# Patient Record
Sex: Female | Born: 1960 | Race: White | Hispanic: No | Smoking: Current every day smoker
Health system: Southern US, Community
[De-identification: ages and names within clinical notes are randomized; demographics above are authoritative.]

## PROBLEM LIST (undated history)

## (undated) DIAGNOSIS — M1990 Unspecified osteoarthritis, unspecified site: Secondary | ICD-10-CM

## (undated) DIAGNOSIS — R0600 Dyspnea, unspecified: Secondary | ICD-10-CM

## (undated) DIAGNOSIS — M25569 Pain in unspecified knee: Secondary | ICD-10-CM

## (undated) DIAGNOSIS — M5416 Radiculopathy, lumbar region: Secondary | ICD-10-CM

## (undated) DIAGNOSIS — F319 Bipolar disorder, unspecified: Secondary | ICD-10-CM

## (undated) DIAGNOSIS — G8929 Other chronic pain: Secondary | ICD-10-CM

## (undated) DIAGNOSIS — F329 Major depressive disorder, single episode, unspecified: Secondary | ICD-10-CM

## (undated) DIAGNOSIS — I10 Essential (primary) hypertension: Secondary | ICD-10-CM

## (undated) DIAGNOSIS — Z862 Personal history of diseases of the blood and blood-forming organs and certain disorders involving the immune mechanism: Secondary | ICD-10-CM

## (undated) DIAGNOSIS — F32A Depression, unspecified: Secondary | ICD-10-CM

## (undated) DIAGNOSIS — M549 Dorsalgia, unspecified: Secondary | ICD-10-CM

## (undated) DIAGNOSIS — K219 Gastro-esophageal reflux disease without esophagitis: Secondary | ICD-10-CM

## (undated) DIAGNOSIS — G4730 Sleep apnea, unspecified: Secondary | ICD-10-CM

## (undated) DIAGNOSIS — F419 Anxiety disorder, unspecified: Secondary | ICD-10-CM

## (undated) DIAGNOSIS — J449 Chronic obstructive pulmonary disease, unspecified: Secondary | ICD-10-CM

## (undated) DIAGNOSIS — Z9981 Dependence on supplemental oxygen: Secondary | ICD-10-CM

## (undated) HISTORY — PX: TUBAL LIGATION: SHX77

## (undated) HISTORY — PX: DILATION AND CURETTAGE OF UTERUS: SHX78

---

## 1998-09-20 ENCOUNTER — Ambulatory Visit (HOSPITAL_COMMUNITY)
Admission: RE | Admit: 1998-09-20 | Discharge: 1998-09-20 | Payer: Self-pay | Admitting: Physical Medicine and Rehabilitation

## 1998-09-20 ENCOUNTER — Encounter: Payer: Self-pay | Admitting: Physical Medicine and Rehabilitation

## 1998-10-12 ENCOUNTER — Encounter
Admission: RE | Admit: 1998-10-12 | Discharge: 1998-12-01 | Payer: Self-pay | Admitting: Physical Medicine and Rehabilitation

## 2000-07-03 ENCOUNTER — Encounter: Admission: RE | Admit: 2000-07-03 | Discharge: 2000-07-03 | Payer: Self-pay | Admitting: Family Medicine

## 2000-07-03 ENCOUNTER — Encounter: Payer: Self-pay | Admitting: Family Medicine

## 2001-05-08 ENCOUNTER — Encounter: Payer: Self-pay | Admitting: Family Medicine

## 2001-05-08 ENCOUNTER — Ambulatory Visit (HOSPITAL_COMMUNITY): Admission: RE | Admit: 2001-05-08 | Discharge: 2001-05-08 | Payer: Self-pay | Admitting: Family Medicine

## 2008-02-15 ENCOUNTER — Emergency Department (HOSPITAL_COMMUNITY): Admission: EM | Admit: 2008-02-15 | Discharge: 2008-02-15 | Payer: Self-pay | Admitting: Emergency Medicine

## 2008-08-29 ENCOUNTER — Ambulatory Visit: Payer: Self-pay | Admitting: *Deleted

## 2008-08-29 ENCOUNTER — Encounter: Payer: Self-pay | Admitting: Emergency Medicine

## 2008-08-30 ENCOUNTER — Inpatient Hospital Stay (HOSPITAL_COMMUNITY): Admission: RE | Admit: 2008-08-30 | Discharge: 2008-08-31 | Payer: Self-pay | Admitting: *Deleted

## 2008-12-01 ENCOUNTER — Emergency Department (HOSPITAL_COMMUNITY): Admission: EM | Admit: 2008-12-01 | Discharge: 2008-12-01 | Payer: Self-pay | Admitting: Emergency Medicine

## 2009-11-14 ENCOUNTER — Other Ambulatory Visit: Admission: RE | Admit: 2009-11-14 | Discharge: 2009-11-14 | Payer: Self-pay | Admitting: Unknown Physician Specialty

## 2010-01-18 ENCOUNTER — Emergency Department (HOSPITAL_COMMUNITY): Admission: EM | Admit: 2010-01-18 | Discharge: 2010-01-19 | Payer: Self-pay | Admitting: Emergency Medicine

## 2010-01-19 ENCOUNTER — Ambulatory Visit: Payer: Self-pay | Admitting: Psychiatry

## 2010-01-19 ENCOUNTER — Inpatient Hospital Stay (HOSPITAL_COMMUNITY): Admission: AD | Admit: 2010-01-19 | Discharge: 2010-01-24 | Payer: Self-pay | Admitting: Psychiatry

## 2010-01-29 ENCOUNTER — Emergency Department (HOSPITAL_COMMUNITY): Admission: EM | Admit: 2010-01-29 | Discharge: 2010-01-29 | Payer: Self-pay | Admitting: Emergency Medicine

## 2010-01-30 ENCOUNTER — Emergency Department (HOSPITAL_COMMUNITY): Admission: EM | Admit: 2010-01-30 | Discharge: 2010-01-30 | Payer: Self-pay | Admitting: Emergency Medicine

## 2010-03-15 ENCOUNTER — Emergency Department (HOSPITAL_COMMUNITY): Admission: EM | Admit: 2010-03-15 | Discharge: 2010-03-15 | Payer: Self-pay | Admitting: Emergency Medicine

## 2010-06-28 ENCOUNTER — Emergency Department (HOSPITAL_COMMUNITY): Admission: EM | Admit: 2010-06-28 | Discharge: 2010-06-28 | Payer: Self-pay | Admitting: Emergency Medicine

## 2010-08-10 ENCOUNTER — Encounter
Admission: RE | Admit: 2010-08-10 | Discharge: 2010-08-10 | Payer: Self-pay | Source: Home / Self Care | Admitting: Physical Medicine and Rehabilitation

## 2010-09-05 ENCOUNTER — Emergency Department (HOSPITAL_COMMUNITY)
Admission: EM | Admit: 2010-09-05 | Discharge: 2010-09-06 | Payer: Self-pay | Source: Home / Self Care | Admitting: Emergency Medicine

## 2010-09-16 ENCOUNTER — Emergency Department (HOSPITAL_COMMUNITY)
Admission: EM | Admit: 2010-09-16 | Discharge: 2010-09-16 | Payer: Self-pay | Source: Home / Self Care | Admitting: Emergency Medicine

## 2010-10-30 ENCOUNTER — Emergency Department (HOSPITAL_COMMUNITY)
Admission: EM | Admit: 2010-10-30 | Discharge: 2010-10-30 | Disposition: A | Discharge status: Home / Self Care | Payer: Medicaid Other | Attending: Emergency Medicine | Admitting: Emergency Medicine

## 2010-10-30 DIAGNOSIS — M545 Low back pain, unspecified: Secondary | ICD-10-CM | POA: Insufficient documentation

## 2010-11-13 ENCOUNTER — Emergency Department (HOSPITAL_COMMUNITY)
Admission: EM | Admit: 2010-11-13 | Discharge: 2010-11-13 | Disposition: A | Discharge status: Home / Self Care | Payer: Medicaid Other | Attending: Emergency Medicine | Admitting: Emergency Medicine

## 2010-11-13 DIAGNOSIS — Z79899 Other long term (current) drug therapy: Secondary | ICD-10-CM | POA: Insufficient documentation

## 2010-11-13 DIAGNOSIS — M545 Low back pain, unspecified: Secondary | ICD-10-CM | POA: Insufficient documentation

## 2010-11-13 DIAGNOSIS — F341 Dysthymic disorder: Secondary | ICD-10-CM | POA: Insufficient documentation

## 2010-11-13 DIAGNOSIS — G8929 Other chronic pain: Secondary | ICD-10-CM | POA: Insufficient documentation

## 2010-11-13 DIAGNOSIS — M79609 Pain in unspecified limb: Secondary | ICD-10-CM | POA: Insufficient documentation

## 2010-11-25 LAB — COMPREHENSIVE METABOLIC PANEL
ALT: 21 U/L (ref 0–35)
AST: 18 U/L (ref 0–37)
Albumin: 4 g/dL (ref 3.5–5.2)
Alkaline Phosphatase: 72 U/L (ref 39–117)
BUN: 9 mg/dL (ref 6–23)
CO2: 29 mEq/L (ref 19–32)
Calcium: 9.4 mg/dL (ref 8.4–10.5)
Chloride: 102 mEq/L (ref 96–112)
Creatinine, Ser: 0.98 mg/dL (ref 0.4–1.2)
GFR calc Af Amer: >60 mL/min (ref >60)
GFR calc non Af Amer: >60 mL/min (ref >60)
Glucose, Bld: 90 mg/dL (ref 70–99)
Potassium: 4.1 mEq/L (ref 3.5–5.1)
Sodium: 137 mEq/L (ref 135–145)
Total Bilirubin: 0.6 mg/dL (ref 0.3–1.2)
Total Protein: 7 g/dL (ref 6.0–8.3)

## 2010-11-25 LAB — URINALYSIS, ROUTINE W REFLEX MICROSCOPIC
Bilirubin Urine: NEGATIVE
Glucose, UA: NEGATIVE mg/dL
Ketones, ur: NEGATIVE mg/dL
Leukocytes, UA: NEGATIVE
Nitrite: POSITIVE — AB
Protein, ur: 30 mg/dL — AB
Specific Gravity, Urine: 1.01 (ref 1.005–1.030)
Urobilinogen, UA: 0.2 mg/dL (ref 0.0–1.0)
pH: 6 (ref 5.0–8.0)

## 2010-11-25 LAB — URINE CULTURE: Colony Count: >=100000

## 2010-11-25 LAB — URINE MICROSCOPIC-ADD ON

## 2010-11-25 LAB — PREGNANCY, URINE: Preg Test, Ur: NEGATIVE

## 2010-11-27 LAB — URINALYSIS, ROUTINE W REFLEX MICROSCOPIC
Bilirubin Urine: NEGATIVE
Glucose, UA: NEGATIVE mg/dL
Hgb urine dipstick: NEGATIVE
Ketones, ur: NEGATIVE mg/dL
Nitrite: NEGATIVE
Protein, ur: NEGATIVE mg/dL
Specific Gravity, Urine: 1.025 (ref 1.005–1.030)
Urobilinogen, UA: 0.2 mg/dL (ref 0.0–1.0)
pH: 5 (ref 5.0–8.0)

## 2010-11-27 LAB — CBC
HCT: 41 % (ref 36.0–46.0)
Hemoglobin: 14.4 g/dL (ref 12.0–15.0)
MCHC: 35.2 g/dL (ref 30.0–36.0)
MCV: 89.8 fL (ref 78.0–100.0)
Platelets: 379 10*3/uL (ref 150–400)
RBC: 4.57 MIL/uL (ref 3.87–5.11)
RDW: 13.8 % (ref 11.5–15.5)
WBC: 11.2 10*3/uL — ABNORMAL HIGH (ref 4.0–10.5)

## 2010-11-27 LAB — DIFFERENTIAL
Basophils Absolute: 0.1 10*3/uL (ref 0.0–0.1)
Basophils Relative: 1 % (ref 0–1)
Eosinophils Absolute: 0.1 10*3/uL (ref 0.0–0.7)
Eosinophils Relative: 1 % (ref 0–5)
Lymphocytes Relative: 35 % (ref 12–46)
Lymphs Abs: 3.9 10*3/uL (ref 0.7–4.0)
Monocytes Absolute: 0.5 10*3/uL (ref 0.1–1.0)
Monocytes Relative: 4 % (ref 3–12)
Neutro Abs: 6.6 10*3/uL (ref 1.7–7.7)
Neutrophils Relative %: 59 % (ref 43–77)

## 2010-11-27 LAB — RAPID URINE DRUG SCREEN, HOSP PERFORMED
Amphetamines: NOT DETECTED
Barbiturates: NOT DETECTED
Benzodiazepines: POSITIVE — AB
Cocaine: POSITIVE — AB
Opiates: NOT DETECTED
Tetrahydrocannabinol: NOT DETECTED

## 2010-11-27 LAB — BASIC METABOLIC PANEL
BUN: 10 mg/dL (ref 6–23)
CO2: 23 mEq/L (ref 19–32)
Calcium: 9.6 mg/dL (ref 8.4–10.5)
Chloride: 108 mEq/L (ref 96–112)
Creatinine, Ser: 0.81 mg/dL (ref 0.4–1.2)
GFR calc Af Amer: >60 mL/min (ref >60)
GFR calc non Af Amer: >60 mL/min (ref >60)
Glucose, Bld: 119 mg/dL — ABNORMAL HIGH (ref 70–99)
Potassium: 3.5 mEq/L (ref 3.5–5.1)
Sodium: 139 mEq/L (ref 135–145)

## 2010-11-27 LAB — ETHANOL: Alcohol, Ethyl (B): 23 mg/dL — ABNORMAL HIGH (ref 0–10)

## 2010-11-27 LAB — POCT PREGNANCY, URINE: Preg Test, Ur: NEGATIVE
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
## 2011-01-29 ENCOUNTER — Emergency Department (HOSPITAL_COMMUNITY)
Admission: EM | Admit: 2011-01-29 | Discharge: 2011-01-29 | Disposition: A | Discharge status: Home / Self Care | Payer: Medicaid Other | Attending: Emergency Medicine | Admitting: Emergency Medicine

## 2011-01-29 DIAGNOSIS — R110 Nausea: Secondary | ICD-10-CM | POA: Insufficient documentation

## 2011-01-29 DIAGNOSIS — R109 Unspecified abdominal pain: Secondary | ICD-10-CM | POA: Insufficient documentation

## 2011-01-29 DIAGNOSIS — N390 Urinary tract infection, site not specified: Secondary | ICD-10-CM | POA: Insufficient documentation

## 2011-01-29 DIAGNOSIS — R51 Headache: Secondary | ICD-10-CM | POA: Insufficient documentation

## 2011-01-29 LAB — URINALYSIS, ROUTINE W REFLEX MICROSCOPIC
Bilirubin Urine: NEGATIVE
Glucose, UA: NEGATIVE mg/dL
Hgb urine dipstick: NEGATIVE
Leukocytes, UA: NEGATIVE
Nitrite: POSITIVE — AB
Protein, ur: NEGATIVE mg/dL
Specific Gravity, Urine: 1.02 (ref 1.005–1.030)
Urobilinogen, UA: 0.2 mg/dL (ref 0.0–1.0)
pH: 6 (ref 5.0–8.0)

## 2011-01-29 LAB — CBC
HCT: 39.8 % (ref 36.0–46.0)
Hemoglobin: 13.3 g/dL (ref 12.0–15.0)
MCH: 30 pg (ref 26.0–34.0)
MCHC: 33.4 g/dL (ref 30.0–36.0)
MCV: 89.8 fL (ref 78.0–100.0)
Platelets: 278 10*3/uL (ref 150–400)
RBC: 4.43 MIL/uL (ref 3.87–5.11)
RDW: 13.8 % (ref 11.5–15.5)
WBC: 9.6 10*3/uL (ref 4.0–10.5)

## 2011-01-29 LAB — COMPREHENSIVE METABOLIC PANEL
ALT: 23 U/L (ref 0–35)
AST: 17 U/L (ref 0–37)
Albumin: 3.8 g/dL (ref 3.5–5.2)
Alkaline Phosphatase: 79 U/L (ref 39–117)
BUN: 13 mg/dL (ref 6–23)
CO2: 29 mEq/L (ref 19–32)
Calcium: 10 mg/dL (ref 8.4–10.5)
Chloride: 100 mEq/L (ref 96–112)
Creatinine, Ser: 0.83 mg/dL (ref 0.4–1.2)
GFR calc Af Amer: >60 mL/min (ref >60)
GFR calc non Af Amer: >60 mL/min (ref >60)
Glucose, Bld: 86 mg/dL (ref 70–99)
Potassium: 3.6 mEq/L (ref 3.5–5.1)
Sodium: 137 mEq/L (ref 135–145)
Total Bilirubin: 0.2 mg/dL — ABNORMAL LOW (ref 0.3–1.2)
Total Protein: 6.9 g/dL (ref 6.0–8.3)

## 2011-01-29 LAB — DIFFERENTIAL
Basophils Absolute: 0 10*3/uL (ref 0.0–0.1)
Basophils Relative: 0 % (ref 0–1)
Eosinophils Absolute: 0.1 10*3/uL (ref 0.0–0.7)
Eosinophils Relative: 1 % (ref 0–5)
Lymphocytes Relative: 29 % (ref 12–46)
Lymphs Abs: 2.7 10*3/uL (ref 0.7–4.0)
Monocytes Absolute: 0.6 10*3/uL (ref 0.1–1.0)
Monocytes Relative: 7 % (ref 3–12)
Neutro Abs: 6.1 10*3/uL (ref 1.7–7.7)
Neutrophils Relative %: 64 % (ref 43–77)

## 2011-01-29 LAB — URINE MICROSCOPIC-ADD ON

## 2011-01-31 LAB — URINE CULTURE
Colony Count: 80000
Culture  Setup Time: 201205230623

## 2011-04-17 ENCOUNTER — Emergency Department (HOSPITAL_COMMUNITY)
Admission: EM | Admit: 2011-04-17 | Discharge: 2011-04-17 | Disposition: A | Discharge status: Home / Self Care | Payer: Medicaid Other | Attending: Emergency Medicine | Admitting: Emergency Medicine

## 2011-04-17 ENCOUNTER — Emergency Department (HOSPITAL_COMMUNITY): Payer: Medicaid Other

## 2011-04-17 ENCOUNTER — Encounter: Payer: Self-pay | Admitting: Emergency Medicine

## 2011-04-17 DIAGNOSIS — Z79899 Other long term (current) drug therapy: Secondary | ICD-10-CM | POA: Insufficient documentation

## 2011-04-17 DIAGNOSIS — F319 Bipolar disorder, unspecified: Secondary | ICD-10-CM | POA: Insufficient documentation

## 2011-04-17 DIAGNOSIS — I10 Essential (primary) hypertension: Secondary | ICD-10-CM | POA: Insufficient documentation

## 2011-04-17 DIAGNOSIS — M545 Low back pain, unspecified: Secondary | ICD-10-CM | POA: Insufficient documentation

## 2011-04-17 DIAGNOSIS — N3946 Mixed incontinence: Secondary | ICD-10-CM | POA: Insufficient documentation

## 2011-04-17 DIAGNOSIS — F341 Dysthymic disorder: Secondary | ICD-10-CM | POA: Insufficient documentation

## 2011-04-17 DIAGNOSIS — R209 Unspecified disturbances of skin sensation: Secondary | ICD-10-CM | POA: Insufficient documentation

## 2011-04-17 DIAGNOSIS — F17200 Nicotine dependence, unspecified, uncomplicated: Secondary | ICD-10-CM | POA: Insufficient documentation

## 2011-04-17 HISTORY — DX: Essential (primary) hypertension: I10

## 2011-04-17 HISTORY — DX: Anxiety disorder, unspecified: F41.9

## 2011-04-17 HISTORY — DX: Other chronic pain: G89.29

## 2011-04-17 HISTORY — DX: Dorsalgia, unspecified: M54.9

## 2011-04-17 HISTORY — DX: Bipolar disorder, unspecified: F31.9

## 2011-04-17 HISTORY — DX: Depression, unspecified: F32.A

## 2011-04-17 HISTORY — DX: Major depressive disorder, single episode, unspecified: F32.9

## 2011-04-17 LAB — URINALYSIS, ROUTINE W REFLEX MICROSCOPIC
Bilirubin Urine: NEGATIVE
Glucose, UA: NEGATIVE mg/dL
Hgb urine dipstick: NEGATIVE
Ketones, ur: NEGATIVE mg/dL
Leukocytes, UA: NEGATIVE
Nitrite: NEGATIVE
Protein, ur: NEGATIVE mg/dL
Specific Gravity, Urine: 1.01 (ref 1.005–1.030)
Urobilinogen, UA: 0.2 mg/dL (ref 0.0–1.0)
pH: 5.5 (ref 5.0–8.0)

## 2011-04-17 MED ORDER — METHOCARBAMOL 500 MG PO TABS: 500.0000 mg | ORAL_TABLET | Freq: Three times a day (TID) | ORAL | Status: AC

## 2011-04-17 MED ORDER — OXYCODONE-ACETAMINOPHEN 5-325 MG PO TABS
1.0000 | ORAL_TABLET | Freq: Once | ORAL | Status: AC
  Administered 2011-04-17: 1 via ORAL
  Filled 2011-04-17: qty 1

## 2011-04-17 MED ORDER — TRAMADOL HCL 50 MG PO TABS: ORAL_TABLET | ORAL | Status: DC

## 2011-04-17 NOTE — ED Notes (Signed)
Pt c/o lower back pain x 2 years. Pt here for pain control. Unable to see Dr. Gerilyn Pilgrim until Tuesday. nad noted.

## 2011-04-17 NOTE — ED Provider Notes (Signed)
History     CSN: [id number] Arrival date & time: 04/17/2011  3:56 PM  Chief Complaint  Patient presents with  . Back Pain   HPI Comments: Patient c/o chronic low back pain for several years.  States the pain has been increasing this week and she recently ran out of her pain medications.  States she has also been having episodes in bladder incontinence.  States she wakes in the early morning hrs with urge to void and sometimes is unable to get to the restroom in time.  She denies these sx's during the day.  Also denies recent injury, bowel incontinence or perineal numbness.    Patient is a 50 y.o. female presenting with back pain. The history is provided by the patient.  Back Pain  This is a chronic problem. The current episode started more than 1 week ago. The problem occurs constantly. The problem has been gradually worsening. The pain is associated with no known injury. The pain is present in the lumbar spine. The quality of the pain is described as aching. The pain does not radiate. The pain is moderate. The symptoms are aggravated by twisting and certain positions. The pain is the same all the time. Associated symptoms include numbness, bowel incontinence, bladder incontinence and tingling. Pertinent negatives include no chest pain, no fever, no abdominal pain, no abdominal swelling, no perianal numbness, no dysuria, no pelvic pain, no leg pain and no weakness. She has tried analgesics for the symptoms. The treatment provided mild relief.    Past Medical History  Diagnosis Date  . Chronic back pain   . Bipolar 1 disorder   . Anxiety   . Depression   . Hypertension     History reviewed. No pertinent past surgical history.  Family History  Problem Relation Age of Onset  . Hypertension Mother   . Hypertension Brother   . Anxiety disorder Brother   . Depression Brother     History  Substance Use Topics  . Smoking status: Current Everyday Smoker -- 0.5 packs/day  . Smokeless  tobacco: Not on file  . Alcohol Use: No    OB History    Grav Para Term Preterm Abortions TAB SAB Ect Mult Living   2 2 2       2       Review of Systems  Constitutional: Negative for fever.  HENT: Negative for neck pain and neck stiffness.   Cardiovascular: Negative for chest pain.  Gastrointestinal: Positive for bowel incontinence. Negative for abdominal pain.  Genitourinary: Positive for bladder incontinence. Negative for dysuria, decreased urine volume, vaginal pain and pelvic pain.  Musculoskeletal: Positive for back pain. Negative for myalgias and gait problem.  Skin: Negative for color change and wound.  Neurological: Positive for tingling and numbness. Negative for dizziness and weakness.  Hematological: Does not bruise/bleed easily.  All other systems reviewed and are negative.    Physical Exam  BP 157/91  Pulse 88  Temp 97.9 F (36.6 C)  Resp 18  Ht 5\' 3"  (1.6 m)  Wt 225 lb (102.059 kg)  BMI 39.86 kg/m2  SpO2 99%  LMP 04/10/2011  Physical Exam  Constitutional: She is oriented to person, place, and time. She appears well-developed and well-nourished. No distress.  HENT:  Head: Normocephalic and atraumatic.  Mouth/Throat: Oropharynx is clear and moist.  Eyes: Pupils are equal, round, and reactive to light.  Neck: Normal range of motion. Neck supple. No thyromegaly present.  Cardiovascular: Normal rate, regular rhythm and normal  heart sounds.   Pulmonary/Chest: Effort normal and breath sounds normal.  Abdominal: Soft. There is no tenderness. There is no rebound and no guarding.  Genitourinary: Rectal exam shows no mass, no tenderness and anal tone normal.  Musculoskeletal: She exhibits tenderness. She exhibits no edema.  Lymphadenopathy:    She has no cervical adenopathy.  Neurological: She is alert and oriented to person, place, and time. She has normal reflexes. She is not disoriented. She displays no atrophy and normal reflexes. No cranial nerve deficit or  sensory deficit. She exhibits normal muscle tone. Coordination and gait normal. GCS eye subscore is 4. GCS verbal subscore is 5. GCS motor subscore is 6.  Skin: Skin is warm and dry.    ED Course  Procedures  MDM   1840  Patient ambulated to the restroom w/o difficulty.  No focal neuro deficits on exam.  DTR's are nml. Rectal performed by me and perineal sensation intact, rectal tone is normal. ttp of the lumbar spine and  paraspinal muscles.  Has hx of chronic low back pain and has an appt with neurologist in 6 days.      Brook Geraci L. [place], [place] 04/22/11 2016

## 2011-05-01 NOTE — ED Provider Notes (Signed)
Medical screening examination/treatment/procedure(s) were performed by non-physician practitioner and as supervising physician I was immediately available for consultation/collaboration.  Jasmine Awe, MD 05/01/11 [phone]

## 2011-05-05 ENCOUNTER — Ambulatory Visit: Payer: Medicaid Other | Attending: Neurology

## 2011-05-05 DIAGNOSIS — Z6839 Body mass index (BMI) 39.0-39.9, adult: Secondary | ICD-10-CM | POA: Insufficient documentation

## 2011-05-05 DIAGNOSIS — G4733 Obstructive sleep apnea (adult) (pediatric): Secondary | ICD-10-CM | POA: Insufficient documentation

## 2011-05-05 NOTE — Progress Notes (Signed)
Subjective:     Patient ID: Tiffany Lynch, female   DOB: Jan 16, 1961, 50 y.o.   MRN: [id number]  HPI   Review of Systems     Objective:   Physical Exam     Assessment:     ***    Plan:     ***

## 2011-05-13 NOTE — Procedures (Signed)
Tiffany Lynch, Tiffany Lynch                ACCOUNT NO.:  [id number]  MEDICAL RECORD NO.:  [id number]          PATIENT TYPE:  OUT  LOCATION:  SLEEP LAB                     FACILITY:  [facility]  PHYSICIAN:  Javin Nong A. Gerilyn Pilgrim, M.D. DATE OF BIRTH:  [birth year]  DATE OF STUDY:  05/05/2011                           NOCTURNAL POLYSOMNOGRAM   REFERRING PHYSICIAN:  Beryle Beams, MD.  INDICATIONS:  50 year old presents with hypersomnia, fatigue and snoring.  This study has been done to evaluate for obstructive sleep apnea syndrome.  MEDICATIONS:  Invega, Wellbutrin, Cymbalta, Ultram, BuSpar, Xanax, ibuprofen, lisinopril, hydrochlorothiazide, Voltaren, Neurontin, trazodone.  EPWORTH SLEEPINESS SCORE:  16.  BMI:  39.  ARCHITECTURAL SUMMARY:  Total recording time is 497 minutes.  Sleep efficiency 80%.  Sleep latency 0.5 minutes.  REM latency zero.  Stage N1 10%, N2 90%, N3  0% and REM sleep 0%.  RESPIRATORY SUMMARY:  Baseline oxygen saturation 92, lowest saturation 71 during non REM sleep.  Diagnostic AH I 87, RDI 30.  The patient was started on oxygen because of none obstructing non-apneic desaturations. She was given 1.5 liters during the first hour of the recording.  LIMB MOVEMENT SUMMARY:  PLM index 5.6.  ELECTROCARDIOGRAM SUMMARY:  Average heart rate is 87 with no significant dysrhythmias observed.  IMPRESSION: 1. Mild obstructive sleep apnea syndrome not requiring positive     pressure. 2. Significant oxygen desaturations without obstructive events     suggestive underlying pulmonary process.  RECOMMENDATIONS:  Two liters nocturnal supplemental oxygen.     Yanisa Goodgame A. Gerilyn Pilgrim, M.D.    KAD/MEDQ  D:  05/13/2011 20:54:02  T:  05/13/2011 21:23:14  Job:  [id number]

## 2011-06-14 LAB — BASIC METABOLIC PANEL
BUN: 8 mg/dL (ref 6–23)
CO2: 25 mEq/L (ref 19–32)
Calcium: 9.1 mg/dL (ref 8.4–10.5)
Chloride: 107 mEq/L (ref 96–112)
Creatinine, Ser: 0.69 mg/dL (ref 0.4–1.2)
GFR calc Af Amer: >60 mL/min (ref >60)
GFR calc non Af Amer: >60 mL/min (ref >60)
Glucose, Bld: 98 mg/dL (ref 70–99)
Potassium: 3.8 mEq/L (ref 3.5–5.1)
Sodium: 140 mEq/L (ref 135–145)

## 2011-06-14 LAB — RAPID URINE DRUG SCREEN, HOSP PERFORMED
Amphetamines: NOT DETECTED
Barbiturates: NOT DETECTED
Benzodiazepines: POSITIVE — AB
Cocaine: NOT DETECTED
Opiates: NOT DETECTED
Tetrahydrocannabinol: POSITIVE — AB

## 2011-06-14 LAB — DIFFERENTIAL
Basophils Absolute: 0 10*3/uL (ref 0.0–0.1)
Basophils Relative: 0 % (ref 0–1)
Eosinophils Absolute: 0 10*3/uL (ref 0.0–0.7)
Eosinophils Relative: 1 % (ref 0–5)
Lymphocytes Relative: 23 % (ref 12–46)
Lymphs Abs: 2.2 10*3/uL (ref 0.7–4.0)
Monocytes Absolute: 0.5 10*3/uL (ref 0.1–1.0)
Monocytes Relative: 6 % (ref 3–12)
Neutro Abs: 7 10*3/uL (ref 1.7–7.7)
Neutrophils Relative %: 71 % (ref 43–77)

## 2011-06-14 LAB — CBC
HCT: 42.8 % (ref 36.0–46.0)
Hemoglobin: 14.2 g/dL (ref 12.0–15.0)
MCHC: 33.3 g/dL (ref 30.0–36.0)
MCV: 94.7 fL (ref 78.0–100.0)
Platelets: 359 10*3/uL (ref 150–400)
RBC: 4.52 MIL/uL (ref 3.87–5.11)
RDW: 14.3 % (ref 11.5–15.5)
WBC: 9.9 10*3/uL (ref 4.0–10.5)

## 2011-06-14 LAB — ETHANOL: Alcohol, Ethyl (B): <5 mg/dL (ref 0–10)

## 2011-07-24 ENCOUNTER — Other Ambulatory Visit: Payer: Self-pay | Admitting: Neurosurgery

## 2011-07-24 ENCOUNTER — Other Ambulatory Visit (HOSPITAL_COMMUNITY): Payer: Self-pay | Admitting: Family Medicine

## 2011-07-24 DIAGNOSIS — Z139 Encounter for screening, unspecified: Secondary | ICD-10-CM

## 2011-07-24 DIAGNOSIS — M549 Dorsalgia, unspecified: Secondary | ICD-10-CM

## 2011-07-29 ENCOUNTER — Ambulatory Visit (HOSPITAL_COMMUNITY): Payer: Medicaid Other

## 2011-07-30 ENCOUNTER — Ambulatory Visit (HOSPITAL_COMMUNITY)
Admission: RE | Admit: 2011-07-30 | Discharge: 2011-07-30 | Disposition: A | Discharge status: Home / Self Care | Payer: Medicaid Other | Source: Ambulatory Visit | Attending: Family Medicine | Admitting: Family Medicine

## 2011-07-30 DIAGNOSIS — Z1231 Encounter for screening mammogram for malignant neoplasm of breast: Secondary | ICD-10-CM | POA: Insufficient documentation

## 2011-07-30 DIAGNOSIS — Z139 Encounter for screening, unspecified: Secondary | ICD-10-CM

## 2011-08-07 ENCOUNTER — Ambulatory Visit
Admission: RE | Admit: 2011-08-07 | Discharge: 2011-08-07 | Disposition: A | Discharge status: Home / Self Care | Payer: Medicaid Other | Source: Ambulatory Visit | Attending: Neurosurgery | Admitting: Neurosurgery

## 2011-08-07 DIAGNOSIS — M549 Dorsalgia, unspecified: Secondary | ICD-10-CM

## 2011-08-12 ENCOUNTER — Other Ambulatory Visit: Payer: Self-pay | Admitting: Family Medicine

## 2011-08-12 DIAGNOSIS — R928 Other abnormal and inconclusive findings on diagnostic imaging of breast: Secondary | ICD-10-CM

## 2011-08-28 ENCOUNTER — Ambulatory Visit (HOSPITAL_COMMUNITY): Payer: Medicaid Other

## 2011-09-04 ENCOUNTER — Encounter (HOSPITAL_COMMUNITY): Payer: Medicaid Other

## 2011-09-18 ENCOUNTER — Other Ambulatory Visit: Payer: Self-pay | Admitting: Family Medicine

## 2011-09-18 ENCOUNTER — Ambulatory Visit (HOSPITAL_COMMUNITY)
Admission: RE | Admit: 2011-09-18 | Discharge: 2011-09-18 | Disposition: A | Discharge status: Home / Self Care | Payer: Medicaid Other | Source: Ambulatory Visit | Attending: Family Medicine | Admitting: Family Medicine

## 2011-09-18 DIAGNOSIS — R928 Other abnormal and inconclusive findings on diagnostic imaging of breast: Secondary | ICD-10-CM

## 2011-09-18 DIAGNOSIS — L723 Sebaceous cyst: Secondary | ICD-10-CM | POA: Insufficient documentation

## 2011-11-29 ENCOUNTER — Emergency Department (HOSPITAL_COMMUNITY)
Admission: EM | Admit: 2011-11-29 | Discharge: 2011-11-30 | Disposition: A | Discharge status: Home / Self Care | Payer: Medicaid Other | Attending: Emergency Medicine | Admitting: Emergency Medicine

## 2011-11-29 ENCOUNTER — Encounter (HOSPITAL_COMMUNITY): Payer: Self-pay | Admitting: *Deleted

## 2011-11-29 DIAGNOSIS — R296 Repeated falls: Secondary | ICD-10-CM | POA: Insufficient documentation

## 2011-11-29 DIAGNOSIS — M542 Cervicalgia: Secondary | ICD-10-CM | POA: Insufficient documentation

## 2011-11-29 DIAGNOSIS — I10 Essential (primary) hypertension: Secondary | ICD-10-CM | POA: Insufficient documentation

## 2011-11-29 DIAGNOSIS — S139XXA Sprain of joints and ligaments of unspecified parts of neck, initial encounter: Secondary | ICD-10-CM | POA: Insufficient documentation

## 2011-11-29 DIAGNOSIS — F17200 Nicotine dependence, unspecified, uncomplicated: Secondary | ICD-10-CM | POA: Insufficient documentation

## 2011-11-29 DIAGNOSIS — Z79899 Other long term (current) drug therapy: Secondary | ICD-10-CM | POA: Insufficient documentation

## 2011-11-29 DIAGNOSIS — S161XXA Strain of muscle, fascia and tendon at neck level, initial encounter: Secondary | ICD-10-CM

## 2011-11-29 DIAGNOSIS — S60219A Contusion of unspecified wrist, initial encounter: Secondary | ICD-10-CM | POA: Insufficient documentation

## 2011-11-29 DIAGNOSIS — F3130 Bipolar disorder, current episode depressed, mild or moderate severity, unspecified: Secondary | ICD-10-CM | POA: Insufficient documentation
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
## 2011-11-30 ENCOUNTER — Emergency Department (HOSPITAL_COMMUNITY): Payer: Medicaid Other

## 2011-11-30 MED ORDER — ACETAMINOPHEN 325 MG PO TABS
650.0000 mg | ORAL_TABLET | Freq: Once | ORAL | Status: AC
  Administered 2011-11-30: 650 mg via ORAL
  Filled 2011-11-30: qty 2
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
## 2011-11-30 NOTE — Discharge Instructions (Signed)
Cervical Strain Care After A cervical strain is when the muscles and ligaments in your neck have been stretched. The bones are not broken. If you had any problems moving your arms or legs immediately after the injury, even if the problem has gone away, make sure to tell this to your caregiver.  HOME CARE INSTRUCTIONS   While awake, apply ice packs to the neck or areas of pain about every 1 to 2 hours, for 15 to 20 minutes at a time. Do this for 2 days. If you were given a cervical collar for support, ask your caregiver if you may remove it for bathing or applying ice.   If given a cervical collar, wear as instructed. Do not remove any collar unless instructed by a caregiver.   Only take over-the-counter or prescription medicines for pain, discomfort, or fever as directed by your caregiver.  Recheck with the hospital or clinic after a radiologist has read your X-rays. Recheck with the hospital or clinic to make sure the initial readings are correct. Do this also to determine if you need further studies. It is your responsibility to find out your X-ray results. X-rays are sometimes repeated in one week to ten days. These are often repeated to make sure that a hairline fracture was not overlooked. Ask your caregiver how you are to find out about your radiology (X-ray) results. SEEK IMMEDIATE MEDICAL CARE IF:   You have increasing pain in your neck.   You develop difficulties swallowing or breathing.   You have numbness, weakness, or movement problems in the arms or legs.   You have difficulty walking.   You develop bowel or bladder retention or incontinence.   You have problems with walking.  MAKE SURE YOU:   Understand these instructions.   Will watch your condition.   Will get help right away if you are not doing well or get worse.  Document Released: 08/26/2005 Document Revised: 05/08/2011 Document Reviewed: 04/08/2008 [facility] Patient Information 2012 [place], [place].Contusion A  contusion is a deep bruise. Contusions are the result of an injury that caused bleeding under the skin. The contusion may turn blue, purple, or yellow. Minor injuries will give you a painless contusion, but more severe contusions may stay painful and swollen for a few weeks.  CAUSES  A contusion is usually caused by a blow, trauma, or direct force to an area of the body. SYMPTOMS   Swelling and redness of the injured area.   Bruising of the injured area.   Tenderness and soreness of the injured area.   Pain.  DIAGNOSIS  The diagnosis can be made by taking a history and physical exam. An X-ray, CT scan, or MRI may be needed to determine if there were any associated injuries, such as fractures. TREATMENT  Specific treatment will depend on what area of the body was injured. In general, the best treatment for a contusion is resting, icing, elevating, and applying cold compresses to the injured area. Over-the-counter medicines may also be recommended for pain control. Ask your caregiver what the best treatment is for your contusion. HOME CARE INSTRUCTIONS   Put ice on the injured area.   Put ice in a plastic bag.   Place a towel between your skin and the bag.   Leave the ice on for 15 to 20 minutes, 3 to 4 times a day.   Only take over-the-counter or prescription medicines for pain, discomfort, or fever as directed by your caregiver. Your caregiver may recommend avoiding  anti-inflammatory medicines (aspirin, ibuprofen, and naproxen) for 48 hours because these medicines may increase bruising.   Rest the injured area.   If possible, elevate the injured area to reduce swelling.  SEEK IMMEDIATE MEDICAL CARE IF:   You have increased bruising or swelling.   You have pain that is getting worse.   Your swelling or pain is not relieved with medicines.  MAKE SURE YOU:   Understand these instructions.   Will watch your condition.   Will get help right away if you are not doing well or get  worse.  Document Released: 06/05/2005 Document Revised: 08/15/2011 Document Reviewed: 07/01/2011 [facility] Patient Information 2012 [place], [place].    Your xrays are normal today.  Expect to be more sore tomorrow morning and again the next,  Then you should get gradual improvement in your symptoms over the next 7-10 days.  Get rechecked if not improved by then.  Use your home pain medications  if needed for achiness.  Continue using an ice pack to your wrists - 10 minutes every hour while awake for the next day.

## 2011-12-15 NOTE — ED Provider Notes (Signed)
Evaluation and management procedures were performed by the PA/NP/Resident Physician under my supervision/collaboration.   Felisa Bonier, MD 12/15/11 [phone]

## 2012-03-30 ENCOUNTER — Emergency Department (HOSPITAL_COMMUNITY): Payer: Medicaid Other

## 2012-03-30 ENCOUNTER — Inpatient Hospital Stay (HOSPITAL_COMMUNITY)
Admission: EM | Admit: 2012-03-30 | Discharge: 2012-04-01 | DRG: 690 | Disposition: A | Discharge status: Home / Self Care | Payer: Medicaid Other | Attending: Internal Medicine | Admitting: Internal Medicine

## 2012-03-30 ENCOUNTER — Encounter (HOSPITAL_COMMUNITY): Payer: Self-pay | Admitting: *Deleted

## 2012-03-30 DIAGNOSIS — R112 Nausea with vomiting, unspecified: Secondary | ICD-10-CM | POA: Diagnosis present

## 2012-03-30 DIAGNOSIS — F319 Bipolar disorder, unspecified: Secondary | ICD-10-CM

## 2012-03-30 DIAGNOSIS — F3130 Bipolar disorder, current episode depressed, mild or moderate severity, unspecified: Secondary | ICD-10-CM | POA: Diagnosis present

## 2012-03-30 DIAGNOSIS — F411 Generalized anxiety disorder: Secondary | ICD-10-CM

## 2012-03-30 DIAGNOSIS — R4182 Altered mental status, unspecified: Secondary | ICD-10-CM

## 2012-03-30 DIAGNOSIS — G9340 Encephalopathy, unspecified: Secondary | ICD-10-CM | POA: Diagnosis present

## 2012-03-30 DIAGNOSIS — F32A Depression, unspecified: Secondary | ICD-10-CM | POA: Diagnosis present

## 2012-03-30 DIAGNOSIS — F329 Major depressive disorder, single episode, unspecified: Secondary | ICD-10-CM | POA: Diagnosis present

## 2012-03-30 DIAGNOSIS — I10 Essential (primary) hypertension: Secondary | ICD-10-CM | POA: Diagnosis present

## 2012-03-30 DIAGNOSIS — E860 Dehydration: Secondary | ICD-10-CM | POA: Diagnosis present

## 2012-03-30 DIAGNOSIS — N390 Urinary tract infection, site not specified: Principal | ICD-10-CM

## 2012-03-30 DIAGNOSIS — G8929 Other chronic pain: Secondary | ICD-10-CM

## 2012-03-30 DIAGNOSIS — F17200 Nicotine dependence, unspecified, uncomplicated: Secondary | ICD-10-CM | POA: Diagnosis present

## 2012-03-30 DIAGNOSIS — F419 Anxiety disorder, unspecified: Secondary | ICD-10-CM | POA: Diagnosis present

## 2012-03-30 DIAGNOSIS — M549 Dorsalgia, unspecified: Secondary | ICD-10-CM

## 2012-03-30 DIAGNOSIS — N938 Other specified abnormal uterine and vaginal bleeding: Secondary | ICD-10-CM

## 2012-03-30 DIAGNOSIS — E871 Hypo-osmolality and hyponatremia: Secondary | ICD-10-CM

## 2012-03-30 DIAGNOSIS — I951 Orthostatic hypotension: Secondary | ICD-10-CM | POA: Diagnosis present

## 2012-03-30 LAB — RAPID URINE DRUG SCREEN, HOSP PERFORMED
Amphetamines: NOT DETECTED
Barbiturates: NOT DETECTED
Benzodiazepines: POSITIVE — AB
Cocaine: NOT DETECTED
Opiates: NOT DETECTED
Tetrahydrocannabinol: NOT DETECTED

## 2012-03-30 LAB — URINALYSIS, ROUTINE W REFLEX MICROSCOPIC
Bilirubin Urine: NEGATIVE
Glucose, UA: NEGATIVE mg/dL
Ketones, ur: NEGATIVE mg/dL
Nitrite: NEGATIVE
Protein, ur: NEGATIVE mg/dL
Specific Gravity, Urine: 1.015 (ref 1.005–1.030)
Urobilinogen, UA: 0.2 mg/dL (ref 0.0–1.0)
pH: 5.5 (ref 5.0–8.0)

## 2012-03-30 LAB — CBC WITH DIFFERENTIAL/PLATELET
Basophils Absolute: 0 10*3/uL (ref 0.0–0.1)
Basophils Relative: 0 % (ref 0–1)
Eosinophils Absolute: 0 10*3/uL (ref 0.0–0.7)
Eosinophils Relative: 0 % (ref 0–5)
HCT: 36.1 % (ref 36.0–46.0)
Hemoglobin: 12.1 g/dL (ref 12.0–15.0)
Lymphocytes Relative: 17 % (ref 12–46)
Lymphs Abs: 2.3 10*3/uL (ref 0.7–4.0)
MCH: 30 pg (ref 26.0–34.0)
MCHC: 33.5 g/dL (ref 30.0–36.0)
MCV: 89.4 fL (ref 78.0–100.0)
Monocytes Absolute: 1.6 10*3/uL — ABNORMAL HIGH (ref 0.1–1.0)
Monocytes Relative: 12 % (ref 3–12)
Neutro Abs: 9.2 10*3/uL — ABNORMAL HIGH (ref 1.7–7.7)
Neutrophils Relative %: 70 % (ref 43–77)
Platelets: 274 10*3/uL (ref 150–400)
RBC: 4.04 MIL/uL (ref 3.87–5.11)
RDW: 14.8 % (ref 11.5–15.5)
WBC: 13.1 10*3/uL — ABNORMAL HIGH (ref 4.0–10.5)

## 2012-03-30 LAB — URINE MICROSCOPIC-ADD ON

## 2012-03-30 LAB — COMPREHENSIVE METABOLIC PANEL
ALT: 32 U/L (ref 0–35)
AST: 35 U/L (ref 0–37)
Albumin: 3.2 g/dL — ABNORMAL LOW (ref 3.5–5.2)
Alkaline Phosphatase: 105 U/L (ref 39–117)
BUN: 12 mg/dL (ref 6–23)
CO2: 30 mEq/L (ref 19–32)
Calcium: 9.6 mg/dL (ref 8.4–10.5)
Chloride: 89 mEq/L — ABNORMAL LOW (ref 96–112)
Creatinine, Ser: 1.18 mg/dL — ABNORMAL HIGH (ref 0.50–1.10)
GFR calc Af Amer: 61 mL/min — ABNORMAL LOW (ref >90)
GFR calc non Af Amer: 52 mL/min — ABNORMAL LOW (ref >90)
Glucose, Bld: 108 mg/dL — ABNORMAL HIGH (ref 70–99)
Potassium: 3.3 mEq/L — ABNORMAL LOW (ref 3.5–5.1)
Sodium: 129 mEq/L — ABNORMAL LOW (ref 135–145)
Total Bilirubin: 0.7 mg/dL (ref 0.3–1.2)
Total Protein: 7.3 g/dL (ref 6.0–8.3)

## 2012-03-30 LAB — BLOOD GAS, ARTERIAL
Acid-Base Excess: 2.4 mmol/L — ABNORMAL HIGH (ref 0.0–2.0)
Bicarbonate: 27.1 mEq/L — ABNORMAL HIGH (ref 20.0–24.0)
Drawn by: 21310
O2 Content: 4 L/min
O2 Saturation: 93.7 %
TCO2: 23.6 mmol/L (ref 0–100)
pCO2 arterial: 47.3 mmHg — ABNORMAL HIGH (ref 35.0–45.0)
pH, Arterial: 7.377 (ref 7.350–7.450)
pO2, Arterial: 75.1 mmHg — ABNORMAL LOW (ref 80.0–100.0)

## 2012-03-30 LAB — ACETAMINOPHEN LEVEL: Acetaminophen (Tylenol), Serum: <15 ug/mL (ref 10–30)

## 2012-03-30 LAB — LIPASE, BLOOD: Lipase: 11 U/L (ref 11–59)

## 2012-03-30 LAB — AMMONIA: Ammonia: 24 umol/L (ref 11–60)

## 2012-03-30 LAB — POCT PREGNANCY, URINE: Preg Test, Ur: NEGATIVE

## 2012-03-30 LAB — LACTIC ACID, PLASMA: Lactic Acid, Venous: 0.7 mmol/L (ref 0.5–2.2)

## 2012-03-30 LAB — SALICYLATE LEVEL: Salicylate Lvl: <2 mg/dL — ABNORMAL LOW (ref 2.8–20.0)

## 2012-03-30 MED ORDER — ACETAMINOPHEN 500 MG PO TABS
ORAL_TABLET | ORAL | Status: AC
  Filled 2012-03-30: qty 1

## 2012-03-30 MED ORDER — DEXTROSE 5 % IV SOLN
1.0000 g | Freq: Once | INTRAVENOUS | Status: AC
  Administered 2012-03-30: 1 g via INTRAVENOUS
  Filled 2012-03-30: qty 10

## 2012-03-30 MED ORDER — SODIUM CHLORIDE 0.9 % IV BOLUS (SEPSIS): 1000.0000 mL | Freq: Once | INTRAVENOUS | Status: DC

## 2012-03-30 MED ORDER — SODIUM CHLORIDE 0.9 % IV BOLUS (SEPSIS)
1000.0000 mL | Freq: Once | INTRAVENOUS | Status: AC
  Administered 2012-03-30: 1000 mL via INTRAVENOUS

## 2012-03-30 MED ORDER — ACETAMINOPHEN 325 MG PO TABS
650.0000 mg | ORAL_TABLET | Freq: Once | ORAL | Status: DC
  Administered 2012-03-30: 1000 mg via ORAL
  Filled 2012-03-30: qty 2

## 2012-03-30 MED ORDER — NALOXONE HCL 0.4 MG/ML IJ SOLN
0.4000 mg | Freq: Once | INTRAMUSCULAR | Status: AC
  Administered 2012-03-30: 0.4 mg via INTRAVENOUS

## 2012-03-30 MED ORDER — NALOXONE HCL 0.4 MG/ML IJ SOLN
INTRAMUSCULAR | Status: AC
  Filled 2012-03-30: qty 1

## 2012-03-30 MED ORDER — ONDANSETRON HCL 4 MG/2ML IJ SOLN
4.0000 mg | Freq: Once | INTRAMUSCULAR | Status: AC
  Administered 2012-03-30: 4 mg via INTRAVENOUS
  Filled 2012-03-30: qty 2

## 2012-03-30 MED ORDER — ACETAMINOPHEN 500 MG PO TABS
ORAL_TABLET | ORAL | Status: AC
  Administered 2012-03-30: 1000 mg via ORAL
  Filled 2012-03-30: qty 2

## 2012-03-30 NOTE — ED Notes (Signed)
Walked into patient's room to check patient's vital signs. Patient sleeping and arousable to verbal. Oxygen saturation picking up at 67% with good pleth on finger and matching heart rate on monitor. Patient hands warm. Placed patient on 4 liters initially, saturation came up to 76%. Then placed on nonrebreather and saturation came up to 91%. Dr. Bebe Shaggy notified. Dr. Bebe Shaggy came into room and assessed patient. Patient repositioned and now placed on 4 liters of oxygen via nasal canula. Saturation 96% on 4 liters. Dr. Bebe Shaggy stated will place order for narcan and abg.

## 2012-03-30 NOTE — H&P (Signed)
Chief Complaint:  Fever, confusion  HPI: 51 yo female with several days of not feeling well, sleeping a lot, not eating well, fevers with some n/v no diarrhea.  deneis cp, sob, abd pain.  Denies dysuria.  Has been drowsy in the ED.  Does not remember when she has been taking her chronic meds (which includes many sedatives).  Denies taking too many however she has not been eating well either.  Vague symptoms.  No cough.  No rashes.  No neck pain.  Review of Systems:  O/w neg  Past Medical History: Past Medical History  Diagnosis Date  . Chronic back pain   . Bipolar 1 disorder   . Anxiety   . Depression   . Hypertension    History reviewed. No pertinent past surgical history.  Medications: Prior to Admission medications   Medication Sig Start Date End Date Taking? Authorizing Provider  ALPRAZolam Prudy Feeler) 1 MG tablet Take 1 mg by mouth 4 (four) times daily.     Yes Historical Provider, MD  amLODipine (NORVASC) 5 MG tablet Take 5 mg by mouth daily.    Historical Provider, MD  buPROPion (WELLBUTRIN XL) 300 MG 24 hr tablet Take 300 mg by mouth every morning.    Historical Provider, MD  busPIRone (BUSPAR) 15 MG tablet Take 15 mg by mouth 3 (three) times daily.      Historical Provider, MD  cyclobenzaprine (FLEXERIL) 10 MG tablet Take 10 mg by mouth 3 (three) times daily as needed.    Historical Provider, MD  diclofenac sodium (VOLTAREN) 1 % GEL Apply 1 application topically daily. As needed for pain     Historical Provider, MD  DULoxetine (CYMBALTA) 60 MG capsule Take 60 mg by mouth daily.      Historical Provider, MD  gabapentin (NEURONTIN) 300 MG capsule Take 300 mg by mouth 3 (three) times daily.      Historical Provider, MD  lisinopril-hydrochlorothiazide (PRINZIDE,ZESTORETIC) 20-25 MG per tablet Take 1 tablet by mouth daily.      Historical Provider, MD  meloxicam (MOBIC) 15 MG tablet Take 15 mg by mouth daily.    Historical Provider, MD  oxybutynin (DITROPAN) 5 MG tablet Take 5 mg  by mouth 2 (two) times daily.    Historical Provider, MD  risperiDONE (RISPERDAL) 2 MG tablet Take 2 mg by mouth at bedtime.    Historical Provider, MD  traMADol (ULTRAM) 50 MG tablet Take 1-2 tablets every 4-6 hrs prn pain.  Do not exceed 8 tablets per day 04/17/11   Tammy L. Triplett, PA  trazodone (DESYREL) 300 MG tablet Take 300 mg by mouth at bedtime.      Historical Provider, MD    Allergies:  No Known Allergies  Social History:  reports that she has been smoking.  She does not have any smokeless tobacco history on file. She reports that she does not drink alcohol or use illicit drugs.  Family History: Family History  Problem Relation Age of Onset  . Hypertension Mother   . Hypertension Brother   . Anxiety disorder Brother   . Depression Brother     Physical Exam: Filed Vitals:   03/30/12 1610 03/30/12 1611 03/30/12 1807 03/30/12 2040  BP:  116/60  104/76  Pulse:  115  93  Temp:  102.7 F (39.3 C) 99.3 F (37.4 C)   TempSrc:  Oral Oral   Resp:  20  10  Height: 5\' 3"  (1.6 m)     Weight: 92.987 kg (205 lb)  SpO2:  98%  96%   General appearance: cooperative and no distress drowsy Neck: no carotid bruit, no JVD and supple, symmetrical, trachea midline Lungs: clear to auscultation bilaterally Heart: regular rate and rhythm, S1, S2 normal, no murmur, click, rub or gallop Abdomen: soft, non-tender; bowel sounds normal; no masses,  no organomegaly Extremities: extremities normal, atraumatic, no cyanosis or edema Pulses: 2+ and symmetric Skin: Skin color, texture, turgor normal. No rashes or lesions Neurologic: Grossly normal no meningeal signs.      Labs on Admission:   [facility] 03/30/12 1830  NA 129*  K 3.3*  CL 89*  CO2 30  GLUCOSE 108*  BUN 12  CREATININE 1.18*  CALCIUM 9.6  MG --  PHOS --    Basename 03/30/12 1830  AST 35  ALT 32  ALKPHOS 105  BILITOT 0.7  PROT 7.3  ALBUMIN 3.2*    Basename 03/30/12 1830  LIPASE 11  AMYLASE --    Basename  03/30/12 1830  WBC 13.1*  NEUTROABS 9.2*  HGB 12.1  HCT 36.1  MCV 89.4  PLT 274   Radiological Exams on Admission: Ct Head Wo Contrast  03/30/2012  *RADIOLOGY REPORT*  Clinical Data: Confusion  CT HEAD WITHOUT CONTRAST  Technique:  Contiguous axial images were obtained from the base of the skull through the vertex without contrast.  Comparison: None.  Findings: Ventricle size is normal.  Negative for acute infarct. Negative for hemorrhage or mass.  Calvarium is intact.  IMPRESSION: Negative  Original Report Authenticated By: Camelia Phenes, M.D.   Dg Chest Port 1 View  03/30/2012  *RADIOLOGY REPORT*  Clinical Data: Cough, vomiting and diarrhea.  PORTABLE CHEST - 1 VIEW  Comparison: 12/01/2008  Findings: Portable semi upright view of the chest was obtained. Stable appearance of the heart and mediastinum.  Heart size is normal.  Lungs are clear without focal airspace disease or edema. Bony structures are intact.  Old fracture involving the right posterior 4th rib.  IMPRESSION: No acute chest findings.  Original Report Authenticated By: Richarda Overlie, M.D.    Assessment/Plan Present on Admission:  51 yo female with drowsiness and uti, fever .Altered mental status .UTI (lower urinary tract infection) .Nausea & vomiting .Dehydration .Chronic back pain .Bipolar 1 disorder .Hypertension .Anxiety .Depression  uti rocephin and send for culture.  i think her drowsiness is likely from some of her psych meds.  Will hold all sedatives.  Neuro cks freqently.  Ivf.  cth neg.  nonfocal neuro exam.   Shalie Schremp A [phone] 03/30/2012, 10:11 PM

## 2012-03-30 NOTE — ED Notes (Signed)
Dr. Bebe Shaggy performed pelvic exam to assess amount of blood. No specimens collected.

## 2012-03-30 NOTE — ED Notes (Signed)
Patient responded to verbal when speaking to patient before and during pelvic exam. Patient very lethargic and having difficulty moving in bed on her own. Assisted patient to move down in bed and get onto bedpan. Patient states that she did not take any pain medication pta. Mother states that patient took some psychiatric medication for depression pta.

## 2012-03-30 NOTE — ED Notes (Signed)
Attempted to call report on patient to 3rd floor, spoke with lyndsey who will be receiving patient. lyndsey stated she would call me back.

## 2012-03-30 NOTE — ED Provider Notes (Signed)
History     CSN: [id number]  Arrival date & time 03/30/12  1530   First MD Initiated Contact with Patient 03/30/12 1746      Chief Complaint  Patient presents with  . Emesis     Patient is a 51 y.o. female presenting with vomiting. The history is provided by the patient.  Emesis  This is a new problem. The current episode started more than 2 days ago. The problem occurs 2 to 4 times per day. The problem has been gradually worsening. The emesis has an appearance of stomach contents. The maximum temperature recorded prior to her arrival was 102 to 102.9 F. Associated symptoms include chills, cough and a fever. Pertinent negatives include no diarrhea.  Pt reports about 3 days she has had cough, vomiting, nausea and fatigue.  She also reports recent vaginal bleeding.  No dysuria.  No abdominal pain.  No new or worsened back pain.  No focal weakness, but feels fatigue and reports "blacking out" while lying in bed.  No HA.  No tick bites, no rash.    Past Medical History  Diagnosis Date  . Chronic back pain   . Bipolar 1 disorder   . Anxiety   . Depression   . Hypertension     History reviewed. No pertinent past surgical history.  Family History  Problem Relation Age of Onset  . Hypertension Mother   . Hypertension Brother   . Anxiety disorder Brother   . Depression Brother     History  Substance Use Topics  . Smoking status: Current Everyday Smoker -- 0.5 packs/day  . Smokeless tobacco: Not on file  . Alcohol Use: No    OB History    Grav Para Term Preterm Abortions TAB SAB Ect Mult Living   2 2 2       2       Review of Systems  Constitutional: Positive for fever and chills.  Respiratory: Positive for cough.   Gastrointestinal: Positive for vomiting. Negative for diarrhea.  All other systems reviewed and are negative.    Allergies  Review of patient's allergies indicates no known allergies.  Home Medications   Current Outpatient Rx  Name Route Sig Dispense  Refill  . ALPRAZOLAM 1 MG PO TABS Oral Take 1 mg by mouth 4 (four) times daily.      Marland Kitchen AMLODIPINE BESYLATE 5 MG PO TABS Oral Take 5 mg by mouth daily.    . BUPROPION HCL ER (XL) 300 MG PO TB24 Oral Take 300 mg by mouth every morning.    . BUSPIRONE HCL 15 MG PO TABS Oral Take 15 mg by mouth 3 (three) times daily.      . CYCLOBENZAPRINE HCL 10 MG PO TABS Oral Take 10 mg by mouth 3 (three) times daily as needed.    Marland Kitchen DICLOFENAC SODIUM 1 % TD GEL Topical Apply 1 application topically daily. As needed for pain     . DULOXETINE HCL 60 MG PO CPEP Oral Take 60 mg by mouth daily.      Marland Kitchen GABAPENTIN 300 MG PO CAPS Oral Take 300 mg by mouth 3 (three) times daily.      Marland Kitchen LISINOPRIL-HYDROCHLOROTHIAZIDE 20-25 MG PO TABS Oral Take 1 tablet by mouth daily.      . MELOXICAM 15 MG PO TABS Oral Take 15 mg by mouth daily.    . OXYBUTYNIN CHLORIDE 5 MG PO TABS Oral Take 5 mg by mouth 2 (two) times daily.    Marland Kitchen  RISPERIDONE 2 MG PO TABS Oral Take 2 mg by mouth at bedtime.    . TRAMADOL HCL 50 MG PO TABS  Take 1-2 tablets every 4-6 hrs prn pain.  Do not exceed 8 tablets per day 25 tablet 0  . TRAZODONE HCL 300 MG PO TABS Oral Take 300 mg by mouth at bedtime.        BP 116/60  Pulse 115  Temp 99.3 F (37.4 C) (Oral)  Resp 20  Ht 5\' 3"  (1.6 m)  Wt 205 lb (92.987 kg)  BMI 36.31 kg/m2  SpO2 98%  LMP 03/30/2012  Physical Exam CONSTITUTIONAL: Well developed/well nourished HEAD AND FACE: Normocephalic/atraumatic EYES: EOMI/PERRL, no icterus ENMT: Mucous membranes moist NECK: supple no meningeal signs SPINE:entire spine nontender CV: S1/S2 noted, no murmurs/rubs/gallops noted LUNGS: Lungs are clear to auscultation bilaterally, no apparent distress ABDOMEN: soft, nontender, no rebound or guarding GU:no cva tenderness Vaginal bleeding noted (moderate) but no cmt /adnexal tenderness.  Chaperone present NEURO: Pt is awake/alert, moves all extremitiesx4, no focal weakness noted EXTREMITIES: pulses normal, full  ROM SKIN: warm, color normal PSYCH: no abnormalities of mood noted  ED Course  Procedures    Labs Reviewed  CBC WITH DIFFERENTIAL  COMPREHENSIVE METABOLIC PANEL  LIPASE, BLOOD  URINALYSIS, ROUTINE W REFLEX MICROSCOPIC  AMMONIA  LACTIC ACID, PLASMA   6:51 PM Pt with vomiting, fever.  Will start IV fluids and reassess 8:51 PM Pt vitals improved though she became somnolent.  She reports that she took some of her meds as prescribed at home prior to arrival.  She is on psych meds.  Narcan ordered.  IV fluids given.   10:03 PM Pt more awake, denies HA, neck supple - I doubt meningitis at this time She is on several psychotropic which could contribute to her AMS Given dehydration, intermittent episodes of AMS and fever and potentially uti, will admit D/w dr Onalee Hua to admit to tele OBS  MDM  Nursing notes including past medical history and social history reviewed and considered in documentation All labs/vitals reviewed and considered xrays reviewed and considered        Date: 03/30/2012  Rate: 99  Rhythm: normal sinus rhythm  QRS Axis: normal  Intervals: normal  ST/T Wave abnormalities: nonspecific ST changes  Conduction Disutrbances:nonspecific intraventricular conduction delay     Joya Gaskins, MD 03/30/12 2205

## 2012-03-30 NOTE — ED Notes (Signed)
Vomiting, diarrhea,  Chills, vag bleeding for 15 days. Feels sleepy

## 2012-03-31 ENCOUNTER — Encounter (HOSPITAL_COMMUNITY): Payer: Self-pay | Admitting: General Practice

## 2012-03-31 DIAGNOSIS — I951 Orthostatic hypotension: Secondary | ICD-10-CM

## 2012-03-31 LAB — CBC
HCT: 30.7 % — ABNORMAL LOW (ref 36.0–46.0)
Hemoglobin: 10.2 g/dL — ABNORMAL LOW (ref 12.0–15.0)
MCH: 30 pg (ref 26.0–34.0)
MCHC: 33.2 g/dL (ref 30.0–36.0)
MCV: 90.3 fL (ref 78.0–100.0)
Platelets: 257 10*3/uL (ref 150–400)
RBC: 3.4 MIL/uL — ABNORMAL LOW (ref 3.87–5.11)
RDW: 15 % (ref 11.5–15.5)
WBC: 8.6 10*3/uL (ref 4.0–10.5)

## 2012-03-31 LAB — BASIC METABOLIC PANEL
BUN: 8 mg/dL (ref 6–23)
CO2: 31 mEq/L (ref 19–32)
Calcium: 8.2 mg/dL — ABNORMAL LOW (ref 8.4–10.5)
Chloride: 102 mEq/L (ref 96–112)
Creatinine, Ser: 0.9 mg/dL (ref 0.50–1.10)
GFR calc Af Amer: 84 mL/min — ABNORMAL LOW (ref >90)
GFR calc non Af Amer: 73 mL/min — ABNORMAL LOW (ref >90)
Glucose, Bld: 96 mg/dL (ref 70–99)
Potassium: 3.7 mEq/L (ref 3.5–5.1)
Sodium: 139 mEq/L (ref 135–145)

## 2012-03-31 MED ORDER — OXYBUTYNIN CHLORIDE 5 MG PO TABS
5.0000 mg | ORAL_TABLET | Freq: Two times a day (BID) | ORAL | Status: DC
  Administered 2012-03-31 – 2012-04-01 (×4): 5 mg via ORAL
  Filled 2012-03-31 (×4): qty 1

## 2012-03-31 MED ORDER — ALPRAZOLAM 0.5 MG PO TABS
0.5000 mg | ORAL_TABLET | Freq: Three times a day (TID) | ORAL | Status: DC | PRN
  Administered 2012-03-31 – 2012-04-01 (×3): 0.5 mg via ORAL
  Filled 2012-03-31 (×3): qty 1

## 2012-03-31 MED ORDER — POTASSIUM CHLORIDE IN NACL 20-0.9 MEQ/L-% IV SOLN
INTRAVENOUS | Status: AC
  Administered 2012-03-31: 01:00:00 via INTRAVENOUS

## 2012-03-31 MED ORDER — BUSPIRONE HCL 5 MG PO TABS
15.0000 mg | ORAL_TABLET | Freq: Two times a day (BID) | ORAL | Status: DC
  Administered 2012-03-31 – 2012-04-01 (×2): 15 mg via ORAL
  Filled 2012-03-31 (×2): qty 3

## 2012-03-31 MED ORDER — ONDANSETRON HCL 4 MG PO TABS: 4.0000 mg | ORAL_TABLET | Freq: Four times a day (QID) | ORAL | Status: DC | PRN

## 2012-03-31 MED ORDER — ONDANSETRON HCL 4 MG/2ML IJ SOLN
4.0000 mg | Freq: Four times a day (QID) | INTRAMUSCULAR | Status: DC | PRN
  Administered 2012-03-31 – 2012-04-01 (×3): 4 mg via INTRAVENOUS
  Filled 2012-03-31 (×3): qty 2

## 2012-03-31 MED ORDER — DULOXETINE HCL 60 MG PO CPEP
60.0000 mg | ORAL_CAPSULE | Freq: Every day | ORAL | Status: DC
  Administered 2012-03-31 – 2012-04-01 (×2): 60 mg via ORAL
  Filled 2012-03-31 (×2): qty 1

## 2012-03-31 MED ORDER — TRAMADOL HCL 50 MG PO TABS
50.0000 mg | ORAL_TABLET | Freq: Four times a day (QID) | ORAL | Status: DC | PRN
  Administered 2012-03-31 (×2): 50 mg via ORAL
  Filled 2012-03-31 (×2): qty 1

## 2012-03-31 MED ORDER — LISINOPRIL 10 MG PO TABS: 20.0000 mg | ORAL_TABLET | Freq: Every day | ORAL | Status: DC

## 2012-03-31 MED ORDER — HYDROCHLOROTHIAZIDE 25 MG PO TABS: 25.0000 mg | ORAL_TABLET | Freq: Every day | ORAL | Status: DC

## 2012-03-31 MED ORDER — SODIUM CHLORIDE 0.9 % IV SOLN
Freq: Once | INTRAVENOUS | Status: AC
  Administered 2012-03-31: 03:00:00 via INTRAVENOUS

## 2012-03-31 MED ORDER — ENOXAPARIN SODIUM 40 MG/0.4ML ~~LOC~~ SOLN
40.0000 mg | SUBCUTANEOUS | Status: DC
  Administered 2012-03-31: 40 mg via SUBCUTANEOUS
  Filled 2012-03-31: qty 0.4

## 2012-03-31 MED ORDER — PHENOL 1.4 % MT LIQD
1.0000 | OROMUCOSAL | Status: DC | PRN
  Administered 2012-03-31: 1 via OROMUCOSAL
  Filled 2012-03-31: qty 177

## 2012-03-31 MED ORDER — SODIUM CHLORIDE 0.9 % IJ SOLN
3.0000 mL | Freq: Two times a day (BID) | INTRAMUSCULAR | Status: DC
  Administered 2012-03-31: 3 mL via INTRAVENOUS
  Filled 2012-03-31: qty 3

## 2012-03-31 MED ORDER — SULFAMETHOXAZOLE-TMP DS 800-160 MG PO TABS
1.0000 | ORAL_TABLET | Freq: Two times a day (BID) | ORAL | Status: DC
  Administered 2012-03-31 – 2012-04-01 (×3): 1 via ORAL
  Filled 2012-03-31 (×3): qty 1

## 2012-03-31 MED ORDER — GABAPENTIN 300 MG PO CAPS
300.0000 mg | ORAL_CAPSULE | Freq: Two times a day (BID) | ORAL | Status: DC
  Administered 2012-04-01: 300 mg via ORAL
  Filled 2012-03-31: qty 1

## 2012-03-31 MED ORDER — BUPROPION HCL ER (XL) 300 MG PO TB24
300.0000 mg | ORAL_TABLET | Freq: Every morning | ORAL | Status: DC
  Administered 2012-03-31 – 2012-04-01 (×2): 300 mg via ORAL
  Filled 2012-03-31 (×4): qty 1

## 2012-03-31 MED ORDER — AMLODIPINE BESYLATE 5 MG PO TABS: 5.0000 mg | ORAL_TABLET | Freq: Every day | ORAL | Status: DC

## 2012-03-31 MED ORDER — MELOXICAM 7.5 MG PO TABS
15.0000 mg | ORAL_TABLET | Freq: Every day | ORAL | Status: DC
  Administered 2012-03-31 – 2012-04-01 (×2): 15 mg via ORAL
  Filled 2012-03-31: qty 2
  Filled 2012-03-31 (×2): qty 1
  Filled 2012-03-31 (×2): qty 2

## 2012-03-31 MED ORDER — DEXTROSE 5 % IV SOLN
1.0000 g | INTRAVENOUS | Status: DC
  Filled 2012-03-31: qty 10

## 2012-03-31 MED ORDER — BIOTENE DRY MOUTH MT LIQD
15.0000 mL | Freq: Two times a day (BID) | OROMUCOSAL | Status: DC
  Administered 2012-04-01: 15 mL via OROMUCOSAL

## 2012-03-31 MED ORDER — BUSPIRONE HCL 5 MG PO TABS
15.0000 mg | ORAL_TABLET | Freq: Three times a day (TID) | ORAL | Status: DC
  Administered 2012-03-31: 15 mg via ORAL
  Filled 2012-03-31: qty 3

## 2012-03-31 MED ORDER — ALPRAZOLAM 0.5 MG PO TABS: 0.5000 mg | ORAL_TABLET | Freq: Three times a day (TID) | ORAL | Status: DC

## 2012-03-31 MED ORDER — POTASSIUM CHLORIDE IN NACL 20-0.9 MEQ/L-% IV SOLN
INTRAVENOUS | Status: DC
  Administered 2012-03-31 – 2012-04-01 (×2): via INTRAVENOUS

## 2012-03-31 MED ORDER — LISINOPRIL-HYDROCHLOROTHIAZIDE 20-25 MG PO TABS
1.0000 | ORAL_TABLET | Freq: Every day | ORAL | Status: DC
Start: 2012-03-31 — End: 2012-03-31

## 2012-03-31 MED ORDER — ALPRAZOLAM 0.5 MG PO TABS
0.5000 mg | ORAL_TABLET | Freq: Three times a day (TID) | ORAL | Status: DC
  Administered 2012-03-31: 0.5 mg via ORAL
  Filled 2012-03-31: qty 1

## 2012-03-31 MED ORDER — GABAPENTIN 300 MG PO CAPS
300.0000 mg | ORAL_CAPSULE | Freq: Three times a day (TID) | ORAL | Status: DC
  Administered 2012-03-31: 300 mg via ORAL
  Filled 2012-03-31: qty 1

## 2012-03-31 MED ORDER — ACETAMINOPHEN 325 MG PO TABS
650.0000 mg | ORAL_TABLET | Freq: Four times a day (QID) | ORAL | Status: DC | PRN
  Administered 2012-03-31: 650 mg via ORAL
  Filled 2012-03-31: qty 2

## 2012-03-31 NOTE — Progress Notes (Signed)
At 0200, patient's orthostatics were done. Lying BP was 85/57, HR 87; Sitting BP was 89/59, HR 92; standing BP was 77/48, HR 96. Patient was unsteady standing up. Doctor was notified and new orders were given for a 1L bolus once. Will continue to monitor patient.

## 2012-03-31 NOTE — Progress Notes (Signed)
UR Chart Review Completed  

## 2012-03-31 NOTE — Progress Notes (Signed)
Patient was throwing up. Zofran was not due for another 2 hours. Doctor was notified and new orders were given to give Zofran early. Will continue to monitor patient.

## 2012-03-31 NOTE — Progress Notes (Signed)
Chart reviewed.  Subjective: Feels a little better. Had anxiety this morning, so xanax started at lower dose. Eating well. Stronger  Objective: Vital signs in last 24 hours: Filed Vitals:   03/31/12 0357 03/31/12 0620 03/31/12 0845 03/31/12 2003  BP: 112/67 88/55 101/69 94/63  Pulse: 89 97  95  Temp:  99.1 F (37.3 C)  98.5 F (36.9 C)  TempSrc:  Oral  Oral  Resp:  20  20  Height:      Weight:      SpO2:  92%  93%   Weight change:   Intake/Output Summary (Last 24 hours) at 03/31/12 2112 Last data filed at 03/31/12 1725  Gross per 24 hour  Intake    900 ml  Output      0 ml  Net    900 ml   Gen: alert, oriented and cooperative Lungs cta without wrr cv rrr without mgr abd s,nt, nd Ext no cce  Lab Results: Basic Metabolic Panel:  Lab 03/31/12 4782 03/30/12 1830  NA 139 129*  K 3.7 3.3*  CL 102 89*  CO2 31 30  GLUCOSE 96 108*  BUN 8 12  CREATININE 0.90 1.18*  CALCIUM 8.2* 9.6  MG -- --  PHOS -- --   Liver Function Tests:  Lab 03/30/12 1830  AST 35  ALT 32  ALKPHOS 105  BILITOT 0.7  PROT 7.3  ALBUMIN 3.2*    Lab 03/30/12 1830  LIPASE 11  AMYLASE --    Lab 03/30/12 1830  AMMONIA 24   CBC:  Lab 03/31/12 0517 03/30/12 1830  WBC 8.6 13.1*  NEUTROABS -- 9.2*  HGB 10.2* 12.1  HCT 30.7* 36.1  MCV 90.3 89.4  PLT 257 274   Cardiac Enzymes: No results found for this basename: CKTOTAL:3,CKMB:3,CKMBINDEX:3,TROPONINI:3 in the last 168 hours BNP: No results found for this basename: PROBNP:3 in the last 168 hours D-Dimer: No results found for this basename: DDIMER:2 in the last 168 hours CBG: No results found for this basename: GLUCAP:6 in the last 168 hours Hemoglobin A1C: No results found for this basename: HGBA1C in the last 168 hours Fasting Lipid Panel: No results found for this basename: CHOL,HDL,LDLCALC,TRIG,CHOLHDL,LDLDIRECT in the last 956 hours Thyroid Function Tests: No results found for this basename:  TSH,T4TOTAL,FREET4,T3FREE,THYROIDAB in the last 168 hours Coagulation: No results found for this basename: LABPROT:4,INR:4 in the last 168 hours Anemia Panel: No results found for this basename: VITAMINB12,FOLATE,FERRITIN,TIBC,IRON,RETICCTPCT in the last 168 hours Urine Drug Screen: Drugs of Abuse     Component Value Date/Time   LABOPIA NONE DETECTED 03/30/2012 2130   COCAINSCRNUR NONE DETECTED 03/30/2012 2130   LABBENZ POSITIVE* 03/30/2012 2130   AMPHETMU NONE DETECTED 03/30/2012 2130   THCU NONE DETECTED 03/30/2012 2130   LABBARB NONE DETECTED 03/30/2012 2130    Alcohol Level: No results found for this basename: ETH:2 in the last 168 hours Urinalysis:  Lab 03/30/12 2003  COLORURINE YELLOW  LABSPEC 1.015  PHURINE 5.5  GLUCOSEU NEGATIVE  HGBUR TRACE*  BILIRUBINUR NEGATIVE  KETONESUR NEGATIVE  PROTEINUR NEGATIVE  UROBILINOGEN 0.2  NITRITE NEGATIVE  LEUKOCYTESUR TRACE*    Micro Results: Recent Results (from the past 240 hour(s))  CULTURE, BLOOD (ROUTINE X 2)     Status: Normal (Preliminary result)   Collection Time   03/31/12 12:37 AM      Component Value Range Status Comment   Specimen Description BLOOD RIGHT ANTECUBITAL   Final    Special Requests     Final  Value: BOTTLES DRAWN AEROBIC AND ANAEROBIC AEB 6CC ANA 5CC   Culture NO GROWTH <24 HRS   Final    Report Status PENDING   Incomplete   CULTURE, BLOOD (ROUTINE X 2)     Status: Normal (Preliminary result)   Collection Time   03/31/12 12:38 AM      Component Value Range Status Comment   Specimen Description BLOOD LEFT HAND   Final    Special Requests BOTTLES DRAWN AEROBIC AND ANAEROBIC 6CC EACH   Final    Culture NO GROWTH <24 HRS   Final    Report Status PENDING   Incomplete    Studies/Results: Ct Head Wo Contrast  03/30/2012  *RADIOLOGY REPORT*  Clinical Data: Confusion  CT HEAD WITHOUT CONTRAST  Technique:  Contiguous axial images were obtained from the base of the skull through the vertex without contrast.   Comparison: None.  Findings: Ventricle size is normal.  Negative for acute infarct. Negative for hemorrhage or mass.  Calvarium is intact.  IMPRESSION: Negative  Original Report Authenticated By: Camelia Phenes, M.D.   Dg Chest Port 1 View  03/30/2012  *RADIOLOGY REPORT*  Clinical Data: Cough, vomiting and diarrhea.  PORTABLE CHEST - 1 VIEW  Comparison: 12/01/2008  Findings: Portable semi upright view of the chest was obtained. Stable appearance of the heart and mediastinum.  Heart size is normal.  Lungs are clear without focal airspace disease or edema. Bony structures are intact.  Old fracture involving the right posterior 4th rib.  IMPRESSION: No acute chest findings.  Original Report Authenticated By: Richarda Overlie, M.D.   Scheduled Meds:   . sodium chloride   Intravenous Once  . antiseptic oral rinse  15 mL Mouth Rinse BID  . buPROPion  300 mg Oral q morning - 10a  . busPIRone  15 mg Oral BID  . cefTRIAXone (ROCEPHIN)  IV  1 g Intravenous Once  . DULoxetine  60 mg Oral Daily  . enoxaparin  40 mg Subcutaneous Q24H  . gabapentin  300 mg Oral BID  . meloxicam  15 mg Oral Daily  . naloxone      . oxybutynin  5 mg Oral BID  . sodium chloride  1,000 mL Intravenous Once  . sodium chloride  3 mL Intravenous Q12H  . sulfamethoxazole-trimethoprim  1 tablet Oral Q12H  . DISCONTD: acetaminophen  650 mg Oral Once  . DISCONTD: ALPRAZolam  0.5 mg Oral TID  . DISCONTD: ALPRAZolam  0.5 mg Oral TID  . DISCONTD: amLODipine  5 mg Oral Daily  . DISCONTD: busPIRone  15 mg Oral TID  . DISCONTD: cefTRIAXone (ROCEPHIN)  IV  1 g Intravenous Q24H  . DISCONTD: gabapentin  300 mg Oral TID  . DISCONTD: hydrochlorothiazide  25 mg Oral Daily  . DISCONTD: lisinopril  20 mg Oral Daily  . DISCONTD: lisinopril-hydrochlorothiazide  1 tablet Oral Daily   Continuous Infusions:   . 0.9 % NaCl with KCl 20 mEq / L 75 mL/hr at 03/31/12 0101  . 0.9 % NaCl with KCl 20 mEq / L 75 mL/hr at 03/31/12 1555   PRN  Meds:.acetaminophen, ALPRAZolam, ondansetron (ZOFRAN) IV, ondansetron, phenol, traMADol Assessment/Plan: Principal Problem:  *Altered mental status Active Problems:  Dehydration  Orthostatic hypotension  Hypertension  UTI (lower urinary tract infection)  Chronic back pain  Bipolar 1 disorder  Anxiety  Nausea & vomiting  Hold antihypertensives. Continue ivf. Change abx to po. Increase activity.   LOS: 1 day   Tiffany Lynch L 03/31/2012, 9:12  PM

## 2012-04-01 DIAGNOSIS — N938 Other specified abnormal uterine and vaginal bleeding: Secondary | ICD-10-CM

## 2012-04-01 DIAGNOSIS — N925 Other specified irregular menstruation: Secondary | ICD-10-CM

## 2012-04-01 DIAGNOSIS — N949 Unspecified condition associated with female genital organs and menstrual cycle: Secondary | ICD-10-CM

## 2012-04-01 DIAGNOSIS — G9340 Encephalopathy, unspecified: Secondary | ICD-10-CM

## 2012-04-01 MED ORDER — CYCLOBENZAPRINE HCL 10 MG PO TABS: 5.0000 mg | ORAL_TABLET | Freq: Three times a day (TID) | ORAL | Status: DC | PRN

## 2012-04-01 MED ORDER — BUSPIRONE HCL 15 MG PO TABS: 15.0000 mg | ORAL_TABLET | Freq: Two times a day (BID) | ORAL | Status: DC

## 2012-04-01 MED ORDER — SULFAMETHOXAZOLE-TMP DS 800-160 MG PO TABS: 1.0000 | ORAL_TABLET | Freq: Two times a day (BID) | ORAL | Status: AC

## 2012-04-01 MED ORDER — ALPRAZOLAM 1 MG PO TABS: 0.5000 mg | ORAL_TABLET | Freq: Four times a day (QID) | ORAL | Status: DC | PRN

## 2012-04-01 MED ORDER — TRAZODONE HCL 100 MG PO TABS: 200.0000 mg | ORAL_TABLET | Freq: Every day | ORAL | Status: DC

## 2012-04-01 MED ORDER — SULFAMETHOXAZOLE-TMP DS 800-160 MG PO TABS: 1.0000 | ORAL_TABLET | Freq: Two times a day (BID) | ORAL | Status: DC

## 2012-04-01 MED ORDER — TRAMADOL HCL 50 MG PO TABS: 50.0000 mg | ORAL_TABLET | Freq: Four times a day (QID) | ORAL | Status: DC | PRN

## 2012-04-01 NOTE — Progress Notes (Signed)
Pt d/c to home. Pt received d/c instructions, prescriptions, and f/u appointment info. She verbalizes understanding of teaching, and has no questions. D/c ambulatory, escorted by C. Raul Del, Charity fundraiser.

## 2012-04-01 NOTE — Discharge Summary (Signed)
Physician Discharge Summary  Patient ID: Tiffany Lynch MRN: [id number] DOB/AGE: [birth year] 51 y.o.  Admit date: 03/30/2012 Discharge date: 04/01/2012  Discharge Diagnoses:  Principal Problem:  *Encephalopathy acute secondary to UTI, dehydration and medications Active Problems:  Dehydration  Orthostatic hypotension  Hypertension  UTI (lower urinary tract infection)  Chronic back pain  Bipolar 1 disorder  Anxiety  Nausea & vomiting  Dysfunctional uterine bleeding   Medication List  As of 04/01/2012 12:06 PM   STOP taking these medications         amLODipine 5 MG tablet      lisinopril-hydrochlorothiazide 20-25 MG per tablet      meloxicam 15 MG tablet         TAKE these medications         ALPRAZolam 1 MG tablet   Commonly known as: XANAX   Take 0.5-1 tablets (0.5-1 mg total) by mouth 4 (four) times daily as needed for anxiety.      buPROPion 300 MG 24 hr tablet   Commonly known as: WELLBUTRIN XL   Take 300 mg by mouth every morning.      busPIRone 15 MG tablet   Commonly known as: BUSPAR   Take 1 tablet (15 mg total) by mouth 2 (two) times daily.      cyclobenzaprine 10 MG tablet   Commonly known as: FLEXERIL   Take 0.5 tablets (5 mg total) by mouth 3 (three) times daily as needed. Muscle spasm      DULoxetine 60 MG capsule   Commonly known as: CYMBALTA   Take 60 mg by mouth daily.      gabapentin 300 MG capsule   Commonly known as: NEURONTIN   Take 300 mg by mouth 4 (four) times daily.      oxybutynin 5 MG tablet   Commonly known as: DITROPAN   Take 5 mg by mouth 2 (two) times daily.      risperiDONE 2 MG tablet   Commonly known as: RISPERDAL   Take 2 mg by mouth at bedtime.      sulfamethoxazole-trimethoprim 800-160 MG per tablet   Commonly known as: BACTRIM DS   Take 1 tablet by mouth every 12 (twelve) hours.      traMADol 50 MG tablet   Commonly known as: ULTRAM   Take 1 tablet (50 mg total) by mouth every 6 (six) hours as needed for pain.  pain      traZODone 100 MG tablet   Commonly known as: DESYREL   Take 2 tablets (200 mg total) by mouth at bedtime.      VOLTAREN 1 % Gel   Generic drug: diclofenac sodium   Apply 1 application topically daily. As needed for pain            Discharge Orders    Future Orders Please Complete By Expires   Diet - low sodium heart healthy      Activity as tolerated - No restrictions         Follow-up Information    Follow up with Tilda Burrow, MD.   Contact information:   [facility] [address] [place] [address] [phone]       Follow up with YOUR PRIMARY CARE PROVIDER in 1 week. (TO CHECK BLOOD PRESSURE)          Disposition: 01-Home or Self Care  Discharged Condition: stable  Consults:  none  Labs:   Results for orders placed during the hospital  encounter of 03/30/12 (from the past 48 hour(s))  CBC WITH DIFFERENTIAL     Status: Abnormal   Collection Time   03/30/12  6:30 PM      Component Value Range Comment   WBC 13.1 (*) 4.0 - 10.5 K/uL    RBC 4.04  3.87 - 5.11 MIL/uL    Hemoglobin 12.1  12.0 - 15.0 g/dL    HCT 78.2  95.6 - 21.3 %    MCV 89.4  78.0 - 100.0 fL    MCH 30.0  26.0 - 34.0 pg    MCHC 33.5  30.0 - 36.0 g/dL    RDW 08.6  57.8 - 46.9 %    Platelets 274  150 - 400 K/uL    Neutrophils Relative 70  43 - 77 %    Neutro Abs 9.2 (*) 1.7 - 7.7 K/uL    Lymphocytes Relative 17  12 - 46 %    Lymphs Abs 2.3  0.7 - 4.0 K/uL    Monocytes Relative 12  3 - 12 %    Monocytes Absolute 1.6 (*) 0.1 - 1.0 K/uL    Eosinophils Relative 0  0 - 5 %    Eosinophils Absolute 0.0  0.0 - 0.7 K/uL    Basophils Relative 0  0 - 1 %    Basophils Absolute 0.0  0.0 - 0.1 K/uL   COMPREHENSIVE METABOLIC PANEL     Status: Abnormal   Collection Time   03/30/12  6:30 PM      Component Value Range Comment   Sodium 129 (*) 135 - 145 mEq/L    Potassium 3.3 (*) 3.5 - 5.1 mEq/L    Chloride 89 (*) 96 - 112 mEq/L    CO2 30  19 - 32 mEq/L     Glucose, Bld 108 (*) 70 - 99 mg/dL    BUN 12  6 - 23 mg/dL    Creatinine, Ser 6.29 (*) 0.50 - 1.10 mg/dL    Calcium 9.6  8.4 - 52.8 mg/dL    Total Protein 7.3  6.0 - 8.3 g/dL    Albumin 3.2 (*) 3.5 - 5.2 g/dL    AST 35  0 - 37 U/L    ALT 32  0 - 35 U/L    Alkaline Phosphatase 105  39 - 117 U/L    Total Bilirubin 0.7  0.3 - 1.2 mg/dL    GFR calc non Af Amer 52 (*) >90 mL/min    GFR calc Af Amer 61 (*) >90 mL/min   LIPASE, BLOOD     Status: Normal   Collection Time   03/30/12  6:30 PM      Component Value Range Comment   Lipase 11  11 - 59 U/L   AMMONIA     Status: Normal   Collection Time   03/30/12  6:30 PM      Component Value Range Comment   Ammonia 24  11 - 60 umol/L   LACTIC ACID, PLASMA     Status: Normal   Collection Time   03/30/12  6:30 PM      Component Value Range Comment   Lactic Acid, Venous 0.7  0.5 - 2.2 mmol/L   URINALYSIS, ROUTINE W REFLEX MICROSCOPIC     Status: Abnormal   Collection Time   03/30/12  8:03 PM      Component Value Range Comment   Color, Urine YELLOW  YELLOW    APPearance CLEAR  CLEAR    Specific  Gravity, Urine 1.015  1.005 - 1.030    pH 5.5  5.0 - 8.0    Glucose, UA NEGATIVE  NEGATIVE mg/dL    Hgb urine dipstick TRACE (*) NEGATIVE    Bilirubin Urine NEGATIVE  NEGATIVE    Ketones, ur NEGATIVE  NEGATIVE mg/dL    Protein, ur NEGATIVE  NEGATIVE mg/dL    Urobilinogen, UA 0.2  0.0 - 1.0 mg/dL    Nitrite NEGATIVE  NEGATIVE    Leukocytes, UA TRACE (*) NEGATIVE   URINE MICROSCOPIC-ADD ON     Status: Abnormal   Collection Time   03/30/12  8:03 PM      Component Value Range Comment   Squamous Epithelial / LPF FEW (*) RARE    WBC, UA 7-10  <3 WBC/hpf    RBC / HPF 0-2  <3 RBC/hpf    Bacteria, UA MANY (*) RARE   URINE CULTURE     Status: Normal (Preliminary result)   Collection Time   03/30/12  8:03 PM      Component Value Range Comment   Specimen Description URINE, CATHETERIZED      Special Requests NONE      Culture  Setup Time 03/30/2012 22:06       Colony Count PENDING      Culture Culture reincubated for better growth      Report Status PENDING     POCT PREGNANCY, URINE     Status: Normal   Collection Time   03/30/12  8:18 PM      Component Value Range Comment   Preg Test, Ur NEGATIVE  NEGATIVE   BLOOD GAS, ARTERIAL     Status: Abnormal   Collection Time   03/30/12  9:15 PM      Component Value Range Comment   O2 Content 4.0      Delivery systems NASAL CANNULA      pH, Arterial 7.377  7.350 - 7.450    pCO2 arterial 47.3 (*) 35.0 - 45.0 mmHg    pO2, Arterial 75.1 (*) 80.0 - 100.0 mmHg    Bicarbonate 27.1 (*) 20.0 - 24.0 mEq/L    TCO2 23.6  0 - 100 mmol/L    Acid-Base Excess 2.4 (*) 0.0 - 2.0 mmol/L    O2 Saturation 93.7      Collection site RIGHT RADIAL      Drawn by [id number]      Sample type ARTERIAL     URINE RAPID DRUG SCREEN (HOSP PERFORMED)     Status: Abnormal   Collection Time   03/30/12  9:30 PM      Component Value Range Comment   Opiates NONE DETECTED  NONE DETECTED    Cocaine NONE DETECTED  NONE DETECTED    Benzodiazepines POSITIVE (*) NONE DETECTED    Amphetamines NONE DETECTED  NONE DETECTED    Tetrahydrocannabinol NONE DETECTED  NONE DETECTED    Barbiturates NONE DETECTED  NONE DETECTED   ACETAMINOPHEN LEVEL     Status: Normal   Collection Time   03/30/12  9:31 PM      Component Value Range Comment   Acetaminophen (Tylenol), Serum <15.0  10 - 30 ug/mL   SALICYLATE LEVEL     Status: Abnormal   Collection Time   03/30/12  9:31 PM      Component Value Range Comment   Salicylate Lvl <2.0 (*) 2.8 - 20.0 mg/dL   CULTURE, BLOOD (ROUTINE X 2)     Status: Normal (Preliminary result)   Collection  Time   03/31/12 12:37 AM      Component Value Range Comment   Specimen Description BLOOD RIGHT ANTECUBITAL      Special Requests        Value: BOTTLES DRAWN AEROBIC AND ANAEROBIC AEB=6CC ANA=5CC   Culture NO GROWTH 1 DAY      Report Status PENDING     CULTURE, BLOOD (ROUTINE X 2)     Status: Normal (Preliminary  result)   Collection Time   03/31/12 12:38 AM      Component Value Range Comment   Specimen Description BLOOD LEFT HAND      Special Requests BOTTLES DRAWN AEROBIC AND ANAEROBIC 6CC EACH      Culture NO GROWTH 1 DAY      Report Status PENDING     BASIC METABOLIC PANEL     Status: Abnormal   Collection Time   03/31/12  5:17 AM      Component Value Range Comment   Sodium 139  135 - 145 mEq/L DELTA CHECK NOTED   Potassium 3.7  3.5 - 5.1 mEq/L    Chloride 102  96 - 112 mEq/L    CO2 31  19 - 32 mEq/L    Glucose, Bld 96  70 - 99 mg/dL    BUN 8  6 - 23 mg/dL    Creatinine, Ser 5.62  0.50 - 1.10 mg/dL    Calcium 8.2 (*) 8.4 - 10.5 mg/dL    GFR calc non Af Amer 73 (*) >90 mL/min    GFR calc Af Amer 84 (*) >90 mL/min   CBC     Status: Abnormal   Collection Time   03/31/12  5:17 AM      Component Value Range Comment   WBC 8.6  4.0 - 10.5 K/uL    RBC 3.40 (*) 3.87 - 5.11 MIL/uL    Hemoglobin 10.2 (*) 12.0 - 15.0 g/dL    HCT 13.0 (*) 86.5 - 46.0 %    MCV 90.3  78.0 - 100.0 fL    MCH 30.0  26.0 - 34.0 pg    MCHC 33.2  30.0 - 36.0 g/dL    RDW 78.4  69.6 - 29.5 %    Platelets 257  150 - 400 K/uL     Diagnostics:  Ct Head Wo Contrast  03/30/2012  *RADIOLOGY REPORT*  Clinical Data: Confusion  CT HEAD WITHOUT CONTRAST  Technique:  Contiguous axial images were obtained from the base of the skull through the vertex without contrast.  Comparison: None.  Findings: Ventricle size is normal.  Negative for acute infarct. Negative for hemorrhage or mass.  Calvarium is intact.  IMPRESSION: Negative  Original Report Authenticated By: Camelia Phenes, M.D.   Dg Chest Port 1 View  03/30/2012  *RADIOLOGY REPORT*  Clinical Data: Cough, vomiting and diarrhea.  PORTABLE CHEST - 1 VIEW  Comparison: 12/01/2008  Findings: Portable semi upright view of the chest was obtained. Stable appearance of the heart and mediastinum.  Heart size is normal.  Lungs are clear without focal airspace disease or edema. Bony structures  are intact.  Old fracture involving the right posterior 4th rib.  IMPRESSION: No acute chest findings.  Original Report Authenticated By: Richarda Overlie, M.D.   Hospital Course: See H&P for complete admission details. The patient is a 51 year old white female on multiple sedating medications who was brought to the emergency room with somnolence, weakness, nausea, poor appetite. She also had several weeks of vaginal bleeding. In the  emergency room, she was febrile. She is very somnolent. Workup significant for urinary tract infection. Her sedating medications were decreased. Her blood pressure dropped transiently and she was taken off her antihypertensives. She received IV fluids and antibiotics. By the time of discharge, she was ambulating, normotensive, eating and felt much better. I encouraged her to consider decreasing her sedating medications but she is resistant to this idea. She wishes to followup with gynecology as an outpatient for her vaginal bleeding. Her hemoglobin has been stable. She will need to followup with her primary care physician in a week to check blood pressure and resume antihypertensives if elevated. Total time on the day of discharge greater than 30 minutes.  Discharge Exam:  Blood pressure 110/73, pulse 91, temperature 97.8 F (36.6 C), temperature source Oral, resp. rate 20, height 5\' 3"  (1.6 m), weight 92.987 kg (205 lb), last menstrual period 03/30/2012, SpO2 92.00%.  General: Alert and oriented without wheezes rhonchi or rales Cardiovascular regular rate rhythm without murmurs gallops rubs Abdomen soft nontender nondistended Extremities no clubbing cyanosis or edema  Signed: Rei Medlen L 04/01/2012, 12:06 PM

## 2012-04-03 LAB — URINE CULTURE: Colony Count: >=100000

## 2012-04-05 LAB — CULTURE, BLOOD (ROUTINE X 2)
Culture: NO GROWTH
Culture: NO GROWTH

## 2012-04-21 ENCOUNTER — Emergency Department (HOSPITAL_COMMUNITY)
Admission: EM | Admit: 2012-04-21 | Discharge: 2012-04-21 | Disposition: A | Discharge status: Home / Self Care | Payer: Medicaid Other | Attending: Emergency Medicine | Admitting: Emergency Medicine

## 2012-04-21 ENCOUNTER — Encounter (HOSPITAL_COMMUNITY): Payer: Self-pay

## 2012-04-21 ENCOUNTER — Emergency Department (HOSPITAL_COMMUNITY): Payer: Medicaid Other

## 2012-04-21 DIAGNOSIS — N898 Other specified noninflammatory disorders of vagina: Secondary | ICD-10-CM | POA: Insufficient documentation

## 2012-04-21 DIAGNOSIS — I10 Essential (primary) hypertension: Secondary | ICD-10-CM | POA: Insufficient documentation

## 2012-04-21 DIAGNOSIS — N939 Abnormal uterine and vaginal bleeding, unspecified: Secondary | ICD-10-CM

## 2012-04-21 DIAGNOSIS — F17200 Nicotine dependence, unspecified, uncomplicated: Secondary | ICD-10-CM | POA: Insufficient documentation

## 2012-04-21 DIAGNOSIS — F319 Bipolar disorder, unspecified: Secondary | ICD-10-CM | POA: Insufficient documentation

## 2012-04-21 DIAGNOSIS — F411 Generalized anxiety disorder: Secondary | ICD-10-CM | POA: Insufficient documentation

## 2012-04-21 DIAGNOSIS — Z79899 Other long term (current) drug therapy: Secondary | ICD-10-CM | POA: Insufficient documentation

## 2012-04-21 LAB — URINALYSIS, ROUTINE W REFLEX MICROSCOPIC
Bilirubin Urine: NEGATIVE
Glucose, UA: NEGATIVE mg/dL
Hgb urine dipstick: NEGATIVE
Ketones, ur: NEGATIVE mg/dL
Leukocytes, UA: NEGATIVE
Nitrite: NEGATIVE
Protein, ur: NEGATIVE mg/dL
Specific Gravity, Urine: 1.02 (ref 1.005–1.030)
Urobilinogen, UA: 0.2 mg/dL (ref 0.0–1.0)
pH: 5.5 (ref 5.0–8.0)

## 2012-04-21 LAB — CBC
HCT: 32.2 % — ABNORMAL LOW (ref 36.0–46.0)
Hemoglobin: 10.9 g/dL — ABNORMAL LOW (ref 12.0–15.0)
MCH: 30.2 pg (ref 26.0–34.0)
MCHC: 33.9 g/dL (ref 30.0–36.0)
MCV: 89.2 fL (ref 78.0–100.0)
Platelets: 310 10*3/uL (ref 150–400)
RBC: 3.61 MIL/uL — ABNORMAL LOW (ref 3.87–5.11)
RDW: 15 % (ref 11.5–15.5)
WBC: 9.2 10*3/uL (ref 4.0–10.5)

## 2012-04-21 LAB — WET PREP, GENITAL
Clue Cells Wet Prep HPF POC: NONE SEEN
Trich, Wet Prep: NONE SEEN
Yeast Wet Prep HPF POC: NONE SEEN

## 2012-04-21 NOTE — ED Notes (Signed)
MD at bedside. Dr Alwyn Pea at pt bedside, discussing plan of care with said pt.

## 2012-04-21 NOTE — ED Notes (Addendum)
Pt reports vaginal bleeding since yesterday morning.  Reports has been bleeding heavily since yesterday evening.  Pt says has been changing her pad every 15 min.  Pt says was admitted approx a week ago for UTI and deyhdration.  Pt says prior to that admission, she had been bleeding for 20 days.    Pt had ultrasound scheduled yesterday but started bleeding again and pt did not go.  Pt denies abd pain but c/o lower back pain.  Reports dizziness and fatigue.

## 2012-04-21 NOTE — ED Notes (Signed)
Called to pt room secondary to bleeding. Moderate size blood clot noted on floor along with "drops of blood on floor". Blood also noted on hospital gown. Assisted pt with cleaning self up. Pt states started bleeding yesterday, has had to "change pad every 15 minutes". Pt states has had multiple clots  X 24 hrs.

## 2012-04-21 NOTE — ED Provider Notes (Signed)
History     CSN: [id number]  Arrival date & time 04/21/12  1506   First MD Initiated Contact with Patient 04/21/12 1600      Chief Complaint  Patient presents with  . Vaginal Bleeding    HPI Pt was seen at 1625.  Per pt, c/o gradual onset and persistence of intermittent "vaginal bleeding" for the past 3-4 weeks.  Pt states she was due to be eval by OB/GYN Dr. Emelda Fear yesterday but she "started to bleed again with clots" and did not go to the appointment.  Endorses she was admitted to the hospital approx 1 week ago for dx UTI and was "bleeding for 20 days" at that time.  Denies abd pain, no N/V/D, no back pain, no fevers, no CP/SOB.       Past Medical History  Diagnosis Date  . Chronic back pain   . Bipolar 1 disorder   . Anxiety   . Depression   . Hypertension     History reviewed. No pertinent past surgical history.  Family History  Problem Relation Age of Onset  . Hypertension Mother   . Hypertension Brother   . Anxiety disorder Brother   . Depression Brother     History  Substance Use Topics  . Smoking status: Current Everyday Smoker -- 0.5 packs/day    Types: Cigarettes  . Smokeless tobacco: Not on file  . Alcohol Use: No    OB History    Grav Para Term Preterm Abortions TAB SAB Ect Mult Living   2 2 2       2       Review of Systems ROS: Statement: All systems negative except as marked or noted in the HPI; Constitutional: Negative for fever and chills. ; ; Eyes: Negative for eye pain, redness and discharge. ; ; ENMT: Negative for ear pain, hoarseness, nasal congestion, sinus pressure and sore throat. ; ; Cardiovascular: Negative for chest pain, palpitations, diaphoresis, dyspnea and peripheral edema. ; ; Respiratory: Negative for cough, wheezing and stridor. ; ; Gastrointestinal: Negative for nausea, vomiting, diarrhea, abdominal pain, blood in stool, hematemesis, jaundice and rectal bleeding. . ; ; Genitourinary: Negative for dysuria, flank pain and  hematuria. ; ; GYN:  +vaginal bleeding, no vaginal discharge, no vulvar pain.;; Musculoskeletal: Negative for back pain and neck pain. Negative for swelling and trauma.; ; Skin: Negative for pruritus, rash, abrasions, blisters, bruising and skin lesion.; ; Neuro: Negative for headache, lightheadedness and neck stiffness. Negative for weakness, altered level of consciousness , altered mental status, extremity weakness, paresthesias, involuntary movement, seizure and syncope.      Allergies  Review of patient's allergies indicates no known allergies.  Home Medications   Current Outpatient Rx  Name Route Sig Dispense Refill  . ALPRAZOLAM 1 MG PO TABS Oral Take 0.5-1 tablets (0.5-1 mg total) by mouth 4 (four) times daily as needed for anxiety. 30 tablet   . BUPROPION HCL ER (XL) 300 MG PO TB24 Oral Take 300 mg by mouth every morning.    . BUSPIRONE HCL 15 MG PO TABS Oral Take 15 mg by mouth 3 (three) times daily.    . CYCLOBENZAPRINE HCL 10 MG PO TABS Oral Take 10 mg by mouth 3 (three) times daily as needed. Muscle spasm    . DULOXETINE HCL 60 MG PO CPEP Oral Take 60 mg by mouth every morning.     . OXYBUTYNIN CHLORIDE 5 MG PO TABS Oral Take 5 mg by mouth 2 (two) times daily.    Marland Kitchen  OXYGEN-HELIUM IN Inhalation Inhale 3 L into the lungs at bedtime.    Marland Kitchen RISPERIDONE 2 MG PO TABS Oral Take 2 mg by mouth at bedtime.    . TRAMADOL HCL 50 MG PO TABS Oral Take 1 tablet (50 mg total) by mouth every 6 (six) hours as needed for pain. pain 30 tablet   . TRAZODONE HCL 100 MG PO TABS Oral Take 2 tablets (200 mg total) by mouth at bedtime.      BP 110/70  Pulse 98  Temp 99 F (37.2 C) (Oral)  Resp 20  Ht 5\' 3"  (1.6 m)  Wt 225 lb (102.059 kg)  BMI 39.86 kg/m2  SpO2 96%  LMP 04/20/2012  Physical Exam 1630: Physical examination:  Nursing notes reviewed; Vital signs and O2 SAT reviewed;  Constitutional: Well developed, Well nourished, Well hydrated, In no acute distress; Head:  Normocephalic, atraumatic;  Eyes: EOMI, PERRL, No scleral icterus; ENMT: Mouth and pharynx normal, Mucous membranes moist; Neck: Supple, Full range of motion, No lymphadenopathy; Cardiovascular: Regular rate and rhythm, No murmur, rub, or gallop; Respiratory: Breath sounds clear & equal bilaterally, No rales, rhonchi, wheezes.  Speaking full sentences with ease, Normal respiratory effort/excursion; Chest: Nontender, Movement normal; Abdomen: Soft, Nontender, Nondistended, Normal bowel sounds;; Pelvic exam performed with permission of pt and female ED RN assist during exam.  External genitalia w/o lesions. Vaginal vault without discharge, several dark clots removed with cotton swab.  Cervix w/o lesions, not friable, GC/chlam and wet prep obtained and sent to lab.  Cervical os with small protruding polyp, not actively bleeding.  Bimanual exam w/o CMT, uterine or adnexal tenderness.;; Extremities: Pulses normal, No tenderness, No edema, No calf edema or asymmetry.; Neuro: AA&Ox3, Major CN grossly intact.  Speech clear. Gait upright and steady.  Climbs on and off stretcher by herself without distress. No gross focal motor or sensory deficits in extremities.; Skin: Color normal, Warm, Dry.   ED Course  Procedures   MDM  MDM Reviewed: nursing note, vitals and previous chart Reviewed previous: labs Interpretation: labs and ultrasound   Results for orders placed during the hospital encounter of 04/21/12  CBC      Component Value Range   WBC 9.2  4.0 - 10.5 K/uL   RBC 3.61 (*) 3.87 - 5.11 MIL/uL   Hemoglobin 10.9 (*) 12.0 - 15.0 g/dL   HCT 16.1 (*) 09.6 - 04.5 %   MCV 89.2  78.0 - 100.0 fL   MCH 30.2  26.0 - 34.0 pg   MCHC 33.9  30.0 - 36.0 g/dL   RDW 40.9  81.1 - 91.4 %   Platelets 310  150 - 400 K/uL  URINALYSIS, ROUTINE W REFLEX MICROSCOPIC      Component Value Range   Color, Urine YELLOW  YELLOW   APPearance CLEAR  CLEAR   Specific Gravity, Urine 1.020  1.005 - 1.030   pH 5.5  5.0 - 8.0   Glucose, UA NEGATIVE   NEGATIVE mg/dL   Hgb urine dipstick NEGATIVE  NEGATIVE   Bilirubin Urine NEGATIVE  NEGATIVE   Ketones, ur NEGATIVE  NEGATIVE mg/dL   Protein, ur NEGATIVE  NEGATIVE mg/dL   Urobilinogen, UA 0.2  0.0 - 1.0 mg/dL   Nitrite NEGATIVE  NEGATIVE   Leukocytes, UA NEGATIVE  NEGATIVE  WET PREP, GENITAL      Component Value Range   Yeast Wet Prep HPF POC NONE SEEN  NONE SEEN   Trich, Wet Prep NONE SEEN  NONE SEEN  Clue Cells Wet Prep HPF POC NONE SEEN  NONE SEEN   WBC, Wet Prep HPF POC FEW (*) NONE SEEN    US Pelvis Complete 04/21/2012  *RADIOLOGY REPORT*  Clinical Data: Vaginal bleeding  TRANSABDOMINAL AND TRANSVAGINAL ULTRASOUND OF PELVIS Technique:  Both transabdominal and transvaginal ultrasound examinations of the pelvis were performed. Transabdominal technique was performed for global imaging of the pelvis including uterus, ovaries, adnexal regions, and pelvic cul-de-sac.  It was necessary to proceed with endovaginal exam following the transabdominal exam to visualize the endometrium.  Comparison:  None  Findings:  Uterus: 4.3 x 3.5 x 8.3 cm, unremarkable.  Endometrium: 4.9 mm in thickness without focal lesion.  Right ovary:  28 x 34 x 41 mm, containing a septated 23 x 29 x 36 mm cyst.  Left ovary: Not visualized on transabdominal or transvaginal scanning.  Other findings: No free fluid  IMPRESSION:  1.  Unremarkable uterus and endometrium. 2.  3.6 cm septated right ovarian cyst. 3.  Left ovary not visualized.  Original Report Authenticated By: Osa Craver, M.D.     Results for Tiffany Lynch, Tiffany Lynch (MRN [id number]) as of 04/21/2012 17:46  Ref. Range 03/30/2012 18:30 03/31/2012 05:17 04/21/2012 16:07  Hemoglobin Latest Range: 12.0-15.0 g/dL 47.8 29.5 (L) 62.1 (L)  HCT Latest Range: 36.0-46.0 % 36.1 30.7 (L) 32.2 (L)      1900:  Pt's VSS.  H/H is stable after reported 3 to 4 weeks of vaginal bleeding.  Pt strongly encouraged by myself and ED RN to keep her scheduled appointments with her OB/GYN  MD for good continuity of care and control of her symptoms.  Dx testing d/w pt and family.  Questions answered.  Verb understanding, agreeable to d/c home with outpt f/u.       Laray Anger, DO 04/22/12 1905

## 2012-04-23 LAB — URINE CULTURE
Colony Count: NO GROWTH
Culture: NO GROWTH

## 2012-04-23 LAB — GC/CHLAMYDIA PROBE AMP, GENITAL
Chlamydia, DNA Probe: NEGATIVE
GC Probe Amp, Genital: NEGATIVE

## 2012-04-27 ENCOUNTER — Other Ambulatory Visit: Payer: Self-pay | Admitting: Obstetrics and Gynecology

## 2012-05-12 ENCOUNTER — Encounter (HOSPITAL_COMMUNITY): Payer: Self-pay | Admitting: Pharmacy Technician

## 2012-05-15 ENCOUNTER — Encounter (HOSPITAL_COMMUNITY): Payer: Self-pay

## 2012-05-15 ENCOUNTER — Encounter (HOSPITAL_COMMUNITY)
Admission: RE | Admit: 2012-05-15 | Discharge: 2012-05-15 | Disposition: A | Discharge status: Home / Self Care | Payer: Medicaid Other | Source: Ambulatory Visit | Attending: Obstetrics and Gynecology | Admitting: Obstetrics and Gynecology

## 2012-05-15 HISTORY — DX: Gastro-esophageal reflux disease without esophagitis: K21.9

## 2012-05-15 HISTORY — DX: Personal history of diseases of the blood and blood-forming organs and certain disorders involving the immune mechanism: Z86.2

## 2012-05-15 HISTORY — DX: Dependence on supplemental oxygen: Z99.81

## 2012-05-15 HISTORY — DX: Sleep apnea, unspecified: G47.30

## 2012-05-15 LAB — CBC WITH DIFFERENTIAL/PLATELET
Basophils Absolute: 0 10*3/uL (ref 0.0–0.1)
Basophils Relative: 0 % (ref 0–1)
Eosinophils Absolute: 0.3 10*3/uL (ref 0.0–0.7)
Eosinophils Relative: 3 % (ref 0–5)
HCT: 34.2 % — ABNORMAL LOW (ref 36.0–46.0)
Hemoglobin: 11.1 g/dL — ABNORMAL LOW (ref 12.0–15.0)
Lymphocytes Relative: 31 % (ref 12–46)
Lymphs Abs: 3.1 10*3/uL (ref 0.7–4.0)
MCH: 29.1 pg (ref 26.0–34.0)
MCHC: 32.5 g/dL (ref 30.0–36.0)
MCV: 89.5 fL (ref 78.0–100.0)
Monocytes Absolute: 0.7 10*3/uL (ref 0.1–1.0)
Monocytes Relative: 7 % (ref 3–12)
Neutro Abs: 5.9 10*3/uL (ref 1.7–7.7)
Neutrophils Relative %: 59 % (ref 43–77)
Platelets: 425 10*3/uL — ABNORMAL HIGH (ref 150–400)
RBC: 3.82 MIL/uL — ABNORMAL LOW (ref 3.87–5.11)
RDW: 14.8 % (ref 11.5–15.5)
WBC: 9.9 10*3/uL (ref 4.0–10.5)

## 2012-05-15 LAB — BASIC METABOLIC PANEL
BUN: 8 mg/dL (ref 6–23)
CO2: 29 mEq/L (ref 19–32)
Calcium: 9.9 mg/dL (ref 8.4–10.5)
Chloride: 96 mEq/L (ref 96–112)
Creatinine, Ser: 0.89 mg/dL (ref 0.50–1.10)
GFR calc Af Amer: 86 mL/min — ABNORMAL LOW (ref >90)
GFR calc non Af Amer: 74 mL/min — ABNORMAL LOW (ref >90)
Glucose, Bld: 98 mg/dL (ref 70–99)
Potassium: 4.3 mEq/L (ref 3.5–5.1)
Sodium: 135 mEq/L (ref 135–145)

## 2012-05-15 LAB — HCG, SERUM, QUALITATIVE: Preg, Serum: NEGATIVE

## 2012-05-15 LAB — RAPID URINE DRUG SCREEN, HOSP PERFORMED
Amphetamines: NOT DETECTED
Barbiturates: NOT DETECTED
Benzodiazepines: NOT DETECTED
Cocaine: NOT DETECTED
Opiates: POSITIVE — AB
Tetrahydrocannabinol: NOT DETECTED

## 2012-05-15 LAB — URINALYSIS, ROUTINE W REFLEX MICROSCOPIC
Bilirubin Urine: NEGATIVE
Glucose, UA: NEGATIVE mg/dL
Ketones, ur: NEGATIVE mg/dL
Leukocytes, UA: NEGATIVE
Nitrite: NEGATIVE
Protein, ur: NEGATIVE mg/dL
Specific Gravity, Urine: <1.005 — ABNORMAL LOW (ref 1.005–1.030)
Urobilinogen, UA: 0.2 mg/dL (ref 0.0–1.0)
pH: 6 (ref 5.0–8.0)

## 2012-05-15 LAB — SURGICAL PCR SCREEN
MRSA, PCR: NEGATIVE
Staphylococcus aureus: NEGATIVE

## 2012-05-15 LAB — URINE MICROSCOPIC-ADD ON

## 2012-05-15 NOTE — Patient Instructions (Signed)
20 Tiffany Lynch  05/15/2012   Your procedure is scheduled on:  Friday, 05/22/12  Report to [facility] [place] [facility].  Call this number if you have problems the morning of surgery: [phone]   Remember:   Do not eat food:After Midnight.  May have clear liquids:until Midnight .  Clear liquids include soda, tea, black coffee, apple or grape juice, broth.  Take these medicines the morning of surgery with A SIP OF WATER: buspar, wellbutrin   Do not wear jewelry, make-up or nail polish.  Do not wear lotions, powders, or perfumes. You may wear deodorant.  Do not shave 48 hours prior to surgery. Men may shave face and neck.  Do not bring valuables to the hospital.  Contacts, dentures or bridgework may not be worn into surgery.  Leave suitcase in the car. After surgery it may be brought to your room.  For patients admitted to the hospital, checkout time is 11:00 AM the day of discharge.   Patients discharged the day of surgery will not be allowed to drive home.  Name and phone number of your driver: family  Special Instructions: CHG Shower Use Special Wash: 1/2 bottle night before surgery and 1/2 bottle morning of surgery.   Please read over the following fact sheets that you were given: Pain Booklet, MRSA Information, Surgical Site Infection Prevention, Anesthesia Post-op Instructions and Care and Recovery After Surgery  Endometrial Ablation Endometrial ablation removes the lining of the uterus (endometrium). It is usually a same day, outpatient treatment. Ablation helps avoid major surgery (such as a hysterectomy). A hysterectomy is removal of the cervix and uterus. Endometrial ablation has less risk and complications, has a shorter recovery period and is less expensive. After endometrial ablation, most women will have little or no menstrual bleeding. You may not keep your fertility. Pregnancy is no longer likely after this procedure but if you are pre-menopausal, you still need to use a reliable  method of birth control following the procedure because pregnancy can occur. REASONS TO HAVE THE PROCEDURE MAY INCLUDE:  Heavy periods.   Bleeding that is causing anemia.   Anovulatory bleeding, very irregular, bleeding.   Bleeding submucous fibroids (on the lining inside the uterus) if they are smaller than 3 centimeters.  REASONS NOT TO HAVE THE PROCEDURE MAY INCLUDE:  You wish to have more children.   You have a pre-cancerous or cancerous problem. The cause of any abnormal bleeding must be diagnosed before having the procedure.   You have pain coming from the uterus.   You have a submucus fibroid larger than 3 centimeters.   You recently had a baby.   You recently had an infection in the uterus.   You have a severe retro-flexed, tipped uterus and cannot insert the instrument to do the ablation.   You had a Cesarean section or deep major surgery on the uterus.   The inner cavity of the uterus is too large for the endometrial ablation instrument.  RISKS AND COMPLICATIONS   Perforation of the uterus.   Bleeding.   Infection of the uterus, bladder or vagina.   Injury to surrounding organs.   Cutting the cervix.   An air bubble to the lung (air embolus).   Pregnancy following the procedure.   Failure of the procedure to help the problem requiring hysterectomy.   Decreased ability to diagnose cancer in the lining of the uterus.  BEFORE THE PROCEDURE  The lining of the uterus must be tested to make  sure there is no pre-cancerous or cancer cells present.   Medications may be given to make the lining of the uterus thinner.   Ultrasound may be used to evaluate the size and look for abnormalities of the uterus.   Future pregnancy is not desired.  PROCEDURE  There are different ways to destroy the lining of the uterus.   Resectoscope - radio frequency-alternating electric current is the most common one used.   Cryotherapy - freezing the lining of the uterus.    Heated Free Liquid - heated salt (saline) solution inserted into the uterus.   Microwave - uses high energy microwaves in the uterus.   Thermal Balloon - a catheter with a balloon tip is inserted into the uterus and filled with heated fluid.  Your caregiver will talk with you about the method used in this clinic. They will also instruct you on the pros and cons of the procedure. Endometrial ablation is performed along with a procedure called operative hysteroscopy. A narrow viewing tube is inserted through the birth canal (vagina) and through the cervix into the uterus. A tiny camera attached to the viewing tube (hysteroscope) allows the uterine cavity to be shown on a TV monitor during surgery. Your uterus is filled with a harmless liquid to make the procedure easier. The lining of the uterus is then removed. The lining can also be removed with a resectoscope which allows your surgeon to cut away the lining of the uterus under direct vision. Usually, you will be able to go home within an hour after the procedure. HOME CARE INSTRUCTIONS   Do not drive for 24 hours.   No tampons, douching or intercourse for 2 weeks or until your caregiver approves.   Rest at home for 24 to 48 hours. You may then resume normal activities unless told differently by your caregiver.   Take your temperature two times a day for 4 days, and record it.   Take any medications your caregiver has ordered, as directed.   Use some form of contraception if you are pre-menopausal and do not want to get pregnant.  Bleeding after the procedure is normal. It varies from light spotting and mildly watery to bloody discharge for 4 to 6 weeks. You may also have mild cramping. Only take over-the-counter or prescription medicines for pain, discomfort, or fever as directed by your caregiver. Do not use aspirin, as this may aggravate bleeding. Frequent urination during the first 24 hours is normal. You will not know how effective your  surgery is until at least 3 months after the surgery. SEEK IMMEDIATE MEDICAL CARE IF:   Bleeding is heavier than a normal menstrual cycle.   An oral temperature above 102 F (38.9 C) develops.   You have increasing cramps or pains not relieved with medication or develop belly (abdominal) pain which does not seem to be related to the same area of earlier cramping and pain.   You are light headed, weak or have fainting episodes.   You develop pain in the shoulder strap areas.   You have chest or leg pain.   You have abnormal vaginal discharge.   You have painful urination.  Document Released: 07/05/2004 Document Revised: 08/15/2011 Document Reviewed: 10/03/2007 [facility] Patient Information 2012 [place], [place].Dilation and Curettage or Vacuum Curettage Dilation and curettage (D&C) and vacuum curettage are minor procedures. A D&C involves stretching (dilation) the cervix and scraping (curettage) the inside lining of the womb (uterus). During a D&C, tissue is gently scraped from the  inside lining of the uterus. During a vacuum curettage, the lining and tissue in the uterus are removed with the use of gentle suction. Curettage may be performed for diagnostic or therapeutic purposes. As a diagnostic procedure, curettage is performed for the purpose of examining tissues from the uterus. Tissue examination may help determine causes or treatment options for symptoms. A diagnostic curettage may be performed for the following symptoms:  Irregular bleeding in the uterus.   Bleeding with the development of clots.   Spotting between menstrual periods.   Prolonged menstrual periods.   Bleeding after menopause.   No menstrual period (amenorrhea).   A change in size and shape of the uterus.  A therapeutic curettage is performed to remove tissue, blood, or a contraceptive device. Therapeutic curettage may be performed for the following conditions:   Removal of an IUD (intrauterine device).    Removal of retained placenta after giving birth. Retained placenta can cause bleeding severe enough to require transfusions or an infection.   Abortion.   Miscarriage.   Removal of polyps inside the uterus.   Removal of uncommon types of fibroids (noncancerous lumps).  LET YOUR CAREGIVER KNOW ABOUT:   Allergies to food or medicine.   Medicines taken, including vitamins, herbs, eyedrops, over-the-counter medicines, and creams.   Use of steroids (by mouth or creams).   Previous problems with anesthetics or numbing medicines.   History of bleeding problems or blood clots.   Previous surgery.   Other health problems, including diabetes and kidney problems.   Possibility of pregnancy, if this applies.  RISKS AND COMPLICATIONS   Excessive bleeding.   Infection of the uterus.   Damage to the cervix.   Development of scar tissue (adhesions) inside the uterus, later causing abnormal amounts of menstrual bleeding.   Complications from the general anesthetic, if a general anesthetic is used.   Putting a hole (perforation) in the uterus. This is rare.  BEFORE THE PROCEDURE   Eat and drink before the procedure only as directed by your caregiver.   Arrange for someone to take you home.  PROCEDURE   This procedure may be done in a hospital, outpatient clinic, or caregiver's office.   You may be given a general anesthetic or a local anesthetic in and around the cervix.   You will lie on your back with your legs in stirrups.   There are two ways in which your cervix can be softened and dilated. These include:   Taking a medicine.   Having thin rods (laminaria) inserted into your cervix.   A curved tool (curette) will scrape cells from the inside lining of the uterus and will then be removed.  This procedure usually takes about 15 to 30 minutes. AFTER THE PROCEDURE   You will rest in the recovery area until you are stable and are ready to go home.   You will need to  have someone take you home.   You may feel sick to your stomach (nauseous) or throw up (vomit) if you had general anesthesia.   You may have a sore throat if a tube was placed in your throat during general anesthesia.   You may have light cramping and bleeding for 2 days to 2 weeks after the procedure.   Your uterus needs to make a new lining after the procedure. This may make your next period late.  Document Released: 08/26/2005 Document Revised: 08/15/2011 Document Reviewed: 03/24/2009 [facility] Patient Information 2012 [place], [place].

## 2012-05-21 NOTE — OR Nursing (Signed)
Office called and message gave to remind Dr. Emelda Fear that we need his H&P for this case in morning

## 2012-05-22 ENCOUNTER — Ambulatory Visit (HOSPITAL_COMMUNITY)
Admission: RE | Admit: 2012-05-22 | Discharge: 2012-05-22 | Disposition: A | Discharge status: Home / Self Care | Payer: Medicaid Other | Source: Ambulatory Visit | Attending: Obstetrics and Gynecology | Admitting: Obstetrics and Gynecology

## 2012-05-22 ENCOUNTER — Encounter (HOSPITAL_COMMUNITY): Payer: Self-pay | Admitting: *Deleted

## 2012-05-22 ENCOUNTER — Ambulatory Visit (HOSPITAL_COMMUNITY): Payer: Medicaid Other | Admitting: Anesthesiology

## 2012-05-22 ENCOUNTER — Encounter (HOSPITAL_COMMUNITY): Payer: Self-pay | Admitting: Anesthesiology

## 2012-05-22 ENCOUNTER — Encounter (HOSPITAL_COMMUNITY)
Admission: RE | Disposition: A | Discharge status: Home / Self Care | Payer: Self-pay | Source: Ambulatory Visit | Attending: Obstetrics and Gynecology

## 2012-05-22 DIAGNOSIS — N949 Unspecified condition associated with female genital organs and menstrual cycle: Secondary | ICD-10-CM | POA: Insufficient documentation

## 2012-05-22 DIAGNOSIS — N840 Polyp of corpus uteri: Secondary | ICD-10-CM | POA: Insufficient documentation

## 2012-05-22 DIAGNOSIS — J449 Chronic obstructive pulmonary disease, unspecified: Secondary | ICD-10-CM | POA: Diagnosis present

## 2012-05-22 DIAGNOSIS — N938 Other specified abnormal uterine and vaginal bleeding: Secondary | ICD-10-CM | POA: Insufficient documentation

## 2012-05-22 DIAGNOSIS — I10 Essential (primary) hypertension: Secondary | ICD-10-CM | POA: Insufficient documentation

## 2012-05-22 DIAGNOSIS — N925 Other specified irregular menstruation: Secondary | ICD-10-CM | POA: Insufficient documentation

## 2012-05-22 DIAGNOSIS — Z01812 Encounter for preprocedural laboratory examination: Secondary | ICD-10-CM | POA: Insufficient documentation

## 2012-05-22 DIAGNOSIS — Z5309 Procedure and treatment not carried out because of other contraindication: Secondary | ICD-10-CM | POA: Insufficient documentation

## 2012-05-22 DIAGNOSIS — J4489 Other specified chronic obstructive pulmonary disease: Secondary | ICD-10-CM | POA: Insufficient documentation

## 2012-05-22 HISTORY — PX: POLYPECTOMY: SHX5525

## 2012-05-22 SURGERY — DILATATION & CURETTAGE/HYSTEROSCOPY WITH THERMACHOICE ABLATION
Anesthesia: Spinal | Wound class: Clean Contaminated

## 2012-05-22 MED ORDER — FENTANYL CITRATE 0.05 MG/ML IJ SOLN
25.0000 ug | INTRAMUSCULAR | Status: DC | PRN
  Administered 2012-05-22 (×2): 50 ug via INTRAVENOUS

## 2012-05-22 MED ORDER — KETOROLAC TROMETHAMINE 30 MG/ML IJ SOLN
30.0000 mg | Freq: Once | INTRAMUSCULAR | Status: AC
  Administered 2012-05-22: 30 mg via INTRAVENOUS

## 2012-05-22 MED ORDER — MIDAZOLAM HCL 2 MG/2ML IJ SOLN
INTRAMUSCULAR | Status: AC
  Filled 2012-05-22: qty 2

## 2012-05-22 MED ORDER — PROPOFOL 10 MG/ML IV EMUL
INTRAVENOUS | Status: AC
  Filled 2012-05-22: qty 20

## 2012-05-22 MED ORDER — PROPOFOL INFUSION 10 MG/ML OPTIME
INTRAVENOUS | Status: DC | PRN
  Administered 2012-05-22: 75 ug/kg/min via INTRAVENOUS

## 2012-05-22 MED ORDER — CEFAZOLIN SODIUM-DEXTROSE 2-3 GM-% IV SOLR
INTRAVENOUS | Status: AC
  Filled 2012-05-22: qty 50

## 2012-05-22 MED ORDER — FENTANYL CITRATE 0.05 MG/ML IJ SOLN
INTRAMUSCULAR | Status: AC
  Filled 2012-05-22: qty 2

## 2012-05-22 MED ORDER — SODIUM CHLORIDE 0.9 % IR SOLN
Status: DC | PRN
  Administered 2012-05-22: 3000 mL

## 2012-05-22 MED ORDER — CEFAZOLIN SODIUM-DEXTROSE 2-3 GM-% IV SOLR: 2.0000 g | INTRAVENOUS | Status: DC

## 2012-05-22 MED ORDER — BUPIVACAINE-EPINEPHRINE 0.5% -1:200000 IJ SOLN
INTRAMUSCULAR | Status: DC | PRN
  Administered 2012-05-22: 10 mL

## 2012-05-22 MED ORDER — GLYCOPYRROLATE 0.2 MG/ML IJ SOLN
0.2000 mg | Freq: Once | INTRAMUSCULAR | Status: AC
  Administered 2012-05-22: 0.2 mg via INTRAVENOUS

## 2012-05-22 MED ORDER — FENTANYL CITRATE 0.05 MG/ML IJ SOLN
INTRAMUSCULAR | Status: DC | PRN
  Administered 2012-05-22 (×2): 25 ug via INTRAVENOUS
  Administered 2012-05-22: 25 ug via INTRATHECAL
  Administered 2012-05-22: 25 ug via INTRAVENOUS

## 2012-05-22 MED ORDER — LACTATED RINGERS IV SOLN
INTRAVENOUS | Status: DC
  Administered 2012-05-22: 1000 mL via INTRAVENOUS

## 2012-05-22 MED ORDER — LIDOCAINE IN DEXTROSE 5-7.5 % IV SOLN
INTRAVENOUS | Status: DC | PRN
  Administered 2012-05-22: 1.5 mL via INTRATHECAL

## 2012-05-22 MED ORDER — CEFAZOLIN SODIUM 1-5 GM-% IV SOLN
INTRAVENOUS | Status: DC | PRN
  Administered 2012-05-22: 2 g via INTRAVENOUS

## 2012-05-22 MED ORDER — GLYCOPYRROLATE 0.2 MG/ML IJ SOLN
INTRAMUSCULAR | Status: AC
  Filled 2012-05-22: qty 1

## 2012-05-22 MED ORDER — KETOROLAC TROMETHAMINE 10 MG PO TABS: 10.0000 mg | ORAL_TABLET | Freq: Four times a day (QID) | ORAL | Status: AC | PRN

## 2012-05-22 MED ORDER — LIDOCAINE HCL (PF) 1 % IJ SOLN
INTRAMUSCULAR | Status: AC
  Filled 2012-05-22: qty 5

## 2012-05-22 MED ORDER — ONDANSETRON HCL 4 MG/2ML IJ SOLN: 4.0000 mg | Freq: Once | INTRAMUSCULAR | Status: DC | PRN

## 2012-05-22 MED ORDER — KETOROLAC TROMETHAMINE 30 MG/ML IJ SOLN
INTRAMUSCULAR | Status: AC
  Filled 2012-05-22: qty 1

## 2012-05-22 MED ORDER — MIDAZOLAM HCL 2 MG/2ML IJ SOLN
1.0000 mg | INTRAMUSCULAR | Status: DC | PRN
  Administered 2012-05-22: 2 mg via INTRAVENOUS

## 2012-05-22 MED ORDER — BUPIVACAINE-EPINEPHRINE PF 0.5-1:200000 % IJ SOLN
INTRAMUSCULAR | Status: AC
  Filled 2012-05-22: qty 10

## 2012-05-22 MED ORDER — LIDOCAINE HCL (CARDIAC) 10 MG/ML IV SOLN
INTRAVENOUS | Status: DC | PRN
  Administered 2012-05-22: 50 mg via INTRAVENOUS

## 2012-05-22 MED ORDER — KETOROLAC TROMETHAMINE 10 MG PO TABS: 10.0000 mg | ORAL_TABLET | Freq: Four times a day (QID) | ORAL | Status: DC | PRN

## 2012-05-22 MED ORDER — LACTATED RINGERS IV SOLN
INTRAVENOUS | Status: DC | PRN
  Administered 2012-05-22: 07:00:00 via INTRAVENOUS

## 2012-05-22 MED ORDER — ARTIFICIAL TEARS OP OINT
TOPICAL_OINTMENT | OPHTHALMIC | Status: AC
  Filled 2012-05-22: qty 3.5

## 2012-05-22 MED ORDER — DEXTROSE 5 % IV SOLN
INTRAVENOUS | Status: DC | PRN
  Administered 2012-05-22: 500 mL via INTRAVENOUS

## 2012-05-22 SURGICAL SUPPLY — 27 items
BAG DECANTER FOR FLEXI CONT (MISCELLANEOUS) ×2 IMPLANT
BAG HAMPER (MISCELLANEOUS) ×2 IMPLANT
CATH ROBINSON RED A/P 16FR (CATHETERS) ×2 IMPLANT
CATH THERMACHOICE III (CATHETERS) ×2 IMPLANT
CLOTH BEACON ORANGE TIMEOUT ST (SAFETY) ×2 IMPLANT
COVER LIGHT HANDLE STERIS (MISCELLANEOUS) ×4 IMPLANT
FORMALIN 10 PREFIL 480ML (MISCELLANEOUS) ×2 IMPLANT
GLOVE BIOGEL PI IND STRL 7.0 (GLOVE) IMPLANT
GLOVE BIOGEL PI INDICATOR 7.0 (GLOVE) ×1
GLOVE ECLIPSE 6.5 STRL STRAW (GLOVE) ×1 IMPLANT
GLOVE ECLIPSE 9.0 STRL (GLOVE) ×2 IMPLANT
GLOVE EXAM NITRILE MD LF STRL (GLOVE) ×1 IMPLANT
GLOVE INDICATOR STER SZ 9 (GLOVE) ×2 IMPLANT
GOWN STRL REIN 3XL LVL4 (GOWN DISPOSABLE) ×2 IMPLANT
GOWN STRL REIN XL XLG (GOWN DISPOSABLE) ×3 IMPLANT
INST SET HYSTEROSCOPY (KITS) ×2 IMPLANT
IV D5W 500ML (IV SOLUTION) ×2 IMPLANT
IV NS IRRIG 3000ML ARTHROMATIC (IV SOLUTION) ×2 IMPLANT
KIT ROOM TURNOVER AP CYSTO (KITS) ×2 IMPLANT
MANIFOLD NEPTUNE II (INSTRUMENTS) ×2 IMPLANT
MANIFOLD NEPTUNE WASTE (CANNULA) ×2 IMPLANT
NS IRRIG 1000ML POUR BTL (IV SOLUTION) ×2 IMPLANT
PACK PERI GYN (CUSTOM PROCEDURE TRAY) ×2 IMPLANT
PAD ARMBOARD 7.5X6 YLW CONV (MISCELLANEOUS) ×2 IMPLANT
PAD TELFA 3X4 1S STER (GAUZE/BANDAGES/DRESSINGS) ×2 IMPLANT
SET BASIN LINEN APH (SET/KITS/TRAYS/PACK) ×2 IMPLANT
SET IRRIG Y TYPE TUR BLADDER L (SET/KITS/TRAYS/PACK) ×2 IMPLANT

## 2012-05-22 NOTE — H&P (Signed)
Tiffany Lynch is an 51 y.o. female. She is admitted through day surgery for removal of an endometrial polyp, and probable endometrial ablation. The endometrial polyp is known has been biopsied in our office. And is known to be benign. The ultrasound measured the uterus at 6.5 x 4.5 a 4.3 cm with no myometrial masses noted the polyp measures 14 mm asymmetric, and appears to be on a somewhat thickened  stalk, , and wants polyp was removed endometrial ablation may or may not be required depending on clinical findings. Procedures been reviewed including potential risks including uterine perforation or other reasons requiring discontinuation of procedure  Pertinent Gynecological History: Menses: flow is light Bleeding: Still having periods that are regular Contraception: tubal ligation DES exposure: unknown Blood transfusions: none Sexually transmitted diseases: no past history Previous GYN Procedures: Removal of portion of endometrial polyp once was found to be prolapsed in our office in August  Last mammogram: Not applicable Date:  Last pap: normal Date: 2013 OB History: G2, P2   Menstrual History: Menarche age: 89 Patient's last menstrual period was 05/06/2012.    Past Medical History  Diagnosis Date  . Chronic back pain   . Bipolar 1 disorder   . Anxiety   . Depression   . Hypertension   . Sleep apnea   . On home oxygen therapy     3LPM PRN  . History of anemia   . GERD (gastroesophageal reflux disease)     Past Surgical History  Procedure Date  . Dilation and curettage of uterus age 58    Family History  Problem Relation Age of Onset  . Hypertension Mother   . Hypertension Brother   . Anxiety disorder Brother   . Depression Brother     Social History:  reports that she has been smoking Cigarettes.  She has a 17.5 pack-year smoking history. She does not have any smokeless tobacco history on file. She reports that she drinks alcohol. She reports that she uses illicit  drugs (Marijuana, Hydrocodone, and Benzodiazepines).  Allergies: No Known Allergies  Prescriptions prior to admission  Medication Sig Dispense Refill  . ALPRAZolam (XANAX) 1 MG tablet Take 0.5-1 tablets (0.5-1 mg total) by mouth 4 (four) times daily as needed for anxiety.  30 tablet    . buPROPion (WELLBUTRIN XL) 300 MG 24 hr tablet Take 300 mg by mouth every morning.      . busPIRone (BUSPAR) 15 MG tablet Take 15 mg by mouth 3 (three) times daily.      . cyclobenzaprine (FLEXERIL) 10 MG tablet Take 10 mg by mouth 3 (three) times daily as needed. Muscle spasm      . DULoxetine (CYMBALTA) 60 MG capsule Take 60 mg by mouth every morning.       Marland Kitchen oxybutynin (DITROPAN) 5 MG tablet Take 5 mg by mouth 2 (two) times daily.      . OXYGEN-HELIUM IN Inhale 3 L into the lungs at bedtime.      . risperiDONE (RISPERDAL) 2 MG tablet Take 2 mg by mouth at bedtime.      . traMADol (ULTRAM) 50 MG tablet Take 1 tablet (50 mg total) by mouth every 6 (six) hours as needed for pain. pain  30 tablet    . traZODone (DESYREL) 100 MG tablet Take 2 tablets (200 mg total) by mouth at bedtime.        ROS recent admission August for dehydration and urinary tract infection  Blood pressure 150/99, pulse 91, temperature  98.3 F (36.8 C), temperature source Oral, resp. rate 20, last menstrual period 05/06/2012, SpO2 94.00%. Physical Exam Physical Examination: General appearance - alert, well appearing, and in no distress, oriented to person, place, and time and overweight Mental status - alert, oriented to person, place, and time, normal mood, behavior, speech, dress, motor activity, and thought processes Chest - clear to auscultation, no wheezes, rales or rhonchi, symmetric air entry Heart - normal rate and regular rhythm Abdomen - soft, nontender, nondistended, no masses or organomegaly Pelvic - VULVA: normal appearing vulva with no masses, tenderness or lesions, VAGINA: normal appearing vagina with normal color and  discharge, no lesions, CERVIX: normal appearing cervix without discharge or lesions, no discharge noted, UTERUS: uterus is normal size, shape, consistency and nontender, ultrasound measurements are 6.5 cm x 4.5 cm x 4.3 cm with a is endometrial polyp measuring 14.3 mm. Right adnexa shows small ovary 2.3 cm maximum diameter and similarly small left ovary Skin - normal coloration and turgor, no rashes, no suspicious skin lesions noted No results found for this or any previous visit (from the past 24 hour(s)).  No results found.  Assessment/Plan: Residual endometrial polyp for excision for hysteroscopy D&C with removal of polyp: May require endometrial ablation to address any suspected residual polyp stump   Ingvald Theisen V 05/22/2012, 7:17 AM

## 2012-05-22 NOTE — Anesthesia Procedure Notes (Addendum)
Date/Time: 05/22/2012 7:37 AM Performed by: Carolyne Littles, Henryk Ursin L Pre-anesthesia Checklist: Patient identified, Patient being monitored, Emergency Drugs available, Timeout performed and Suction available Oxygen Delivery Method: Non-rebreather mask   Spinal  Patient location during procedure: OR Start time: 05/22/2012 7:53 AM Staffing CRNA/Resident: ANDRAZA, Deontrae Drinkard L Preanesthetic Checklist Completed: patient identified, site marked, surgical consent, pre-op evaluation, timeout performed, IV checked, risks and benefits discussed and monitors and equipment checked Spinal Block Patient position: sitting Prep: Betadine Patient monitoring: heart rate, cardiac monitor, continuous pulse ox and blood pressure Approach: midline Location: L3-4 Injection technique: single-shot Needle Needle type: Spinocan  Needle gauge: 22 G Needle length: 9 cm Assessment Sensory level: T8 Additional Notes  ATTEMPTS:1 TRAY JY:[id number] TRAY EXPIRATION DATE:03/2013 Lidocaine.5% fentanyl 25 mcg injected intrathecally @0753 ; Patient tolerated well.

## 2012-05-22 NOTE — Transfer of Care (Signed)
Immediate Anesthesia Transfer of Care Note  Patient: Tiffany Lynch  Procedure(s) Performed: Procedure(s) (LRB) with comments: DILATATION & CURETTAGE/HYSTEROSCOPY WITH THERMACHOICE ABLATION (N/A) - Hysteroscopy D&C with endometrial ablation and endometrial polyp removal  Patient Location: PACU  Anesthesia Type: Spinal  Level of Consciousness: awake, alert , oriented and patient cooperative  Airway & Oxygen Therapy: Patient Spontanous Breathing and Patient connected to face mask oxygen  Post-op Assessment: Report given to PACU RN and Post -op Vital signs reviewed and stable  Post vital signs: Reviewed and stable  Complications: No apparent anesthesia complications

## 2012-05-22 NOTE — Op Note (Signed)
Procedure hysteroscopy dilation and curettage removal of endometrial polyp, Endometrial ablation discontinued Details of procedure: Patient was taken operating room spinal anesthesia introduced, and speculum inserted after timeout confirmed by surgical team and Ancef 2 g administered. Cervix was grasped with single-tooth tenaculum and was sounded in the anteflexed position to 7.5 cm. Dilation to 39 [place] follow the patient had several episodes of marked coughing during this portion of the procedure number related to her COPD chronic smoking. Exploratory laparoscopy and rigid 30 hysteroscope was then introduced identifying endometrial polyp originating from the fundal portion of the uterine cavity. The hysteroscopic biopsy scissors were then used to partially transect the pedicle was  then Randall stone forceps used to grasp and a pulse the polyp in fragments. Hysteroscopic reinspection was performed until the pedicle of the polyp was completely removed. Final photograph reveals a clearly defined endometrial cavity with no evidence of perforation and 2 tubal ostia visualized. At this time it was decided to proceed with endometrial ablation to address her menses. The Gynecare ThermaChoice 3 endometrial ablation device was repaired inserted and insufflated to 8 cc D5W and thermal ablation sequence initiated.Marland Kitchen Unfortunately she developed another paroxysm of coughing and the Valsalva affect expelled the ablation device from the cervix, and causing discontinuation of the procedure approximately 20 seconds into the 8 minute process. All 8 cc of fluid was recovered. The cervix and vagina were inspected there was no evidence of thermal injury to either organ. The patient was not having any significant bleeding. The paracervical block with Marcaine 10 cc was then injected and patient allowed to go to recovery room in stable condition sponge and needle counts correct

## 2012-05-22 NOTE — Brief Op Note (Signed)
05/22/2012  9:05 AM  PATIENT:  Tiffany Lynch  51 y.o. female  PRE-OPERATIVE DIAGNOSIS:  dysfunctional uterine bleeding endometrial polyp  POST-OPERATIVE DIAGNOSIS:  dysfunctional uterine bleeding endometrial polyp  PROCEDURE:  Procedure(s) (LRB) with comments: DILATATION & CURETTAGE/HYSTEROSCOPY WITH THERMACHOICE ABLATION (N/A) - Hysteroscopy D&C with endometrial ablation and endometrial polyp removal discontinued endometrial ablation.  SURGEON:  Surgeon(s) and Role:    * Tilda Burrow, MD - Primary  PHYSICIAN ASSISTANT:   ASSISTANTS: none    ANESTHESIA:   spinal  EBL:  Total I/O In: 400 [I.V.:400] Out: 50 [Urine:50]  BLOOD ADMINISTERED:none  DRAINS: none   LOCAL MEDICATIONS USED:  MARCAINE    and Amount: 10 ml  SPECIMEN:  Source of Specimen:  endometrial polyp  DISPOSITION OF SPECIMEN:  PATHOLOGY  COUNTS:  YES  TOURNIQUET:  * No tourniquets in log *  DICTATION: .Dragon Dictation  PLAN OF CARE: Admit to inpatient   PATIENT DISPOSITION:  PACU - hemodynamically stable.   Delay start of Pharmacological VTE agent (>24hrs) due to surgical blood loss or risk of bleeding: not applicable

## 2012-05-22 NOTE — Anesthesia Preprocedure Evaluation (Signed)
Anesthesia Evaluation  Patient identified by MRN, date of birth, ID band Patient awake    Reviewed: Allergy & Precautions, H&P , NPO status , Patient's Chart, lab work & pertinent test results  Airway  TM Distance: >3 FB   Mouth opening: Limited Mouth Opening  Dental  (+) Teeth Intact and Partial Lower   Pulmonary sleep apnea , COPD (recent seasonal allergies, hoarsness.)   + decreased breath sounds      Cardiovascular hypertension, Pt. on medications Rhythm:Regular Rate:Normal     Neuro/Psych PSYCHIATRIC DISORDERS Anxiety Depression    GI/Hepatic GERD-  Medicated and Controlled,  Endo/Other    Renal/GU      Musculoskeletal   Abdominal   Peds  Hematology   Anesthesia Other Findings   Reproductive/Obstetrics                           Anesthesia Physical Anesthesia Plan  ASA: III  Anesthesia Plan: Spinal   Post-op Pain Management:    Induction:   Airway Management Planned: Nasal Cannula  Additional Equipment:   Intra-op Plan:   Post-operative Plan:   Informed Consent: I have reviewed the patients History and Physical, chart, labs and discussed the procedure including the risks, benefits and alternatives for the proposed anesthesia with the patient or authorized representative who has indicated his/her understanding and acceptance.     Plan Discussed with:   Anesthesia Plan Comments:         Anesthesia Quick Evaluation

## 2012-05-22 NOTE — Progress Notes (Signed)
Oxygen off at 1045 and O2 sat is 95 %

## 2012-05-22 NOTE — Preoperative (Signed)
Beta Blockers   Reason not to administer Beta Blockers:Not Applicable 

## 2012-05-22 NOTE — Anesthesia Postprocedure Evaluation (Signed)
  Anesthesia Post-op Note  Patient: ELLERY BRIXEY  Procedure(s) Performed: Procedure(s) (LRB) with comments: DILATATION & CURETTAGE/HYSTEROSCOPY WITH THERMACHOICE ABLATION (N/A) - Hysteroscopy D&C with failed endometrial ablation due to valsalva; total ablation time 30 seconds; 7 ml of D5W in balloon; 7 ml returned POLYPECTOMY (N/A) - Endometrial Polypectomy  Patient Location: PACU  Anesthesia Type: Spinal  Level of Consciousness: awake, alert , oriented and patient cooperative  Airway and Oxygen Therapy: Patient Spontanous Breathing and Patient connected to face mask oxygen  Post-op Pain: mild  Post-op Assessment: Post-op Vital signs reviewed, Patient's Cardiovascular Status Stable, Respiratory Function Stable, Patent Airway and No signs of Nausea or vomiting  Post-op Vital Signs: Reviewed and stable  Complications: No apparent anesthesia complications

## 2012-05-25 ENCOUNTER — Encounter (HOSPITAL_COMMUNITY): Payer: Self-pay | Admitting: Obstetrics and Gynecology

## 2012-09-22 ENCOUNTER — Other Ambulatory Visit (HOSPITAL_COMMUNITY): Payer: Self-pay | Admitting: *Deleted

## 2012-09-22 DIAGNOSIS — Z139 Encounter for screening, unspecified: Secondary | ICD-10-CM

## 2012-09-28 ENCOUNTER — Ambulatory Visit (HOSPITAL_COMMUNITY): Payer: Medicaid Other

## 2012-10-05 ENCOUNTER — Ambulatory Visit (HOSPITAL_COMMUNITY): Payer: Medicaid Other

## 2012-10-06 ENCOUNTER — Ambulatory Visit (HOSPITAL_COMMUNITY): Payer: Medicaid Other

## 2012-12-01 ENCOUNTER — Ambulatory Visit (HOSPITAL_COMMUNITY): Payer: Medicaid Other

## 2012-12-10 ENCOUNTER — Encounter (HOSPITAL_COMMUNITY): Payer: Self-pay | Admitting: *Deleted

## 2012-12-10 ENCOUNTER — Emergency Department (HOSPITAL_COMMUNITY): Payer: Medicaid Other

## 2012-12-10 ENCOUNTER — Observation Stay (HOSPITAL_COMMUNITY)
Admission: EM | Admit: 2012-12-10 | Discharge: 2012-12-11 | Disposition: A | Discharge status: Home / Self Care | Payer: Medicaid Other | Attending: Family Medicine | Admitting: Family Medicine

## 2012-12-10 DIAGNOSIS — R0602 Shortness of breath: Secondary | ICD-10-CM | POA: Insufficient documentation

## 2012-12-10 DIAGNOSIS — R5381 Other malaise: Secondary | ICD-10-CM | POA: Insufficient documentation

## 2012-12-10 DIAGNOSIS — R42 Dizziness and giddiness: Secondary | ICD-10-CM | POA: Insufficient documentation

## 2012-12-10 DIAGNOSIS — F3130 Bipolar disorder, current episode depressed, mild or moderate severity, unspecified: Secondary | ICD-10-CM | POA: Insufficient documentation

## 2012-12-10 DIAGNOSIS — I951 Orthostatic hypotension: Principal | ICD-10-CM | POA: Insufficient documentation

## 2012-12-10 DIAGNOSIS — F411 Generalized anxiety disorder: Secondary | ICD-10-CM | POA: Insufficient documentation

## 2012-12-10 DIAGNOSIS — F419 Anxiety disorder, unspecified: Secondary | ICD-10-CM

## 2012-12-10 DIAGNOSIS — I959 Hypotension, unspecified: Secondary | ICD-10-CM

## 2012-12-10 DIAGNOSIS — F319 Bipolar disorder, unspecified: Secondary | ICD-10-CM

## 2012-12-10 DIAGNOSIS — R5383 Other fatigue: Secondary | ICD-10-CM | POA: Insufficient documentation

## 2012-12-10 DIAGNOSIS — E860 Dehydration: Secondary | ICD-10-CM

## 2012-12-10 LAB — URINALYSIS, ROUTINE W REFLEX MICROSCOPIC
Bilirubin Urine: NEGATIVE
Glucose, UA: NEGATIVE mg/dL
Hgb urine dipstick: NEGATIVE
Ketones, ur: NEGATIVE mg/dL
Nitrite: NEGATIVE
Protein, ur: NEGATIVE mg/dL
Specific Gravity, Urine: <1.005 — ABNORMAL LOW (ref 1.005–1.030)
Urobilinogen, UA: 0.2 mg/dL (ref 0.0–1.0)
pH: 6 (ref 5.0–8.0)

## 2012-12-10 LAB — CBC WITH DIFFERENTIAL/PLATELET
Basophils Absolute: 0 10*3/uL (ref 0.0–0.1)
Basophils Relative: 0 % (ref 0–1)
Eosinophils Absolute: 0.3 10*3/uL (ref 0.0–0.7)
Eosinophils Relative: 2 % (ref 0–5)
HCT: 37 % (ref 36.0–46.0)
Hemoglobin: 12.6 g/dL (ref 12.0–15.0)
Lymphocytes Relative: 24 % (ref 12–46)
Lymphs Abs: 3.4 10*3/uL (ref 0.7–4.0)
MCH: 28.9 pg (ref 26.0–34.0)
MCHC: 34.1 g/dL (ref 30.0–36.0)
MCV: 84.9 fL (ref 78.0–100.0)
Monocytes Absolute: 0.8 10*3/uL (ref 0.1–1.0)
Monocytes Relative: 6 % (ref 3–12)
Neutro Abs: 9.4 10*3/uL — ABNORMAL HIGH (ref 1.7–7.7)
Neutrophils Relative %: 68 % (ref 43–77)
Platelets: 352 10*3/uL (ref 150–400)
RBC: 4.36 MIL/uL (ref 3.87–5.11)
RDW: 15.4 % (ref 11.5–15.5)
WBC: 14 10*3/uL — ABNORMAL HIGH (ref 4.0–10.5)

## 2012-12-10 LAB — BASIC METABOLIC PANEL
BUN: 13 mg/dL (ref 6–23)
CO2: 30 mEq/L (ref 19–32)
Calcium: 9 mg/dL (ref 8.4–10.5)
Chloride: 94 mEq/L — ABNORMAL LOW (ref 96–112)
Creatinine, Ser: 1.07 mg/dL (ref 0.50–1.10)
GFR calc Af Amer: 68 mL/min — ABNORMAL LOW (ref >90)
GFR calc non Af Amer: 59 mL/min — ABNORMAL LOW (ref >90)
Glucose, Bld: 124 mg/dL — ABNORMAL HIGH (ref 70–99)
Potassium: 3 mEq/L — ABNORMAL LOW (ref 3.5–5.1)
Sodium: 133 mEq/L — ABNORMAL LOW (ref 135–145)

## 2012-12-10 LAB — D-DIMER, QUANTITATIVE: D-Dimer, Quant: 0.31 ug/mL-FEU (ref 0.00–0.48)

## 2012-12-10 LAB — TROPONIN I: Troponin I: <0.3 ng/mL (ref <0.30)

## 2012-12-10 LAB — URINE MICROSCOPIC-ADD ON

## 2012-12-10 LAB — LACTIC ACID, PLASMA: Lactic Acid, Venous: 0.8 mmol/L (ref 0.5–2.2)

## 2012-12-10 LAB — MAGNESIUM: Magnesium: 2.1 mg/dL (ref 1.5–2.5)

## 2012-12-10 MED ORDER — SODIUM CHLORIDE 0.9 % IJ SOLN
3.0000 mL | Freq: Two times a day (BID) | INTRAMUSCULAR | Status: DC
  Administered 2012-12-10: 3 mL via INTRAVENOUS

## 2012-12-10 MED ORDER — ACETAMINOPHEN 650 MG RE SUPP: 650.0000 mg | Freq: Four times a day (QID) | RECTAL | Status: DC | PRN

## 2012-12-10 MED ORDER — DULOXETINE HCL 60 MG PO CPEP
60.0000 mg | ORAL_CAPSULE | Freq: Every day | ORAL | Status: DC
  Administered 2012-12-11: 60 mg via ORAL
  Filled 2012-12-10: qty 1

## 2012-12-10 MED ORDER — ADULT MULTIVITAMIN W/MINERALS CH
1.0000 | ORAL_TABLET | Freq: Every day | ORAL | Status: DC
  Administered 2012-12-10 – 2012-12-11 (×2): 1 via ORAL
  Filled 2012-12-10 (×2): qty 1

## 2012-12-10 MED ORDER — SODIUM CHLORIDE 0.9 % IV SOLN
INTRAVENOUS | Status: DC
  Administered 2012-12-10 – 2012-12-11 (×2): via INTRAVENOUS

## 2012-12-10 MED ORDER — HYDROCODONE-ACETAMINOPHEN 5-325 MG PO TABS: 1.0000 | ORAL_TABLET | ORAL | Status: DC | PRN

## 2012-12-10 MED ORDER — POTASSIUM CHLORIDE 10 MEQ/100ML IV SOLN
10.0000 meq | Freq: Once | INTRAVENOUS | Status: AC
  Administered 2012-12-10: 10 meq via INTRAVENOUS
  Filled 2012-12-10: qty 100

## 2012-12-10 MED ORDER — SODIUM CHLORIDE 0.9 % IV BOLUS (SEPSIS): 1000.0000 mL | Freq: Once | INTRAVENOUS | Status: DC

## 2012-12-10 MED ORDER — ALBUTEROL SULFATE (5 MG/ML) 0.5% IN NEBU: 2.5000 mg | INHALATION_SOLUTION | RESPIRATORY_TRACT | Status: DC | PRN

## 2012-12-10 MED ORDER — TRAZODONE HCL 50 MG PO TABS
300.0000 mg | ORAL_TABLET | Freq: Every day | ORAL | Status: DC
  Administered 2012-12-10: 300 mg via ORAL
  Filled 2012-12-10: qty 6

## 2012-12-10 MED ORDER — ALBUTEROL SULFATE (5 MG/ML) 0.5% IN NEBU
2.5000 mg | INHALATION_SOLUTION | Freq: Four times a day (QID) | RESPIRATORY_TRACT | Status: DC
  Administered 2012-12-10: 2.5 mg via RESPIRATORY_TRACT
  Filled 2012-12-10: qty 0.5

## 2012-12-10 MED ORDER — FENTANYL CITRATE 0.05 MG/ML IJ SOLN
50.0000 ug | Freq: Once | INTRAMUSCULAR | Status: AC
  Administered 2012-12-10: 50 ug via INTRAVENOUS
  Filled 2012-12-10: qty 2

## 2012-12-10 MED ORDER — ONDANSETRON HCL 4 MG PO TABS: 4.0000 mg | ORAL_TABLET | Freq: Four times a day (QID) | ORAL | Status: DC | PRN

## 2012-12-10 MED ORDER — ALPRAZOLAM 1 MG PO TABS
1.0000 mg | ORAL_TABLET | Freq: Four times a day (QID) | ORAL | Status: DC
  Administered 2012-12-10: 1 mg via ORAL
  Filled 2012-12-10: qty 1

## 2012-12-10 MED ORDER — IPRATROPIUM BROMIDE 0.02 % IN SOLN
0.5000 mg | Freq: Four times a day (QID) | RESPIRATORY_TRACT | Status: DC
  Administered 2012-12-10: 0.5 mg via RESPIRATORY_TRACT
  Filled 2012-12-10: qty 2.5

## 2012-12-10 MED ORDER — SODIUM CHLORIDE 0.9 % IV BOLUS (SEPSIS)
1000.0000 mL | Freq: Once | INTRAVENOUS | Status: AC
  Administered 2012-12-10: 1000 mL via INTRAVENOUS

## 2012-12-10 MED ORDER — BUSPIRONE HCL 5 MG PO TABS
15.0000 mg | ORAL_TABLET | Freq: Two times a day (BID) | ORAL | Status: DC
  Administered 2012-12-11: 15 mg via ORAL
  Filled 2012-12-10: qty 3

## 2012-12-10 MED ORDER — DOCUSATE SODIUM 100 MG PO CAPS
100.0000 mg | ORAL_CAPSULE | Freq: Two times a day (BID) | ORAL | Status: DC
  Administered 2012-12-10 – 2012-12-11 (×2): 100 mg via ORAL
  Filled 2012-12-10 (×2): qty 1

## 2012-12-10 MED ORDER — BUSPIRONE HCL 5 MG PO TABS
15.0000 mg | ORAL_TABLET | Freq: Three times a day (TID) | ORAL | Status: DC
  Administered 2012-12-10: 15 mg via ORAL
  Filled 2012-12-10 (×2): qty 3

## 2012-12-10 MED ORDER — BISACODYL 5 MG PO TBEC: 5.0000 mg | DELAYED_RELEASE_TABLET | Freq: Every day | ORAL | Status: DC | PRN

## 2012-12-10 MED ORDER — ONDANSETRON HCL 4 MG/2ML IJ SOLN: 4.0000 mg | Freq: Four times a day (QID) | INTRAMUSCULAR | Status: DC | PRN

## 2012-12-10 MED ORDER — ONDANSETRON HCL 4 MG/2ML IJ SOLN: 4.0000 mg | Freq: Three times a day (TID) | INTRAMUSCULAR | Status: DC | PRN

## 2012-12-10 MED ORDER — ALPRAZOLAM 1 MG PO TABS
1.0000 mg | ORAL_TABLET | Freq: Two times a day (BID) | ORAL | Status: DC
  Administered 2012-12-11 (×2): 1 mg via ORAL
  Filled 2012-12-10 (×2): qty 1

## 2012-12-10 MED ORDER — POTASSIUM CHLORIDE CRYS ER 20 MEQ PO TBCR
40.0000 meq | EXTENDED_RELEASE_TABLET | Freq: Two times a day (BID) | ORAL | Status: DC
  Administered 2012-12-10 – 2012-12-11 (×2): 40 meq via ORAL
  Filled 2012-12-10 (×2): qty 2

## 2012-12-10 MED ORDER — GABAPENTIN 300 MG PO CAPS
600.0000 mg | ORAL_CAPSULE | Freq: Three times a day (TID) | ORAL | Status: DC
  Administered 2012-12-10 – 2012-12-11 (×3): 600 mg via ORAL
  Filled 2012-12-10 (×3): qty 2

## 2012-12-10 MED ORDER — TRAZODONE HCL 50 MG PO TABS
150.0000 mg | ORAL_TABLET | Freq: Every day | ORAL | Status: DC
  Filled 2012-12-10: qty 3

## 2012-12-10 MED ORDER — ACETAMINOPHEN 325 MG PO TABS: 650.0000 mg | ORAL_TABLET | Freq: Four times a day (QID) | ORAL | Status: DC | PRN

## 2012-12-10 MED ORDER — ENOXAPARIN SODIUM 60 MG/0.6ML ~~LOC~~ SOLN
50.0000 mg | SUBCUTANEOUS | Status: DC
  Administered 2012-12-10: 50 mg via SUBCUTANEOUS
  Filled 2012-12-10: qty 0.6

## 2012-12-10 MED ORDER — OXYBUTYNIN CHLORIDE 5 MG PO TABS
5.0000 mg | ORAL_TABLET | Freq: Three times a day (TID) | ORAL | Status: DC
  Administered 2012-12-10: 5 mg via ORAL
  Filled 2012-12-10: qty 1

## 2012-12-10 MED ORDER — GUAIFENESIN-DM 100-10 MG/5ML PO SYRP: 5.0000 mL | ORAL_SOLUTION | ORAL | Status: DC | PRN

## 2012-12-10 MED ORDER — MAGNESIUM SULFATE 40 MG/ML IJ SOLN
2.0000 g | INTRAMUSCULAR | Status: AC
  Administered 2012-12-10: 2 g via INTRAVENOUS
  Filled 2012-12-10: qty 50

## 2012-12-10 MED ORDER — TRAMADOL HCL 50 MG PO TABS
100.0000 mg | ORAL_TABLET | Freq: Two times a day (BID) | ORAL | Status: DC
  Administered 2012-12-11 (×2): 100 mg via ORAL
  Filled 2012-12-10 (×2): qty 2

## 2012-12-10 MED ORDER — BUPROPION HCL ER (XL) 300 MG PO TB24
300.0000 mg | ORAL_TABLET | Freq: Every morning | ORAL | Status: DC
  Administered 2012-12-11: 300 mg via ORAL
  Filled 2012-12-10 (×4): qty 1

## 2012-12-10 MED ORDER — SODIUM CHLORIDE 0.9 % IV SOLN
INTRAVENOUS | Status: DC
  Administered 2012-12-10: 21:00:00 via INTRAVENOUS

## 2012-12-10 MED ORDER — RISPERIDONE 1 MG PO TABS: 2.0000 mg | ORAL_TABLET | Freq: Every day | ORAL | Status: DC

## 2012-12-10 MED ORDER — HYDROCODONE-ACETAMINOPHEN 5-325 MG PO TABS
1.0000 | ORAL_TABLET | ORAL | Status: DC | PRN
  Administered 2012-12-11 (×2): 1 via ORAL
  Filled 2012-12-10 (×2): qty 1

## 2012-12-10 MED ORDER — ALUM & MAG HYDROXIDE-SIMETH 200-200-20 MG/5ML PO SUSP: 30.0000 mL | Freq: Four times a day (QID) | ORAL | Status: DC | PRN

## 2012-12-10 MED ORDER — RISPERIDONE 1 MG PO TABS
3.0000 mg | ORAL_TABLET | Freq: Every day | ORAL | Status: DC
  Administered 2012-12-10: 3 mg via ORAL
  Filled 2012-12-10: qty 3

## 2012-12-10 NOTE — ED Notes (Signed)
Patient ambulatory to bathroom.  Denies any dizziness upon standing or sitting.

## 2012-12-10 NOTE — ED Notes (Addendum)
bp has been low for app 4-5 days, [facility] here [facility]  No nvd,

## 2012-12-10 NOTE — ED Notes (Signed)
Pt with hypotension x 3-4 days

## 2012-12-10 NOTE — ED Provider Notes (Signed)
History     CSN: [id number]  Arrival date & time 12/10/12  1507   First MD Initiated Contact with Patient 12/10/12 1551      Chief Complaint  Patient presents with  . Hypotension    (Consider location/radiation/quality/duration/timing/severity/associated sxs/prior treatment) HPI Comments: 52 year old female with a history of hypertension, chronic back pain, bipolar disorder, anemia and acid reflux disease. She presents with a complaint of hypotension. She states that over the last 5 days she is felt persistently lightheaded and dizzy when she stands, generally fatigued but also states that she gets short of breath and dyspneic when she tries to walk. She denies swelling in her legs, denies fevers, chills, chest pain, dysuria or diarrhea. She does admit to having a chronic cough and feels like it is gotten slightly worse over the last several days. She denies fevers or chills. She was seen at the health department prior to arrival and was found to be hypotensive, transported by private vehicle to the hospital for further evaluation.  The history is provided by the patient, a friend and medical records.    Past Medical History  Diagnosis Date  . Chronic back pain   . Bipolar 1 disorder   . Anxiety   . Depression   . Hypertension   . Sleep apnea   . On home oxygen therapy     3LPM PRN  . History of anemia   . GERD (gastroesophageal reflux disease)     Past Surgical History  Procedure Laterality Date  . Dilation and curettage of uterus  age 72  . Polypectomy  05/22/2012    Procedure: POLYPECTOMY;  Surgeon: Tilda Burrow, MD;  Location: [facility];  Service: Gynecology;  Laterality: N/A;  Endometrial Polypectomy  . Tubal ligation      Family History  Problem Relation Age of Onset  . Hypertension Mother   . Hypertension Brother   . Anxiety disorder Brother   . Depression Brother     History  Substance Use Topics  . Smoking status: Current Every Day Smoker -- 0.50 packs/day  for 35 years    Types: Cigarettes  . Smokeless tobacco: Not on file  . Alcohol Use: Yes     Comment: occasionally    OB History   Grav Para Term Preterm Abortions TAB SAB Ect Mult Living   2 2 2       2       Review of Systems  All other systems reviewed and are negative.    Allergies  Review of patient's allergies indicates no known allergies.  Home Medications   Current Outpatient Rx  Name  Route  Sig  Dispense  Refill  . ALPRAZolam (XANAX) 1 MG tablet   Oral   Take 1 mg by mouth 4 (four) times daily.         Marland Kitchen buPROPion (WELLBUTRIN XL) 300 MG 24 hr tablet   Oral   Take 300 mg by mouth every morning.         . busPIRone (BUSPAR) 15 MG tablet   Oral   Take 15 mg by mouth 3 (three) times daily.         . cyclobenzaprine (FLEXERIL) 10 MG tablet   Oral   Take 10 mg by mouth 3 (three) times daily as needed. Muscle spasm         . DULoxetine (CYMBALTA) 60 MG capsule   Oral   Take 60 mg by mouth every morning.          Marland Kitchen  gabapentin (NEURONTIN) 600 MG tablet   Oral   Take 600 mg by mouth 3 (three) times daily.         Marland Kitchen oxybutynin (DITROPAN) 5 MG tablet   Oral   Take 5 mg by mouth 3 (three) times daily.          . OXYGEN-HELIUM IN   Inhalation   Inhale 3 L into the lungs at bedtime.         . risperiDONE (RISPERDAL) 3 MG tablet   Oral   Take 3 mg by mouth at bedtime.         . traMADol (ULTRAM) 50 MG tablet   Oral   Take 100 mg by mouth every 6 (six) hours as needed for pain. pain         . traZODone (DESYREL) 100 MG tablet   Oral   Take 300 mg by mouth at bedtime.           BP 103/65  Pulse 80  Temp(Src) 98.2 F (36.8 C) (Oral)  Resp 20  Ht 5\' 3"  (1.6 m)  Wt 228 lb (103.42 kg)  BMI 40.4 kg/m2  SpO2 98%  LMP 05/06/2012  Physical Exam  Nursing note and vitals reviewed. Constitutional: She appears well-developed and well-nourished. No distress.  HENT:  Head: Normocephalic and atraumatic.  Mouth/Throat: No  oropharyngeal exudate.  Mucous membranes appear dehydrated  Eyes: Conjunctivae and EOM are normal. Pupils are equal, round, and reactive to light. Right eye exhibits no discharge. Left eye exhibits no discharge. No scleral icterus.  Neck: Normal range of motion. Neck supple. No JVD present. No thyromegaly present.  Cardiovascular: Normal rate, regular rhythm, normal heart sounds and intact distal pulses.  Exam reveals no gallop and no friction rub.   No murmur heard. Pulmonary/Chest: Effort normal. No respiratory distress. She has no wheezes. She has rales ( Rales at the bases bilaterally).  No increased work of breathing  Abdominal: Soft. Bowel sounds are normal. She exhibits no distension and no mass. There is no tenderness.  Musculoskeletal: Normal range of motion. She exhibits no edema and no tenderness.  Lymphadenopathy:    She has no cervical adenopathy.  Neurological: She is alert. Coordination normal.  Skin: Skin is warm and dry. No rash noted. No erythema.  Psychiatric: She has a normal mood and affect. Her behavior is normal.    ED Course  Procedures (including critical care time)  Labs Reviewed  CBC WITH DIFFERENTIAL - Abnormal; Notable for the following:    WBC 14.0 (*)    Neutro Abs 9.4 (*)    All other components within normal limits  BASIC METABOLIC PANEL - Abnormal; Notable for the following:    Sodium 133 (*)    Potassium 3.0 (*)    Chloride 94 (*)    Glucose, Bld 124 (*)    GFR calc non Af Amer 59 (*)    GFR calc Af Amer 68 (*)    All other components within normal limits  URINALYSIS, ROUTINE W REFLEX MICROSCOPIC - Abnormal; Notable for the following:    Specific Gravity, Urine <1.005 (*)    Leukocytes, UA TRACE (*)    All other components within normal limits  URINE MICROSCOPIC-ADD ON - Abnormal; Notable for the following:    Squamous Epithelial / LPF FEW (*)    Bacteria, UA FEW (*)    All other components within normal limits  URINE CULTURE  LACTIC ACID,  PLASMA  TROPONIN I   Dg  Chest 2 View  12/10/2012  *RADIOLOGY REPORT*  Clinical Data: Shortness of breath, hypertension, past history of hypertension, GERD  CHEST - 2 VIEW  Comparison: Portable exam 1657 hours compared to 03/30/2012  Findings: Upper normal heart size. Normal mediastinal contours and pulmonary vascularity. Minimal chronic peribronchial thickening. No pulmonary infiltrate, pleural effusion or pneumothorax. Bones unremarkable.  IMPRESSION: No acute abnormalities.   Original Report Authenticated By: Ulyses Southward, M.D.      1. Hypotension       MDM  Overall the patient is hypotensive but not tachycardic, not febrile but does have some hypoxia and a cough with rales. Will evaluate for pneumonia, urinary tract infection but would also consider dehydration and medication as sources of her hypotension. She is on multiple different medications including 3 different blood pressure medications.   ED ECG REPORT  I personally interpreted this EKG   Date: 12/10/2012   Rate: 86  Rhythm: normal sinus rhythm  QRS Axis: normal  Intervals: normal  ST/T Wave abnormalities: nonspecific T wave changes  Conduction Disutrbances:none  Narrative Interpretation:   Old EKG Reviewed: Compared with 03/30/2012, no significant changes, low-voltage still present   Discussed with hospitalist, despite no source for the patient's hypotension, she is persistently hypotensive.  There is a leukocytosis but no definite findings to correlate with why the patient should have this.  Mild hypokalemia which has been treated, the patient is not appear dehydrated based on urinalysis.  PA and lateral views of the chest were obtained by digital radiography. I have personally interpreted these x-rays and find her to be no signs of pulmonary infiltrate, cardiomegaly, subdiaphragmatic free air, soft tissue abnormality, no obvious bony abnormalities or fractures.   Vida Roller, MD 12/10/12 [phone]

## 2012-12-11 DIAGNOSIS — E860 Dehydration: Secondary | ICD-10-CM

## 2012-12-11 DIAGNOSIS — F411 Generalized anxiety disorder: Secondary | ICD-10-CM

## 2012-12-11 DIAGNOSIS — I959 Hypotension, unspecified: Secondary | ICD-10-CM

## 2012-12-11 DIAGNOSIS — I951 Orthostatic hypotension: Secondary | ICD-10-CM

## 2012-12-11 DIAGNOSIS — F319 Bipolar disorder, unspecified: Secondary | ICD-10-CM

## 2012-12-11 LAB — BASIC METABOLIC PANEL
BUN: 11 mg/dL (ref 6–23)
CO2: 28 mEq/L (ref 19–32)
Calcium: 8.3 mg/dL — ABNORMAL LOW (ref 8.4–10.5)
Chloride: 105 mEq/L (ref 96–112)
Creatinine, Ser: 0.8 mg/dL (ref 0.50–1.10)
GFR calc Af Amer: >90 mL/min (ref >90)
GFR calc non Af Amer: 83 mL/min — ABNORMAL LOW (ref >90)
Glucose, Bld: 131 mg/dL — ABNORMAL HIGH (ref 70–99)
Potassium: 4 mEq/L (ref 3.5–5.1)
Sodium: 139 mEq/L (ref 135–145)

## 2012-12-11 LAB — CBC
HCT: 36.2 % (ref 36.0–46.0)
Hemoglobin: 11.8 g/dL — ABNORMAL LOW (ref 12.0–15.0)
MCH: 28.1 pg (ref 26.0–34.0)
MCHC: 32.6 g/dL (ref 30.0–36.0)
MCV: 86.2 fL (ref 78.0–100.0)
Platelets: 329 10*3/uL (ref 150–400)
RBC: 4.2 MIL/uL (ref 3.87–5.11)
RDW: 15.8 % — ABNORMAL HIGH (ref 11.5–15.5)
WBC: 9.8 10*3/uL (ref 4.0–10.5)

## 2012-12-11 LAB — CORTISOL: Cortisol, Plasma: 9.9 ug/dL

## 2012-12-11 LAB — TSH: TSH: 1.015 u[IU]/mL (ref 0.350–4.500)

## 2012-12-11 MED ORDER — IPRATROPIUM BROMIDE 0.02 % IN SOLN
0.5000 mg | Freq: Three times a day (TID) | RESPIRATORY_TRACT | Status: DC
  Administered 2012-12-11: 0.5 mg via RESPIRATORY_TRACT
  Filled 2012-12-11: qty 2.5

## 2012-12-11 MED ORDER — RISPERIDONE 2 MG PO TABS: 2.0000 mg | ORAL_TABLET | Freq: Every day | ORAL | Status: DC

## 2012-12-11 MED ORDER — TRAZODONE HCL 100 MG PO TABS: 200.0000 mg | ORAL_TABLET | Freq: Every day | ORAL | Status: DC

## 2012-12-11 MED ORDER — ALPRAZOLAM 1 MG PO TABS: 1.0000 mg | ORAL_TABLET | Freq: Three times a day (TID) | ORAL | Status: DC

## 2012-12-11 MED ORDER — PREDNISONE 20 MG PO TABS
60.0000 mg | ORAL_TABLET | Freq: Every day | ORAL | Status: DC
  Administered 2012-12-11: 60 mg via ORAL
  Filled 2012-12-11: qty 3

## 2012-12-11 MED ORDER — ALBUTEROL SULFATE (5 MG/ML) 0.5% IN NEBU
2.5000 mg | INHALATION_SOLUTION | Freq: Four times a day (QID) | RESPIRATORY_TRACT | Status: DC
  Administered 2012-12-11: 2.5 mg via RESPIRATORY_TRACT
  Filled 2012-12-11: qty 0.5

## 2012-12-11 MED ORDER — IPRATROPIUM BROMIDE 0.02 % IN SOLN
0.5000 mg | Freq: Four times a day (QID) | RESPIRATORY_TRACT | Status: DC
  Administered 2012-12-11: 0.5 mg via RESPIRATORY_TRACT
  Filled 2012-12-11: qty 2.5

## 2012-12-11 MED ORDER — ALBUTEROL SULFATE (5 MG/ML) 0.5% IN NEBU
2.5000 mg | INHALATION_SOLUTION | Freq: Three times a day (TID) | RESPIRATORY_TRACT | Status: DC
  Administered 2012-12-11: 2.5 mg via RESPIRATORY_TRACT
  Filled 2012-12-11: qty 0.5

## 2012-12-11 MED ORDER — TRAMADOL HCL 50 MG PO TABS: 50.0000 mg | ORAL_TABLET | Freq: Four times a day (QID) | ORAL | Status: DC | PRN

## 2012-12-11 NOTE — Progress Notes (Signed)
Patient ambulated in hallway without difficulty,no c/o pain or discomfort noted.See Doc flowsheets for vitals signs after ambulating in hallway.

## 2012-12-11 NOTE — Care Management Note (Signed)
    Page 1 of 1   12/11/2012     12:14:32 PM   CARE MANAGEMENT NOTE 12/11/2012  Patient:  Tiffany Lynch, FURGERSON   Account Number:  [id number]  Date Initiated:  12/11/2012  Documentation initiated by:  Sharrie Rothman  Subjective/Objective Assessment:   Pt admitted from home with hypotension. Pt lives with a significant other and will return home at discharge. Pt is independent with ADL's. Pt is followed by [facility] and they have provided pt with BP cuff and BS machine.     Action/Plan:   No CM needs noted.   Anticipated DC Date:  12/12/2012   Anticipated DC Plan:  HOME/SELF CARE      DC Planning Services  CM consult      Choice offered to / List presented to:             Status of service:  Completed, signed off Medicare Important Message given?   (If response is "NO", the following Medicare IM given date fields will be blank) Date Medicare IM given:   Date Additional Medicare IM given:    Discharge Disposition:  HOME/SELF CARE  Per UR Regulation:    If discussed at Long Length of Stay Meetings, dates discussed:    Comments:  12/11/12 1215 Arlyss Queen, RN BSN CM Pt follows up with [facility] Dept for primary care and pt wants to continue this.

## 2012-12-11 NOTE — Progress Notes (Signed)
[facility] PROGRESS NOTE  BUFFI EWTON OZH:[id number] DOB: 02/03/1961 DOA: 12/10/2012 PCP: Selinda Flavin, MD  Assessment/Plan: 1. Orthostatic hypotension: Resolved with IV fluids. Suspect multifactorial in nature including volume depletion but primarily psychiatric medications, especially risperidal which was recently increased coinciding with onset of symptoms. Of note this medication can cause severe orthostasis. TSH normal. Cortisol normal. I counseled her to decrease the dose back to her previous dose as well as decrease her Xanax and trazodone which can cause orthostasis as well. 2. Fatigue: Improved. Likely secondary to multiple medications. 3. Chronic respiratory failure: This appears be at baseline. No hypoxia on room air. She uses oxygen at night. No shortness of breath no more than usual. 4. Hypokalemia: Repleted. 5. Leukocytosis: Resolved. 6. Bipolar 1 disorder, anxiety, depression: Appears stable. Decrease tramadol, Xanax and risperidal. 7. Chronic back pain: On large doses of tramadol   Code Status: Full code Family Communication: None present Disposition Plan: Home  Brendia Sacks, MD  [facility]  Pager [phone] If 7PM-7AM, please contact night-coverage at [email or web address], password [facility] 12/11/2012, 2:52 PM  LOS: 1 day   Consultants:  None  Procedures:  None  HPI/Subjective: Afebrile, blood pressure improved. Ambulating without difficulty. Feels well. Reports that her risperidal was recently increased from 2-3 mg 4 days ago and that she developed symptoms of dizziness, fatigue and lightheadedness afterwards. She has been on other medications without change.  Objective: Filed Vitals:   12/11/12 0652 12/11/12 0748 12/11/12 1019 12/11/12 1358  BP: 91/64   100/62  Pulse: 89   95  Temp: 98.2 F (36.8 C)   98.4 F (36.9 C)  TempSrc: Oral   Oral  Resp: 20   20  Height:      Weight:      SpO2: 92% 92% 92% 92%    Intake/Output Summary (Last 24 hours)  at 12/11/12 1452 Last data filed at 12/11/12 1100  Gross per 24 hour  Intake    666 ml  Output   1600 ml  Net   -934 ml   Filed Weights   12/10/12 1541 12/10/12 2010  Weight: 103.42 kg (228 lb) 104.6 kg (230 lb 9.6 oz)    Exam:  General:  Appears calm and comfortable Eyes: Grossly normal. Cardiovascular: RRR, no m/r/g. No LE edema. Telemetry: SR, no arrhythmias  Respiratory: CTA bilaterally, no w/r/r. Normal respiratory effort. Musculoskeletal: grossly normal tone BUE/BLE Psychiatric: grossly normal mood and affect, speech fluent and appropriate Neurologic: grossly non-focal.  Data Reviewed: Basic Metabolic Panel:  Recent Labs Lab 12/10/12 1614 12/10/12 2033 12/11/12 0610  NA 133*  --  139  K 3.0*  --  4.0  CL 94*  --  105  CO2 30  --  28  GLUCOSE 124*  --  131*  BUN 13  --  11  CREATININE 1.07  --  0.80  CALCIUM 9.0  --  8.3*  MG  --  2.1  --    CBC:  Recent Labs Lab 12/10/12 1614 12/11/12 0610  WBC 14.0* 9.8  NEUTROABS 9.4*  --   HGB 12.6 11.8*  HCT 37.0 36.2  MCV 84.9 86.2  PLT 352 329   Cardiac Enzymes:  Recent Labs Lab 12/10/12 1614  TROPONINI <0.30     Studies: Dg Chest 2 View  12/10/2012  *RADIOLOGY REPORT*  Clinical Data: Shortness of breath, hypertension, past history of hypertension, GERD  CHEST - 2 VIEW  Comparison: Portable exam 1657 hours compared to 03/30/2012  Findings: Upper normal heart  size. Normal mediastinal contours and pulmonary vascularity. Minimal chronic peribronchial thickening. No pulmonary infiltrate, pleural effusion or pneumothorax. Bones unremarkable.  IMPRESSION: No acute abnormalities.   Original Report Authenticated By: Ulyses Southward, M.D.     Scheduled Meds: . albuterol  2.5 mg Nebulization TID  . ALPRAZolam  1 mg Oral BID  . buPROPion  300 mg Oral q morning - 10a  . busPIRone  15 mg Oral BID  . docusate sodium  100 mg Oral BID  . DULoxetine  60 mg Oral Daily  . enoxaparin (LOVENOX) injection  50 mg Subcutaneous  Q24H  . gabapentin  600 mg Oral TID  . ipratropium  0.5 mg Nebulization TID  . multivitamin with minerals  1 tablet Oral Daily  . potassium chloride  40 mEq Oral BID  . predniSONE  60 mg Oral Daily  . risperiDONE  2 mg Oral QHS  . sodium chloride  1,000 mL Intravenous Once  . sodium chloride  3 mL Intravenous Q12H  . traMADol  100 mg Oral Q12H  . traZODone  150 mg Oral QHS   Continuous Infusions: . sodium chloride 125 mL/hr at 12/10/12 2141    Active Problems:   * No active hospital problems. Brendia Sacks, MD  [facility] Pager [phone] If 7PM-7AM, please contact night-coverage at [email or web address], password [facility] 12/11/2012, 2:52 PM  LOS: 1 day

## 2012-12-11 NOTE — Progress Notes (Signed)
Patient discharged with instructions given on medications,and follow up visits,verbalized understanding.No c/o pain or discomfort noted.Accompanied by staff to an awaiting vehicle. 

## 2012-12-11 NOTE — Discharge Summary (Signed)
Physician Discharge Summary  Tiffany Lynch ZOX:[id number] DOB: 1961-07-14 DOA: 12/10/2012  PCP: Selinda Flavin, MD  Admit date: 12/10/2012 Discharge date: 12/11/2012  Recommendations for Outpatient Follow-up:  1. Followup changes to medications as outlined below. 2. Continue to follow blood pressure. See below.  Follow-up Information   Follow up with REDDY,KESHAVPAL Gerda Diss, MD In 1 week.   Contact information:   [address] [place] [address]      Discharge Diagnoses:  1. Orthostatic hypotension, likely medication induced 2. Fatigue 3. Bipolar disorder, anxiety, depression  Discharge Condition: Improved Disposition: Home  Diet recommendation: Regular  Filed Weights   12/10/12 1541 12/10/12 2010  Weight: 103.42 kg (228 lb) 104.6 kg (230 lb 9.6 oz)    History of present illness:  52 year old woman presented with lightheadedness and dyspnea on exertion. She is found to have hypotension and referred for admission.  Hospital Course:  Ms. Berti was admitted to the hospital for orthostatic hypotension. This resolved rapidly with IV fluids, the patient was able to ambulate in the hall without difficulty with normal blood pressure. She she relates her risperdal was increased 4 days ago from 2mg  to 3 mg and thereafter she developed symptoms of lightheadedness and dizziness. She is also on multiple psychiatric medications as well as tramadol and Flexeril. Laboratory studies were unremarkable and there is no evidence to suggest infection or cardiac etiology. She reports chronic shortness of breath which is unchanged. She related without difficulty. Reported shortness of breath on admission likely related to chronic COPD.  1. Orthostatic hypotension: Resolved with IV fluids. Suspect multifactorial in nature including volume depletion but primarily psychiatric medications, especially risperidal which was recently increased coinciding with onset of symptoms. Of note this  medication can cause severe orthostasis. TSH normal. Cortisol normal. I counseled her to decrease the dose back to her previous dose as well as decrease her Xanax and trazodone which can cause orthostasis as well. 2. Fatigue: Improved. Likely secondary to multiple medications. 3. Chronic respiratory failure: This appears be at baseline. No hypoxia on room air. She uses oxygen at night. No shortness of breath no more than usual. 4. Hypokalemia: Repleted. 5. Leukocytosis: Resolved. 6. Bipolar 1 disorder, anxiety, depression: Appears stable. Decrease tramadol, Xanax and risperidal. 7. Chronic back pain: On large doses of tramadol Consultants:  None  Procedures:  None   Discharge Instructions  Discharge Orders   Future Appointments Provider Department Dept Phone   12/14/2012 8:50 AM [facility] [phone]   Patient should wear two piece clothing and wear no powder or deodorant. Patient should arrive 15 minutes early.   Future Orders Complete By Expires     Diet general  As directed     Discharge instructions  As directed     Comments:      Be sure to followup with your primary care provider and psychiatrist in one week. The most likely cause of your low blood pressure was multiple medications you are on for bipolar disorder, anxiety and depression. It is recommended you decrease your dose of tramadol, Xanax and Risperdal. Call your physician or seek immediate medical attention for lightheadedness, dizziness or worsening of condition.    Increase activity slowly  As directed         Medication List    STOP taking these medications       cyclobenzaprine 10 MG tablet  Commonly known as:  FLEXERIL      TAKE these medications  ALPRAZolam 1 MG tablet  Commonly known as:  XANAX  Take 1 tablet (1 mg total) by mouth 3 (three) times daily.     buPROPion 300 MG 24 hr tablet  Commonly known as:  WELLBUTRIN XL  Take 300 mg by mouth every morning.     busPIRone  15 MG tablet  Commonly known as:  BUSPAR  Take 15 mg by mouth 3 (three) times daily.     DULoxetine 60 MG capsule  Commonly known as:  CYMBALTA  Take 60 mg by mouth every morning.     gabapentin 600 MG tablet  Commonly known as:  NEURONTIN  Take 600 mg by mouth 3 (three) times daily.     oxybutynin 5 MG tablet  Commonly known as:  DITROPAN  Take 5 mg by mouth 3 (three) times daily.     OXYGEN-HELIUM IN  Inhale 3 L into the lungs at bedtime.     risperiDONE 2 MG tablet  Commonly known as:  RISPERDAL  Take 1 tablet (2 mg total) by mouth at bedtime.     traMADol 50 MG tablet  Commonly known as:  ULTRAM  Take 1 tablet (50 mg total) by mouth every 6 (six) hours as needed for pain. pain     traZODone 100 MG tablet  Commonly known as:  DESYREL  Take 2 tablets (200 mg total) by mouth at bedtime.        The results of significant diagnostics from this hospitalization (including imaging, microbiology, ancillary and laboratory) are listed below for reference.    Significant Diagnostic Studies: Dg Chest 2 View  12/10/2012  *RADIOLOGY REPORT*  Clinical Data: Shortness of breath, hypertension, past history of hypertension, GERD  CHEST - 2 VIEW  Comparison: Portable exam 1657 hours compared to 03/30/2012  Findings: Upper normal heart size. Normal mediastinal contours and pulmonary vascularity. Minimal chronic peribronchial thickening. No pulmonary infiltrate, pleural effusion or pneumothorax. Bones unremarkable.  IMPRESSION: No acute abnormalities.   Original Report Authenticated By: Ulyses Southward, M.D.    Labs: Basic Metabolic Panel:  Recent Labs Lab 12/10/12 1614 12/10/12 2033 12/11/12 0610  NA 133*  --  139  K 3.0*  --  4.0  CL 94*  --  105  CO2 30  --  28  GLUCOSE 124*  --  131*  BUN 13  --  11  CREATININE 1.07  --  0.80  CALCIUM 9.0  --  8.3*  MG  --  2.1  --    CBC:  Recent Labs Lab 12/10/12 1614 12/11/12 0610  WBC 14.0* 9.8  NEUTROABS 9.4*  --   HGB 12.6 11.8*   HCT 37.0 36.2  MCV 84.9 86.2  PLT 352 329   Cardiac Enzymes:  Recent Labs Lab 12/10/12 1614  TROPONINI <0.30    Active Problems:   * No active hospital problems. *   Time coordinating discharge: 35 minutes  Signed:  Brendia Sacks, MD [facility] 12/11/2012, 5:02 PM

## 2012-12-11 NOTE — Progress Notes (Signed)
UR Chart Review Completed  

## 2012-12-11 NOTE — H&P (Signed)
[facility] History and Physical  Tiffany Lynch WUJ:[id number] DOB: 1961/05/22 DOA: 12/10/2012  Referring physician: EDP Bednar PCP: Selinda Flavin, MD  Specialists: none  Chief Complaint:   HPI: Tiffany Lynch is a 52 y.o. female bipolar depression, chronic low back pain,HTN, obesity, anxiety, COPD with ongoing tobacco abuse who [facility] and management of hypotension-not on any BP meds at home -she reports a 5 day history of progressively worsening fatigue, dizziness and dyspnea with minimal activity. At baseline she has COPD but it is unclear how advanced, she is not on traditional COPD therapies, uses helium/oxy neb, and also on home O2.  In ED she was orthostatic and her SBP was in the 60's. She responded well to IV fluids, admitted for observation and management. She is on relative high doses of antipsychotics and antidepressants. She also reports that she takes 8 Tramadol a day which is well above daily recommended doses. Admission requested for further evaluation of hypotension.   Review of Systems:   Past Medical History  Diagnosis Date  . Chronic back pain   . Bipolar 1 disorder   . Anxiety   . Depression   . Hypertension   . Sleep apnea   . On home oxygen therapy     3LPM PRN  . History of anemia   . GERD (gastroesophageal reflux disease)    Past Surgical History  Procedure Laterality Date  . Dilation and curettage of uterus  age 82  . Polypectomy  05/22/2012    Procedure: POLYPECTOMY;  Surgeon: Tilda Burrow, MD;  Location: [facility];  Service: Gynecology;  Laterality: N/A;  Endometrial Polypectomy  . Tubal ligation     Social History:  reports that she has been smoking Cigarettes.  She has a 17.5 pack-year smoking history. She does not have any smokeless tobacco history on file. She reports that  drinks alcohol. She reports that she uses illicit drugs (Marijuana, Hydrocodone, and Benzodiazepines).   No Known  Allergies  Family History  Problem Relation Age of Onset  . Hypertension Mother   . Hypertension Brother   . Anxiety disorder Brother   . Depression Brother      Prior to Admission medications   Medication Sig Start Date End Date Taking? Authorizing Provider  ALPRAZolam Prudy Feeler) 1 MG tablet Take 1 mg by mouth 4 (four) times daily.   Yes Historical Provider, MD  buPROPion (WELLBUTRIN XL) 300 MG 24 hr tablet Take 300 mg by mouth every morning.   Yes Historical Provider, MD  busPIRone (BUSPAR) 15 MG tablet Take 15 mg by mouth 3 (three) times daily. 04/01/12  Yes Christiane Ha, MD  cyclobenzaprine (FLEXERIL) 10 MG tablet Take 10 mg by mouth 3 (three) times daily as needed. Muscle spasm 04/01/12  Yes Christiane Ha, MD  DULoxetine (CYMBALTA) 60 MG capsule Take 60 mg by mouth every morning.    Yes Historical Provider, MD  gabapentin (NEURONTIN) 600 MG tablet Take 600 mg by mouth 3 (three) times daily.   Yes Historical Provider, MD  oxybutynin (DITROPAN) 5 MG tablet Take 5 mg by mouth 3 (three) times daily.    Yes Historical Provider, MD  OXYGEN-HELIUM IN Inhale 3 L into the lungs at bedtime.   Yes Historical Provider, MD  risperiDONE (RISPERDAL) 3 MG tablet Take 3 mg by mouth at bedtime.   Yes Historical Provider, MD  traMADol (ULTRAM) 50 MG tablet Take 100 mg by mouth every 6 (  six) hours as needed for pain. pain 04/01/12  Yes Christiane Ha, MD  traZODone (DESYREL) 100 MG tablet Take 300 mg by mouth at bedtime. 04/01/12  Yes Christiane Ha, MD   Physical Exam: Filed Vitals:   12/10/12 2010 12/10/12 2123 12/11/12 0652 12/11/12 0748  BP: 101/55  91/64   Pulse: 86  89   Temp: 97.8 F (36.6 C)  98.2 F (36.8 C)   TempSrc: Oral  Oral   Resp: 20  20   Height: 5\' 3"  (1.6 m)     Weight: 104.6 kg (230 lb 9.6 oz)     SpO2: 93% 96% 92% 92%     General:  Groggy, lethargic, difficulty answering my questions, lights were off in room, patient was rustling through her purse looking for  something and stopped when I entered the room. Complained of back pain.  Eyes: very injected sclera  ENT: dry, poor dentition  Neck: supple  Cardiovascular: RRR  Respiratory: Crackles in bilateral bases, scattered wheezing  Abdomen: soft non-tender  Skin: no rashes  Musculoskeletal: normal, no tenderness on palpation on back   Psychiatric: intoxicated appearing, difficulty concentration and generalzed slowing.  Neurologic: non focal other that difficulty with focusing  Labs on Admission:  Basic Metabolic Panel:  Recent Labs Lab 12/10/12 1614 12/10/12 2033 12/11/12 0610  NA 133*  --  139  K 3.0*  --  4.0  CL 94*  --  105  CO2 30  --  28  GLUCOSE 124*  --  131*  BUN 13  --  11  CREATININE 1.07  --  0.80  CALCIUM 9.0  --  8.3*  MG  --  2.1  --    CBC:  Recent Labs Lab 12/10/12 1614 12/11/12 0610  WBC 14.0* 9.8  NEUTROABS 9.4*  --   HGB 12.6 11.8*  HCT 37.0 36.2  MCV 84.9 86.2  PLT 352 329   Cardiac Enzymes:  Recent Labs Lab 12/10/12 1614  TROPONINI <0.30     Radiological Exams on Admission: Dg Chest 2 View  12/10/2012  *RADIOLOGY REPORT*  Clinical Data: Shortness of breath, hypertension, past history of hypertension, GERD  CHEST - 2 VIEW  Comparison: Portable exam 1657 hours compared to 03/30/2012  Findings: Upper normal heart size. Normal mediastinal contours and pulmonary vascularity. Minimal chronic peribronchial thickening. No pulmonary infiltrate, pleural effusion or pneumothorax. Bones unremarkable.  IMPRESSION: No acute abnormalities.   Original Report Authenticated By: Ulyses Southward, M.D.     EKG: Independently reviewed. Prolonged QT new. Mild.  Assessment/Plan   1. Hypotension, orthostatic, moderate to severe, responded to fluids-suspect multifactorial and related to volume depletion, psychiatric medication, possible adrenal insufficiency since she has COPD and past use of steroids, and potential cor pulmonale with volume depletion.  IV  fluids at 125  Repeat orthostatics in AM  Reduced medication doses  Management of COPD, O2, nebs, inhaled steroids  2. Chronic Back Pain: On large doses on Tramadol, dosage reduced, should not take more than 300mg /day. She sees pain specialist as outpatient.  3. B[facility] Depression[facility], I have reduced dosages of medication to reduce potential side effects.  4. COPD: Supplemental O2, Nebs, will start oral steroids  Code Status: Full Code Family Communication: Discussed plans with patient Disposition Plan: home in AM if medically stable, admitted as observation.   Time spent: 70 minutes  [facility] Pager [phone]  If 7PM-7AM, please contact night-coverage [email or web address] Password [email or web address] 12/11/2012, 10:03 AM

## 2012-12-12 LAB — DRUGS OF ABUSE SCREEN W/O ALC, ROUTINE URINE
Amphetamine Screen, Ur: NEGATIVE
Barbiturate Quant, Ur: NEGATIVE
Benzodiazepines.: POSITIVE — AB
Cocaine Metabolites: NEGATIVE
Creatinine,U: 44.1 mg/dL
Marijuana Metabolite: POSITIVE — AB
Methadone: NEGATIVE
Opiate Screen, Urine: NEGATIVE
Phencyclidine (PCP): NEGATIVE
Propoxyphene: NEGATIVE

## 2012-12-13 LAB — URINE CULTURE: Colony Count: >=100000

## 2012-12-14 ENCOUNTER — Ambulatory Visit (HOSPITAL_COMMUNITY)
Admission: RE | Admit: 2012-12-14 | Discharge: 2012-12-14 | Disposition: A | Discharge status: Home / Self Care | Payer: Medicaid Other | Source: Ambulatory Visit | Attending: *Deleted | Admitting: *Deleted

## 2012-12-14 DIAGNOSIS — Z1231 Encounter for screening mammogram for malignant neoplasm of breast: Secondary | ICD-10-CM | POA: Insufficient documentation

## 2012-12-14 DIAGNOSIS — Z139 Encounter for screening, unspecified: Secondary | ICD-10-CM

## 2012-12-15 LAB — BENZODIAZEPINE, QUANTITATIVE, URINE
Alprazolam (GC/LC/MS), ur confirm: 94 ng/mL — ABNORMAL HIGH
Alprazolam metabolite (GC/LC/MS), ur confirm: 193 ng/mL — ABNORMAL HIGH
Clonazepam metabolite (GC/LC/MS), ur confirm: NEGATIVE ng/mL
Diazepam (GC/LC/MS), ur confirm: NEGATIVE ng/mL
Estazolam (GC/LC/MS), ur confirm: NEGATIVE ng/mL
Flunitrazepam metabolite (GC/LC/MS), ur confirm: NEGATIVE ng/mL
Flurazepam GC/MS Conf: NEGATIVE ng/mL
Lorazepam (GC/LC/MS), ur confirm: NEGATIVE ng/mL
Midazolam (GC/LC/MS), ur confirm: NEGATIVE ng/mL
Nordiazepam GC/MS Conf: NEGATIVE ng/mL
Oxazepam GC/MS Conf: 105 ng/mL — ABNORMAL HIGH
Temazepam GC/MS Conf: NEGATIVE ng/mL
Triazolam metabolite (GC/LC/MS), ur confirm: NEGATIVE ng/mL

## 2012-12-15 LAB — THC (MARIJUANA), URINE, CONFIRMATION: Marijuana, Ur-Confirmation: 136 ng/mL

## 2013-05-23 ENCOUNTER — Emergency Department (HOSPITAL_COMMUNITY)
Admission: EM | Admit: 2013-05-23 | Discharge: 2013-05-23 | Disposition: A | Discharge status: Home / Self Care | Payer: Medicaid Other | Attending: Emergency Medicine | Admitting: Emergency Medicine

## 2013-05-23 ENCOUNTER — Encounter (HOSPITAL_COMMUNITY): Payer: Self-pay | Admitting: *Deleted

## 2013-05-23 DIAGNOSIS — N390 Urinary tract infection, site not specified: Secondary | ICD-10-CM

## 2013-05-23 DIAGNOSIS — G8929 Other chronic pain: Secondary | ICD-10-CM | POA: Insufficient documentation

## 2013-05-23 DIAGNOSIS — F411 Generalized anxiety disorder: Secondary | ICD-10-CM | POA: Insufficient documentation

## 2013-05-23 DIAGNOSIS — Z8719 Personal history of other diseases of the digestive system: Secondary | ICD-10-CM | POA: Insufficient documentation

## 2013-05-23 DIAGNOSIS — Z862 Personal history of diseases of the blood and blood-forming organs and certain disorders involving the immune mechanism: Secondary | ICD-10-CM | POA: Insufficient documentation

## 2013-05-23 DIAGNOSIS — I10 Essential (primary) hypertension: Secondary | ICD-10-CM | POA: Insufficient documentation

## 2013-05-23 DIAGNOSIS — F319 Bipolar disorder, unspecified: Secondary | ICD-10-CM | POA: Insufficient documentation

## 2013-05-23 DIAGNOSIS — F17200 Nicotine dependence, unspecified, uncomplicated: Secondary | ICD-10-CM | POA: Insufficient documentation

## 2013-05-23 DIAGNOSIS — Z79899 Other long term (current) drug therapy: Secondary | ICD-10-CM | POA: Insufficient documentation

## 2013-05-23 LAB — BASIC METABOLIC PANEL
BUN: 11 mg/dL (ref 6–23)
CO2: 32 mEq/L (ref 19–32)
Calcium: 9.4 mg/dL (ref 8.4–10.5)
Chloride: 102 mEq/L (ref 96–112)
Creatinine, Ser: 0.9 mg/dL (ref 0.50–1.10)
GFR calc Af Amer: 84 mL/min — ABNORMAL LOW (ref >90)
GFR calc non Af Amer: 72 mL/min — ABNORMAL LOW (ref >90)
Glucose, Bld: 102 mg/dL — ABNORMAL HIGH (ref 70–99)
Potassium: 4 mEq/L (ref 3.5–5.1)
Sodium: 139 mEq/L (ref 135–145)

## 2013-05-23 LAB — URINE MICROSCOPIC-ADD ON

## 2013-05-23 LAB — URINALYSIS, ROUTINE W REFLEX MICROSCOPIC
Bilirubin Urine: NEGATIVE
Glucose, UA: NEGATIVE mg/dL
Hgb urine dipstick: NEGATIVE
Ketones, ur: NEGATIVE mg/dL
Nitrite: POSITIVE — AB
Protein, ur: NEGATIVE mg/dL
Specific Gravity, Urine: >1.03 — ABNORMAL HIGH (ref 1.005–1.030)
Urobilinogen, UA: 0.2 mg/dL (ref 0.0–1.0)
pH: 5.5 (ref 5.0–8.0)

## 2013-05-23 LAB — CBC WITH DIFFERENTIAL/PLATELET
Basophils Absolute: 0 10*3/uL (ref 0.0–0.1)
Basophils Relative: 0 % (ref 0–1)
Eosinophils Absolute: 0.2 10*3/uL (ref 0.0–0.7)
Eosinophils Relative: 2 % (ref 0–5)
HCT: 42.5 % (ref 36.0–46.0)
Hemoglobin: 14.1 g/dL (ref 12.0–15.0)
Lymphocytes Relative: 29 % (ref 12–46)
Lymphs Abs: 3 10*3/uL (ref 0.7–4.0)
MCH: 29.4 pg (ref 26.0–34.0)
MCHC: 33.2 g/dL (ref 30.0–36.0)
MCV: 88.5 fL (ref 78.0–100.0)
Monocytes Absolute: 0.5 10*3/uL (ref 0.1–1.0)
Monocytes Relative: 5 % (ref 3–12)
Neutro Abs: 6.5 10*3/uL (ref 1.7–7.7)
Neutrophils Relative %: 64 % (ref 43–77)
Platelets: 275 10*3/uL (ref 150–400)
RBC: 4.8 MIL/uL (ref 3.87–5.11)
RDW: 15.5 % (ref 11.5–15.5)
WBC: 10.1 10*3/uL (ref 4.0–10.5)

## 2013-05-23 MED ORDER — SULFAMETHOXAZOLE-TRIMETHOPRIM 800-160 MG PO TABS: 1.0000 | ORAL_TABLET | Freq: Two times a day (BID) | ORAL | Status: DC

## 2013-05-23 MED ORDER — SULFAMETHOXAZOLE-TMP DS 800-160 MG PO TABS
1.0000 | ORAL_TABLET | Freq: Once | ORAL | Status: AC
  Administered 2013-05-23: 1 via ORAL
  Filled 2013-05-23: qty 1

## 2013-05-23 MED ORDER — PHENAZOPYRIDINE HCL 100 MG PO TABS
200.0000 mg | ORAL_TABLET | Freq: Once | ORAL | Status: AC
  Administered 2013-05-23: 200 mg via ORAL
  Filled 2013-05-23: qty 2

## 2013-05-23 MED ORDER — KETOROLAC TROMETHAMINE 60 MG/2ML IM SOLN
60.0000 mg | Freq: Once | INTRAMUSCULAR | Status: AC
  Administered 2013-05-23: 60 mg via INTRAMUSCULAR
  Filled 2013-05-23: qty 2

## 2013-05-23 MED ORDER — PHENAZOPYRIDINE HCL 200 MG PO TABS: 200.0000 mg | ORAL_TABLET | Freq: Three times a day (TID) | ORAL | Status: DC

## 2013-05-23 NOTE — ED Provider Notes (Signed)
CSN: [id number]     Arrival date & time 05/23/13  1100 History   First MD Initiated Contact with Patient 05/23/13 1115     Chief Complaint  Patient presents with  . Back Pain   (Consider location/radiation/quality/duration/timing/severity/associated sxs/prior Treatment) Patient is a 51 y.o. female presenting with back pain. The history is provided by the patient.  Back Pain Location:  Thoracic spine and lumbar spine Quality:  Aching Pain severity:  Moderate Onset quality:  Gradual Duration:  4 days Timing:  Constant Progression:  Worsening Chronicity:  New Relieved by:  Nothing Worsened by:  Nothing tried Ineffective treatments:  Narcotics Associated symptoms: dysuria   Associated symptoms: no abdominal pain, no bladder incontinence, no bowel incontinence, no chest pain, no fever, no headaches and no leg pain    Tiffany Lynch is a 52 y.o. female who presents to the ED with back pain. She states that she has chronic back pain but this is different. She feel like this pain is kidney pain. Patient has taken Vicodin and Tramadol without relief.   Past Medical History  Diagnosis Date  . Chronic back pain   . Bipolar 1 disorder   . Anxiety   . Depression   . Hypertension   . Sleep apnea   . On home oxygen therapy     3LPM PRN  . History of anemia   . GERD (gastroesophageal reflux disease)    Past Surgical History  Procedure Laterality Date  . Dilation and curettage of uterus  age 68  . Polypectomy  05/22/2012    Procedure: POLYPECTOMY;  Surgeon: Tilda Burrow, MD;  Location: [facility];  Service: Gynecology;  Laterality: N/A;  Endometrial Polypectomy  . Tubal ligation     Family History  Problem Relation Age of Onset  . Hypertension Mother   . Hypertension Brother   . Anxiety disorder Brother   . Depression Brother    History  Substance Use Topics  . Smoking status: Current Every Day Smoker -- 0.50 packs/day for 35 years    Types: Cigarettes  . Smokeless tobacco: Not  on file  . Alcohol Use: Yes     Comment: occasionally   OB History   Grav Para Term Preterm Abortions TAB SAB Ect Mult Living   2 2 2       2      Review of Systems  Constitutional: Negative for fever and chills.  HENT: Negative for ear pain and neck pain.   Eyes: Negative for visual disturbance.  Respiratory: Negative for wheezing.   Cardiovascular: Negative for chest pain.  Gastrointestinal: Negative for nausea, vomiting, abdominal pain and bowel incontinence.  Genitourinary: Positive for dysuria, urgency and frequency. Negative for bladder incontinence, vaginal bleeding and vaginal discharge.  Musculoskeletal: Positive for back pain.  Skin: Negative for rash.  Neurological: Negative for light-headedness and headaches.  Psychiatric/Behavioral: The patient is not nervous/anxious.     Allergies  Review of patient's allergies indicates no known allergies.  Home Medications   Current Outpatient Rx  Name  Route  Sig  Dispense  Refill  . ALPRAZolam (XANAX) 1 MG tablet   Oral   Take 1 tablet (1 mg total) by mouth 3 (three) times daily.         Marland Kitchen buPROPion (WELLBUTRIN XL) 300 MG 24 hr tablet   Oral   Take 300 mg by mouth every morning.         . busPIRone (BUSPAR) 15 MG tablet   Oral  Take 15 mg by mouth 3 (three) times daily.         . DULoxetine (CYMBALTA) 60 MG capsule   Oral   Take 60 mg by mouth every morning.          . gabapentin (NEURONTIN) 600 MG tablet   Oral   Take 600 mg by mouth 3 (three) times daily.         Marland Kitchen oxybutynin (DITROPAN) 5 MG tablet   Oral   Take 5 mg by mouth 3 (three) times daily.          . OXYGEN-HELIUM IN   Inhalation   Inhale 3 L into the lungs at bedtime.         . phenazopyridine (PYRIDIUM) 200 MG tablet   Oral   Take 1 tablet (200 mg total) by mouth 3 (three) times daily.   6 tablet   0   . risperiDONE (RISPERDAL) 2 MG tablet   Oral   Take 1 tablet (2 mg total) by mouth at bedtime.         .  sulfamethoxazole-trimethoprim (SEPTRA DS) 800-160 MG per tablet   Oral   Take 1 tablet by mouth every 12 (twelve) hours.   14 tablet   0   . traMADol (ULTRAM) 50 MG tablet   Oral   Take 1 tablet (50 mg total) by mouth every 6 (six) hours as needed for pain. pain         . traZODone (DESYREL) 100 MG tablet   Oral   Take 2 tablets (200 mg total) by mouth at bedtime.          BP 121/75  Pulse 75  Temp(Src) 98.4 F (36.9 C) (Oral)  Resp 16  Ht 5\' 3"  (1.6 m)  Wt 214 lb (97.07 kg)  BMI 37.92 kg/m2  SpO2 94%  LMP 05/06/2012 Physical Exam  Nursing note and vitals reviewed. Constitutional: She is oriented to person, place, and time. She appears well-developed and well-nourished. No distress.  HENT:  Head: Normocephalic and atraumatic.  Eyes: EOM are normal.  Neck: Normal range of motion. Neck supple.  Cardiovascular: Normal rate, regular rhythm and normal heart sounds.   Pulmonary/Chest: Effort normal. She has wheezes.  Abdominal: Soft. Bowel sounds are normal. There is no tenderness. There is CVA tenderness (right).  Musculoskeletal:       Back:  Right flank pain  Neurological: She is alert and oriented to person, place, and time. No cranial nerve deficit.  Skin: Skin is warm and dry.  Psychiatric: She has a normal mood and affect. Her behavior is normal.   Results for orders placed during the hospital encounter of 05/23/13 (from the past 24 hour(s))  URINALYSIS, ROUTINE W REFLEX MICROSCOPIC     Status: Abnormal   Collection Time    05/23/13 11:21 AM      Result Value Range   Color, Urine YELLOW  YELLOW   APPearance CLOUDY (*) CLEAR   Specific Gravity, Urine >1.030 (*) 1.005 - 1.030   pH 5.5  5.0 - 8.0   Glucose, UA NEGATIVE  NEGATIVE mg/dL   Hgb urine dipstick NEGATIVE  NEGATIVE   Bilirubin Urine NEGATIVE  NEGATIVE   Ketones, ur NEGATIVE  NEGATIVE mg/dL   Protein, ur NEGATIVE  NEGATIVE mg/dL   Urobilinogen, UA 0.2  0.0 - 1.0 mg/dL   Nitrite POSITIVE (*) NEGATIVE    Leukocytes, UA TRACE (*) NEGATIVE  URINE MICROSCOPIC-ADD ON     Status: Abnormal  Collection Time    05/23/13 11:21 AM      Result Value Range   Squamous Epithelial / LPF MANY (*) RARE   WBC, UA 11-20  <3 WBC/hpf   RBC / HPF 3-6  <3 RBC/hpf   Bacteria, UA MANY (*) RARE  CBC WITH DIFFERENTIAL     Status: None   Collection Time    05/23/13 11:51 AM      Result Value Range   WBC 10.1  4.0 - 10.5 K/uL   RBC 4.80  3.87 - 5.11 MIL/uL   Hemoglobin 14.1  12.0 - 15.0 g/dL   HCT 21.3  08.6 - 57.8 %   MCV 88.5  78.0 - 100.0 fL   MCH 29.4  26.0 - 34.0 pg   MCHC 33.2  30.0 - 36.0 g/dL   RDW 46.9  62.9 - 52.8 %   Platelets 275  150 - 400 K/uL   Neutrophils Relative % 64  43 - 77 %   Neutro Abs 6.5  1.7 - 7.7 K/uL   Lymphocytes Relative 29  12 - 46 %   Lymphs Abs 3.0  0.7 - 4.0 K/uL   Monocytes Relative 5  3 - 12 %   Monocytes Absolute 0.5  0.1 - 1.0 K/uL   Eosinophils Relative 2  0 - 5 %   Eosinophils Absolute 0.2  0.0 - 0.7 K/uL   Basophils Relative 0  0 - 1 %   Basophils Absolute 0.0  0.0 - 0.1 K/uL  BASIC METABOLIC PANEL     Status: Abnormal   Collection Time    05/23/13 11:51 AM      Result Value Range   Sodium 139  135 - 145 mEq/L   Potassium 4.0  3.5 - 5.1 mEq/L   Chloride 102  96 - 112 mEq/L   CO2 32  19 - 32 mEq/L   Glucose, Bld 102 (*) 70 - 99 mg/dL   BUN 11  6 - 23 mg/dL   Creatinine, Ser 4.13  0.50 - 1.10 mg/dL   Calcium 9.4  8.4 - 24.4 mg/dL   GFR calc non Af Amer 72 (*) >90 mL/min   GFR calc Af Amer 84 (*) >90 mL/min    ED Course  Procedures  MDM  52 y.o. female with UTI. Will treat with antibiotics and urinary analgesic. She is to follow up with her PCP. She will return here for worsening symptoms. Patient stable for discharge without any immediate complications.  I have reviewed this patient's vital signs, nurses notes, appropriate labs and discussed findings with the patient and plan of care.     Medication List    TAKE these medications        phenazopyridine 200 MG tablet  Commonly known as:  PYRIDIUM  Take 1 tablet (200 mg total) by mouth 3 (three) times daily.     sulfamethoxazole-trimethoprim 800-160 MG per tablet  Commonly known as:  SEPTRA DS  Take 1 tablet by mouth every 12 (twelve) hours.      ASK your doctor about these medications       ALPRAZolam 1 MG tablet  Commonly known as:  XANAX  Take 1 tablet (1 mg total) by mouth 3 (three) times daily.     buPROPion 300 MG 24 hr tablet  Commonly known as:  WELLBUTRIN XL  Take 300 mg by mouth every morning.     busPIRone 15 MG tablet  Commonly known as:  BUSPAR  Take 15 mg  by mouth 3 (three) times daily.     DULoxetine 60 MG capsule  Commonly known as:  CYMBALTA  Take 60 mg by mouth every morning.     gabapentin 600 MG tablet  Commonly known as:  NEURONTIN  Take 600 mg by mouth 3 (three) times daily.     oxybutynin 5 MG tablet  Commonly known as:  DITROPAN  Take 5 mg by mouth 3 (three) times daily.     OXYGEN-HELIUM IN  Inhale 3 L into the lungs at bedtime.     risperiDONE 2 MG tablet  Commonly known as:  RISPERDAL  Take 1 tablet (2 mg total) by mouth at bedtime.     traMADol 50 MG tablet  Commonly known as:  ULTRAM  Take 1 tablet (50 mg total) by mouth every 6 (six) hours as needed for pain. pain     traZODone 100 MG tablet  Commonly known as:  DESYREL  Take 2 tablets (200 mg total) by mouth at bedtime.           [facility] Orlene Och, [place] 05/23/13 [phone]

## 2013-05-23 NOTE — ED Notes (Signed)
Pt states right mid-lower back pain since Wednesday with urinary frequency. Hx of chronic back pain but states this is different stating, "It feels more like my kidney"

## 2013-05-23 NOTE — ED Notes (Signed)
Pt with right flank pain for few days per pt, denies N/v, denies hx of kidney stones

## 2013-05-25 LAB — URINE CULTURE: Colony Count: >=100000

## 2013-05-27 NOTE — ED Provider Notes (Signed)
Medical screening examination/treatment/procedure(s) were performed by non-physician practitioner and as supervising physician I was immediately available for consultation/collaboration.  Sharna Gabrys, MD 05/27/13 1023 

## 2013-07-01 ENCOUNTER — Other Ambulatory Visit (HOSPITAL_COMMUNITY): Payer: Self-pay | Admitting: Urology

## 2013-07-01 DIAGNOSIS — R32 Unspecified urinary incontinence: Secondary | ICD-10-CM

## 2013-07-01 DIAGNOSIS — N390 Urinary tract infection, site not specified: Secondary | ICD-10-CM

## 2013-07-07 ENCOUNTER — Ambulatory Visit (HOSPITAL_COMMUNITY)
Admission: RE | Admit: 2013-07-07 | Discharge: 2013-07-07 | Disposition: A | Discharge status: Home / Self Care | Payer: Medicaid Other | Source: Ambulatory Visit | Attending: Urology | Admitting: Urology

## 2013-07-07 DIAGNOSIS — R32 Unspecified urinary incontinence: Secondary | ICD-10-CM

## 2013-07-07 DIAGNOSIS — N390 Urinary tract infection, site not specified: Secondary | ICD-10-CM | POA: Insufficient documentation

## 2013-12-21 ENCOUNTER — Encounter (HOSPITAL_COMMUNITY): Payer: Self-pay | Admitting: Emergency Medicine

## 2013-12-21 ENCOUNTER — Emergency Department (HOSPITAL_COMMUNITY): Payer: Medicaid Other

## 2013-12-21 ENCOUNTER — Inpatient Hospital Stay (HOSPITAL_COMMUNITY)
Admission: EM | Admit: 2013-12-21 | Discharge: 2013-12-23 | DRG: 684 | Disposition: A | Discharge status: Home / Self Care | Payer: Medicaid Other | Attending: Internal Medicine | Admitting: Internal Medicine

## 2013-12-21 DIAGNOSIS — Z9981 Dependence on supplemental oxygen: Secondary | ICD-10-CM

## 2013-12-21 DIAGNOSIS — F17200 Nicotine dependence, unspecified, uncomplicated: Secondary | ICD-10-CM | POA: Diagnosis present

## 2013-12-21 DIAGNOSIS — R0789 Other chest pain: Secondary | ICD-10-CM

## 2013-12-21 DIAGNOSIS — I10 Essential (primary) hypertension: Secondary | ICD-10-CM

## 2013-12-21 DIAGNOSIS — K219 Gastro-esophageal reflux disease without esophagitis: Secondary | ICD-10-CM | POA: Diagnosis present

## 2013-12-21 DIAGNOSIS — E860 Dehydration: Secondary | ICD-10-CM

## 2013-12-21 DIAGNOSIS — N179 Acute kidney failure, unspecified: Principal | ICD-10-CM

## 2013-12-21 DIAGNOSIS — T502X5A Adverse effect of carbonic-anhydrase inhibitors, benzothiadiazides and other diuretics, initial encounter: Secondary | ICD-10-CM | POA: Diagnosis present

## 2013-12-21 DIAGNOSIS — Z79899 Other long term (current) drug therapy: Secondary | ICD-10-CM

## 2013-12-21 DIAGNOSIS — E861 Hypovolemia: Secondary | ICD-10-CM | POA: Diagnosis present

## 2013-12-21 DIAGNOSIS — F411 Generalized anxiety disorder: Secondary | ICD-10-CM | POA: Diagnosis present

## 2013-12-21 DIAGNOSIS — F419 Anxiety disorder, unspecified: Secondary | ICD-10-CM

## 2013-12-21 DIAGNOSIS — I959 Hypotension, unspecified: Secondary | ICD-10-CM

## 2013-12-21 DIAGNOSIS — G4730 Sleep apnea, unspecified: Secondary | ICD-10-CM | POA: Diagnosis present

## 2013-12-21 DIAGNOSIS — Z8249 Family history of ischemic heart disease and other diseases of the circulatory system: Secondary | ICD-10-CM

## 2013-12-21 DIAGNOSIS — F319 Bipolar disorder, unspecified: Secondary | ICD-10-CM

## 2013-12-21 DIAGNOSIS — Z6839 Body mass index (BMI) 39.0-39.9, adult: Secondary | ICD-10-CM

## 2013-12-21 DIAGNOSIS — E669 Obesity, unspecified: Secondary | ICD-10-CM | POA: Diagnosis present

## 2013-12-21 DIAGNOSIS — G8929 Other chronic pain: Secondary | ICD-10-CM | POA: Diagnosis present

## 2013-12-21 LAB — COMPREHENSIVE METABOLIC PANEL
ALT: 23 U/L (ref 0–35)
AST: 14 U/L (ref 0–37)
Albumin: 3.8 g/dL (ref 3.5–5.2)
Alkaline Phosphatase: 108 U/L (ref 39–117)
BUN: 30 mg/dL — ABNORMAL HIGH (ref 6–23)
CO2: 28 mEq/L (ref 19–32)
Calcium: 10 mg/dL (ref 8.4–10.5)
Chloride: 94 mEq/L — ABNORMAL LOW (ref 96–112)
Creatinine, Ser: 2.83 mg/dL — ABNORMAL HIGH (ref 0.50–1.10)
GFR calc Af Amer: 21 mL/min — ABNORMAL LOW (ref >90)
GFR calc non Af Amer: 18 mL/min — ABNORMAL LOW (ref >90)
Glucose, Bld: 87 mg/dL (ref 70–99)
Potassium: 4.1 mEq/L (ref 3.7–5.3)
Sodium: 138 mEq/L (ref 137–147)
Total Bilirubin: <0.2 mg/dL — ABNORMAL LOW (ref 0.3–1.2)
Total Protein: 7.6 g/dL (ref 6.0–8.3)

## 2013-12-21 LAB — URINALYSIS, ROUTINE W REFLEX MICROSCOPIC
Bilirubin Urine: NEGATIVE
Glucose, UA: NEGATIVE mg/dL
Hgb urine dipstick: NEGATIVE
Ketones, ur: NEGATIVE mg/dL
Leukocytes, UA: NEGATIVE
Nitrite: NEGATIVE
Protein, ur: NEGATIVE mg/dL
Specific Gravity, Urine: 1.015 (ref 1.005–1.030)
Urobilinogen, UA: 0.2 mg/dL (ref 0.0–1.0)
pH: 6 (ref 5.0–8.0)

## 2013-12-21 LAB — CBC WITH DIFFERENTIAL/PLATELET
Basophils Absolute: 0 10*3/uL (ref 0.0–0.1)
Basophils Relative: 0 % (ref 0–1)
Eosinophils Absolute: 0 10*3/uL (ref 0.0–0.7)
Eosinophils Relative: 0 % (ref 0–5)
HCT: 44 % (ref 36.0–46.0)
Hemoglobin: 15 g/dL (ref 12.0–15.0)
Lymphocytes Relative: 26 % (ref 12–46)
Lymphs Abs: 3.5 10*3/uL (ref 0.7–4.0)
MCH: 29.9 pg (ref 26.0–34.0)
MCHC: 34.1 g/dL (ref 30.0–36.0)
MCV: 87.8 fL (ref 78.0–100.0)
Monocytes Absolute: 0.8 10*3/uL (ref 0.1–1.0)
Monocytes Relative: 6 % (ref 3–12)
Neutro Abs: 9.1 10*3/uL — ABNORMAL HIGH (ref 1.7–7.7)
Neutrophils Relative %: 68 % (ref 43–77)
Platelets: 369 10*3/uL (ref 150–400)
RBC: 5.01 MIL/uL (ref 3.87–5.11)
RDW: 14.2 % (ref 11.5–15.5)
WBC: 13.4 10*3/uL — ABNORMAL HIGH (ref 4.0–10.5)

## 2013-12-21 LAB — CK: Total CK: 49 U/L (ref 7–177)

## 2013-12-21 LAB — TROPONIN I: Troponin I: <0.3 ng/mL (ref <0.30)

## 2013-12-21 MED ORDER — ONDANSETRON HCL 4 MG PO TABS
4.0000 mg | ORAL_TABLET | Freq: Four times a day (QID) | ORAL | Status: DC | PRN
  Administered 2013-12-22 – 2013-12-23 (×4): 4 mg via ORAL
  Filled 2013-12-21 (×4): qty 1

## 2013-12-21 MED ORDER — NICOTINE 21 MG/24HR TD PT24
21.0000 mg | MEDICATED_PATCH | Freq: Once | TRANSDERMAL | Status: AC
  Administered 2013-12-21 – 2013-12-22 (×2): 21 mg via TRANSDERMAL
  Filled 2013-12-21 (×2): qty 1

## 2013-12-21 MED ORDER — HYDROCODONE-ACETAMINOPHEN 5-325 MG PO TABS
1.0000 | ORAL_TABLET | ORAL | Status: DC | PRN
  Administered 2013-12-22 – 2013-12-23 (×7): 1 via ORAL
  Filled 2013-12-21 (×7): qty 1

## 2013-12-21 MED ORDER — BUPROPION HCL ER (XL) 150 MG PO TB24
ORAL_TABLET | ORAL | Status: AC
  Filled 2013-12-21: qty 2

## 2013-12-21 MED ORDER — ONDANSETRON HCL 4 MG/2ML IJ SOLN
4.0000 mg | Freq: Four times a day (QID) | INTRAMUSCULAR | Status: DC | PRN
  Filled 2013-12-21: qty 2

## 2013-12-21 MED ORDER — HEPARIN SODIUM (PORCINE) 5000 UNIT/ML IJ SOLN
5000.0000 [IU] | Freq: Three times a day (TID) | INTRAMUSCULAR | Status: DC
  Administered 2013-12-21 – 2013-12-23 (×5): 5000 [IU] via SUBCUTANEOUS
  Filled 2013-12-21 (×6): qty 1

## 2013-12-21 MED ORDER — BUSPIRONE HCL 5 MG PO TABS
15.0000 mg | ORAL_TABLET | Freq: Three times a day (TID) | ORAL | Status: DC
  Administered 2013-12-21 – 2013-12-23 (×6): 15 mg via ORAL
  Filled 2013-12-21 (×6): qty 3

## 2013-12-21 MED ORDER — SODIUM CHLORIDE 0.9 % IV SOLN: INTRAVENOUS | Status: DC

## 2013-12-21 MED ORDER — ALPRAZOLAM 1 MG PO TABS
1.0000 mg | ORAL_TABLET | Freq: Four times a day (QID) | ORAL | Status: DC
  Administered 2013-12-21 – 2013-12-23 (×7): 1 mg via ORAL
  Filled 2013-12-21 (×7): qty 1

## 2013-12-21 MED ORDER — BUPROPION HCL ER (XL) 300 MG PO TB24
300.0000 mg | ORAL_TABLET | Freq: Every morning | ORAL | Status: DC
  Administered 2013-12-22 – 2013-12-23 (×2): 300 mg via ORAL
  Filled 2013-12-21 (×2): qty 1
  Filled 2013-12-21: qty 2

## 2013-12-21 MED ORDER — RISPERIDONE 1 MG PO TABS
2.0000 mg | ORAL_TABLET | Freq: Every day | ORAL | Status: DC
  Administered 2013-12-21 – 2013-12-22 (×2): 2 mg via ORAL
  Filled 2013-12-21 (×2): qty 2

## 2013-12-21 MED ORDER — GABAPENTIN 300 MG PO CAPS
600.0000 mg | ORAL_CAPSULE | Freq: Three times a day (TID) | ORAL | Status: DC
  Administered 2013-12-21 – 2013-12-23 (×6): 600 mg via ORAL
  Filled 2013-12-21 (×6): qty 2

## 2013-12-21 MED ORDER — TRAZODONE HCL 50 MG PO TABS
300.0000 mg | ORAL_TABLET | Freq: Every day | ORAL | Status: DC
  Administered 2013-12-21 – 2013-12-22 (×2): 300 mg via ORAL
  Filled 2013-12-21 (×2): qty 6

## 2013-12-21 MED ORDER — SODIUM CHLORIDE 0.9 % IV SOLN
INTRAVENOUS | Status: DC
  Administered 2013-12-21 – 2013-12-23 (×3): via INTRAVENOUS

## 2013-12-21 MED ORDER — CHOLECALCIFEROL 10 MCG (400 UNIT) PO TABS
400.0000 [IU] | ORAL_TABLET | Freq: Every morning | ORAL | Status: DC
  Administered 2013-12-22 – 2013-12-23 (×2): 400 [IU] via ORAL
  Filled 2013-12-21 (×2): qty 1

## 2013-12-21 MED ORDER — SODIUM CHLORIDE 0.9 % IV BOLUS (SEPSIS)
2000.0000 mL | Freq: Once | INTRAVENOUS | Status: AC
  Administered 2013-12-21: 1000 mL via INTRAVENOUS

## 2013-12-21 MED ORDER — AMLODIPINE BESYLATE 5 MG PO TABS: 2.5000 mg | ORAL_TABLET | Freq: Every day | ORAL | Status: DC

## 2013-12-21 MED ORDER — BACITRACIN-NEOMYCIN-POLYMYXIN 400-5-5000 EX OINT
TOPICAL_OINTMENT | Freq: Once | CUTANEOUS | Status: AC
  Administered 2013-12-21: 2 via TOPICAL
  Filled 2013-12-21: qty 2

## 2013-12-21 MED ORDER — DULOXETINE HCL 60 MG PO CPEP
60.0000 mg | ORAL_CAPSULE | Freq: Every day | ORAL | Status: DC
  Administered 2013-12-22 – 2013-12-23 (×2): 60 mg via ORAL
  Filled 2013-12-21: qty 1

## 2013-12-21 MED ORDER — FENTANYL CITRATE 0.05 MG/ML IJ SOLN
100.0000 ug | Freq: Once | INTRAMUSCULAR | Status: AC
  Administered 2013-12-21: 100 ug via INTRAVENOUS
  Filled 2013-12-21: qty 2

## 2013-12-21 NOTE — H&P (Signed)
[facility] History and Physical  Tiffany Lynch:[id number] DOB: 1960-11-24 DOA: 12/21/2013  Referring physician: ER PCP: No PCP Per Patient   Chief Complaint: Dizziness  HPI: Tiffany Lynch is a 53 y.o. female  This 53 year old lady presents to the emergency room with complaints of dizziness. She had a fall yesterday because of dizziness and hurt her chest which resulted in musculoskeletal chest pain. She also describes neck pain from the fall. There is no dyspnea. When she was evaluated in the emergency room, she was found to be hypotensive and in acute renal failure. She is now being admitted for further management.   Review of Systems:  Constitutional:  No weight loss, night sweats, Fevers, chills, fatigue.  HEENT:  No headaches, Difficulty swallowing,Tooth/dental problems,Sore throat,  No sneezing, itching, ear ache, nasal congestion, post nasal drip,  Cardio-vascular:  No  Orthopnea, PND, swelling in lower extremities, anasarca, , palpitations  GI:  No heartburn, indigestion, abdominal pain, nausea, vomiting, diarrhea, change in bowel habits, loss of appetite  Resp:  No shortness of breath with exertion or at rest. No excess mucus, no productive cough, No non-productive cough, No coughing up of blood.No change in color of mucus.No wheezing.No chest wall deformity  Skin:  no rash or lesions.  GU:  no dysuria, change in color of urine, no urgency or frequency. No flank pain.  Musculoskeletal:  No joint pain or swelling. No decreased range of motion. No back pain.  Psych:  No change in mood or affect. No depression or anxiety. No memory loss.   Past Medical History  Diagnosis Date  . Chronic back pain   . Bipolar 1 disorder   . Anxiety   . Depression   . Hypertension   . Sleep apnea   . On home oxygen therapy     3LPM PRN  . History of anemia   . GERD (gastroesophageal reflux disease)    Past Surgical History  Procedure Laterality Date  . Dilation and  curettage of uterus  age 78  . Polypectomy  05/22/2012    Procedure: POLYPECTOMY;  Surgeon: Jonnie Kind, MD;  Location: [facility];  Service: Gynecology;  Laterality: N/A;  Endometrial Polypectomy  . Tubal ligation     Social History:  reports that she has been smoking Cigarettes.  She has a 17.5 pack-year smoking history. She has never used smokeless tobacco. She reports that she drinks alcohol. She reports that she uses illicit drugs (Marijuana, Hydrocodone, and Benzodiazepines).  No Known Allergies  Family History  Problem Relation Age of Onset  . Hypertension Mother   . Hypertension Brother   . Anxiety disorder Brother   . Depression Brother      Prior to Admission medications   Medication Sig Start Date End Date Taking? Authorizing Provider  ALPRAZolam Duanne Moron) 1 MG tablet Take 1 mg by mouth 4 (four) times daily.   Yes Historical Provider, MD  amLODipine (NORVASC) 2.5 MG tablet Take 2.5 mg by mouth daily.   Yes Historical Provider, MD  buPROPion (WELLBUTRIN XL) 300 MG 24 hr tablet Take 300 mg by mouth every morning.   Yes Historical Provider, MD  busPIRone (BUSPAR) 15 MG tablet Take 15 mg by mouth 3 (three) times daily. 04/01/12  Yes Delfina Redwood, MD  cephALEXin (KEFLEX) 500 MG capsule Take 500 mg by mouth 3 (three) times daily. 7 day course starting on 12/17/2013   Yes Historical Provider, MD  cholecalciferol (VITAMIN D) 400 UNITS TABS tablet Take 400  Units by mouth every morning.   Yes Historical Provider, MD  DULoxetine (CYMBALTA) 60 MG capsule Take 60 mg by mouth every morning.    Yes Historical Provider, MD  gabapentin (NEURONTIN) 600 MG tablet Take 600 mg by mouth 3 (three) times daily.   Yes Historical Provider, MD  HYDROcodone-acetaminophen (NORCO/VICODIN) 5-325 MG per tablet Take 1 tablet by mouth every 4 (four) hours as needed for moderate pain (started on 12/17/2013 #15).   Yes Historical Provider, MD  lisinopril-hydrochlorothiazide (PRINZIDE,ZESTORETIC) 10-12.5 MG per  tablet Take 1 tablet by mouth daily.   Yes Historical Provider, MD  Multiple Vitamin (MULTIVITAMIN WITH MINERALS) TABS tablet Take 1 tablet by mouth every morning.   Yes Historical Provider, MD  risperiDONE (RISPERDAL) 2 MG tablet Take 1 tablet (2 mg total) by mouth at bedtime. 12/11/12  Yes Samuella Cota, MD  traMADol (ULTRAM) 50 MG tablet Take 50-100 mg by mouth every 6 (six) hours as needed for moderate pain.   Yes Historical Provider, MD  traZODone (DESYREL) 100 MG tablet Take 300 mg by mouth at bedtime.   Yes Historical Provider, MD  oxyCODONE-acetaminophen (PERCOCET/ROXICET) 5-325 MG per tablet Take 1 tablet by mouth every 4 (four) hours as needed for severe pain (12/10/2013 #30).    Historical Provider, MD   Physical Exam: Filed Vitals:   12/21/13 2110  BP: 95/67  Pulse: 76  Temp: 97.9 F (36.6 C)  Resp: 18    BP 95/67  Pulse 76  Temp(Src) 97.9 F (36.6 C) (Oral)  Resp 18  Ht 5\' 3"  (1.6 m)  Wt 100.517 kg (221 lb 9.6 oz)  BMI 39.26 kg/m2  SpO2 94%  LMP 05/06/2012  General:  Appears calm and comfortable. She is hemodynamically stable despite soft blood pressure. She is somewhat dehydrated. She is obese. Eyes: PERRL, normal lids, irises & conjunctiva ENT: grossly normal hearing, lips & tongue Neck: no LAD, masses or thyromegaly Cardiovascular: RRR, no m/r/g. No LE edema. Telemetry: SR, no arrhythmias  Respiratory: CTA bilaterally, no w/r/r. Normal respiratory effort. Abdomen: soft, ntnd Skin: no rash or induration seen on limited exam Musculoskeletal: grossly normal tone BUE/BLE. Anterior chest wall tenderness reproducing her chest pain. Psychiatric: grossly normal mood and affect, speech fluent and appropriate Neurologic: grossly non-focal.          Labs on Admission:  Basic Metabolic Panel:  Recent Labs Lab 12/21/13 1646  NA 138  K 4.1  CL 94*  CO2 28  GLUCOSE 87  BUN 30*  CREATININE 2.83*  CALCIUM 10.0   Liver Function Tests:  Recent Labs Lab  12/21/13 1646  AST 14  ALT 23  ALKPHOS 108  BILITOT <0.2*  PROT 7.6  ALBUMIN 3.8     CBC:  Recent Labs Lab 12/21/13 1646  WBC 13.4*  NEUTROABS 9.1*  HGB 15.0  HCT 44.0  MCV 87.8  PLT 369   Cardiac Enzymes:  Recent Labs Lab 12/21/13 1641 12/21/13 1646  CKTOTAL 49  --   TROPONINI  --  <0.30      Radiological Exams on Admission: Dg Chest 2 View  12/21/2013   CLINICAL DATA:  DIZZINESS FALL  EXAM: CHEST  2 VIEW  COMPARISON:  DG CHEST 2 VIEW dated 12/10/2012  FINDINGS: The heart size and mediastinal contours are within normal limits. Both lungs are clear. The visualized skeletal structures are unremarkable.  IMPRESSION: No active cardiopulmonary disease.   Electronically Signed   By: Margaree Mackintosh M.D.   On: 12/21/2013 16:50  Ct Cervical Spine Wo Contrast  12/21/2013   CLINICAL DATA:  Fall yesterday.  Neck pain and tenderness.  EXAM: CT CERVICAL SPINE WITHOUT CONTRAST  TECHNIQUE: Multidetector CT imaging of the cervical spine was performed without intravenous contrast. Multiplanar CT image reconstructions were also generated.  COMPARISON:  DG CERVICAL SPINE COMPLETE dated 11/30/2011  FINDINGS: There is loss of the normal cervical lordosis with 1.5 mm of degenerative anterolisthesis at C4-5.  No prevertebral soft tissue swelling or cervical spine fracture is observed. Posterior osseous ridging and uncinate spurring cause considerable left foraminal stenosis at the C6-7 level, and facet spurring causes mild osseous foraminal stenosis on the left at C4-5. This spurring appears chronic.  Chronic left maxillary sinusitis is observed.  IMPRESSION: 1. No cervical spine fracture or acute subluxation is identified. 2. Spurring contributes to prominent left foraminal stenosis at C6-7 and mild bony left foraminal stenosis at C4-5. 3. Chronic left maxillary sinusitis.   Electronically Signed   By: Sherryl Barters M.D.   On: 12/21/2013 19:27      Assessment/Plan   1. Acute renal  failure/dehydration. 2. Hypotension causing #1, compounded by antihypertensive medications(ACE inhibitor and diuretic). 3. Bipolar disorder on multiple psychiatric medications, also causing hypotension. 4. Obesity. 5. History of sleep apnea, stable.  Plan: 1. Admit to medical floor. 2. Intravenous fluids. Follow renal function closely. 3. Discontinue antihypertensive medications for the time being. 4. She will need reevaluation of all her psychoactive medications by her psychiatrist as an outpatient.  Other recommendations will depend on patient's hospital progress.  Code Status: Full code.   Family Communication: I discussed the plan with the patient at the bedside.   Disposition Plan: Home when medically stable.   Time spent: 45 minutes.  [facility] Pager [phone].

## 2013-12-21 NOTE — ED Provider Notes (Signed)
CSN: [id number]     Arrival date & time 12/21/13  1559 History   First MD Initiated Contact with Patient 12/21/13 1802     Chief Complaint  Patient presents with  . Dizziness  . Fall     (Consider location/radiation/quality/duration/timing/severity/associated sxs/prior Treatment) HPI She complains of nausea and generalized weakness and lightheadedness onset approximately 8 PM last night. She fell last night when she lost her footing, causing her to strike her chest. She also struck her head also complains of neck pain since the event. She denies shortness of breath pain is pleuritic in nature moderate to severe, anterior nonradiating worse with deep inspirations not improved by anything. No treatment prior to coming here. He does admit to diminished appetite and nausea for the past few days. No other associated symptoms no treatment prior to coming here Past Medical History  Diagnosis Date  . Chronic back pain   . Bipolar 1 disorder   . Anxiety   . Depression   . Hypertension   . Sleep apnea   . On home oxygen therapy     3LPM PRN  . History of anemia   . GERD (gastroesophageal reflux disease)    Past Surgical History  Procedure Laterality Date  . Dilation and curettage of uterus  age 78  . Polypectomy  05/22/2012    Procedure: POLYPECTOMY;  Surgeon: Jonnie Kind, MD;  Location: [facility];  Service: Gynecology;  Laterality: N/A;  Endometrial Polypectomy  . Tubal ligation     Family History  Problem Relation Age of Onset  . Hypertension Mother   . Hypertension Brother   . Anxiety disorder Brother   . Depression Brother    History  Substance Use Topics  . Smoking status: Current Every Day Smoker -- 0.50 packs/day for 35 years    Types: Cigarettes  . Smokeless tobacco: Never Used  . Alcohol Use: Yes     Comment: occasionally   OB History   Grav Para Term Preterm Abortions TAB SAB Ect Mult Living   2 2 2       2      Review of Systems  Cardiovascular: Positive for  chest pain.  Musculoskeletal: Positive for neck pain.  Skin: Positive for wound.       Healing burn, less than 1% body surface area right volar wrist  Neurological: Positive for headaches.  All other systems reviewed and are negative.     Allergies  Review of patient's allergies indicates no known allergies.  Home Medications   Prior to Admission medications   Medication Sig Start Date End Date Taking? Authorizing Provider  ALPRAZolam Duanne Moron) 1 MG tablet Take 1 tablet (1 mg total) by mouth 3 (three) times daily. 12/11/12   Samuella Cota, MD  buPROPion (WELLBUTRIN XL) 300 MG 24 hr tablet Take 300 mg by mouth every morning.    Historical Provider, MD  busPIRone (BUSPAR) 15 MG tablet Take 15 mg by mouth 3 (three) times daily. 04/01/12   Delfina Redwood, MD  DULoxetine (CYMBALTA) 60 MG capsule Take 60 mg by mouth every morning.     Historical Provider, MD  gabapentin (NEURONTIN) 600 MG tablet Take 600 mg by mouth 3 (three) times daily.    Historical Provider, MD  oxybutynin (DITROPAN) 5 MG tablet Take 5 mg by mouth 3 (three) times daily.     Historical Provider, MD  OXYGEN-HELIUM IN Inhale 3 L into the lungs at bedtime.    Historical Provider, MD  phenazopyridine (  PYRIDIUM) 200 MG tablet Take 1 tablet (200 mg total) by mouth 3 (three) times daily. 05/23/13   [place], NP  risperiDONE (RISPERDAL) 2 MG tablet Take 1 tablet (2 mg total) by mouth at bedtime. 12/11/12   Samuella Cota, MD  sulfamethoxazole-trimethoprim (SEPTRA DS) 800-160 MG per tablet Take 1 tablet by mouth every 12 (twelve) hours. 05/23/13   [place], NP  traMADol (ULTRAM) 50 MG tablet Take 1 tablet (50 mg total) by mouth every 6 (six) hours as needed for pain. pain 12/11/12   Samuella Cota, MD  traZODone (DESYREL) 100 MG tablet Take 2 tablets (200 mg total) by mouth at bedtime. 12/11/12   Samuella Cota, MD   BP 91/63  Pulse 78  Temp(Src) 98 F (36.7 C) (Oral)  Resp 18  Ht 5\' 3"  (1.6 m)  Wt 220 lb (99.791  kg)  BMI 38.98 kg/m2  SpO2 95%  LMP 05/06/2012 Physical Exam  Nursing note and vitals reviewed. Constitutional: She is oriented to person, place, and time. She appears well-developed and well-nourished.  HENT:  Head: Normocephalic and atraumatic.  Eyes: Conjunctivae are normal. Pupils are equal, round, and reactive to light.  Neck: Neck supple. No tracheal deviation present. No thyromegaly present.  Cardiovascular: Normal rate and regular rhythm.   No murmur heard. Pulmonary/Chest: Effort normal and breath sounds normal.  Abdominal: Soft. Bowel sounds are normal. She exhibits no distension. There is no tenderness.  Musculoskeletal: Normal range of motion. She exhibits no edema and no tenderness.  4 extremities without bony tenderness, deformity or swelling neurovascular intact. Cervical spine is tender. Thoracic and lumbar spines nontender. Pelvis stable nontender.   Neurological: She is alert and oriented to person, place, and time. No cranial nerve deficit. Coordination normal.  Skin: Skin is warm and dry. No rash noted.  Left upper extremity with 3 cm purplish ecchymosis at medial aspect of upper arm. Right upper extremity with 3 cm area, pinkish and peeling in a volar wrist.  Psychiatric: She has a normal mood and affect.    ED Course  Procedures (including critical care time) Labs Review Labs Reviewed  CBC WITH DIFFERENTIAL - Abnormal; Notable for the following:    WBC 13.4 (*)    Neutro Abs 9.1 (*)    All other components within normal limits  COMPREHENSIVE METABOLIC PANEL - Abnormal; Notable for the following:    Chloride 94 (*)    BUN 30 (*)    Creatinine, Ser 2.83 (*)    Total Bilirubin <0.2 (*)    GFR calc non Af Amer 18 (*)    GFR calc Af Amer 21 (*)    All other components within normal limits  TROPONIN I    Imaging Review Dg Chest 2 View  12/21/2013   CLINICAL DATA:  DIZZINESS FALL  EXAM: CHEST  2 VIEW  COMPARISON:  DG CHEST 2 VIEW dated 12/10/2012  FINDINGS:  The heart size and mediastinal contours are within normal limits. Both lungs are clear. The visualized skeletal structures are unremarkable.  IMPRESSION: No active cardiopulmonary disease.   Electronically Signed   By: Margaree Mackintosh M.D.   On: 12/21/2013 16:50     EKG Interpretation   Date/Time:  Tuesday December 21 2013 16:19:09 EDT Ventricular Rate:  82 PR Interval:  180 QRS Duration: 96 QT Interval:  396 QTC Calculation: 462 R Axis:   62 Text Interpretation:  Normal sinus rhythm Low voltage QRS RSR' or QR  pattern in V1  suggests right ventricular conduction delay Nonspecific T  wave abnormality Prolonged QT When compared with ECG of 10-Dec-2012 16:34,  No significant change was found Confirmed by [facility]  MD, Jenny Reichmann ([id number]) on  12/21/2013 5:50:42 PM     Chest x-ray viewed by me Results for orders placed during the hospital encounter of 12/21/13  CBC WITH DIFFERENTIAL      Result Value Ref Range   WBC 13.4 (*) 4.0 - 10.5 K/uL   RBC 5.01  3.87 - 5.11 MIL/uL   Hemoglobin 15.0  12.0 - 15.0 g/dL   HCT 44.0  36.0 - 46.0 %   MCV 87.8  78.0 - 100.0 fL   MCH 29.9  26.0 - 34.0 pg   MCHC 34.1  30.0 - 36.0 g/dL   RDW 14.2  11.5 - 15.5 %   Platelets 369  150 - 400 K/uL   Neutrophils Relative % 68%  43 - 77 %   Neutro Abs 9.1 (*) 1.7 - 7.7 K/uL   Lymphocytes Relative 26%  12 - 46 %   Lymphs Abs 3.5  0.7 - 4.0 K/uL   Monocytes Relative 6%  3 - 12 %   Monocytes Absolute 0.8  0.1 - 1.0 K/uL   Eosinophils Relative 0%  0 - 5 %   Eosinophils Absolute 0.0  0.0 - 0.7 K/uL   Basophils Relative 0%  0 - 1 %   Basophils Absolute 0.0  0.0 - 0.1 K/uL  TROPONIN I      Result Value Ref Range   Troponin I <0.30  <0.30 ng/mL  COMPREHENSIVE METABOLIC PANEL      Result Value Ref Range   Sodium 138  137 - 147 mEq/L   Potassium 4.1  3.7 - 5.3 mEq/L   Chloride 94 (*) 96 - 112 mEq/L   CO2 28  19 - 32 mEq/L   Glucose, Bld 87  70 - 99 mg/dL   BUN 30 (*) 6 - 23 mg/dL   Creatinine, Ser 2.83 (*) 0.50 - 1.10  mg/dL   Calcium 10.0  8.4 - 10.5 mg/dL   Total Protein 7.6  6.0 - 8.3 g/dL   Albumin 3.8  3.5 - 5.2 g/dL   AST 14  0 - 37 U/L   ALT 23  0 - 35 U/L   Alkaline Phosphatase 108  39 - 117 U/L   Total Bilirubin <0.2 (*) 0.3 - 1.2 mg/dL   GFR calc non Af Amer 18 (*) >90 mL/min   GFR calc Af Amer 21 (*) >90 mL/min  CK      Result Value Ref Range   Total CK 49  7 - 177 U/L  URINALYSIS, ROUTINE W REFLEX MICROSCOPIC      Result Value Ref Range   Color, Urine YELLOW  YELLOW   APPearance CLEAR  CLEAR   Specific Gravity, Urine 1.015  1.005 - 1.030   pH 6.0  5.0 - 8.0   Glucose, UA NEGATIVE  NEGATIVE mg/dL   Hgb urine dipstick NEGATIVE  NEGATIVE   Bilirubin Urine NEGATIVE  NEGATIVE   Ketones, ur NEGATIVE  NEGATIVE mg/dL   Protein, ur NEGATIVE  NEGATIVE mg/dL   Urobilinogen, UA 0.2  0.0 - 1.0 mg/dL   Nitrite NEGATIVE  NEGATIVE   Leukocytes, UA NEGATIVE  NEGATIVE   Dg Chest 2 View  12/21/2013   CLINICAL DATA:  DIZZINESS FALL  EXAM: CHEST  2 VIEW  COMPARISON:  DG CHEST 2 VIEW dated 12/10/2012  FINDINGS: The heart  size and mediastinal contours are within normal limits. Both lungs are clear. The visualized skeletal structures are unremarkable.  IMPRESSION: No active cardiopulmonary disease.   Electronically Signed   By: Margaree Mackintosh M.D.   On: 12/21/2013 16:50   Ct Cervical Spine Wo Contrast  12/21/2013   CLINICAL DATA:  Fall yesterday.  Neck pain and tenderness.  EXAM: CT CERVICAL SPINE WITHOUT CONTRAST  TECHNIQUE: Multidetector CT imaging of the cervical spine was performed without intravenous contrast. Multiplanar CT image reconstructions were also generated.  COMPARISON:  DG CERVICAL SPINE COMPLETE dated 11/30/2011  FINDINGS: There is loss of the normal cervical lordosis with 1.5 mm of degenerative anterolisthesis at C4-5.  No prevertebral soft tissue swelling or cervical spine fracture is observed. Posterior osseous ridging and uncinate spurring cause considerable left foraminal stenosis at the C6-7  level, and facet spurring causes mild osseous foraminal stenosis on the left at C4-5. This spurring appears chronic.  Chronic left maxillary sinusitis is observed.  IMPRESSION: 1. No cervical spine fracture or acute subluxation is identified. 2. Spurring contributes to prominent left foraminal stenosis at C6-7 and mild bony left foraminal stenosis at C4-5. 3. Chronic left maxillary sinusitis.   Electronically Signed   By: Sherryl Barters M.D.   On: 12/21/2013 19:27    Bacitracin ointment ordered for healing burn of right wrist. 8 PM patient alert Glasgow Coma Score 15 appears comfortable MDM   Final diagnoses:  None   patient exhibiting new onset renal insufficiency and hypotension. No evidence of trauma other than bruised her arm and minor chest wall trauma. Abdominal exam is benign. Suspect she is overmedicated on blood pressure medicine. Patient also has polypharmacy. Spoke with Dr.Gosrani admit medical surgical floor Diagnosis #1 hypotension #2 renal insufficiency #3 contusion to chest wall      Orlie Dakin, MD 12/21/13 2153

## 2013-12-21 NOTE — ED Notes (Signed)
Patient c/o nausea and loss of appetite x3 days and dizziness x2 days. Patient reports falling yesterday and hitting chest and head. Denies any LOC. Per patient sharp chest pain with deep breaths and shortness of breath that started this morning.

## 2013-12-22 DIAGNOSIS — R0789 Other chest pain: Secondary | ICD-10-CM

## 2013-12-22 DIAGNOSIS — F411 Generalized anxiety disorder: Secondary | ICD-10-CM

## 2013-12-22 LAB — COMPREHENSIVE METABOLIC PANEL
ALT: 20 U/L (ref 0–35)
AST: 13 U/L (ref 0–37)
Albumin: 3.2 g/dL — ABNORMAL LOW (ref 3.5–5.2)
Alkaline Phosphatase: 91 U/L (ref 39–117)
BUN: 18 mg/dL (ref 6–23)
CO2: 27 mEq/L (ref 19–32)
Calcium: 9 mg/dL (ref 8.4–10.5)
Chloride: 103 mEq/L (ref 96–112)
Creatinine, Ser: 1.36 mg/dL — ABNORMAL HIGH (ref 0.50–1.10)
GFR calc Af Amer: 50 mL/min — ABNORMAL LOW (ref >90)
GFR calc non Af Amer: 44 mL/min — ABNORMAL LOW (ref >90)
Glucose, Bld: 127 mg/dL — ABNORMAL HIGH (ref 70–99)
Potassium: 4.1 mEq/L (ref 3.7–5.3)
Sodium: 141 mEq/L (ref 137–147)
Total Bilirubin: <0.2 mg/dL — ABNORMAL LOW (ref 0.3–1.2)
Total Protein: 6.6 g/dL (ref 6.0–8.3)

## 2013-12-22 LAB — CBC
HCT: 41.1 % (ref 36.0–46.0)
Hemoglobin: 13.8 g/dL (ref 12.0–15.0)
MCH: 29.8 pg (ref 26.0–34.0)
MCHC: 33.6 g/dL (ref 30.0–36.0)
MCV: 88.8 fL (ref 78.0–100.0)
Platelets: 343 10*3/uL (ref 150–400)
RBC: 4.63 MIL/uL (ref 3.87–5.11)
RDW: 14.4 % (ref 11.5–15.5)
WBC: 9.5 10*3/uL (ref 4.0–10.5)

## 2013-12-22 LAB — RAPID URINE DRUG SCREEN, HOSP PERFORMED
Amphetamines: NOT DETECTED
Barbiturates: NOT DETECTED
Benzodiazepines: POSITIVE — AB
Cocaine: NOT DETECTED
Opiates: NOT DETECTED
Tetrahydrocannabinol: POSITIVE — AB

## 2013-12-22 LAB — TROPONIN I
Troponin I: <0.3 ng/mL (ref <0.30)
Troponin I: <0.3 ng/mL (ref <0.30)

## 2013-12-22 MED ORDER — BACITRACIN-NEOMYCIN-POLYMYXIN 400-5-5000 EX OINT
TOPICAL_OINTMENT | Freq: Two times a day (BID) | CUTANEOUS | Status: DC
  Administered 2013-12-22 – 2013-12-23 (×3): 1 via TOPICAL
  Filled 2013-12-22 (×3): qty 1

## 2013-12-22 NOTE — Progress Notes (Signed)
PROGRESS NOTE  Tiffany Lynch OZD:[id number] DOB: 1961-05-06 DOA: 12/21/2013 PCP: No PCP Per Patient  Assessment/Plan: Acute kidney injury -Hypovolemia in the setting of diuretic and ACE inhibitor use -Continue IV fluids--improving -Baseline creatinine 0.8-1.0 Hypotension -Likely due to hypovolemia -A.m. Cortisol -Echocardiogram -Check orthostatics -TSH -Cycle troponins -Discontinue amlodipine, lisinopril, HCTZ -Echocardiogram Atypical chest discomfort -Cycle troponins -EKG reassuring Bipolar disorder -Continue home medications   Family Communication:   familyat beside Disposition Plan:   Home when medically stable       Procedures/Studies: Dg Chest 2 View  12/21/2013   CLINICAL DATA:  DIZZINESS FALL  EXAM: CHEST  2 VIEW  COMPARISON:  DG CHEST 2 VIEW dated 12/10/2012  FINDINGS: The heart size and mediastinal contours are within normal limits. Both lungs are clear. The visualized skeletal structures are unremarkable.  IMPRESSION: No active cardiopulmonary disease.   Electronically Signed   By: Margaree Mackintosh M.D.   On: 12/21/2013 16:50   Ct Cervical Spine Wo Contrast  12/21/2013   CLINICAL DATA:  Fall yesterday.  Neck pain and tenderness.  EXAM: CT CERVICAL SPINE WITHOUT CONTRAST  TECHNIQUE: Multidetector CT imaging of the cervical spine was performed without intravenous contrast. Multiplanar CT image reconstructions were also generated.  COMPARISON:  DG CERVICAL SPINE COMPLETE dated 11/30/2011  FINDINGS: There is loss of the normal cervical lordosis with 1.5 mm of degenerative anterolisthesis at C4-5.  No prevertebral soft tissue swelling or cervical spine fracture is observed. Posterior osseous ridging and uncinate spurring cause considerable left foraminal stenosis at the C6-7 level, and facet spurring causes mild osseous foraminal stenosis on the left at C4-5. This spurring appears chronic.  Chronic left maxillary sinusitis is observed.  IMPRESSION: 1. No cervical  spine fracture or acute subluxation is identified. 2. Spurring contributes to prominent left foraminal stenosis at C6-7 and mild bony left foraminal stenosis at C4-5. 3. Chronic left maxillary sinusitis.   Electronically Signed   By: Sherryl Barters M.D.   On: 12/21/2013 19:27         Subjective: Patient is feeling better. Less dizziness today. Denies any headache, visual disturbance, tremors breath, nausea, vomiting, diarrhea, vomiting, dysuria. She has chest discomfort.  Objective: Filed Vitals:   12/21/13 1920 12/21/13 2110 12/22/13 0422 12/22/13 1522  BP:  95/67 105/70 90/47  Pulse: 77 76 97 89  Temp:  97.9 F (36.6 C) 97.3 F (36.3 C) 98 F (36.7 C)  TempSrc:  Oral Oral   Resp:  18 18 18   Height:  5\' 3"  (1.6 m)    Weight:  100.517 kg (221 lb 9.6 oz)    SpO2: 90% 94% 91% 93%    Intake/Output Summary (Last 24 hours) at 12/22/13 1641 Last data filed at 12/22/13 1459  Gross per 24 hour  Intake    840 ml  Output   2100 ml  Net  -1260 ml   Weight change:  Exam:   General:  Pt is alert, follows commands appropriately, not in acute distress  HEENT: No icterus, No thrush,  [place]/AT  Cardiovascular: RRR, S1/S2, no rubs, no gallops  Respiratory: Bibasilar crackles. No wheezing. Good air movement.  Abdomen: Soft/+BS, non tender, non distended, no guarding  Extremities: trace LE edema, No lymphangitis, No petechiae, No rashes, no synovitis  Data Reviewed: Basic Metabolic Panel:  Recent Labs Lab 12/21/13 1646 12/22/13 0536  NA 138 141  K 4.1 4.1  CL 94* 103  CO2 28 27  GLUCOSE  87 127*  BUN 30* 18  CREATININE 2.83* 1.36*  CALCIUM 10.0 9.0   Liver Function Tests:  Recent Labs Lab 12/21/13 1646 12/22/13 0536  AST 14 13  ALT 23 20  ALKPHOS 108 91  BILITOT <0.2* <0.2*  PROT 7.6 6.6  ALBUMIN 3.8 3.2*   No results found for this basename: LIPASE, AMYLASE,  in the last 168 hours No results found for this basename: AMMONIA,  in the last 168  hours CBC:  Recent Labs Lab 12/21/13 1646 12/22/13 0536  WBC 13.4* 9.5  NEUTROABS 9.1*  --   HGB 15.0 13.8  HCT 44.0 41.1  MCV 87.8 88.8  PLT 369 343   Cardiac Enzymes:  Recent Labs Lab 12/21/13 1641 12/21/13 1646  CKTOTAL 49  --   TROPONINI  --  <0.30   BNP: No components found with this basename: POCBNP,  CBG: No results found for this basename: GLUCAP,  in the last 168 hours  No results found for this or any previous visit (from the past 240 hour(s)).   Scheduled Meds: . ALPRAZolam  1 mg Oral QID  . buPROPion  300 mg Oral q morning - 10a  . busPIRone  15 mg Oral TID  . cholecalciferol  400 Units Oral q morning - 10a  . DULoxetine  60 mg Oral Daily  . gabapentin  600 mg Oral TID  . heparin  5,000 Units Subcutaneous 3 times per day  . neomycin-bacitracin-polymyxin   Topical BID  . nicotine  21 mg Transdermal Once  . risperiDONE  2 mg Oral QHS  . traZODone  300 mg Oral QHS   Continuous Infusions: . sodium chloride 125 mL/hr at 12/22/13 1552     Orson Eva, DO  [facility] Pager [phone]  If 7PM-7AM, please contact night-coverage [email or web address] Password [email or web address] 12/22/2013, 4:41 PM   LOS: 1 day

## 2013-12-22 NOTE — Care Management Note (Signed)
    Page 1 of 1   12/22/2013     1:45:35 PM   CARE MANAGEMENT NOTE 12/22/2013  Patient:  Tiffany Lynch, Tiffany Lynch   Account Number:  [id number]  Date Initiated:  12/22/2013  Documentation initiated by:  Theophilus Kinds  Subjective/Objective Assessment:   Pt admitted from home with ARF. Pt lives with a friend and will return home at discharge. Pt is independent with ADL's. Pt receives PCP at the [facility].     Action/Plan:   No CM needs noted.   Anticipated DC Date:  12/24/2013   Anticipated DC Plan:  [place]  CM consult      Choice offered to / List presented to:             Status of service:  Completed, signed off Medicare Important Message given?   (If response is "NO", the following Medicare IM given date fields will be blank) Date Medicare IM given:   Date Additional Medicare IM given:    Discharge Disposition:  HOME/SELF CARE  Per UR Regulation:    If discussed at Long Length of Stay Meetings, dates discussed:    Comments:  12/22/13 [place], RN BSN CM

## 2013-12-22 NOTE — Care Management Utilization Note (Signed)
UR completed 

## 2013-12-23 DIAGNOSIS — I519 Heart disease, unspecified: Secondary | ICD-10-CM

## 2013-12-23 LAB — CBC
HCT: 45.4 % (ref 36.0–46.0)
Hemoglobin: 14.4 g/dL (ref 12.0–15.0)
MCH: 29.2 pg (ref 26.0–34.0)
MCHC: 31.7 g/dL (ref 30.0–36.0)
MCV: 92.1 fL (ref 78.0–100.0)
Platelets: 395 10*3/uL (ref 150–400)
RBC: 4.93 MIL/uL (ref 3.87–5.11)
RDW: 14.5 % (ref 11.5–15.5)
WBC: 8.6 10*3/uL (ref 4.0–10.5)

## 2013-12-23 LAB — BASIC METABOLIC PANEL
BUN: 13 mg/dL (ref 6–23)
CO2: 28 mEq/L (ref 19–32)
Calcium: 9.2 mg/dL (ref 8.4–10.5)
Chloride: 103 mEq/L (ref 96–112)
Creatinine, Ser: 1.04 mg/dL (ref 0.50–1.10)
GFR calc Af Amer: 70 mL/min — ABNORMAL LOW (ref >90)
GFR calc non Af Amer: 60 mL/min — ABNORMAL LOW (ref >90)
Glucose, Bld: 142 mg/dL — ABNORMAL HIGH (ref 70–99)
Potassium: 4.9 mEq/L (ref 3.7–5.3)
Sodium: 143 mEq/L (ref 137–147)

## 2013-12-23 LAB — TSH: TSH: 0.987 u[IU]/mL (ref 0.350–4.500)

## 2013-12-23 LAB — TROPONIN I: Troponin I: <0.3 ng/mL (ref <0.30)

## 2013-12-23 NOTE — Progress Notes (Signed)
Echocardiogram 2D Echocardiogram has been performed.  Tiffany Lynch Tiffany Lynch 12/23/2013, 12:55 PM

## 2013-12-23 NOTE — Progress Notes (Signed)
Patient discharged home.  IV removed - WNL.  Instructed on changes to medications.  Verbalizes understanding.  Advised to stop smoking.  No questions at this time.  Stable to DC home, ambulated off floor with assist of RN.

## 2013-12-23 NOTE — Discharge Summary (Signed)
Physician Discharge Summary  [name] Lynch:[id number] DOB: Nov 22, 1960 DOA: 12/21/2013  PCP: No PCP Per Patient  Admit date: 12/21/2013 Discharge date: 12/23/2013  Recommendations for Outpatient Follow-up:  1. Pt will need to follow up with PCP in 2 weeks post discharge 2. Please obtain BMP to evaluate electrolytes and kidney function 3. Please also check CBC to evaluate Hg and Hct levels  Discharge Diagnoses:  Active Problems:   Bipolar 1 disorder   Dehydration   Hypotension   ARF (acute renal failure)   Atypical chest pain Acute kidney injury  -Hypovolemia in the setting of diuretic and ACE inhibitor use  -Continue IV fluids--improving  -Baseline creatinine 0.8-1.0  -Serum creatinine 1.04 on the day of discharge Hypotension  -Likely due to hypovolemia--> improved with IV fluid hydration -A.m. Cortisol--pending at the time of discharge, please follow up on results  -Echocardiogram--EF 31-49%, grade 1 diastolic function, no wall motion abnormality  -Check orthostatics--negative -TSH--0.987  -Cycle troponins--negative x3  -Discontinue amlodipine, lisinopril, HCTZ  -The patient will remain off of amlodipine, HCTZ, lisinopril. The patient was instructed to follow up with her primary care provider for blood pressure check to determine if she needs to be restarted on her antihypertensive medications Atypical chest discomfort  -Cycle troponins--negative x 3  -EKG reassuring  Bipolar disorder  -Continue home medications   Discharge Condition:  Stable Disposition:  home   Diet:cardiac Wt Readings from Last 3 Encounters:  12/21/13 100.517 kg (221 lb 9.6 oz)  05/23/13 97.07 kg (214 lb)  12/10/12 104.6 kg (230 lb 9.6 oz)    History of present illness:  53 year old female with a history of hypertension, bipolar disorder, chronic back pain, and sleep apnea presented with dizziness resulting in a mechanical fall hurting her chest and resulting in musculoskeletal pain. The  patient was noted to be relatively hypotensive at the time of admission with systolic blood pressure in the low 90s. The patient was started on aggressive fluid resuscitation. She was noted to be in acute renal failure with a serum creatinine of 2.83. Her serum creatinine improved to 1.04 on the day of discharge. The patient's dizziness clinically improved. The patient's blood pressure remained stable off of antihypertensive medications. She was instructed to stay off of her antihypertensive medications until she follows up with her primary care provider who will determine further need for her medications. The patient had a urine drug screen positive for benzodiazepines and THC.      Discharge Exam: Filed Vitals:   12/23/13 0611  BP: 126/84  Pulse: 103  Temp: 98 F (36.7 C)  Resp: 18   Filed Vitals:   12/22/13 1656 12/22/13 1657 12/22/13 2313 12/23/13 0611  BP: 113/71 108/68 113/72 126/84  Pulse: 90 97 106 103  Temp:   98.2 F (36.8 C) 98 F (36.7 C)  TempSrc:   Oral Oral  Resp:   18 18  Height:      Weight:      SpO2: 95% 100% 100% 92%   General: A&O x 3, NAD, pleasant, cooperative Cardiovascular: RRR, no rub, no gallop, no S3 Respiratory: Diminished breath sounds at the bases but clear to auscultation. No wheezing.  Abdomen:soft, nontender, nondistended, positive bowel sounds Extremities: No edema, No lymphangitis, no petechiae  Discharge Instructions     Medication List    ASK your doctor about these medications       ALPRAZolam 1 MG tablet  Commonly known as:  XANAX  Take 1 mg by mouth 4 (four) times  daily.     amLODipine 2.5 MG tablet  Commonly known as:  NORVASC  Take 2.5 mg by mouth daily.     buPROPion 300 MG 24 hr tablet  Commonly known as:  WELLBUTRIN XL  Take 300 mg by mouth every morning.     busPIRone 15 MG tablet  Commonly known as:  BUSPAR  Take 15 mg by mouth 3 (three) times daily.     cephALEXin 500 MG capsule  Commonly known as:  KEFLEX    Take 500 mg by mouth 3 (three) times daily. 7 day course starting on 12/17/2013     cholecalciferol 400 UNITS Tabs tablet  Commonly known as:  VITAMIN D  Take 400 Units by mouth every morning.     DULoxetine 60 MG capsule  Commonly known as:  CYMBALTA  Take 60 mg by mouth every morning.     gabapentin 600 MG tablet  Commonly known as:  NEURONTIN  Take 600 mg by mouth 3 (three) times daily.     HYDROcodone-acetaminophen 5-325 MG per tablet  Commonly known as:  NORCO/VICODIN  Take 1 tablet by mouth every 4 (four) hours as needed for moderate pain (started on 12/17/2013 #15).     lisinopril-hydrochlorothiazide 10-12.5 MG per tablet  Commonly known as:  PRINZIDE,ZESTORETIC  Take 1 tablet by mouth daily.     multivitamin with minerals Tabs tablet  Take 1 tablet by mouth every morning.     oxyCODONE-acetaminophen 5-325 MG per tablet  Commonly known as:  PERCOCET/ROXICET  Take 1 tablet by mouth every 4 (four) hours as needed for severe pain (12/10/2013 #30).     risperiDONE 2 MG tablet  Commonly known as:  RISPERDAL  Take 1 tablet (2 mg total) by mouth at bedtime.     traMADol 50 MG tablet  Commonly known as:  ULTRAM  Take 50-100 mg by mouth every 6 (six) hours as needed for moderate pain.     traZODone 100 MG tablet  Commonly known as:  DESYREL  Take 300 mg by mouth at bedtime.         The results of significant diagnostics from this hospitalization (including imaging, microbiology, ancillary and laboratory) are listed below for reference.    Significant Diagnostic Studies: Dg Chest 2 View  12/21/2013   CLINICAL DATA:  DIZZINESS FALL  EXAM: CHEST  2 VIEW  COMPARISON:  DG CHEST 2 VIEW dated 12/10/2012  FINDINGS: The heart size and mediastinal contours are within normal limits. Both lungs are clear. The visualized skeletal structures are unremarkable.  IMPRESSION: No active cardiopulmonary disease.   Electronically Signed   By: Margaree Mackintosh M.D.   On: 12/21/2013 16:50   Ct  Cervical Spine Wo Contrast  12/21/2013   CLINICAL DATA:  Fall yesterday.  Neck pain and tenderness.  EXAM: CT CERVICAL SPINE WITHOUT CONTRAST  TECHNIQUE: Multidetector CT imaging of the cervical spine was performed without intravenous contrast. Multiplanar CT image reconstructions were also generated.  COMPARISON:  DG CERVICAL SPINE COMPLETE dated 11/30/2011  FINDINGS: There is loss of the normal cervical lordosis with 1.5 mm of degenerative anterolisthesis at C4-5.  No prevertebral soft tissue swelling or cervical spine fracture is observed. Posterior osseous ridging and uncinate spurring cause considerable left foraminal stenosis at the C6-7 level, and facet spurring causes mild osseous foraminal stenosis on the left at C4-5. This spurring appears chronic.  Chronic left maxillary sinusitis is observed.  IMPRESSION: 1. No cervical spine fracture or acute subluxation is identified. 2.  Spurring contributes to prominent left foraminal stenosis at C6-7 and mild bony left foraminal stenosis at C4-5. 3. Chronic left maxillary sinusitis.   Electronically Signed   By: Sherryl Barters M.D.   On: 12/21/2013 19:27     Microbiology: No results found for this or any previous visit (from the past 240 hour(s)).   Labs: Basic Metabolic Panel:  Recent Labs Lab 12/21/13 1646 12/22/13 0536 12/23/13 0532  NA 138 141 143  K 4.1 4.1 4.9  CL 94* 103 103  CO2 28 27 28   GLUCOSE 87 127* 142*  BUN 30* 18 13  CREATININE 2.83* 1.36* 1.04  CALCIUM 10.0 9.0 9.2   Liver Function Tests:  Recent Labs Lab 12/21/13 1646 12/22/13 0536  AST 14 13  ALT 23 20  ALKPHOS 108 91  BILITOT <0.2* <0.2*  PROT 7.6 6.6  ALBUMIN 3.8 3.2*   No results found for this basename: LIPASE, AMYLASE,  in the last 168 hours No results found for this basename: AMMONIA,  in the last 168 hours CBC:  Recent Labs Lab 12/21/13 1646 12/22/13 0536 12/23/13 0532  WBC 13.4* 9.5 8.6  NEUTROABS 9.1*  --   --   HGB 15.0 13.8 14.4  HCT  44.0 41.1 45.4  MCV 87.8 88.8 92.1  PLT 369 343 395   Cardiac Enzymes:  Recent Labs Lab 12/21/13 1641 12/21/13 1646 12/22/13 1705 12/22/13 2251 12/23/13 0532  CKTOTAL 49  --   --   --   --   TROPONINI  --  <0.30 <0.30 <0.30 <0.30   BNP: No components found with this basename: POCBNP,  CBG: No results found for this basename: GLUCAP,  in the last 168 hours  Time coordinating discharge:  Greater than 30 minutes  Signed:  Orson Eva, DO [facility] Pager: [phone] 12/23/2013, 12:32 PM

## 2013-12-24 LAB — CORTISOL-AM, BLOOD: Cortisol - AM: 18.3 ug/dL (ref 4.3–22.4)

## 2013-12-27 ENCOUNTER — Encounter (HOSPITAL_COMMUNITY): Payer: Self-pay | Admitting: Emergency Medicine

## 2013-12-27 DIAGNOSIS — F319 Bipolar disorder, unspecified: Secondary | ICD-10-CM | POA: Insufficient documentation

## 2013-12-27 DIAGNOSIS — R197 Diarrhea, unspecified: Secondary | ICD-10-CM | POA: Insufficient documentation

## 2013-12-27 DIAGNOSIS — F17200 Nicotine dependence, unspecified, uncomplicated: Secondary | ICD-10-CM | POA: Insufficient documentation

## 2013-12-27 DIAGNOSIS — F411 Generalized anxiety disorder: Secondary | ICD-10-CM | POA: Insufficient documentation

## 2013-12-27 DIAGNOSIS — R6883 Chills (without fever): Secondary | ICD-10-CM | POA: Insufficient documentation

## 2013-12-27 DIAGNOSIS — Z79899 Other long term (current) drug therapy: Secondary | ICD-10-CM | POA: Insufficient documentation

## 2013-12-27 DIAGNOSIS — Z862 Personal history of diseases of the blood and blood-forming organs and certain disorders involving the immune mechanism: Secondary | ICD-10-CM | POA: Insufficient documentation

## 2013-12-27 DIAGNOSIS — I10 Essential (primary) hypertension: Secondary | ICD-10-CM | POA: Insufficient documentation

## 2013-12-27 DIAGNOSIS — R109 Unspecified abdominal pain: Secondary | ICD-10-CM | POA: Insufficient documentation

## 2013-12-27 DIAGNOSIS — IMO0001 Reserved for inherently not codable concepts without codable children: Secondary | ICD-10-CM | POA: Insufficient documentation

## 2013-12-27 DIAGNOSIS — G8929 Other chronic pain: Secondary | ICD-10-CM | POA: Insufficient documentation

## 2013-12-27 DIAGNOSIS — Z8719 Personal history of other diseases of the digestive system: Secondary | ICD-10-CM | POA: Insufficient documentation

## 2013-12-27 DIAGNOSIS — R51 Headache: Secondary | ICD-10-CM | POA: Insufficient documentation

## 2013-12-27 DIAGNOSIS — M549 Dorsalgia, unspecified: Secondary | ICD-10-CM | POA: Insufficient documentation

## 2013-12-27 DIAGNOSIS — R112 Nausea with vomiting, unspecified: Secondary | ICD-10-CM | POA: Insufficient documentation

## 2013-12-27 NOTE — ED Notes (Signed)
Vomiting, diarrhea, ,feels "light headed".  Recent adm with "kidney problem"

## 2013-12-28 ENCOUNTER — Emergency Department (HOSPITAL_COMMUNITY)
Admission: EM | Admit: 2013-12-28 | Discharge: 2013-12-28 | Disposition: A | Discharge status: Home / Self Care | Payer: Medicaid Other | Attending: Emergency Medicine | Admitting: Emergency Medicine

## 2013-12-28 DIAGNOSIS — R112 Nausea with vomiting, unspecified: Secondary | ICD-10-CM

## 2013-12-28 LAB — COMPREHENSIVE METABOLIC PANEL
ALT: 38 U/L — ABNORMAL HIGH (ref 0–35)
AST: 22 U/L (ref 0–37)
Albumin: 3.6 g/dL (ref 3.5–5.2)
Alkaline Phosphatase: 88 U/L (ref 39–117)
BUN: 15 mg/dL (ref 6–23)
CO2: 28 mEq/L (ref 19–32)
Calcium: 10 mg/dL (ref 8.4–10.5)
Chloride: 98 mEq/L (ref 96–112)
Creatinine, Ser: 0.93 mg/dL (ref 0.50–1.10)
GFR calc Af Amer: 80 mL/min — ABNORMAL LOW (ref >90)
GFR calc non Af Amer: 69 mL/min — ABNORMAL LOW (ref >90)
Glucose, Bld: 109 mg/dL — ABNORMAL HIGH (ref 70–99)
Potassium: 3.9 mEq/L (ref 3.7–5.3)
Sodium: 139 mEq/L (ref 137–147)
Total Bilirubin: 0.3 mg/dL (ref 0.3–1.2)
Total Protein: 6.6 g/dL (ref 6.0–8.3)

## 2013-12-28 LAB — CBC WITH DIFFERENTIAL/PLATELET
Basophils Absolute: 0 10*3/uL (ref 0.0–0.1)
Basophils Relative: 0 % (ref 0–1)
Eosinophils Absolute: 0.1 10*3/uL (ref 0.0–0.7)
Eosinophils Relative: 1 % (ref 0–5)
HCT: 38.4 % (ref 36.0–46.0)
Hemoglobin: 12.9 g/dL (ref 12.0–15.0)
Lymphocytes Relative: 26 % (ref 12–46)
Lymphs Abs: 3 10*3/uL (ref 0.7–4.0)
MCH: 29.7 pg (ref 26.0–34.0)
MCHC: 33.6 g/dL (ref 30.0–36.0)
MCV: 88.5 fL (ref 78.0–100.0)
Monocytes Absolute: 0.6 10*3/uL (ref 0.1–1.0)
Monocytes Relative: 5 % (ref 3–12)
Neutro Abs: 7.6 10*3/uL (ref 1.7–7.7)
Neutrophils Relative %: 68 % (ref 43–77)
Platelets: 333 10*3/uL (ref 150–400)
RBC: 4.34 MIL/uL (ref 3.87–5.11)
RDW: 14.2 % (ref 11.5–15.5)
WBC: 11.3 10*3/uL — ABNORMAL HIGH (ref 4.0–10.5)

## 2013-12-28 LAB — URINALYSIS, ROUTINE W REFLEX MICROSCOPIC
Bilirubin Urine: NEGATIVE
Glucose, UA: NEGATIVE mg/dL
Hgb urine dipstick: NEGATIVE
Ketones, ur: NEGATIVE mg/dL
Leukocytes, UA: NEGATIVE
Nitrite: NEGATIVE
Protein, ur: NEGATIVE mg/dL
Specific Gravity, Urine: 1.015 (ref 1.005–1.030)
Urobilinogen, UA: 0.2 mg/dL (ref 0.0–1.0)
pH: 7.5 (ref 5.0–8.0)

## 2013-12-28 MED ORDER — SODIUM CHLORIDE 0.9 % IV SOLN
1000.0000 mL | Freq: Once | INTRAVENOUS | Status: AC
  Administered 2013-12-28: 1000 mL via INTRAVENOUS

## 2013-12-28 MED ORDER — ONDANSETRON HCL 4 MG/2ML IJ SOLN
4.0000 mg | Freq: Once | INTRAMUSCULAR | Status: AC
  Administered 2013-12-28: 4 mg via INTRAVENOUS
  Filled 2013-12-28: qty 2

## 2013-12-28 MED ORDER — ONDANSETRON HCL 4 MG PO TABS: 4.0000 mg | ORAL_TABLET | Freq: Four times a day (QID) | ORAL | Status: DC | PRN

## 2013-12-28 MED ORDER — SODIUM CHLORIDE 0.9 % IV SOLN: 1000.0000 mL | INTRAVENOUS | Status: DC

## 2013-12-28 NOTE — ED Provider Notes (Signed)
CSN: [id number]     Arrival date & time 12/27/13  2112 History  This chart was scribed for Delora Fuel, MD by Elby Beck, ED Scribe. This patient was seen in room APA08/APA08 and the patient's care was started at 12:29 AM.   Chief Complaint  Patient presents with  . Emesis    The history is provided by the patient. No language interpreter was used.    HPI Comments: Tiffany Lynch is a 53 y.o. female who presents to the Emergency Department complaining of multiple episodes of emesis over the past 2 days. She reports associated lightheadedness, chills, generalized myalgias and 3 episodes of diarrhea onset today. She reports that she has been feeling nauseated over the past 1-2 weeks. She states that she has been taking Tums an Pepto bismol with mild relief of her symptoms. She states that she was recently admitted to the hospital and told she had a "kidney problem". She states that she has back pain at baseline with no recent changes. She denies abdominal pain, fever or any other symptoms. She states that she smoker about 0.5 pack/day.  PCP- [facility] Department    Past Medical History  Diagnosis Date  . Chronic back pain   . Bipolar 1 disorder   . Anxiety   . Depression   . Hypertension   . Sleep apnea   . On home oxygen therapy     3LPM PRN  . History of anemia   . GERD (gastroesophageal reflux disease)    Past Surgical History  Procedure Laterality Date  . Dilation and curettage of uterus  age 65  . Polypectomy  05/22/2012    Procedure: POLYPECTOMY;  Surgeon: Jonnie Kind, MD;  Location: [facility];  Service: Gynecology;  Laterality: N/A;  Endometrial Polypectomy  . Tubal ligation     Family History  Problem Relation Age of Onset  . Hypertension Mother   . Hypertension Brother   . Anxiety disorder Brother   . Depression Brother    History  Substance Use Topics  . Smoking status: Current Every Day Smoker -- 0.50 packs/day for 35 years    Types: Cigarettes  .  Smokeless tobacco: Never Used  . Alcohol Use: Yes     Comment: occasionally   OB History   Grav Para Term Preterm Abortions TAB SAB Ect Mult Living   2 2 2       2      Review of Systems  Constitutional: Positive for chills. Negative for fever.  Gastrointestinal: Positive for nausea, vomiting and diarrhea. Negative for abdominal pain.  Musculoskeletal: Positive for back pain (baseline, no recent changes) and myalgias (generalized).  Neurological: Positive for light-headedness.  All other systems reviewed and are negative.   Allergies  Review of patient's allergies indicates no known allergies.  Home Medications   Prior to Admission medications   Medication Sig Start Date End Date Taking? Authorizing Provider  ALPRAZolam Duanne Moron) 1 MG tablet Take 1 mg by mouth 4 (four) times daily.    Historical Provider, MD  buPROPion (WELLBUTRIN XL) 300 MG 24 hr tablet Take 300 mg by mouth every morning.    Historical Provider, MD  busPIRone (BUSPAR) 15 MG tablet Take 15 mg by mouth 3 (three) times daily. 04/01/12   Delfina Redwood, MD  cholecalciferol (VITAMIN D) 400 UNITS TABS tablet Take 400 Units by mouth every morning.    Historical Provider, MD  DULoxetine (CYMBALTA) 60 MG capsule Take 60 mg by mouth every morning.  Historical Provider, MD  gabapentin (NEURONTIN) 600 MG tablet Take 600 mg by mouth 3 (three) times daily.    Historical Provider, MD  HYDROcodone-acetaminophen (NORCO/VICODIN) 5-325 MG per tablet Take 1 tablet by mouth every 4 (four) hours as needed for moderate pain (started on 12/17/2013 #15).    Historical Provider, MD  Multiple Vitamin (MULTIVITAMIN WITH MINERALS) TABS tablet Take 1 tablet by mouth every morning.    Historical Provider, MD  oxyCODONE-acetaminophen (PERCOCET/ROXICET) 5-325 MG per tablet Take 1 tablet by mouth every 4 (four) hours as needed for severe pain (12/10/2013 #30).    Historical Provider, MD  risperiDONE (RISPERDAL) 2 MG tablet Take 1 tablet (2 mg total)  by mouth at bedtime. 12/11/12   Samuella Cota, MD  traMADol (ULTRAM) 50 MG tablet Take 50-100 mg by mouth every 6 (six) hours as needed for moderate pain.    Historical Provider, MD  traZODone (DESYREL) 100 MG tablet Take 300 mg by mouth at bedtime.    Historical Provider, MD   Triage Vitals: BP 136/85  Pulse 78  Temp(Src) 97.9 F (36.6 C) (Oral)  Resp 20  Ht 5\' 3"  (1.6 m)  Wt 210 lb (95.255 kg)  BMI 37.21 kg/m2  SpO2 96%  LMP 05/06/2012  Physical Exam  Nursing note and vitals reviewed. Constitutional: She is oriented to person, place, and time. She appears well-developed and well-nourished. No distress.  HENT:  Head: Normocephalic and atraumatic.  Mouth/Throat: Oropharynx is clear and moist.  Eyes: EOM are normal. Pupils are equal, round, and reactive to light.  Neck: Normal range of motion. Neck supple. No JVD present. No tracheal deviation present.  Cardiovascular: Normal rate, regular rhythm and normal heart sounds.   No murmur heard. Pulmonary/Chest: Effort normal and breath sounds normal. No respiratory distress. She has no wheezes. She has no rales.  Abdominal: Soft. She exhibits no mass. There is tenderness. There is no rebound and no guarding.  Mild tenderness diffusely. Bowel sounds decreased.  Musculoskeletal: Normal range of motion. She exhibits no edema.  Lymphadenopathy:    She has no cervical adenopathy.  Neurological: She is alert and oriented to person, place, and time. No cranial nerve deficit. Coordination normal.  Skin: Skin is warm and dry. No rash noted.  Psychiatric: She has a normal mood and affect. Her behavior is normal. Thought content normal.    ED Course  Procedures (including critical care time)  DIAGNOSTIC STUDIES: Oxygen Saturation is 96on RA, adequate by my interpretation.    COORDINATION OF CARE: 12:36 AM- Discussed plan to obtain diagnostic lab work. Will also order medications.Pt advised of plan for treatment and pt  agrees.  Medications  ondansetron (ZOFRAN) injection 4 mg (not administered)  0.9 %  sodium chloride infusion (not administered)    Followed by  0.9 %  sodium chloride infusion (not administered)   Labs Review Results for orders placed during the hospital encounter of 12/28/13  CBC WITH DIFFERENTIAL      Result Value Ref Range   WBC 11.3 (*) 4.0 - 10.5 K/uL   RBC 4.34  3.87 - 5.11 MIL/uL   Hemoglobin 12.9  12.0 - 15.0 g/dL   HCT 38.4  36.0 - 46.0 %   MCV 88.5  78.0 - 100.0 fL   MCH 29.7  26.0 - 34.0 pg   MCHC 33.6  30.0 - 36.0 g/dL   RDW 14.2  11.5 - 15.5 %   Platelets 333  150 - 400 K/uL   Neutrophils Relative %  68  43 - 77 %   Neutro Abs 7.6  1.7 - 7.7 K/uL   Lymphocytes Relative 26  12 - 46 %   Lymphs Abs 3.0  0.7 - 4.0 K/uL   Monocytes Relative 5  3 - 12 %   Monocytes Absolute 0.6  0.1 - 1.0 K/uL   Eosinophils Relative 1  0 - 5 %   Eosinophils Absolute 0.1  0.0 - 0.7 K/uL   Basophils Relative 0  0 - 1 %   Basophils Absolute 0.0  0.0 - 0.1 K/uL  COMPREHENSIVE METABOLIC PANEL      Result Value Ref Range   Sodium 139  137 - 147 mEq/L   Potassium 3.9  3.7 - 5.3 mEq/L   Chloride 98  96 - 112 mEq/L   CO2 28  19 - 32 mEq/L   Glucose, Bld 109 (*) 70 - 99 mg/dL   BUN 15  6 - 23 mg/dL   Creatinine, Ser 0.93  0.50 - 1.10 mg/dL   Calcium 10.0  8.4 - 10.5 mg/dL   Total Protein 6.6  6.0 - 8.3 g/dL   Albumin 3.6  3.5 - 5.2 g/dL   AST 22  0 - 37 U/L   ALT 38 (*) 0 - 35 U/L   Alkaline Phosphatase 88  39 - 117 U/L   Total Bilirubin 0.3  0.3 - 1.2 mg/dL   GFR calc non Af Amer 69 (*) >90 mL/min   GFR calc Af Amer 80 (*) >90 mL/min  URINALYSIS, ROUTINE W REFLEX MICROSCOPIC      Result Value Ref Range   Color, Urine YELLOW  YELLOW   APPearance CLEAR  CLEAR   Specific Gravity, Urine 1.015  1.005 - 1.030   pH 7.5  5.0 - 8.0   Glucose, UA NEGATIVE  NEGATIVE mg/dL   Hgb urine dipstick NEGATIVE  NEGATIVE   Bilirubin Urine NEGATIVE  NEGATIVE   Ketones, ur NEGATIVE  NEGATIVE mg/dL    Protein, ur NEGATIVE  NEGATIVE mg/dL   Urobilinogen, UA 0.2  0.0 - 1.0 mg/dL   Nitrite NEGATIVE  NEGATIVE   Leukocytes, UA NEGATIVE  NEGATIVE   MDM   Final diagnoses:  Nausea & vomiting    Nausea and vomiting with chills suggestive of viral illness. No findings on exam to suggest more serious pathology. She is given IV hydration and IV ondansetron with good symptomatic relief. Because of complaints of dizziness, orthostatic vital signs were obtained and showed no abnormal troponin blood pressure or rise in pulse. She is discharged with prescription for ondansetron.   I personally performed the services described in this documentation, which was scribed in my presence. The recorded information has been reviewed and is accurate.    Delora Fuel, MD 09/47/09 [id number]

## 2013-12-28 NOTE — Discharge Instructions (Signed)
Nausea and Vomiting Nausea is a sick feeling that often comes before throwing up (vomiting). Vomiting is a reflex where stomach contents come out of your mouth. Vomiting can cause severe loss of body fluids (dehydration). Children and elderly adults can become dehydrated quickly, especially if they also have diarrhea. Nausea and vomiting are symptoms of a condition or disease. It is important to find the cause of your symptoms. CAUSES   Direct irritation of the stomach lining. This irritation can result from increased acid production (gastroesophageal reflux disease), infection, food poisoning, taking certain medicines (such as nonsteroidal anti-inflammatory drugs), alcohol use, or tobacco use.  Signals from the brain.These signals could be caused by a headache, heat exposure, an inner ear disturbance, increased pressure in the brain from injury, infection, a tumor, or a concussion, pain, emotional stimulus, or metabolic problems.  An obstruction in the gastrointestinal tract (bowel obstruction).  Illnesses such as diabetes, hepatitis, gallbladder problems, appendicitis, kidney problems, cancer, sepsis, atypical symptoms of a heart attack, or eating disorders.  Medical treatments such as chemotherapy and radiation.  Receiving medicine that makes you sleep (general anesthetic) during surgery. DIAGNOSIS Your caregiver may ask for tests to be done if the problems do not improve after a few days. Tests may also be done if symptoms are severe or if the reason for the nausea and vomiting is not clear. Tests may include:  Urine tests.  Blood tests.  Stool tests.  Cultures (to look for evidence of infection).  X-rays or other imaging studies. Test results can help your caregiver make decisions about treatment or the need for additional tests. TREATMENT You need to stay well hydrated. Drink frequently but in small amounts.You may wish to drink water, sports drinks, clear broth, or eat frozen  ice pops or gelatin dessert to help stay hydrated.When you eat, eating slowly may help prevent nausea.There are also some antinausea medicines that may help prevent nausea. HOME CARE INSTRUCTIONS   Take all medicine as directed by your caregiver.  If you do not have an appetite, do not force yourself to eat. However, you must continue to drink fluids.  If you have an appetite, eat a normal diet unless your caregiver tells you differently.  Eat a variety of complex carbohydrates (rice, wheat, potatoes, bread), lean meats, yogurt, fruits, and vegetables.  Avoid high-fat foods because they are more difficult to digest.  Drink enough water and fluids to keep your urine clear or pale yellow.  If you are dehydrated, ask your caregiver for specific rehydration instructions. Signs of dehydration may include:  Severe thirst.  Dry lips and mouth.  Dizziness.  Dark urine.  Decreasing urine frequency and amount.  Confusion.  Rapid breathing or pulse. SEEK IMMEDIATE MEDICAL CARE IF:   You have blood or brown flecks (like coffee grounds) in your vomit.  You have black or bloody stools.  You have a severe headache or stiff neck.  You are confused.  You have severe abdominal pain.  You have chest pain or trouble breathing.  You do not urinate at least once every 8 hours.  You develop cold or clammy skin.  You continue to vomit for longer than 24 to 48 hours.  You have a fever. MAKE SURE YOU:   Understand these instructions.  Will watch your condition.  Will get help right away if you are not doing well or get worse. Document Released: 08/26/2005 Document Revised: 11/18/2011 Document Reviewed: 01/23/2011 [facility] Patient Information 2014 [place], [place].  Ondansetron tablets What is  this medicine? °ONDANSETRON (on DAN se tron) is used to treat nausea and vomiting caused by chemotherapy. It is also used to prevent or treat nausea and vomiting after surgery. °This  medicine may be used for other purposes; ask your health care provider or pharmacist if you have questions. °COMMON BRAND NAME(S): Zofran °What should I tell my health care provider before I take this medicine? °They need to know if you have any of these conditions: °-heart disease °-history of irregular heartbeat °-liver disease °-low levels of magnesium or potassium in the blood °-an unusual or allergic reaction to ondansetron, granisetron, other medicines, foods, dyes, or preservatives °-pregnant or trying to get pregnant °-breast-feeding °How should I use this medicine? °Take this medicine by mouth with a glass of water. Follow the directions on your prescription label. Take your doses at regular intervals. Do not take your medicine more often than directed. °Talk to your pediatrician regarding the use of this medicine in children. Special care may be needed. °Overdosage: If you think you have taken too much of this medicine contact a poison control center or emergency room at once. °NOTE: This medicine is only for you. Do not share this medicine with others. °What if I miss a dose? °If you miss a dose, take it as soon as you can. If it is almost time for your next dose, take only that dose. Do not take double or extra doses. °What may interact with this medicine? °Do not take this medicine with any of the following medications: °-apomorphine °-certain medicines for fungal infections like fluconazole, itraconazole, ketoconazole, posaconazole, voriconazole °-cisapride °-dofetilide °-dronedarone °-pimozide °-thioridazine °-ziprasidone  °This medicine may also interact with the following medications: °-carbamazepine °-certain medicines for depression, anxiety, or psychotic disturbances °-fentanyl °-linezolid °-MAOIs like Carbex, Eldepryl, Marplan, Nardil, and Parnate °-methylene blue (injected into a vein) °-other medicines that prolong the QT interval (cause an abnormal heart  rhythm) °-phenytoin °-rifampicin °-tramadol °This list may not describe all possible interactions. Give your health care provider a list of all the medicines, herbs, non-prescription drugs, or dietary supplements you use. Also tell them if you smoke, drink alcohol, or use illegal drugs. Some items may interact with your medicine. °What should I watch for while using this medicine? °Check with your doctor or health care professional right away if you have any sign of an allergic reaction. °What side effects may I notice from receiving this medicine? °Side effects that you should report to your doctor or health care professional as soon as possible: °-allergic reactions like skin rash, itching or hives, swelling of the face, lips or tongue °-breathing problems °-confusion °-dizziness °-fast or irregular heartbeat °-feeling faint or lightheaded, falls °-fever and chills °-loss of balance or coordination °-seizures °-sweating °-swelling of the hands or feet °-tightness in the chest °-tremors °-unusually weak or tired °Side effects that usually do not require medical attention (report to your doctor or health care professional if they continue or are bothersome): °-constipation or diarrhea °-headache °This list may not describe all possible side effects. Call your doctor for medical advice about side effects. You may report side effects to FDA at 1-800-FDA-1088. °Where should I keep my medicine? °Keep out of the reach of children. °Store between 2 and 30 degrees C (36 and 86 degrees F). Throw away any unused medicine after the expiration date. °NOTE: This sheet is a summary. It may not cover all possible information. If you have questions about this medicine, talk to your doctor, pharmacist, or health   care provider.  2014, [employer]. (2013-06-02 16:27:45)

## 2014-01-21 ENCOUNTER — Other Ambulatory Visit (HOSPITAL_COMMUNITY): Payer: Self-pay | Admitting: Nurse Practitioner

## 2014-01-21 DIAGNOSIS — N649 Disorder of breast, unspecified: Secondary | ICD-10-CM

## 2014-02-02 ENCOUNTER — Ambulatory Visit (HOSPITAL_COMMUNITY)
Admission: RE | Admit: 2014-02-02 | Discharge: 2014-02-02 | Disposition: A | Discharge status: Home / Self Care | Payer: Medicaid Other | Source: Ambulatory Visit | Attending: Nurse Practitioner | Admitting: Nurse Practitioner

## 2014-02-02 ENCOUNTER — Other Ambulatory Visit (HOSPITAL_COMMUNITY): Payer: Self-pay | Admitting: Nurse Practitioner

## 2014-02-02 DIAGNOSIS — Z1231 Encounter for screening mammogram for malignant neoplasm of breast: Secondary | ICD-10-CM | POA: Diagnosis present

## 2014-02-02 DIAGNOSIS — N649 Disorder of breast, unspecified: Secondary | ICD-10-CM

## 2014-02-02 DIAGNOSIS — L723 Sebaceous cyst: Secondary | ICD-10-CM | POA: Insufficient documentation

## 2014-02-10 ENCOUNTER — Ambulatory Visit: Payer: Self-pay | Admitting: Family

## 2014-02-14 ENCOUNTER — Ambulatory Visit: Payer: Self-pay | Admitting: Family

## 2014-03-28 ENCOUNTER — Telehealth: Payer: Self-pay | Admitting: Family

## 2014-03-28 NOTE — Telephone Encounter (Signed)
Appt rescheduled

## 2014-03-30 ENCOUNTER — Ambulatory Visit: Payer: Self-pay | Admitting: Family

## 2014-04-05 ENCOUNTER — Ambulatory Visit (INDEPENDENT_AMBULATORY_CARE_PROVIDER_SITE_OTHER): Payer: Medicaid Other | Admitting: Family

## 2014-04-05 ENCOUNTER — Encounter: Payer: Self-pay | Admitting: Family

## 2014-04-05 VITALS — BP 118/79 | HR 95 | Temp 98.2°F | Ht 63.0 in | Wt 222.0 lb

## 2014-04-05 DIAGNOSIS — Z1211 Encounter for screening for malignant neoplasm of colon: Secondary | ICD-10-CM

## 2014-04-05 DIAGNOSIS — Z0000 Encounter for general adult medical examination without abnormal findings: Secondary | ICD-10-CM

## 2014-04-05 DIAGNOSIS — G8929 Other chronic pain: Secondary | ICD-10-CM

## 2014-04-05 DIAGNOSIS — J42 Unspecified chronic bronchitis: Secondary | ICD-10-CM

## 2014-04-05 DIAGNOSIS — J410 Simple chronic bronchitis: Secondary | ICD-10-CM

## 2014-04-05 DIAGNOSIS — L988 Other specified disorders of the skin and subcutaneous tissue: Secondary | ICD-10-CM

## 2014-04-05 DIAGNOSIS — N649 Disorder of breast, unspecified: Secondary | ICD-10-CM

## 2014-04-05 DIAGNOSIS — M549 Dorsalgia, unspecified: Secondary | ICD-10-CM

## 2014-04-05 NOTE — Progress Notes (Signed)
   Subjective:    Patient ID: Tiffany Lynch, female    DOB: [birth year], 53 y.o.   MRN: [id number]  HPI Pt here to establish care. Pt see's Dr. Doralee Albino for pain management and sees [facility] to manage her anxiety. Pt states she has been seeing her psychiatrics for the last 12 years.  Pt denies any SOB or palpations.     Review of Systems  Constitutional: Negative.   HENT: Negative.   Eyes: Negative.   Respiratory: Negative.  Negative for shortness of breath.   Cardiovascular: Negative.  Negative for palpitations.  Gastrointestinal: Negative.   Endocrine: Negative.   Genitourinary: Negative.   Musculoskeletal: Negative.   Neurological: Negative.  Negative for headaches.  Hematological: Negative.   Psychiatric/Behavioral: Negative.   All other systems reviewed and are negative.      Objective:   Physical Exam  Vitals reviewed. Constitutional: She is oriented to person, place, and time. She appears well-developed and well-nourished. No distress.  HENT:  Head: Normocephalic and atraumatic.  Right Ear: External ear normal.  Left Ear: External ear normal.  Nose: Nose normal.  Mouth/Throat: Oropharynx is clear and moist.  Eyes: Pupils are equal, round, and reactive to light.  Neck: Normal range of motion. Neck supple. No thyromegaly present.  Cardiovascular: Normal rate, regular rhythm, normal heart sounds and intact distal pulses.   No murmur heard. Pulmonary/Chest: Effort normal and breath sounds normal. No respiratory distress. She has no wheezes.  Abdominal: Soft. Bowel sounds are normal. She exhibits no distension. There is no tenderness.  Musculoskeletal: Normal range of motion. She exhibits edema (trace amt in ankles). She exhibits no tenderness.  Neurological: She is alert and oriented to person, place, and time. She has normal reflexes. No cranial nerve deficit.  Skin: Skin is warm and dry.  Dark colored lesion under left nipple  Psychiatric:  She has a normal mood and affect. Her behavior is normal. Judgment and thought content normal.    Blood pressure 118/79, pulse 95, temperature 98.2 F (36.8 C), temperature source Oral, height $RemoveBefo'5\' 3"'[id number]$  (1.6 m), weight 222 lb (100.699 kg), last menstrual period 05/06/2012.       Assessment & Plan:  1. Chronic bronchitis, unspecified chronic bronchitis type  2. Chronic back pain  3. Colon cancer screening - Ambulatory referral to Gastroenterology  4. Annual physical exam - CMP14+EGFR - Lipid panel - Vit D  25 hydroxy (rtn osteoporosis monitoring)  5. Lesion of skin of breast - Ambulatory referral to Dermatology   Continue all meds Labs pending Health Maintenance reviewed Smoking cessation discussed Diet and exercise encouraged RTO 1 year  Evelina Dun, FNP

## 2014-04-05 NOTE — Patient Instructions (Addendum)
Health Maintenance Adopting a healthy lifestyle and getting preventive care can go a long way to promote health and wellness. Talk with your health care provider about what schedule of regular examinations is right for you. This is a good chance for you to check in with your provider about disease prevention and staying healthy. In between checkups, there are plenty of things you can do on your own. Experts have done a lot of research about which lifestyle changes and preventive measures are most likely to keep you healthy. Ask your health care provider for more information. WEIGHT AND DIET  Eat a healthy diet  Be sure to include plenty of vegetables, fruits, low-fat dairy products, and lean protein.  Do not eat a lot of foods high in solid fats, added sugars, or salt.  Get regular exercise. This is one of the most important things you can do for your health.  Most adults should exercise for at least 150 minutes each week. The exercise should increase your heart rate and make you sweat (moderate-intensity exercise).  Most adults should also do strengthening exercises at least twice a week. This is in addition to the moderate-intensity exercise.  Maintain a healthy weight  Body mass index (BMI) is a measurement that can be used to identify possible weight problems. It estimates body fat based on height and weight. Your health care provider can help determine your BMI and help you achieve or maintain a healthy weight.  For females 25 years of age and older:   A BMI below 18.5 is considered underweight.  A BMI of 18.5 to 24.9 is normal.  A BMI of 25 to 29.9 is considered overweight.  A BMI of 30 and above is considered obese.  Watch levels of cholesterol and blood lipids  You should start having your blood tested for lipids and cholesterol at 53 years of age, then have this test every 5 years.  You may need to have your cholesterol levels checked more often if:  Your lipid or  cholesterol levels are high.  You are older than 53 years of age.  You are at high risk for heart disease.  CANCER SCREENING   Lung Cancer  Lung cancer screening is recommended for adults 53-92 years old who are at high risk for lung cancer because of a history of smoking.  A yearly low-dose CT scan of the lungs is recommended for people who:  Currently smoke.  Have quit within the past 15 years.  Have at least a 30-pack-year history of smoking. A pack year is smoking an average of one pack of cigarettes a day for 1 year.  Yearly screening should continue until it has been 15 years since you quit.  Yearly screening should stop if you develop a health problem that would prevent you from having lung cancer treatment.  Breast Cancer  Practice breast self-awareness. This means understanding how your breasts normally appear and feel.  It also means doing regular breast self-exams. Let your health care provider know about any changes, no matter how small.  If you are in your 50s or 30s, you should have a clinical breast exam (CBE) by a health care provider every 1-3 years as part of a regular health exam.  If you are 53 or older, have a CBE every year. Also consider having a breast X-ray (mammogram) every year.  If you have a family history of breast cancer, talk to your health care provider about genetic screening.  If you are  at high risk for breast cancer, talk to your health care provider about having an MRI and a mammogram every year.  Breast cancer gene (BRCA) assessment is recommended for women who have family members with BRCA-related cancers. BRCA-related cancers include:  Breast.  Ovarian.  Tubal.  Peritoneal cancers.  Results of the assessment will determine the need for genetic counseling and BRCA1 and BRCA2 testing. Cervical Cancer Routine pelvic examinations to screen for cervical cancer are no longer recommended for nonpregnant women who are considered low  risk for cancer of the pelvic organs (ovaries, uterus, and vagina) and who do not have symptoms. A pelvic examination may be necessary if you have symptoms including those associated with pelvic infections. Ask your health care provider if a screening pelvic exam is right for you.   The Pap test is the screening test for cervical cancer for women who are considered at risk.  If you had a hysterectomy for a problem that was not cancer or a condition that could lead to cancer, then you no longer need Pap tests.  If you are older than 65 years, and you have had normal Pap tests for the past 10 years, you no longer need to have Pap tests.  If you have had past treatment for cervical cancer or a condition that could lead to cancer, you need Pap tests and screening for cancer for at least 20 years after your treatment.  If you no longer get a Pap test, assess your risk factors if they change (such as having a new sexual partner). This can affect whether you should start being screened again.  Some women have medical problems that increase their chance of getting cervical cancer. If this is the case for you, your health care provider may recommend more frequent screening and Pap tests.  The human papillomavirus (HPV) test is another test that may be used for cervical cancer screening. The HPV test looks for the virus that can cause cell changes in the cervix. The cells collected during the Pap test can be tested for HPV.  The HPV test can be used to screen women 53 years of age and older. Getting tested for HPV can extend the interval between normal Pap tests from three to five years.  An HPV test also should be used to screen women of any age who have unclear Pap test results.  After 53 years of age, women should have HPV testing as often as Pap tests.  Colorectal Cancer  This type of cancer can be detected and often prevented.  Routine colorectal cancer screening usually begins at 53 years of  age and continues through 53 years of age.  Your health care provider may recommend screening at an earlier age if you have risk factors for colon cancer.  Your health care provider may also recommend using home test kits to check for hidden blood in the stool.  A small camera at the end of a tube can be used to examine your colon directly (sigmoidoscopy or colonoscopy). This is done to check for the earliest forms of colorectal cancer.  Routine screening usually begins at age 50.  Direct examination of the colon should be repeated every 5-10 years through 53 years of age. However, you may need to be screened more often if early forms of precancerous polyps or small growths are found. Skin Cancer  Check your skin from head to toe regularly.  Tell your health care provider about any new moles or changes in   moles, especially if there is a change in a mole's shape or color.  Also tell your health care provider if you have a mole that is larger than the size of a pencil eraser.  Always use sunscreen. Apply sunscreen liberally and repeatedly throughout the day.  Protect yourself by wearing long sleeves, pants, a wide-brimmed hat, and sunglasses whenever you are outside. HEART DISEASE, DIABETES, AND HIGH BLOOD PRESSURE   Have your blood pressure checked at least every 1-2 years. High blood pressure causes heart disease and increases the risk of stroke.  If you are between 75 years and 42 years old, ask your health care provider if you should take aspirin to prevent strokes.  Have regular diabetes screenings. This involves taking a blood sample to check your fasting blood sugar level.  If you are at a normal weight and have a low risk for diabetes, have this test once every three years after 53 years of age.  If you are overweight and have a high risk for diabetes, consider being tested at a younger age or more often. PREVENTING INFECTION  Hepatitis B  If you have a higher risk for  hepatitis B, you should be screened for this virus. You are considered at high risk for hepatitis B if:  You were born in a country where hepatitis B is common. Ask your health care provider which countries are considered high risk.  Your parents were born in a high-risk country, and you have not been immunized against hepatitis B (hepatitis B vaccine).  You have HIV or AIDS.  You use needles to inject street drugs.  You live with someone who has hepatitis B.  You have had sex with someone who has hepatitis B.  You get hemodialysis treatment.  You take certain medicines for conditions, including cancer, organ transplantation, and autoimmune conditions. Hepatitis C  Blood testing is recommended for:  Everyone born from [birth year] through 1965.  Anyone with known risk factors for hepatitis C. Sexually transmitted infections (STIs)  You should be screened for sexually transmitted infections (STIs) including gonorrhea and chlamydia if:  You are sexually active and are younger than 53 years of age.  You are older than 53 years of age and your health care provider tells you that you are at risk for this type of infection.  Your sexual activity has changed since you were last screened and you are at an increased risk for chlamydia or gonorrhea. Ask your health care provider if you are at risk.  If you do not have HIV, but are at risk, it may be recommended that you take a prescription medicine daily to prevent HIV infection. This is called pre-exposure prophylaxis (PrEP). You are considered at risk if:  You are sexually active and do not regularly use condoms or know the HIV status of your partner(s).  You take drugs by injection.  You are sexually active with a partner who has HIV. Talk with your health care provider about whether you are at high risk of being infected with HIV. If you choose to begin PrEP, you should first be tested for HIV. You should then be tested every 3 months for  as long as you are taking PrEP.  PREGNANCY   If you are premenopausal and you may become pregnant, ask your health care provider about preconception counseling.  If you may become pregnant, take 400 to 800 micrograms (mcg) of folic acid every day.  If you want to prevent pregnancy, talk to your  health care provider about birth control (contraception). OSTEOPOROSIS AND MENOPAUSE   Osteoporosis is a disease in which the bones lose minerals and strength with aging. This can result in serious bone fractures. Your risk for osteoporosis can be identified using a bone density scan.  If you are 65 years of age or older, or if you are at risk for osteoporosis and fractures, ask your health care provider if you should be screened.  Ask your health care provider whether you should take a calcium or vitamin D supplement to lower your risk for osteoporosis.  Menopause may have certain physical symptoms and risks.  Hormone replacement therapy may reduce some of these symptoms and risks. Talk to your health care provider about whether hormone replacement therapy is right for you.  HOME CARE INSTRUCTIONS   Schedule regular health, dental, and eye exams.  Stay current with your immunizations.   Do not use any tobacco products including cigarettes, chewing tobacco, or electronic cigarettes.  If you are pregnant, do not drink alcohol.  If you are breastfeeding, limit how much and how often you drink alcohol.  Limit alcohol intake to no more than 1 drink per day for nonpregnant women. One drink equals 12 ounces of beer, 5 ounces of wine, or 1 ounces of hard liquor.  Do not use street drugs.  Do not share needles.  Ask your health care provider for help if you need support or information about quitting drugs.  Tell your health care provider if you often feel depressed.  Tell your health care provider if you have ever been abused or do not feel safe at home. Document Released: 03/11/2011  Document Revised: 01/10/2014 Document Reviewed: 07/28/2013 [employer] Patient Information 2015 [employer]. This information is not intended to replace advice given to you by your health care provider. Make sure you discuss any questions you have with your health care provider. Smoking Cessation Quitting smoking is important to your health and has many advantages. However, it is not always easy to quit since nicotine is a very addictive drug. Oftentimes, people try 3 times or more before being able to quit. This document explains the best ways for you to prepare to quit smoking. Quitting takes hard work and a lot of effort, but you can do it. ADVANTAGES OF QUITTING SMOKING  You will live longer, feel better, and live better.  Your body will feel the impact of quitting smoking almost immediately.  Within 20 minutes, blood pressure decreases. Your pulse returns to its normal level.  After 8 hours, carbon monoxide levels in the blood return to normal. Your oxygen level increases.  After 24 hours, the chance of having a heart attack starts to decrease. Your breath, hair, and body stop smelling like smoke.  After 48 hours, damaged nerve endings begin to recover. Your sense of taste and smell improve.  After 72 hours, the body is virtually free of nicotine. Your bronchial tubes relax and breathing becomes easier.  After 2 to 12 weeks, lungs can hold more air. Exercise becomes easier and circulation improves.  The risk of having a heart attack, stroke, cancer, or lung disease is greatly reduced.  After 1 year, the risk of coronary heart disease is cut in half.  After 5 years, the risk of stroke falls to the same as a nonsmoker.  After 10 years, the risk of lung cancer is cut in half and the risk of other cancers decreases significantly.  After 15 years, the risk of coronary heart   disease drops, usually to the level of a nonsmoker.  If you are pregnant, quitting smoking will improve your  chances of having a healthy baby.  The people you live with, especially any children, will be healthier.  You will have extra money to spend on things other than cigarettes. QUESTIONS TO THINK ABOUT BEFORE ATTEMPTING TO QUIT You may want to talk about your answers with your health care provider.  Why do you want to quit?  If you tried to quit in the past, what helped and what did not?  What will be the most difficult situations for you after you quit? How will you plan to handle them?  Who can help you through the tough times? Your family? Friends? A health care provider?  What pleasures do you get from smoking? What ways can you still get pleasure if you quit? Here are some questions to ask your health care provider:  How can you help me to be successful at quitting?  What medicine do you think would be best for me and how should I take it?  What should I do if I need more help?  What is smoking withdrawal like? How can I get information on withdrawal? GET READY  Set a quit date.  Change your environment by getting rid of all cigarettes, ashtrays, matches, and lighters in your home, car, or work. Do not let people smoke in your home.  Review your past attempts to quit. Think about what worked and what did not. GET SUPPORT AND ENCOURAGEMENT You have a better chance of being successful if you have help. You can get support in many ways.  Tell your family, friends, and coworkers that you are going to quit and need their support. Ask them not to smoke around you.  Get individual, group, or telephone counseling and support. Programs are available at local hospitals and health centers. Call your local health department for information about programs in your area.  Spiritual beliefs and practices may help some smokers quit.  Download a "quit meter" on your computer to keep track of quit statistics, such as how long you have gone without smoking, cigarettes not smoked, and money  saved.  Get a self-help book about quitting smoking and staying off tobacco. LEARN NEW SKILLS AND BEHAVIORS  Distract yourself from urges to smoke. Talk to someone, go for a walk, or occupy your time with a task.  Change your normal routine. Take a different route to work. Drink tea instead of coffee. Eat breakfast in a different place.  Reduce your stress. Take a hot bath, exercise, or read a book.  Plan something enjoyable to do every day. Reward yourself for not smoking.  Explore interactive web-based programs that specialize in helping you quit. GET MEDICINE AND USE IT CORRECTLY Medicines can help you stop smoking and decrease the urge to smoke. Combining medicine with the above behavioral methods and support can greatly increase your chances of successfully quitting smoking.  Nicotine replacement therapy helps deliver nicotine to your body without the negative effects and risks of smoking. Nicotine replacement therapy includes nicotine gum, lozenges, inhalers, nasal sprays, and skin patches. Some may be available over-the-counter and others require a prescription.  Antidepressant medicine helps people abstain from smoking, but how this works is unknown. This medicine is available by prescription.  Nicotinic receptor partial agonist medicine simulates the effect of nicotine in your brain. This medicine is available by prescription. Ask your health care provider for advice about which medicines   to use and how to use them based on your health history. Your health care provider will tell you what side effects to look out for if you choose to be on a medicine or therapy. Carefully read the information on the package. Do not use any other product containing nicotine while using a nicotine replacement product.  RELAPSE OR DIFFICULT SITUATIONS Most relapses occur within the first 3 months after quitting. Do not be discouraged if you start smoking again. Remember, most people try several times  before finally quitting. You may have symptoms of withdrawal because your body is used to nicotine. You may crave cigarettes, be irritable, feel very hungry, cough often, get headaches, or have difficulty concentrating. The withdrawal symptoms are only temporary. They are strongest when you first quit, but they will go away within 10-14 days. To reduce the chances of relapse, try to:  Avoid drinking alcohol. Drinking lowers your chances of successfully quitting.  Reduce the amount of caffeine you consume. Once you quit smoking, the amount of caffeine in your body increases and can give you symptoms, such as a rapid heartbeat, sweating, and anxiety.  Avoid smokers because they can make you want to smoke.  Do not let weight gain distract you. Many smokers will gain weight when they quit, usually less than 10 pounds. Eat a healthy diet and stay active. You can always lose the weight gained after you quit.  Find ways to improve your mood other than smoking. FOR MORE INFORMATION  www.smokefree.gov  Document Released: 08/20/2001 Document Revised: 01/10/2014 Document Reviewed: 12/05/2011 [facility] Patient Information 2015 [place], [place]. This information is not intended to replace advice given to you by your health care provider. Make sure you discuss any questions you have with your health care provider. Edema Edema is an abnormal buildup of fluids in your bodytissues. Edema is somewhatdependent on gravity to pull the fluid to the lowest place in your body. That makes the condition more common in the legs and thighs (lower extremities). Painless swelling of the feet and ankles is common and becomes more likely as you get older. It is also common in looser tissues, like around your eyes.  When the affected area is squeezed, the fluid may move out of that spot and leave a dent for a few moments. This dent is called pitting.  CAUSES  There are many possible causes of edema. Eating too much salt and  being on your feet or sitting for a long time can cause edema in your legs and ankles. Hot weather may make edema worse. Common medical causes of edema include:  Heart failure.  Liver disease.  Kidney disease.  Weak blood vessels in your legs.  Cancer.  An injury.  Pregnancy.  Some medications.  Obesity. SYMPTOMS  Edema is usually painless.Your skin may look swollen or shiny.  DIAGNOSIS  Your health care provider may be able to diagnose edema by asking about your medical history and doing a physical exam. You may need to have tests such as X-rays, an electrocardiogram, or blood tests to check for medical conditions that may cause edema.  TREATMENT  Edema treatment depends on the cause. If you have heart, liver, or kidney disease, you need the treatment appropriate for these conditions. General treatment may include:  Elevation of the affected body part above the level of your heart.  Compression of the affected body part. Pressure from elastic bandages or support stockings squeezes the tissues and forces fluid back into the blood vessels. This  keeps fluid from entering the tissues.  Restriction of fluid and salt intake.  Use of a water pill (diuretic). These medications are appropriate only for some types of edema. They pull fluid out of your body and make you urinate more often. This gets rid of fluid and reduces swelling, but diuretics can have side effects. Only use diuretics as directed by your health care provider. HOME CARE INSTRUCTIONS   Keep the affected body part above the level of your heart when you are lying down.   Do not sit still or stand for prolonged periods.   Do not put anything directly under your knees when lying down.  Do not wear constricting clothing or garters on your upper legs.   Exercise your legs to work the fluid back into your blood vessels. This may help the swelling go down.   Wear elastic bandages or support stockings to reduce  ankle swelling as directed by your health care provider.   Eat a low-salt diet to reduce fluid if your health care provider recommends it.   Only take medicines as directed by your health care provider. SEEK MEDICAL CARE IF:   Your edema is not responding to treatment.  You have heart, liver, or kidney disease and notice symptoms of edema.  You have edema in your legs that does not improve after elevating them.   You have sudden and unexplained weight gain. SEEK IMMEDIATE MEDICAL CARE IF:   You develop shortness of breath or chest pain.   You cannot breathe when you lie down.  You develop pain, redness, or warmth in the swollen areas.   You have heart, liver, or kidney disease and suddenly get edema.  You have a fever and your symptoms suddenly get worse. MAKE SURE YOU:   Understand these instructions.  Will watch your condition.  Will get help right away if you are not doing well or get worse. Document Released: 08/26/2005 Document Revised: 01/10/2014 Document Reviewed: 06/18/2013 [facility] Patient Information 2015 [place], [place]. This information is not intended to replace advice given to you by your health care provider. Make sure you discuss any questions you have with your health care provider.

## 2014-04-06 ENCOUNTER — Other Ambulatory Visit: Payer: Self-pay | Admitting: Family

## 2014-04-06 LAB — CMP14+EGFR
ALT: 21 IU/L (ref 0–32)
AST: 14 IU/L (ref 0–40)
Albumin/Globulin Ratio: 2 (ref 1.1–2.5)
Albumin: 4.2 g/dL (ref 3.5–5.5)
Alkaline Phosphatase: 107 IU/L (ref 39–117)
BUN/Creatinine Ratio: 10 (ref 9–23)
BUN: 9 mg/dL (ref 6–24)
CO2: 27 mmol/L (ref 18–29)
Calcium: 9.4 mg/dL (ref 8.7–10.2)
Chloride: 97 mmol/L (ref 97–108)
Creatinine, Ser: 0.93 mg/dL (ref 0.57–1.00)
GFR calc Af Amer: 81 mL/min/{1.73_m2} (ref >59)
GFR calc non Af Amer: 70 mL/min/{1.73_m2} (ref >59)
Globulin, Total: 2.1 g/dL (ref 1.5–4.5)
Glucose: 106 mg/dL — ABNORMAL HIGH (ref 65–99)
Potassium: 4.1 mmol/L (ref 3.5–5.2)
Sodium: 140 mmol/L (ref 134–144)
Total Bilirubin: 0.2 mg/dL (ref 0.0–1.2)
Total Protein: 6.3 g/dL (ref 6.0–8.5)

## 2014-04-06 LAB — LIPID PANEL
Chol/HDL Ratio: 4.9 ratio units — ABNORMAL HIGH (ref 0.0–4.4)
Cholesterol, Total: 180 mg/dL (ref 100–199)
HDL: 37 mg/dL — ABNORMAL LOW (ref >39)
LDL Calculated: 111 mg/dL — ABNORMAL HIGH (ref 0–99)
Triglycerides: 158 mg/dL — ABNORMAL HIGH (ref 0–149)
VLDL Cholesterol Cal: 32 mg/dL (ref 5–40)

## 2014-04-06 LAB — VITAMIN D 25 HYDROXY (VIT D DEFICIENCY, FRACTURES): Vit D, 25-Hydroxy: 39 ng/mL (ref 30.0–100.0)

## 2014-04-06 MED ORDER — SIMVASTATIN 20 MG PO TABS: 20.0000 mg | ORAL_TABLET | Freq: Every day | ORAL | Status: DC

## 2014-04-07 ENCOUNTER — Encounter (INDEPENDENT_AMBULATORY_CARE_PROVIDER_SITE_OTHER): Payer: Self-pay | Admitting: *Deleted

## 2014-05-06 ENCOUNTER — Ambulatory Visit (INDEPENDENT_AMBULATORY_CARE_PROVIDER_SITE_OTHER): Payer: Medicaid Other | Admitting: Family Medicine

## 2014-05-06 VITALS — BP 138/88 | HR 87 | Temp 99.0°F | Ht 63.0 in | Wt 221.4 lb

## 2014-05-06 DIAGNOSIS — L259 Unspecified contact dermatitis, unspecified cause: Secondary | ICD-10-CM

## 2014-05-06 MED ORDER — HYDROXYZINE HCL 25 MG PO TABS: 25.0000 mg | ORAL_TABLET | Freq: Three times a day (TID) | ORAL | Status: DC | PRN

## 2014-05-06 MED ORDER — METHYLPREDNISOLONE ACETATE 80 MG/ML IJ SUSP
80.0000 mg | Freq: Once | INTRAMUSCULAR | Status: AC
Start: 2014-05-06 — End: 2014-05-06
  Administered 2014-05-06: 80 mg via INTRAMUSCULAR

## 2014-05-06 MED ORDER — METHYLPREDNISOLONE (PAK) 4 MG PO TABS: ORAL_TABLET | ORAL | Status: DC

## 2014-05-06 NOTE — Progress Notes (Signed)
   Subjective:    Patient ID: Tiffany Lynch, female    DOB: April 07, 1961, 53 y.o.   MRN: [id number]  HPI  This 53 y.o. female presents for evaluation of rash and pruritis.  Review of Systems C/o rash   No chest pain, SOB, HA, dizziness, vision change, N/V, diarrhea, constipation, dysuria, urinary urgency or frequency, myalgias, arthralgias.  Objective:   Physical Exam Vital signs noted  Well developed well nourished female.  HEENT - Head atraumatic Normocephalic                Eyes - PERRLA, Conjuctiva - clear Sclera- Clear EOMI                Ears - EAC's Wnl TM's Wnl Gross Hearing WNL                Throat - oropharanx wnl Respiratory - Lungs CTA bilateral Cardiac - RRR S1 and S2 without murmur Skin - raised erythematous rash on thighs      Assessment & Plan:  Contact dermatitis - Plan: methylPREDNISolone acetate (DEPO-MEDROL) injection 80 mg, methylPREDNIsolone (MEDROL DOSPACK) 4 MG tablet, hydrOXYzine (ATARAX/VISTARIL) 25 MG tablet  Lysbeth Penner FNP

## 2014-05-18 ENCOUNTER — Other Ambulatory Visit: Payer: Self-pay | Admitting: Neurology

## 2014-05-18 ENCOUNTER — Ambulatory Visit (HOSPITAL_COMMUNITY)
Admission: RE | Admit: 2014-05-18 | Discharge: 2014-05-18 | Disposition: A | Discharge status: Home / Self Care | Payer: Medicaid Other | Source: Ambulatory Visit | Attending: Neurology | Admitting: Neurology

## 2014-05-18 DIAGNOSIS — M546 Pain in thoracic spine: Secondary | ICD-10-CM

## 2014-05-18 DIAGNOSIS — M47814 Spondylosis without myelopathy or radiculopathy, thoracic region: Secondary | ICD-10-CM | POA: Insufficient documentation

## 2014-05-20 ENCOUNTER — Encounter: Payer: Self-pay | Admitting: *Deleted

## 2014-05-30 ENCOUNTER — Ambulatory Visit (INDEPENDENT_AMBULATORY_CARE_PROVIDER_SITE_OTHER): Payer: Medicaid Other | Admitting: Family Medicine

## 2014-05-30 ENCOUNTER — Encounter: Payer: Self-pay | Admitting: Family Medicine

## 2014-05-30 VITALS — BP 118/86 | HR 94 | Temp 97.6°F | Ht 63.0 in | Wt 217.0 lb

## 2014-05-30 DIAGNOSIS — K5289 Other specified noninfective gastroenteritis and colitis: Secondary | ICD-10-CM

## 2014-05-30 MED ORDER — ONDANSETRON 8 MG PO TBDP: 8.0000 mg | ORAL_TABLET | Freq: Three times a day (TID) | ORAL | Status: DC | PRN

## 2014-05-30 NOTE — Progress Notes (Signed)
   Subjective:    Patient ID: Tiffany Lynch, female    DOB: 25-Nov-1960, 53 y.o.   MRN: [id number]  HPI This 53 y.o. female presents for evaluation of c/o nausea, vomiting, diarrhea, and fatigue.   Review of Systems No chest pain, SOB, HA, dizziness, vision change, N/V, diarrhea, constipation, dysuria, urinary urgency or frequency, myalgias, arthralgias or rash.     Objective:   Physical Exam  Vital signs noted  Well developed well nourished female.  HEENT - Head atraumatic Normocephalic                Eyes - PERRLA, Conjuctiva - clear Sclera- Clear EOMI                Ears - EAC's Wnl TM's Wnl Gross Hearing                Throat - oropharanx wnl Respiratory - Lungs CTA bilateral Cardiac - RRR S1 and S2 without murmur GI - Abdomen soft Nontender and bowel sounds active x 4 Extremities - No edema. Neuro - Grossly intact.      Assessment & Plan:  Other and unspecified noninfectious gastroenteritis and colitis(558.9) - Plan: ondansetron (ZOFRAN ODT) 8 MG disintegrating tablet  Lysbeth Penner FNP

## 2014-06-07 ENCOUNTER — Other Ambulatory Visit (INDEPENDENT_AMBULATORY_CARE_PROVIDER_SITE_OTHER): Payer: Self-pay | Admitting: *Deleted

## 2014-06-07 ENCOUNTER — Ambulatory Visit: Payer: Medicaid Other

## 2014-06-07 ENCOUNTER — Telehealth (INDEPENDENT_AMBULATORY_CARE_PROVIDER_SITE_OTHER): Payer: Self-pay | Admitting: *Deleted

## 2014-06-07 ENCOUNTER — Encounter (INDEPENDENT_AMBULATORY_CARE_PROVIDER_SITE_OTHER): Payer: Self-pay | Admitting: *Deleted

## 2014-06-07 DIAGNOSIS — Z1211 Encounter for screening for malignant neoplasm of colon: Secondary | ICD-10-CM

## 2014-06-07 MED ORDER — PEG 3350-KCL-NA BICARB-NACL 420 G PO SOLR: 4000.0000 mL | Freq: Once | ORAL | Status: DC

## 2014-06-07 NOTE — Telephone Encounter (Signed)
Patient needs trilyte 

## 2014-06-29 ENCOUNTER — Telehealth (INDEPENDENT_AMBULATORY_CARE_PROVIDER_SITE_OTHER): Payer: Self-pay | Admitting: *Deleted

## 2014-06-29 NOTE — Telephone Encounter (Signed)
Referring MD/PCP: moore   Procedure: tcs  Reason/Indication:  screening  Has patient had this procedure before?    If so, when, by whom and where?    Is there a family history of colon cancer?  no  Who?  What age when diagnosed?    Is patient diabetic?   no      Does patient have prosthetic heart valve?  no  Do you have a pacemaker?  no  Has patient ever had endocarditis? no  Has patient had joint replacement within last 12 months?  no  Does patient tend to be constipated or take laxatives? no  Is patient on Coumadin, Plavix and/or Aspirin? no  Medications: see epic  Allergies: nkda  Medication Adjustment:   Procedure date & time: 07/18/14 at 730

## 2014-07-01 NOTE — Telephone Encounter (Signed)
agree

## 2014-07-04 ENCOUNTER — Encounter (HOSPITAL_COMMUNITY): Payer: Self-pay | Admitting: Pharmacy Technician

## 2014-07-06 ENCOUNTER — Ambulatory Visit: Payer: Medicaid Other | Admitting: Family Medicine

## 2014-07-11 ENCOUNTER — Encounter: Payer: Self-pay | Admitting: Family Medicine

## 2014-07-12 NOTE — Patient Instructions (Signed)
STASIA SOMERO  07/12/2014   Your procedure is scheduled on:   07/18/2014  Report to [facility] at  39  AM.  Call this number if you have problems the morning of surgery: [phone]   Remember:   Do not eat food or drink liquids after midnight.   Take these medicines the morning of surgery with A SIP OF WATER:  Xanax, cumbalta, hydrocodone, wellbutrin, buspar, neurontin, zofran, ultram   Do not wear jewelry, make-up or nail polish.  Do not wear lotions, powders, or perfumes.   Do not shave 48 hours prior to surgery. Men may shave face and neck.  Do not bring valuables to the hospital.  [facility] is not responsible for any belongings or valuables.               Contacts, dentures or bridgework may not be worn into surgery.  Leave suitcase in the car. After surgery it may be brought to your room.  For patients admitted to the hospital, discharge time is determined by your treatment team.               Patients discharged the day of surgery will not be allowed to drive home.  Name and phone number of your driver: family  Special Instructions: N/A   Please read over the following fact sheets that you were given: Pain Booklet, Coughing and Deep Breathing, Surgical Site Infection Prevention, Anesthesia Post-op Instructions and Care and Recovery After Surgery Colonoscopy A colonoscopy is an exam to look at the entire large intestine (colon). This exam can help find problems such as tumors, polyps, inflammation, and areas of bleeding. The exam takes about 1 hour.  LET [facility] CARE PROVIDER KNOW ABOUT:   Any allergies you have.  All medicines you are taking, including vitamins, herbs, eye drops, creams, and over-the-counter medicines.  Previous problems you or members of your family have had with the use of anesthetics.  Any blood disorders you have.  Previous surgeries you have had.  Medical conditions you have. RISKS AND COMPLICATIONS  Generally, this is a safe  procedure. However, as with any procedure, complications can occur. Possible complications include:  Bleeding.  Tearing or rupture of the colon wall.  Reaction to medicines given during the exam.  Infection (rare). BEFORE THE PROCEDURE   Ask your health care provider about changing or stopping your regular medicines.  You may be prescribed an oral bowel prep. This involves drinking a large amount of medicated liquid, starting the day before your procedure. The liquid will cause you to have multiple loose stools until your stool is almost clear or light green. This cleans out your colon in preparation for the procedure.  Do not eat or drink anything else once you have started the bowel prep, unless your health care provider tells you it is safe to do so.  Arrange for someone to drive you home after the procedure. PROCEDURE   You will be given medicine to help you relax (sedative).  You will lie on your side with your knees bent.  A long, flexible tube with a light and camera on the end (colonoscope) will be inserted through the rectum and into the colon. The camera sends video back to a computer screen as it moves through the colon. The colonoscope also releases carbon dioxide gas to inflate the colon. This helps your health care provider see the area better.  During the exam, your health care provider may  take a small tissue sample (biopsy) to be examined under a microscope if any abnormalities are found.  The exam is finished when the entire colon has been viewed. AFTER THE PROCEDURE   Do not drive for 24 hours after the exam.  You may have a small amount of blood in your stool.  You may pass moderate amounts of gas and have mild abdominal cramping or bloating. This is caused by the gas used to inflate your colon during the exam.  Ask when your test results will be ready and how you will get your results. Make sure you get your test results. Document Released: 08/23/2000  Document Revised: 06/16/2013 Document Reviewed: 05/03/2013 [facility] Patient Information 2015 [place], [place]. This information is not intended to replace advice given to you by your health care provider. Make sure you discuss any questions you have with your health care provider. PATIENT INSTRUCTIONS POST-ANESTHESIA  IMMEDIATELY FOLLOWING SURGERY:  Do not drive or operate machinery for the first twenty four hours after surgery.  Do not make any important decisions for twenty four hours after surgery or while taking narcotic pain medications or sedatives.  If you develop intractable nausea and vomiting or a severe headache please notify your doctor immediately.  FOLLOW-UP:  Please make an appointment with your surgeon as instructed. You do not need to follow up with anesthesia unless specifically instructed to do so.  WOUND CARE INSTRUCTIONS (if applicable):  Keep a dry clean dressing on the anesthesia/puncture wound site if there is drainage.  Once the wound has quit draining you may leave it open to air.  Generally you should leave the bandage intact for twenty four hours unless there is drainage.  If the epidural site drains for more than 36-48 hours please call the anesthesia department.  QUESTIONS?:  Please feel free to call your physician or the hospital operator if you have any questions, and they will be happy to assist you.

## 2014-07-14 ENCOUNTER — Encounter (HOSPITAL_COMMUNITY)
Admission: RE | Admit: 2014-07-14 | Discharge: 2014-07-14 | Disposition: A | Discharge status: Home / Self Care | Payer: Medicaid Other | Source: Ambulatory Visit | Attending: Internal Medicine | Admitting: Internal Medicine

## 2014-07-14 ENCOUNTER — Encounter (HOSPITAL_COMMUNITY): Payer: Self-pay

## 2014-07-14 VITALS — BP 130/86 | HR 86 | Temp 97.9°F | Resp 18 | Ht 63.0 in | Wt 219.8 lb

## 2014-07-14 DIAGNOSIS — Z01812 Encounter for preprocedural laboratory examination: Secondary | ICD-10-CM | POA: Insufficient documentation

## 2014-07-14 DIAGNOSIS — Z1211 Encounter for screening for malignant neoplasm of colon: Secondary | ICD-10-CM

## 2014-07-14 LAB — BASIC METABOLIC PANEL
Anion gap: 11 (ref 5–15)
BUN: 11 mg/dL (ref 6–23)
CO2: 30 mEq/L (ref 19–32)
Calcium: 9.7 mg/dL (ref 8.4–10.5)
Chloride: 101 mEq/L (ref 96–112)
Creatinine, Ser: 1.01 mg/dL (ref 0.50–1.10)
GFR calc Af Amer: 72 mL/min — ABNORMAL LOW (ref >90)
GFR calc non Af Amer: 62 mL/min — ABNORMAL LOW (ref >90)
Glucose, Bld: 98 mg/dL (ref 70–99)
Potassium: 4.3 mEq/L (ref 3.7–5.3)
Sodium: 142 mEq/L (ref 137–147)

## 2014-07-14 LAB — HEMOGLOBIN AND HEMATOCRIT, BLOOD
HCT: 44 % (ref 36.0–46.0)
Hemoglobin: 14.6 g/dL (ref 12.0–15.0)

## 2014-07-14 NOTE — Pre-Procedure Instructions (Signed)
Pt. Given info for MyChart. To be set up at home. 

## 2014-07-18 ENCOUNTER — Encounter (HOSPITAL_COMMUNITY): Payer: Self-pay

## 2014-07-18 ENCOUNTER — Ambulatory Visit (HOSPITAL_COMMUNITY): Payer: Medicaid Other | Admitting: Anesthesiology

## 2014-07-18 ENCOUNTER — Ambulatory Visit (HOSPITAL_COMMUNITY)
Admission: RE | Admit: 2014-07-18 | Discharge: 2014-07-18 | Disposition: A | Discharge status: Home / Self Care | Payer: Medicaid Other | Source: Ambulatory Visit | Attending: Internal Medicine | Admitting: Internal Medicine

## 2014-07-18 ENCOUNTER — Encounter (HOSPITAL_COMMUNITY)
Admission: RE | Disposition: A | Discharge status: Home / Self Care | Payer: Self-pay | Source: Ambulatory Visit | Attending: Internal Medicine

## 2014-07-18 DIAGNOSIS — D123 Benign neoplasm of transverse colon: Secondary | ICD-10-CM | POA: Diagnosis not present

## 2014-07-18 DIAGNOSIS — D120 Benign neoplasm of cecum: Secondary | ICD-10-CM | POA: Insufficient documentation

## 2014-07-18 DIAGNOSIS — Z1211 Encounter for screening for malignant neoplasm of colon: Secondary | ICD-10-CM

## 2014-07-18 DIAGNOSIS — F319 Bipolar disorder, unspecified: Secondary | ICD-10-CM | POA: Insufficient documentation

## 2014-07-18 DIAGNOSIS — Z79899 Other long term (current) drug therapy: Secondary | ICD-10-CM | POA: Insufficient documentation

## 2014-07-18 DIAGNOSIS — D649 Anemia, unspecified: Secondary | ICD-10-CM | POA: Insufficient documentation

## 2014-07-18 DIAGNOSIS — F418 Other specified anxiety disorders: Secondary | ICD-10-CM | POA: Diagnosis not present

## 2014-07-18 DIAGNOSIS — G4730 Sleep apnea, unspecified: Secondary | ICD-10-CM | POA: Insufficient documentation

## 2014-07-18 DIAGNOSIS — D125 Benign neoplasm of sigmoid colon: Secondary | ICD-10-CM | POA: Insufficient documentation

## 2014-07-18 DIAGNOSIS — I10 Essential (primary) hypertension: Secondary | ICD-10-CM | POA: Insufficient documentation

## 2014-07-18 DIAGNOSIS — F17210 Nicotine dependence, cigarettes, uncomplicated: Secondary | ICD-10-CM | POA: Diagnosis not present

## 2014-07-18 DIAGNOSIS — K219 Gastro-esophageal reflux disease without esophagitis: Secondary | ICD-10-CM | POA: Insufficient documentation

## 2014-07-18 HISTORY — PX: POLYPECTOMY: SHX5525

## 2014-07-18 HISTORY — PX: COLONOSCOPY WITH PROPOFOL: SHX5780

## 2014-07-18 SURGERY — COLONOSCOPY WITH PROPOFOL
Anesthesia: Monitor Anesthesia Care

## 2014-07-18 MED ORDER — PROPOFOL 10 MG/ML IV EMUL
INTRAVENOUS | Status: AC
  Filled 2014-07-18: qty 20

## 2014-07-18 MED ORDER — ONDANSETRON HCL 4 MG/2ML IJ SOLN: 4.0000 mg | Freq: Once | INTRAMUSCULAR | Status: DC | PRN

## 2014-07-18 MED ORDER — ONDANSETRON HCL 4 MG/2ML IJ SOLN
INTRAMUSCULAR | Status: AC
  Filled 2014-07-18: qty 2

## 2014-07-18 MED ORDER — STERILE WATER FOR IRRIGATION IR SOLN
Status: DC | PRN
  Administered 2014-07-18: 08:00:00

## 2014-07-18 MED ORDER — LIDOCAINE HCL (PF) 1 % IJ SOLN
INTRAMUSCULAR | Status: AC
  Filled 2014-07-18: qty 5

## 2014-07-18 MED ORDER — FENTANYL CITRATE 0.05 MG/ML IJ SOLN
25.0000 ug | INTRAMUSCULAR | Status: AC
  Administered 2014-07-18 (×2): 25 ug via INTRAVENOUS

## 2014-07-18 MED ORDER — FENTANYL CITRATE 0.05 MG/ML IJ SOLN: 25.0000 ug | INTRAMUSCULAR | Status: DC | PRN

## 2014-07-18 MED ORDER — PROPOFOL INFUSION 10 MG/ML OPTIME
INTRAVENOUS | Status: DC | PRN
  Administered 2014-07-18: 85 ug/kg/min via INTRAVENOUS
  Administered 2014-07-18: 150 ug/kg/min via INTRAVENOUS

## 2014-07-18 MED ORDER — GLYCOPYRROLATE 0.2 MG/ML IJ SOLN
INTRAMUSCULAR | Status: AC
  Filled 2014-07-18: qty 1

## 2014-07-18 MED ORDER — MIDAZOLAM HCL 2 MG/2ML IJ SOLN
1.0000 mg | INTRAMUSCULAR | Status: DC | PRN
  Administered 2014-07-18: 2 mg via INTRAVENOUS

## 2014-07-18 MED ORDER — FENTANYL CITRATE 0.05 MG/ML IJ SOLN
INTRAMUSCULAR | Status: AC
  Filled 2014-07-18: qty 2

## 2014-07-18 MED ORDER — LIDOCAINE HCL (CARDIAC) 10 MG/ML IV SOLN
INTRAVENOUS | Status: DC | PRN
  Administered 2014-07-18: 20 mg via INTRAVENOUS
  Administered 2014-07-18: 10 mg via INTRAVENOUS

## 2014-07-18 MED ORDER — GLYCOPYRROLATE 0.2 MG/ML IJ SOLN
0.2000 mg | Freq: Once | INTRAMUSCULAR | Status: AC
  Administered 2014-07-18: 0.2 mg via INTRAVENOUS

## 2014-07-18 MED ORDER — MIDAZOLAM HCL 2 MG/2ML IJ SOLN
INTRAMUSCULAR | Status: AC
  Filled 2014-07-18: qty 2

## 2014-07-18 MED ORDER — LACTATED RINGERS IV SOLN
INTRAVENOUS | Status: DC
  Administered 2014-07-18: 07:00:00 via INTRAVENOUS

## 2014-07-18 SURGICAL SUPPLY — 24 items
ELECT REM PT RETURN 9FT ADLT (ELECTROSURGICAL)
ELECTRODE REM PT RTRN 9FT ADLT (ELECTROSURGICAL) IMPLANT
FCP BXJMBJMB 240X2.8X (CUTTING FORCEPS)
FLOOR PAD 36X40 (MISCELLANEOUS) ×2
FORCEPS BIOP RAD 4 LRG CAP 4 (CUTTING FORCEPS) ×1 IMPLANT
FORCEPS BIOP RJ4 240 W/NDL (CUTTING FORCEPS)
FORCEPS BXJMBJMB 240X2.8X (CUTTING FORCEPS) IMPLANT
FORMALIN 10 PREFIL 20ML (MISCELLANEOUS) ×1 IMPLANT
INJECTOR/SNARE I SNARE (MISCELLANEOUS) IMPLANT
KIT CLEAN ENDO COMPLIANCE (KITS) ×2 IMPLANT
LUBRICANT JELLY 4.5OZ STERILE (MISCELLANEOUS) ×1 IMPLANT
MANIFOLD NEPTUNE II (INSTRUMENTS) ×1 IMPLANT
NDL SCLEROTHERAPY 25GX240 (NEEDLE) IMPLANT
NEEDLE SCLEROTHERAPY 25GX240 (NEEDLE) IMPLANT
PAD FLOOR 36X40 (MISCELLANEOUS) IMPLANT
PROBE APC STR FIRE (PROBE) IMPLANT
PROBE INJECTION GOLD (MISCELLANEOUS)
PROBE INJECTION GOLD 7FR (MISCELLANEOUS) IMPLANT
SNARE ROTATE MED OVAL 20MM (MISCELLANEOUS) ×1 IMPLANT
SNARE SHORT THROW 13M SML OVAL (MISCELLANEOUS) ×2 IMPLANT
SYR 50ML LL SCALE MARK (SYRINGE) ×1 IMPLANT
TRAP SPECIMEN MUCOUS 40CC (MISCELLANEOUS) IMPLANT
TUBING IRRIGATION ENDOGATOR (MISCELLANEOUS) ×1 IMPLANT
WATER STERILE IRR 1000ML POUR (IV SOLUTION) ×1 IMPLANT

## 2014-07-18 NOTE — Progress Notes (Signed)
Patient drank all of movey prep and took 2 laxatives patient states her bowels are running clear

## 2014-07-18 NOTE — Anesthesia Procedure Notes (Signed)
Procedure Name: MAC Date/Time: 07/18/2014 7:30 AM Performed by: Vista Deck Pre-anesthesia Checklist: Patient identified, Emergency Drugs available, Suction available, Timeout performed and Patient being monitored Patient Re-evaluated:Patient Re-evaluated prior to inductionOxygen Delivery Method: Non-rebreather mask

## 2014-07-18 NOTE — Op Note (Signed)
COLONOSCOPY PROCEDURE REPORT  PATIENT:  Tiffany Lynch  MR#:  [id number] Birthdate:  October 30, 1960, 53 y.o., female Endoscopist:  Dr. Rogene Houston, MD Referred By:  Dr. Redge Gainer, MD Procedure Date: 07/18/2014  Procedure:   Colonoscopy  Indications:  Patient is 53 year old Caucasian female who is undergoing average risk screening colonoscopy.  Informed Consent:  The procedure and risks were reviewed with the patient and informed consent was obtained.  Medications:  Monitored anesthesia care. Please see anesthesia records for details.  Description of procedure:  After a digital rectal exam was performed, that colonoscope was advanced from the anus through the rectum and colon to the area of the cecum, ileocecal valve and appendiceal orifice. The cecum was deeply intubated. These structures were well-seen and photographed for the record. From the level of the cecum and ileocecal valve, the scope was slowly and cautiously withdrawn. The mucosal surfaces were carefully surveyed utilizing scope tip to flexion to facilitate fold flattening as needed. The scope was pulled down into the rectum where a thorough exam including retroflexion was performed.  Findings:   Prep fair with pools of thick liquid and solid stool which could not be suctioned through the scope. Small cecal polyp ablated via cold biopsy. Was located across and ileocecal valve. 2 small polyps were cold snared and submitted together. These were located at transverse and sigmoid colon. 10 mm sessile polyp noted at proximal transverse colon. It was partly hot snared and partly coagulated. Polypectomy was complete. There was another polyp in descending colon which could not be found on the way out. Normal rectal mucosa. Small hemorrhoids below the dentate line.  Therapeutic/Diagnostic Maneuvers Performed:  See above  Complications:  none  Cecal Withdrawal Time:  20 minutes  Impression:  Examination performed to cecum. Fair  prep limiting quality of exam. Small cecal polyp ablated by cold biopsy. Two small polyps are cold snared and submitted together( transverse and distal sigmoid colon). 10 mm sessile polyp at proximal sigmoid colon. It was partly hot snared and partly coagulated. Polypectomy was complete. Another small polyp in descending colon could not be found on the way out because of prep.  Recommendations:  Standard instructions given. I will contact patient with biopsy results and further recommendations.  Alis Sawchuk U  07/18/2014 8:23 AM  CC: Dr. Redge Gainer, MD & Dr. Rayne Du ref. provider found

## 2014-07-18 NOTE — Anesthesia Postprocedure Evaluation (Signed)
  Anesthesia Post-op Note  Patient: Tiffany Lynch  Procedure(s) Performed: Procedure(s) with comments: COLONOSCOPY WITH PROPOFOL (N/A) - in cecum at 0753, withdrawal time 20 min POLYPECTOMY (N/A)  Patient Location: PACU  Anesthesia Type:MAC  Level of Consciousness: awake and patient cooperative  Airway and Oxygen Therapy: Patient Spontanous Breathing  Post-op Pain: mild  Post-op Assessment: Post-op Vital signs reviewed, Patient's Cardiovascular Status Stable, Respiratory Function Stable, Patent Airway and Pain level controlled  Post-op Vital Signs: Reviewed and stable  Last Vitals:  Filed Vitals:   07/18/14 0841  BP: 143/94  Pulse: 87  Temp: 36.8 C  Resp:     Complications: No apparent anesthesia complications

## 2014-07-18 NOTE — H&P (Signed)
Tiffany Lynch is an 53 y.o. female.   Chief Complaint:  Patient is here for colonoscopy. HPI: Patient is 53 year old Caucasian female with multiple medical problems was in for screening exam. She denies abdominal pain or rectal bleeding. She has had intermittent constipation. She has good appetite and her weight has been stable. Family history is negative for CRC.  Past Medical History  Diagnosis Date  . Chronic back pain   . Bipolar 1 disorder   . Anxiety   . Depression   . Hypertension   . Sleep apnea   . On home oxygen therapy     3LPM PRN  . History of anemia   . GERD (gastroesophageal reflux disease)     Past Surgical History  Procedure Laterality Date  . Dilation and curettage of uterus  age 89  . Polypectomy  05/22/2012    Procedure: POLYPECTOMY;  Surgeon: Jonnie Kind, MD;  Location: [facility];  Service: Gynecology;  Laterality: N/A;  Endometrial Polypectomy  . Tubal ligation      Family History  Problem Relation Age of Onset  . Hypertension Mother   . Hypertension Brother   . Anxiety disorder Brother   . Depression Brother    Social History:  reports that she has been smoking Cigarettes.  She has a 35 pack-year smoking history. She has never used smokeless tobacco. She reports that she drinks alcohol. She reports that she uses illicit drugs (Marijuana).  Allergies: No Known Allergies  Medications Prior to Admission  Medication Sig Dispense Refill  . ALPRAZolam (XANAX) 1 MG tablet Take 1 mg by mouth 4 (four) times daily.    Marland Kitchen buPROPion (WELLBUTRIN XL) 300 MG 24 hr tablet Take 300 mg by mouth every morning.    . busPIRone (BUSPAR) 15 MG tablet Take 15 mg by mouth 3 (three) times daily.    . cholecalciferol (VITAMIN D) 400 UNITS TABS tablet Take 400 Units by mouth every morning.    . DULoxetine (CYMBALTA) 60 MG capsule Take 60 mg by mouth every morning.     . gabapentin (NEURONTIN) 600 MG tablet Take 600 mg by mouth 3 (three) times daily.    Marland Kitchen  HYDROcodone-acetaminophen (NORCO/VICODIN) 5-325 MG per tablet Take 1 tablet by mouth every 4 (four) hours as needed for moderate pain (started on 12/17/2013 #15).    . Multiple Vitamin (MULTIVITAMIN WITH MINERALS) TABS tablet Take 1 tablet by mouth every morning.    . ondansetron (ZOFRAN ODT) 8 MG disintegrating tablet Take 1 tablet (8 mg total) by mouth every 8 (eight) hours as needed for nausea or vomiting. 20 tablet 1  . polyethylene glycol-electrolytes (TRILYTE) 420 G solution Take 4,000 mLs by mouth once. 4000 mL 0  . risperiDONE (RISPERDAL) 2 MG tablet Take 1 tablet (2 mg total) by mouth at bedtime.    . simvastatin (ZOCOR) 20 MG tablet Take 1 tablet (20 mg total) by mouth at bedtime. 90 tablet 3  . solifenacin (VESICARE) 10 MG tablet Take 10 mg by mouth daily.    . traMADol (ULTRAM) 50 MG tablet Take 50-100 mg by mouth every 6 (six) hours as needed for moderate pain.    . traZODone (DESYREL) 100 MG tablet Take 300 mg by mouth at bedtime.      No results found for this or any previous visit (from the past 48 hour(s)). No results found.  ROS  Pulse 85, temperature 98.1 F (36.7 C), temperature source Oral, resp. rate 16, last menstrual period 05/06/2012, SpO2  83 %. Physical Exam  Constitutional: She appears well-developed and well-nourished.  HENT:  Mouth/Throat: Oropharynx is clear and moist.  Eyes: Conjunctivae are normal. No scleral icterus.  Neck: No thyromegaly present.  Cardiovascular: Normal rate, regular rhythm and normal heart sounds.   No murmur heard. Respiratory: Effort normal and breath sounds normal.  GI:  Abdomen is obese but soft and nontender without organomegaly or masses.  Musculoskeletal: She exhibits no edema.  Lymphadenopathy:    She has no cervical adenopathy.  Neurological: She is alert.  Skin: Skin is warm and dry.     Assessment/Plan Average risk screening colonoscopy. Patient is on multiple medications and will undergo examination with  propofol.  REHMAN,NAJEEB U 07/18/2014, 7:23 AM

## 2014-07-18 NOTE — Discharge Instructions (Signed)
No aspirin or NSAIDs for 1 week. °Resume usual medications and diet. °No driving for 24 hours. °Physician will call with biopsy results. ° °Colonoscopy, Care After °Refer to this sheet in the next few weeks. These instructions provide you with information on caring for yourself after your procedure. Your health care provider may also give you more specific instructions. Your treatment has been planned according to current medical practices, but problems sometimes occur. Call your health care provider if you have any problems or questions after your procedure. °WHAT TO EXPECT AFTER THE PROCEDURE  °After your procedure, it is typical to have the following: °· A small amount of blood in your stool. °· Moderate amounts of gas and mild abdominal cramping or bloating. °HOME CARE INSTRUCTIONS °· Do not drive, operate machinery, or sign important documents for 24 hours. °· You may shower and resume your regular physical activities, but move at a slower pace for the first 24 hours. °· Take frequent rest periods for the first 24 hours. °· Walk around or put a warm pack on your abdomen to help reduce abdominal cramping and bloating. °· Drink enough fluids to keep your urine clear or pale yellow. °· You may resume your normal diet as instructed by your health care provider. Avoid heavy or fried foods that are hard to digest. °· Avoid drinking alcohol for 24 hours or as instructed by your health care provider. °· Only take over-the-counter or prescription medicines as directed by your health care provider. °· If a tissue sample (biopsy) was taken during your procedure: °¨ Do not take aspirin or blood thinners for 7 days, or as instructed by your health care provider. °¨ Do not drink alcohol for 7 days, or as instructed by your health care provider. °¨ Eat soft foods for the first 24 hours. °SEEK MEDICAL CARE IF: °You have persistent spotting of blood in your stool 2-3 days after the procedure. °SEEK IMMEDIATE MEDICAL CARE  IF: °· You have more than a small spotting of blood in your stool. °· You pass large blood clots in your stool. °· Your abdomen is swollen (distended). °· You have nausea or vomiting. °· You have a fever. °· You have increasing abdominal pain that is not relieved with medicine. °Document Released: 04/09/2004 Document Revised: 06/16/2013 Document Reviewed: 05/03/2013 °[employer]® Patient Information ©2015 [employer]. This information is not intended to replace advice given to you by your health care provider. Make sure you discuss any questions you have with your health care provider. ° ° ° °

## 2014-07-18 NOTE — Transfer of Care (Signed)
Immediate Anesthesia Transfer of Care Note  Patient: Tiffany Lynch  Procedure(s) Performed: Procedure(s) (LRB): COLONOSCOPY WITH PROPOFOL (N/A) POLYPECTOMY (N/A)  Patient Location: PACU  Anesthesia Type: MAC  Level of Consciousness: awake  Airway & Oxygen Therapy: Patient Spontanous Breathing.   Post-op Assessment: Report given to PACU RN, Post -op Vital signs reviewed and stable and Patient moving all extremities  Post vital signs: Reviewed and stable  Complications: No apparent anesthesia complications

## 2014-07-18 NOTE — Anesthesia Preprocedure Evaluation (Signed)
Anesthesia Evaluation  Patient identified by MRN, date of birth, ID band Patient awake    Reviewed: Allergy & Precautions, H&P , NPO status , Patient's Chart, lab work & pertinent test results  Airway Mallampati: III  TM Distance: >3 FB   Mouth opening: Limited Mouth Opening  Dental  (+) Teeth Intact, Partial Lower   Pulmonary sleep apnea , COPD (recent seasonal allergies, hoarsness.)Current Smoker,    + decreased breath sounds      Cardiovascular hypertension, Pt. on medications Rhythm:Regular Rate:Normal     Neuro/Psych PSYCHIATRIC DISORDERS Anxiety Depression Bipolar Disorder    GI/Hepatic GERD-  Medicated and Controlled,  Endo/Other    Renal/GU Renal disease     Musculoskeletal   Abdominal   Peds  Hematology   Anesthesia Other Findings   Reproductive/Obstetrics                             Anesthesia Physical Anesthesia Plan  ASA: III  Anesthesia Plan: MAC   Post-op Pain Management:    Induction: Intravenous  Airway Management Planned: Simple Face Mask  Additional Equipment:   Intra-op Plan:   Post-operative Plan:   Informed Consent: I have reviewed the patients History and Physical, chart, labs and discussed the procedure including the risks, benefits and alternatives for the proposed anesthesia with the patient or authorized representative who has indicated his/her understanding and acceptance.     Plan Discussed with:   Anesthesia Plan Comments:         Anesthesia Quick Evaluation

## 2014-07-19 ENCOUNTER — Encounter (HOSPITAL_COMMUNITY): Payer: Self-pay | Admitting: Internal Medicine

## 2014-08-09 ENCOUNTER — Ambulatory Visit: Payer: Medicaid Other | Admitting: Family Medicine

## 2014-08-11 ENCOUNTER — Ambulatory Visit (INDEPENDENT_AMBULATORY_CARE_PROVIDER_SITE_OTHER): Payer: Medicaid Other | Admitting: Family Medicine

## 2014-08-11 ENCOUNTER — Ambulatory Visit: Payer: Medicaid Other | Admitting: *Deleted

## 2014-08-11 ENCOUNTER — Encounter: Payer: Self-pay | Admitting: Family Medicine

## 2014-08-11 VITALS — BP 155/102 | HR 89 | Temp 97.4°F | Ht 63.0 in | Wt 217.8 lb

## 2014-08-11 DIAGNOSIS — B351 Tinea unguium: Secondary | ICD-10-CM

## 2014-08-11 DIAGNOSIS — E785 Hyperlipidemia, unspecified: Secondary | ICD-10-CM

## 2014-08-11 DIAGNOSIS — I10 Essential (primary) hypertension: Secondary | ICD-10-CM

## 2014-08-11 DIAGNOSIS — M25511 Pain in right shoulder: Secondary | ICD-10-CM

## 2014-08-11 DIAGNOSIS — Z23 Encounter for immunization: Secondary | ICD-10-CM

## 2014-08-11 MED ORDER — EFINACONAZOLE 10 % EX SOLN: 1.0000 "application " | CUTANEOUS | Status: DC

## 2014-08-11 MED ORDER — MELOXICAM 7.5 MG PO TABS: 7.5000 mg | ORAL_TABLET | Freq: Every day | ORAL | Status: DC

## 2014-08-11 MED ORDER — HYDROCHLOROTHIAZIDE 12.5 MG PO CAPS: 12.5000 mg | ORAL_CAPSULE | Freq: Every day | ORAL | Status: DC

## 2014-08-11 MED ORDER — MIRABEGRON ER 25 MG PO TB24: 25.0000 mg | ORAL_TABLET | Freq: Every day | ORAL | Status: DC

## 2014-08-11 NOTE — Progress Notes (Signed)
   Subjective:    Patient ID: Tiffany Lynch, female    DOB: Nov 05, 1960, 53 y.o.   MRN: [id number]  HPI 53 year old female with several medical problems including COPD, bipolar disorder which is manifested mostly as depression. Urge incontinence,. Today she also complains of right shoulder pain. She was seen at urgent care over the weekend and received for Vicodin and a an injection of steroid with minimal improvement. She also complains of some toenail fungus on 2 of her toenails. There is a history of hypotension. She was formally on blood pressure medication but in reviewing her vital signs over the past several visits her blood pressure has been elevated. She also sees several other providers including pain management, psychiatry, and neurology.    Review of Systems  Constitutional: Positive for unexpected weight change.  Genitourinary: Positive for urgency and frequency.  Psychiatric/Behavioral: The patient is nervous/anxious.        Objective:   Physical Exam  Constitutional: She is oriented to person, place, and time. She appears well-developed and well-nourished.  Eyes: Conjunctivae and EOM are normal.  Neck: Normal range of motion. Neck supple.  Cardiovascular: Normal rate, regular rhythm and normal heart sounds.   Pulmonary/Chest: Effort normal and breath sounds normal.  Abdominal: Soft. Bowel sounds are normal.  Musculoskeletal: Normal range of motion.  Right shoulder has tenderness to palpation laterally and decreased internal rotation on range of motion testing  Neurological: She is alert and oriented to person, place, and time. She has normal reflexes.  Skin: Skin is warm and dry.  Discoloration on toenails consistent with fungal infection  Psychiatric: She has a normal mood and affect. Her behavior is normal. Thought content normal.   BP 155/102 mmHg  Pulse 89  Temp(Src) 97.4 F (36.3 C) (Oral)  Ht 5\' 3"  (1.6 m)  Wt 217 lb 12.8 oz (98.793 kg)  BMI 38.59 kg/m2  LMP  05/06/2012       Assessment & Plan:  1. Hyperlipidemia 1. Hyperlipidemia   2. Essential hypertension And HCTZ 12.5 mg and recheck in one month  3. Toenail fungus Rx: Jublia x 48 weeks  4. Pain in joint, shoulder region, right Suspect tendinitis or bursitis encourage simple range of motion and Rx for Mobic 7.5 mg 2 weeks  Wardell Honour MD

## 2014-08-15 ENCOUNTER — Telehealth: Payer: Self-pay | Admitting: *Deleted

## 2014-08-15 NOTE — Telephone Encounter (Signed)
Dr. Sabra Heck, [place] [facility] denied the jublia on Tiffany Lynch, they did not give a reason just denied, in about 2 weeks they will send out a sheet and give some sort of explanation.

## 2014-08-17 ENCOUNTER — Telehealth: Payer: Self-pay | Admitting: *Deleted

## 2014-08-17 NOTE — Telephone Encounter (Signed)
Dr. Sabra Heck I called [place] [facility] and fou d that they will cover either ciclopirox cream, solution, clotrimazole rx, clotrimazole/betamethasone cream, ketoconazole creamor shampoo or nystatin.  Hopefully one of these will work and if not at least we can go back and tell them she has tried something else.  Thanks for your help.

## 2014-08-18 NOTE — Telephone Encounter (Signed)
I would recommend Lamisil 250 mg tablets 1 a day for 4 months

## 2014-08-23 ENCOUNTER — Other Ambulatory Visit: Payer: Self-pay | Admitting: *Deleted

## 2014-08-23 MED ORDER — TERBINAFINE HCL 250 MG PO TABS: 250.0000 mg | ORAL_TABLET | Freq: Every day | ORAL | Status: DC

## 2014-08-23 NOTE — Progress Notes (Signed)
Order sent into pharmacy per Dr Ammie Ferrier recommendation

## 2014-09-07 ENCOUNTER — Telehealth: Payer: Self-pay | Admitting: Family Medicine

## 2014-09-08 MED ORDER — MIRABEGRON ER 25 MG PO TB24: 25.0000 mg | ORAL_TABLET | Freq: Every day | ORAL | Status: DC

## 2014-09-08 NOTE — Telephone Encounter (Signed)
Medicine sent to pharmacy

## 2014-09-12 ENCOUNTER — Telehealth: Payer: Self-pay | Admitting: *Deleted

## 2014-09-12 NOTE — Telephone Encounter (Signed)
Tiffany Lynch tracks will not cover myrbetriq until she has tried and failed at least two of the formulary drugs, they are  1-oxybutynin 2-toviaz, or 3- vesicare, will either of these work? Please order if if you think one will work.Marland Kitchen

## 2014-09-13 NOTE — Telephone Encounter (Signed)
Would call in what is covered

## 2014-09-14 ENCOUNTER — Other Ambulatory Visit: Payer: Self-pay | Admitting: *Deleted

## 2014-09-14 MED ORDER — SOLIFENACIN SUCCINATE 10 MG PO TABS: 10.0000 mg | ORAL_TABLET | Freq: Every day | ORAL | Status: DC

## 2014-09-26 ENCOUNTER — Encounter: Payer: Self-pay | Admitting: *Deleted

## 2014-10-18 ENCOUNTER — Ambulatory Visit: Payer: Medicaid Other | Admitting: Family Medicine

## 2014-10-20 ENCOUNTER — Ambulatory Visit: Payer: Medicaid Other | Admitting: Family Medicine

## 2014-11-08 ENCOUNTER — Ambulatory Visit: Payer: Medicaid Other | Admitting: Family

## 2014-11-22 ENCOUNTER — Ambulatory Visit: Payer: Medicaid Other | Admitting: Family

## 2014-11-22 ENCOUNTER — Telehealth: Payer: Self-pay | Admitting: Family

## 2014-11-22 MED ORDER — TERBINAFINE HCL 250 MG PO TABS: 250.0000 mg | ORAL_TABLET | Freq: Every day | ORAL | Status: DC

## 2014-11-22 MED ORDER — MIRABEGRON ER 25 MG PO TB24: 25.0000 mg | ORAL_TABLET | Freq: Every day | ORAL | Status: DC

## 2014-11-22 MED ORDER — HYDROCHLOROTHIAZIDE 12.5 MG PO CAPS: 12.5000 mg | ORAL_CAPSULE | Freq: Every day | ORAL | Status: DC

## 2014-11-22 NOTE — Telephone Encounter (Signed)
done

## 2014-12-14 ENCOUNTER — Ambulatory Visit: Payer: Medicaid Other | Admitting: Family

## 2015-01-02 ENCOUNTER — Encounter: Payer: Self-pay | Admitting: Family

## 2015-01-02 ENCOUNTER — Ambulatory Visit (INDEPENDENT_AMBULATORY_CARE_PROVIDER_SITE_OTHER): Payer: Medicaid Other | Admitting: Family

## 2015-01-02 VITALS — BP 112/73 | HR 89 | Temp 98.4°F | Ht 63.0 in | Wt 216.8 lb

## 2015-01-02 DIAGNOSIS — K219 Gastro-esophageal reflux disease without esophagitis: Secondary | ICD-10-CM | POA: Insufficient documentation

## 2015-01-02 DIAGNOSIS — R5383 Other fatigue: Secondary | ICD-10-CM

## 2015-01-02 DIAGNOSIS — G4700 Insomnia, unspecified: Secondary | ICD-10-CM | POA: Diagnosis not present

## 2015-01-02 DIAGNOSIS — N3281 Overactive bladder: Secondary | ICD-10-CM | POA: Diagnosis not present

## 2015-01-02 DIAGNOSIS — M549 Dorsalgia, unspecified: Secondary | ICD-10-CM | POA: Diagnosis not present

## 2015-01-02 DIAGNOSIS — E785 Hyperlipidemia, unspecified: Secondary | ICD-10-CM | POA: Insufficient documentation

## 2015-01-02 DIAGNOSIS — F419 Anxiety disorder, unspecified: Secondary | ICD-10-CM

## 2015-01-02 DIAGNOSIS — F329 Major depressive disorder, single episode, unspecified: Secondary | ICD-10-CM | POA: Diagnosis not present

## 2015-01-02 DIAGNOSIS — G8929 Other chronic pain: Secondary | ICD-10-CM

## 2015-01-02 DIAGNOSIS — F32A Depression, unspecified: Secondary | ICD-10-CM

## 2015-01-02 MED ORDER — MELOXICAM 7.5 MG PO TABS: 7.5000 mg | ORAL_TABLET | Freq: Every day | ORAL | Status: DC

## 2015-01-02 MED ORDER — HYDROCHLOROTHIAZIDE 12.5 MG PO CAPS: 12.5000 mg | ORAL_CAPSULE | Freq: Every day | ORAL | Status: DC

## 2015-01-02 MED ORDER — OMEPRAZOLE 20 MG PO CPDR: 20.0000 mg | DELAYED_RELEASE_CAPSULE | Freq: Every day | ORAL | Status: DC

## 2015-01-02 MED ORDER — MIRABEGRON ER 25 MG PO TB24: 25.0000 mg | ORAL_TABLET | Freq: Every day | ORAL | Status: DC

## 2015-01-02 MED ORDER — TERBINAFINE HCL 250 MG PO TABS: 250.0000 mg | ORAL_TABLET | Freq: Every day | ORAL | Status: DC

## 2015-01-02 MED ORDER — SIMVASTATIN 20 MG PO TABS: 20.0000 mg | ORAL_TABLET | Freq: Every day | ORAL | Status: DC

## 2015-01-02 NOTE — Patient Instructions (Addendum)
Health Maintenance Adopting a healthy lifestyle and getting preventive care can go a long way to promote health and wellness. Talk with your health care provider about what schedule of regular examinations is right for you. This is a good chance for you to check in with your provider about disease prevention and staying healthy. In between checkups, there are plenty of things you can do on your own. Experts have done a lot of research about which lifestyle changes and preventive measures are most likely to keep you healthy. Ask your health care provider for more information. WEIGHT AND DIET  Eat a healthy diet  Be sure to include plenty of vegetables, fruits, low-fat dairy products, and lean protein.  Do not eat a lot of foods high in solid fats, added sugars, or salt.  Get regular exercise. This is one of the most important things you can do for your health.  Most adults should exercise for at least 150 minutes each week. The exercise should increase your heart rate and make you sweat (moderate-intensity exercise).  Most adults should also do strengthening exercises at least twice a week. This is in addition to the moderate-intensity exercise.  Maintain a healthy weight  Body mass index (BMI) is a measurement that can be used to identify possible weight problems. It estimates body fat based on height and weight. Your health care provider can help determine your BMI and help you achieve or maintain a healthy weight.  For females 25 years of age and older:   A BMI below 18.5 is considered underweight.  A BMI of 18.5 to 24.9 is normal.  A BMI of 25 to 29.9 is considered overweight.  A BMI of 30 and above is considered obese.  Watch levels of cholesterol and blood lipids  You should start having your blood tested for lipids and cholesterol at 54 years of age, then have this test every 5 years.  You may need to have your cholesterol levels checked more often if:  Your lipid or  cholesterol levels are high.  You are older than 54 years of age.  You are at high risk for heart disease.  CANCER SCREENING   Lung Cancer  Lung cancer screening is recommended for adults 97-92 years old who are at high risk for lung cancer because of a history of smoking.  A yearly low-dose CT scan of the lungs is recommended for people who:  Currently smoke.  Have quit within the past 15 years.  Have at least a 30-pack-year history of smoking. A pack year is smoking an average of one pack of cigarettes a day for 1 year.  Yearly screening should continue until it has been 15 years since you quit.  Yearly screening should stop if you develop a health problem that would prevent you from having lung cancer treatment.  Breast Cancer  Practice breast self-awareness. This means understanding how your breasts normally appear and feel.  It also means doing regular breast self-exams. Let your health care provider know about any changes, no matter how small.  If you are in your 20s or 30s, you should have a clinical breast exam (CBE) by a health care provider every 1-3 years as part of a regular health exam.  If you are 76 or older, have a CBE every year. Also consider having a breast X-ray (mammogram) every year.  If you have a family history of breast cancer, talk to your health care provider about genetic screening.  If you are  at high risk for breast cancer, talk to your health care provider about having an MRI and a mammogram every year.  Breast cancer gene (BRCA) assessment is recommended for women who have family members with BRCA-related cancers. BRCA-related cancers include:  Breast.  Ovarian.  Tubal.  Peritoneal cancers.  Results of the assessment will determine the need for genetic counseling and BRCA1 and BRCA2 testing. Cervical Cancer Routine pelvic examinations to screen for cervical cancer are no longer recommended for nonpregnant women who are considered low  risk for cancer of the pelvic organs (ovaries, uterus, and vagina) and who do not have symptoms. A pelvic examination may be necessary if you have symptoms including those associated with pelvic infections. Ask your health care provider if a screening pelvic exam is right for you.   The Pap test is the screening test for cervical cancer for women who are considered at risk.  If you had a hysterectomy for a problem that was not cancer or a condition that could lead to cancer, then you no longer need Pap tests.  If you are older than 65 years, and you have had normal Pap tests for the past 10 years, you no longer need to have Pap tests.  If you have had past treatment for cervical cancer or a condition that could lead to cancer, you need Pap tests and screening for cancer for at least 20 years after your treatment.  If you no longer get a Pap test, assess your risk factors if they change (such as having a new sexual partner). This can affect whether you should start being screened again.  Some women have medical problems that increase their chance of getting cervical cancer. If this is the case for you, your health care provider may recommend more frequent screening and Pap tests.  The human papillomavirus (HPV) test is another test that may be used for cervical cancer screening. The HPV test looks for the virus that can cause cell changes in the cervix. The cells collected during the Pap test can be tested for HPV.  The HPV test can be used to screen women 30 years of age and older. Getting tested for HPV can extend the interval between normal Pap tests from three to five years.  An HPV test also should be used to screen women of any age who have unclear Pap test results.  After 54 years of age, women should have HPV testing as often as Pap tests.  Colorectal Cancer  This type of cancer can be detected and often prevented.  Routine colorectal cancer screening usually begins at 54 years of  age and continues through 54 years of age.  Your health care provider may recommend screening at an earlier age if you have risk factors for colon cancer.  Your health care provider may also recommend using home test kits to check for hidden blood in the stool.  A small camera at the end of a tube can be used to examine your colon directly (sigmoidoscopy or colonoscopy). This is done to check for the earliest forms of colorectal cancer.  Routine screening usually begins at age 50.  Direct examination of the colon should be repeated every 5-10 years through 54 years of age. However, you may need to be screened more often if early forms of precancerous polyps or small growths are found. Skin Cancer  Check your skin from head to toe regularly.  Tell your health care provider about any new moles or changes in   moles, especially if there is a change in a mole's shape or color.  Also tell your health care provider if you have a mole that is larger than the size of a pencil eraser.  Always use sunscreen. Apply sunscreen liberally and repeatedly throughout the day.  Protect yourself by wearing long sleeves, pants, a wide-brimmed hat, and sunglasses whenever you are outside. HEART DISEASE, DIABETES, AND HIGH BLOOD PRESSURE   Have your blood pressure checked at least every 1-2 years. High blood pressure causes heart disease and increases the risk of stroke.  If you are between 75 years and 42 years old, ask your health care provider if you should take aspirin to prevent strokes.  Have regular diabetes screenings. This involves taking a blood sample to check your fasting blood sugar level.  If you are at a normal weight and have a low risk for diabetes, have this test once every three years after 54 years of age.  If you are overweight and have a high risk for diabetes, consider being tested at a younger age or more often. PREVENTING INFECTION  Hepatitis B  If you have a higher risk for  hepatitis B, you should be screened for this virus. You are considered at high risk for hepatitis B if:  You were born in a country where hepatitis B is common. Ask your health care provider which countries are considered high risk.  Your parents were born in a high-risk country, and you have not been immunized against hepatitis B (hepatitis B vaccine).  You have HIV or AIDS.  You use needles to inject street drugs.  You live with someone who has hepatitis B.  You have had sex with someone who has hepatitis B.  You get hemodialysis treatment.  You take certain medicines for conditions, including cancer, organ transplantation, and autoimmune conditions. Hepatitis C  Blood testing is recommended for:  Everyone born from [birth year] through 1965.  Anyone with known risk factors for hepatitis C. Sexually transmitted infections (STIs)  You should be screened for sexually transmitted infections (STIs) including gonorrhea and chlamydia if:  You are sexually active and are younger than 54 years of age.  You are older than 54 years of age and your health care provider tells you that you are at risk for this type of infection.  Your sexual activity has changed since you were last screened and you are at an increased risk for chlamydia or gonorrhea. Ask your health care provider if you are at risk.  If you do not have HIV, but are at risk, it may be recommended that you take a prescription medicine daily to prevent HIV infection. This is called pre-exposure prophylaxis (PrEP). You are considered at risk if:  You are sexually active and do not regularly use condoms or know the HIV status of your partner(s).  You take drugs by injection.  You are sexually active with a partner who has HIV. Talk with your health care provider about whether you are at high risk of being infected with HIV. If you choose to begin PrEP, you should first be tested for HIV. You should then be tested every 3 months for  as long as you are taking PrEP.  PREGNANCY   If you are premenopausal and you may become pregnant, ask your health care provider about preconception counseling.  If you may become pregnant, take 400 to 800 micrograms (mcg) of folic acid every day.  If you want to prevent pregnancy, talk to your  health care provider about birth control (contraception). OSTEOPOROSIS AND MENOPAUSE   Osteoporosis is a disease in which the bones lose minerals and strength with aging. This can result in serious bone fractures. Your risk for osteoporosis can be identified using a bone density scan.  If you are 65 years of age or older, or if you are at risk for osteoporosis and fractures, ask your health care provider if you should be screened.  Ask your health care provider whether you should take a calcium or vitamin D supplement to lower your risk for osteoporosis.  Menopause may have certain physical symptoms and risks.  Hormone replacement therapy may reduce some of these symptoms and risks. Talk to your health care provider about whether hormone replacement therapy is right for you.  HOME CARE INSTRUCTIONS   Schedule regular health, dental, and eye exams.  Stay current with your immunizations.   Do not use any tobacco products including cigarettes, chewing tobacco, or electronic cigarettes.  If you are pregnant, do not drink alcohol.  If you are breastfeeding, limit how much and how often you drink alcohol.  Limit alcohol intake to no more than 1 drink per day for nonpregnant women. One drink equals 12 ounces of beer, 5 ounces of wine, or 1 ounces of hard liquor.  Do not use street drugs.  Do not share needles.  Ask your health care provider for help if you need support or information about quitting drugs.  Tell your health care provider if you often feel depressed.  Tell your health care provider if you have ever been abused or do not feel safe at home. Document Released: 03/11/2011  Document Revised: 01/10/2014 Document Reviewed: 07/28/2013 Exit[employer] Patient Information 2015 [employer]. This information is not intended to replace advice given to you by your health care provider. Make sure you discuss any questions you have with your health care provider. Food Choices for Gastroesophageal Reflux Disease When you have gastroesophageal reflux disease (GERD), the foods you eat and your eating habits are very important. Choosing the right foods can help ease the discomfort of GERD. WHAT GENERAL GUIDELINES DO I NEED TO FOLLOW?  Choose fruits, vegetables, whole grains, low-fat dairy products, and low-fat meat, fish, and poultry.  Limit fats such as oils, salad dressings, butter, nuts, and avocado.  Keep a food diary to identify foods that cause symptoms.  Avoid foods that cause reflux. These may be different for different people.  Eat frequent small meals instead of three large meals each day.  Eat your meals slowly, in a relaxed setting.  Limit fried foods.  Cook foods using methods other than frying.  Avoid drinking alcohol.  Avoid drinking large amounts of liquids with your meals.  Avoid bending over or lying down until 2-3 hours after eating. WHAT FOODS ARE NOT RECOMMENDED? The following are some foods and drinks that may worsen your symptoms: Vegetables Tomatoes. Tomato juice. Tomato and spaghetti sauce. Chili peppers. Onion and garlic. Horseradish. Fruits Oranges, grapefruit, and lemon (fruit and juice). Meats High-fat meats, fish, and poultry. This includes hot dogs, ribs, ham, sausage, salami, and bacon. Dairy Whole milk and chocolate milk. Sour cream. Cream. Butter. Ice cream. Cream cheese.  Beverages Coffee and tea, with or without caffeine. Carbonated beverages or energy drinks. Condiments Hot sauce. Barbecue sauce.  Sweets/Desserts Chocolate and cocoa. Donuts. Peppermint and spearmint. Fats and Oils High-fat foods, including French fries and  potato chips. Other Vinegar. Strong spices, such as black pepper, white pepper, red pepper, cayenne,   curry powder, cloves, ginger, and chili powder. The items listed above may not be a complete list of foods and beverages to avoid. Contact your dietitian for more information. Document Released: 08/26/2005 Document Revised: 08/31/2013 Document Reviewed: 06/30/2013 Exit[employer] Patient Information 2015 [employer]. This information is not intended to replace advice given to you by your health care provider. Make sure you discuss any questions you have with your health care provider.  

## 2015-01-02 NOTE — Progress Notes (Signed)
Subjective:    Patient ID: Tiffany Lynch, female    DOB: Jun 22, 1961, 54 y.o.   MRN: [id number]  Anxiety Presents for follow-up visit. Symptoms include decreased concentration. Patient reports no confusion, excessive worry, impotence, nausea, nervous/anxious behavior, palpitations or shortness of breath. Symptoms occur rarely. The quality of sleep is good.   Her past medical history is significant for anxiety/panic attacks, bipolar disorder and depression. Past treatments include SSRIs and benzodiazephines. The treatment provided significant relief. Compliance with prior treatments has been good.  Hyperlipidemia This is a chronic problem. The current episode started more than 1 year ago. The problem is uncontrolled. Recent lipid tests were reviewed and are high. Exacerbating diseases include hypothyroidism. She has no history of diabetes. Factors aggravating her hyperlipidemia include smoking. Pertinent negatives include no leg pain, myalgias or shortness of breath. Current antihyperlipidemic treatment includes statins. The current treatment provides significant improvement of lipids. Risk factors for coronary artery disease include dyslipidemia, family history, obesity and post-menopausal.  Gastrophageal Reflux She complains of belching and heartburn. She reports no choking, no coughing, no nausea, no stridor or no water brash. This is a chronic problem. The current episode started more than 1 year ago. The problem occurs frequently. The problem has been waxing and waning. The heartburn duration is less than a minute. The heartburn does not wake her from sleep. The symptoms are aggravated by certain foods and lying down. She has tried an antacid for the symptoms. The treatment provided mild relief.    *Pt goes to a Pain Clinic for chronic back pain.   Review of Systems  Constitutional: Negative.   HENT: Negative.   Eyes: Negative.   Respiratory: Negative.  Negative for cough, choking and  shortness of breath.   Cardiovascular: Negative.  Negative for palpitations.  Gastrointestinal: Positive for heartburn. Negative for nausea.  Endocrine: Negative.   Genitourinary: Negative.  Negative for impotence.  Musculoskeletal: Negative.  Negative for myalgias.  Neurological: Negative.  Negative for headaches.  Hematological: Negative.   Psychiatric/Behavioral: Positive for decreased concentration. Negative for confusion. The patient is not nervous/anxious.   All other systems reviewed and are negative.      Objective:   Physical Exam  Constitutional: She is oriented to person, place, and time. She appears well-developed and well-nourished. No distress.  HENT:  Head: Normocephalic and atraumatic.  Right Ear: External ear normal.  Left Ear: External ear normal.  Nose: Nose normal.  Mouth/Throat: Oropharynx is clear and moist.  Eyes: Pupils are equal, round, and reactive to light.  Neck: Normal range of motion. Neck supple. No thyromegaly present.  Cardiovascular: Normal rate, regular rhythm, normal heart sounds and intact distal pulses.   No murmur heard. Pulmonary/Chest: Effort normal and breath sounds normal. No respiratory distress. She has no wheezes.  Abdominal: Soft. Bowel sounds are normal. She exhibits no distension. There is no tenderness.  Musculoskeletal: Normal range of motion. She exhibits no edema or tenderness.  Neurological: She is alert and oriented to person, place, and time. She has normal reflexes. No cranial nerve deficit.  Skin: Skin is warm and dry.  Psychiatric: She has a normal mood and affect. Her behavior is normal. Judgment and thought content normal.  Vitals reviewed.     BP 112/73 mmHg  Pulse 89  Temp(Src) 98.4 F (36.9 C) (Oral)  Ht 5' 3" (1.6 m)  Wt 216 lb 12.8 oz (98.34 kg)  BMI 38.41 kg/m2  LMP 05/06/2012     Assessment & Plan:  1.  Anxiety - CMP14+EGFR  2. Chronic back pain - CMP14+EGFR - meloxicam (MOBIC) 7.5 MG tablet; Take  1 tablet (7.5 mg total) by mouth daily.  Dispense: 60 tablet; Refill: 2  3. Hyperlipidemia - CMP14+EGFR - Lipid panel - simvastatin (ZOCOR) 20 MG tablet; Take 1 tablet (20 mg total) by mouth at bedtime.  Dispense: 90 tablet; Refill: 3  4. Insomnia - CMP14+EGFR  5. Depression - CMP14+EGFR  6. Other fatigue - CMP14+EGFR - Thyroid Panel With TSH  7. OAB (overactive bladder) - CMP14+EGFR  8. Gastroesophageal reflux disease, esophagitis presence not specified - omeprazole (PRILOSEC) 20 MG capsule; Take 1 capsule (20 mg total) by mouth daily.  Dispense: 90 capsule; Refill: 3   Continue all meds- Keep appts with Pain clinic Labs pending Health Maintenance reviewed Diet and exercise encouraged RTO 6 months   , FNP   

## 2015-01-03 LAB — CMP14+EGFR
ALT: 17 IU/L (ref 0–32)
AST: 13 IU/L (ref 0–40)
Albumin/Globulin Ratio: 1.8 (ref 1.1–2.5)
Albumin: 4 g/dL (ref 3.5–5.5)
Alkaline Phosphatase: 95 IU/L (ref 39–117)
BUN/Creatinine Ratio: 14 (ref 9–23)
BUN: 12 mg/dL (ref 6–24)
Bilirubin Total: <0.2 mg/dL (ref 0.0–1.2)
CO2: 26 mmol/L (ref 18–29)
Calcium: 9.5 mg/dL (ref 8.7–10.2)
Chloride: 97 mmol/L (ref 97–108)
Creatinine, Ser: 0.87 mg/dL (ref 0.57–1.00)
GFR calc Af Amer: 87 mL/min/{1.73_m2} (ref >59)
GFR calc non Af Amer: 76 mL/min/{1.73_m2} (ref >59)
Globulin, Total: 2.2 g/dL (ref 1.5–4.5)
Glucose: 98 mg/dL (ref 65–99)
Potassium: 3.5 mmol/L (ref 3.5–5.2)
Sodium: 141 mmol/L (ref 134–144)
Total Protein: 6.2 g/dL (ref 6.0–8.5)

## 2015-01-03 LAB — THYROID PANEL WITH TSH
Free Thyroxine Index: 2.1 (ref 1.2–4.9)
T3 Uptake Ratio: 24 % (ref 24–39)
T4, Total: 8.7 ug/dL (ref 4.5–12.0)
TSH: 2.41 u[IU]/mL (ref 0.450–4.500)

## 2015-01-03 LAB — LIPID PANEL
Chol/HDL Ratio: 5.4 ratio units — ABNORMAL HIGH (ref 0.0–4.4)
Cholesterol, Total: 168 mg/dL (ref 100–199)
HDL: 31 mg/dL — ABNORMAL LOW (ref >39)
LDL Calculated: 83 mg/dL (ref 0–99)
Triglycerides: 268 mg/dL — ABNORMAL HIGH (ref 0–149)
VLDL Cholesterol Cal: 54 mg/dL — ABNORMAL HIGH (ref 5–40)

## 2015-04-14 ENCOUNTER — Other Ambulatory Visit: Payer: Self-pay | Admitting: Family Medicine

## 2015-05-04 ENCOUNTER — Telehealth: Payer: Self-pay | Admitting: Family

## 2015-05-04 DIAGNOSIS — N3281 Overactive bladder: Secondary | ICD-10-CM

## 2015-05-04 NOTE — Telephone Encounter (Signed)
Urologists referral sent

## 2015-06-27 ENCOUNTER — Other Ambulatory Visit (HOSPITAL_COMMUNITY): Payer: Self-pay | Admitting: Respiratory Therapy

## 2015-06-27 DIAGNOSIS — G4733 Obstructive sleep apnea (adult) (pediatric): Secondary | ICD-10-CM

## 2015-07-04 ENCOUNTER — Ambulatory Visit: Payer: Medicaid Other | Admitting: Family

## 2015-07-06 ENCOUNTER — Encounter: Payer: Self-pay | Admitting: Family

## 2015-07-14 ENCOUNTER — Encounter: Payer: Self-pay | Admitting: Family

## 2015-07-14 ENCOUNTER — Ambulatory Visit (INDEPENDENT_AMBULATORY_CARE_PROVIDER_SITE_OTHER): Payer: Medicaid Other | Admitting: Family

## 2015-07-14 VITALS — BP 123/83 | HR 89 | Temp 97.1°F | Ht 63.0 in | Wt 223.8 lb

## 2015-07-14 DIAGNOSIS — G8929 Other chronic pain: Secondary | ICD-10-CM | POA: Diagnosis not present

## 2015-07-14 DIAGNOSIS — F419 Anxiety disorder, unspecified: Secondary | ICD-10-CM | POA: Diagnosis not present

## 2015-07-14 DIAGNOSIS — F329 Major depressive disorder, single episode, unspecified: Secondary | ICD-10-CM

## 2015-07-14 DIAGNOSIS — G4700 Insomnia, unspecified: Secondary | ICD-10-CM | POA: Diagnosis not present

## 2015-07-14 DIAGNOSIS — M549 Dorsalgia, unspecified: Secondary | ICD-10-CM | POA: Diagnosis not present

## 2015-07-14 DIAGNOSIS — N3946 Mixed incontinence: Secondary | ICD-10-CM | POA: Insufficient documentation

## 2015-07-14 DIAGNOSIS — F319 Bipolar disorder, unspecified: Secondary | ICD-10-CM | POA: Diagnosis not present

## 2015-07-14 DIAGNOSIS — Z23 Encounter for immunization: Secondary | ICD-10-CM

## 2015-07-14 DIAGNOSIS — N3281 Overactive bladder: Secondary | ICD-10-CM | POA: Diagnosis not present

## 2015-07-14 DIAGNOSIS — E785 Hyperlipidemia, unspecified: Secondary | ICD-10-CM | POA: Diagnosis not present

## 2015-07-14 DIAGNOSIS — F32A Depression, unspecified: Secondary | ICD-10-CM

## 2015-07-14 DIAGNOSIS — Z1159 Encounter for screening for other viral diseases: Secondary | ICD-10-CM | POA: Diagnosis not present

## 2015-07-14 DIAGNOSIS — J42 Unspecified chronic bronchitis: Secondary | ICD-10-CM | POA: Diagnosis not present

## 2015-07-14 MED ORDER — MIRABEGRON ER 50 MG PO TB24: 50.0000 mg | ORAL_TABLET | Freq: Every day | ORAL | Status: DC

## 2015-07-14 NOTE — Progress Notes (Signed)
Subjective:    Patient ID: Tiffany Lynch, female    DOB: [birth year], 54 y.o.   MRN: [id number]  Pt presents to the office today for chronic follow up. Pt goes to a Pain Clinic, Dr. Merlene Laughter, for chronic back pain and Dr. Ready for Depression and GAD. Pt states her back pain is not controlled.  Hyperlipidemia This is a chronic problem. The current episode started more than 1 year ago. The problem is controlled. Recent lipid tests were reviewed and are normal. Exacerbating diseases include hypothyroidism. She has no history of diabetes. Factors aggravating her hyperlipidemia include smoking. Pertinent negatives include no leg pain, myalgias or shortness of breath. Current antihyperlipidemic treatment includes statins. The current treatment provides significant improvement of lipids. Risk factors for coronary artery disease include dyslipidemia, family history, obesity and post-menopausal.  Gastroesophageal Reflux She complains of belching and heartburn. She reports no choking, no coughing, no nausea, no stridor or no water brash. This is a chronic problem. The current episode started more than 1 year ago. The problem occurs frequently. The problem has been waxing and waning. The heartburn duration is less than a minute. The heartburn does not wake her from sleep. The symptoms are aggravated by certain foods and lying down. She has tried an antacid for the symptoms. The treatment provided mild relief.  Anxiety Presents for follow-up visit. Symptoms include decreased concentration. Patient reports no confusion, excessive worry, impotence, nausea, nervous/anxious behavior, palpitations or shortness of breath. Symptoms occur rarely. The quality of sleep is good.   Her past medical history is significant for anxiety/panic attacks, bipolar disorder and depression. Past treatments include SSRIs and benzodiazephines. The treatment provided significant relief. Compliance with prior treatments has been good.    OAB Pt currently taking myrbetriq 25 mg daily. Pt states she continues to wear a "pull up" 3-4 times a day. Pt states any times she coughs she "pees".   Review of Systems  Constitutional: Negative.   HENT: Negative.   Eyes: Negative.   Respiratory: Negative.  Negative for cough, choking and shortness of breath.   Cardiovascular: Negative.  Negative for palpitations.  Gastrointestinal: Positive for heartburn. Negative for nausea.  Endocrine: Negative.   Genitourinary: Negative.  Negative for impotence.  Musculoskeletal: Negative.  Negative for myalgias.  Neurological: Negative.  Negative for headaches.  Hematological: Negative.   Psychiatric/Behavioral: Positive for decreased concentration. Negative for confusion. The patient is not nervous/anxious.   All other systems reviewed and are negative.      Objective:   Physical Exam  Constitutional: She is oriented to person, place, and time. She appears well-developed and well-nourished. No distress.  HENT:  Head: Normocephalic and atraumatic.  Right Ear: External ear normal.  Left Ear: External ear normal.  Nose: Nose normal.  Mouth/Throat: Oropharynx is clear and moist.  Eyes: Pupils are equal, round, and reactive to light.  Neck: Normal range of motion. Neck supple. No thyromegaly present.  Cardiovascular: Normal rate, regular rhythm, normal heart sounds and intact distal pulses.   No murmur heard. Pulmonary/Chest: Effort normal and breath sounds normal. No respiratory distress. She has no wheezes.  Abdominal: Soft. Bowel sounds are normal. She exhibits no distension. There is no tenderness.  Musculoskeletal: Normal range of motion. She exhibits edema (trace amt in BLE). She exhibits no tenderness.  Neurological: She is alert and oriented to person, place, and time. She has normal reflexes. No cranial nerve deficit.  Skin: Skin is warm and dry.  Psychiatric: She has a normal mood  and affect. Her behavior is normal. Judgment and  thought content normal.  Vitals reviewed.     BP 123/83 mmHg  Pulse 89  Temp(Src) 97.1 F (36.2 C) (Oral)  Ht 5' 3"  (1.6 m)  Wt 223 lb 12.8 oz (101.515 kg)  BMI 39.65 kg/m2  LMP 05/06/2012     Assessment & Plan:  1. Chronic bronchitis, unspecified chronic bronchitis type ([place]) - CMP14+EGFR  2. OAB (overactive bladder) -Myrbetriq increased to 50 mg today from 25 mg - CMP14+EGFR - Ambulatory referral to Urology - mirabegron ER (MYRBETRIQ) 50 MG TB24 tablet; Take 1 tablet (50 mg total) by mouth daily.  Dispense: 90 tablet; Refill: 1  3. Anxiety - CMP14+EGFR  4. Bipolar 1 disorder (HCC) - CMP14+EGFR  5. Chronic back pain - CMP14+EGFR  6. Depression - CMP14+EGFR  7. Hyperlipidemia - CMP14+EGFR - Lipid panel  8. Insomnia - CMP14+EGFR  9. Urge and stress incontinence - CMP14+EGFR - Ambulatory referral to Urology - mirabegron ER (MYRBETRIQ) 50 MG TB24 tablet; Take 1 tablet (50 mg total) by mouth daily.  Dispense: 90 tablet; Refill: 1  10. Need for hepatitis C screening test - CMP14+EGFR - Hepatitis C antibody   Continue all meds Labs pending Health Maintenance reviewed Diet and exercise encouraged RTO 6 months   Evelina Dun, FNP

## 2015-07-14 NOTE — Patient Instructions (Signed)

## 2015-07-17 ENCOUNTER — Telehealth: Payer: Self-pay | Admitting: Family

## 2015-07-17 DIAGNOSIS — Z1159 Encounter for screening for other viral diseases: Secondary | ICD-10-CM

## 2015-07-17 DIAGNOSIS — E785 Hyperlipidemia, unspecified: Secondary | ICD-10-CM

## 2015-07-17 DIAGNOSIS — J42 Unspecified chronic bronchitis: Secondary | ICD-10-CM

## 2015-07-20 ENCOUNTER — Ambulatory Visit (INDEPENDENT_AMBULATORY_CARE_PROVIDER_SITE_OTHER): Payer: Medicaid Other | Admitting: Family

## 2015-07-20 ENCOUNTER — Encounter: Payer: Self-pay | Admitting: Family

## 2015-07-20 VITALS — BP 105/72 | HR 103 | Temp 98.6°F | Ht 63.0 in | Wt 225.0 lb

## 2015-07-20 DIAGNOSIS — M79672 Pain in left foot: Secondary | ICD-10-CM | POA: Diagnosis not present

## 2015-07-20 DIAGNOSIS — Z716 Tobacco abuse counseling: Secondary | ICD-10-CM | POA: Diagnosis not present

## 2015-07-20 DIAGNOSIS — Z720 Tobacco use: Secondary | ICD-10-CM | POA: Diagnosis not present

## 2015-07-20 DIAGNOSIS — M79671 Pain in right foot: Secondary | ICD-10-CM | POA: Diagnosis not present

## 2015-07-20 DIAGNOSIS — F17200 Nicotine dependence, unspecified, uncomplicated: Secondary | ICD-10-CM

## 2015-07-20 MED ORDER — MELOXICAM 15 MG PO TABS: 15.0000 mg | ORAL_TABLET | Freq: Every day | ORAL | Status: DC

## 2015-07-20 MED ORDER — NICOTINE 21 MG/24HR TD PT24: 21.0000 mg | MEDICATED_PATCH | Freq: Every day | TRANSDERMAL | Status: DC

## 2015-07-20 NOTE — Patient Instructions (Signed)
Peripheral Neuropathy Peripheral neuropathy is a type of nerve damage. It affects nerves that carry signals between the spinal cord and other parts of the body. These are called peripheral nerves. With peripheral neuropathy, one nerve or a group of nerves may be damaged.  CAUSES  Many things can damage peripheral nerves. For some people with peripheral neuropathy, the cause is unknown. Some causes include:  Diabetes. This is the most common cause of peripheral neuropathy.  Injury to a nerve.  Pressure or stress on a nerve that lasts a long time.  Too little vitamin B. Alcoholism can lead to this.  Infections.  Autoimmune diseases, such as multiple sclerosis and systemic lupus erythematosus.  Inherited nerve diseases.  Some medicines, such as cancer drugs.  Toxic substances, such as lead and mercury.  Too little blood flowing to the legs.  Kidney disease.  Thyroid disease. SIGNS AND SYMPTOMS  Different people have different symptoms. The symptoms you have will depend on which of your nerves is damaged. Common symptoms include:  Loss of feeling (numbness) in the feet and hands.  Tingling in the feet and hands.  Pain that burns.  Very sensitive skin.  Weakness.  Not being able to move a part of the body (paralysis).  Muscle twitching.  Clumsiness or poor coordination.  Loss of balance.  Not being able to control your bladder.  Feeling dizzy.  Sexual problems. DIAGNOSIS  Peripheral neuropathy is a symptom, not a disease. Finding the cause of peripheral neuropathy can be hard. To figure that out, your health care provider will take a medical history and do a physical exam. A neurological exam will also be done. This involves checking things affected by your brain, spinal cord, and nerves (nervous system). For example, your health care provider will check your reflexes, how you move, and what you can feel.  Other types of tests may also be ordered, such as:  Blood  tests.  A test of the fluid in your spinal cord.  Imaging tests, such as CT scans or an MRI.  Electromyography (EMG). This test checks the nerves that control muscles.  Nerve conduction velocity tests. These tests check how fast messages pass through your nerves.  Nerve biopsy. A small piece of nerve is removed. It is then checked under a microscope. TREATMENT   Medicine is often used to treat peripheral neuropathy. Medicines may include:  Pain-relieving medicines. Prescription or over-the-counter medicine may be suggested.  Antiseizure medicine. This may be used for pain.  Antidepressants. These also may help ease pain from neuropathy.  Lidocaine. This is a numbing medicine. You might wear a patch or be given a shot.  Mexiletine. This medicine is typically used to help control irregular heart rhythms.  Surgery. Surgery may be needed to relieve pressure on a nerve or to destroy a nerve that is causing pain.  Physical therapy to help movement.  Assistive devices to help movement. HOME CARE INSTRUCTIONS   Only take over-the-counter or prescription medicines as directed by your health care provider. Follow the instructions carefully for any given medicines. Do not take any other medicines without first getting approval from your health care provider.  If you have diabetes, work closely with your health care provider to keep your blood sugar under control.  If you have numbness in your feet:  Check every day for signs of injury or infection. Watch for redness, warmth, and swelling.  Wear padded socks and comfortable shoes. These help protect your feet.  Do not do   things that put pressure on your damaged nerve.  Do not smoke. Smoking keeps blood from getting to damaged nerves.  Avoid or limit alcohol. Too much alcohol can cause a lack of B vitamins. These vitamins are needed for healthy nerves.  Develop a good support system. Coping with peripheral neuropathy can be  stressful. Talk to a mental health specialist or join a support group if you are struggling.  Follow up with your health care provider as directed. SEEK MEDICAL CARE IF:   You have new signs or symptoms of peripheral neuropathy.  You are struggling emotionally from dealing with peripheral neuropathy.  You have a fever. SEEK IMMEDIATE MEDICAL CARE IF:   You have an injury or infection that is not healing.  You feel very dizzy or begin vomiting.  You have chest pain.  You have trouble breathing.   This information is not intended to replace advice given to you by your health care provider. Make sure you discuss any questions you have with your health care provider.   Document Released: 08/16/2002 Document Revised: 05/08/2011 Document Reviewed: 05/03/2013 Elsevier Interactive Patient Education 2016 [employer]. Smoking Cessation, Tips for Success If you are ready to quit smoking, congratulations! You have chosen to help yourself be healthier. Cigarettes bring nicotine, tar, carbon monoxide, and other irritants into your body. Your lungs, heart, and blood vessels will be able to work better without these poisons. There are many different ways to quit smoking. Nicotine gum, nicotine patches, a nicotine inhaler, or nicotine nasal spray can help with physical craving. Hypnosis, support groups, and medicines help break the habit of smoking. WHAT THINGS CAN I DO TO MAKE QUITTING EASIER?  Here are some tips to help you quit for good:  Pick a date when you will quit smoking completely. Tell all of your friends and family about your plan to quit on that date.  Do not try to slowly cut down on the number of cigarettes you are smoking. Pick a quit date and quit smoking completely starting on that day.  Throw away all cigarettes.   Clean and remove all ashtrays from your home, work, and car.  On a card, write down your reasons for quitting. Carry the card with you and read it when you get  the urge to smoke.  Cleanse your body of nicotine. Drink enough water and fluids to keep your urine clear or pale yellow. Do this after quitting to flush the nicotine from your body.  Learn to predict your moods. Do not let a bad situation be your excuse to have a cigarette. Some situations in your life might tempt you into wanting a cigarette.  Never have "just one" cigarette. It leads to wanting another and another. Remind yourself of your decision to quit.  Change habits associated with smoking. If you smoked while driving or when feeling stressed, try other activities to replace smoking. Stand up when drinking your coffee. Brush your teeth after eating. Sit in a different chair when you read the paper. Avoid alcohol while trying to quit, and try to drink fewer caffeinated beverages. Alcohol and caffeine may urge you to smoke.  Avoid foods and drinks that can trigger a desire to smoke, such as sugary or spicy foods and alcohol.  Ask people who smoke not to smoke around you.  Have something planned to do right after eating or having a cup of coffee. For example, plan to take a walk or exercise.  Try a relaxation exercise to calm  you down and decrease your stress. Remember, you may be tense and nervous for the first 2 weeks after you quit, but this will pass.  Find new activities to keep your hands busy. Play with a pen, coin, or rubber band. Doodle or draw things on paper.  Brush your teeth right after eating. This will help cut down on the craving for the taste of tobacco after meals. You can also try mouthwash.   Use oral substitutes in place of cigarettes. Try using lemon drops, carrots, cinnamon sticks, or chewing gum. Keep them handy so they are available when you have the urge to smoke.  When you have the urge to smoke, try deep breathing.  Designate your home as a nonsmoking area.  If you are a heavy smoker, ask your health care provider about a prescription for nicotine chewing  gum. It can ease your withdrawal from nicotine.  Reward yourself. Set aside the cigarette money you save and buy yourself something nice.  Look for support from others. Join a support group or smoking cessation program. Ask someone at home or at work to help you with your plan to quit smoking.  Always ask yourself, "Do I need this cigarette or is this just a reflex?" Tell yourself, "Today, I choose not to smoke," or "I do not want to smoke." You are reminding yourself of your decision to quit.  Do not replace cigarette smoking with electronic cigarettes (commonly called e-cigarettes). The safety of e-cigarettes is unknown, and some may contain harmful chemicals.  If you relapse, do not give up! Plan ahead and think about what you will do the next time you get the urge to smoke. HOW WILL I FEEL WHEN I QUIT SMOKING? You may have symptoms of withdrawal because your body is used to nicotine (the addictive substance in cigarettes). You may crave cigarettes, be irritable, feel very hungry, cough often, get headaches, or have difficulty concentrating. The withdrawal symptoms are only temporary. They are strongest when you first quit but will go away within 10-14 days. When withdrawal symptoms occur, stay in control. Think about your reasons for quitting. Remind yourself that these are signs that your body is healing and getting used to being without cigarettes. Remember that withdrawal symptoms are easier to treat than the major diseases that smoking can cause.  Even after the withdrawal is over, expect periodic urges to smoke. However, these cravings are generally short lived and will go away whether you smoke or not. Do not smoke! WHAT RESOURCES ARE AVAILABLE TO HELP ME QUIT SMOKING? Your health care provider can direct you to community resources or hospitals for support, which may include:  Group support.  Education.  Hypnosis.  Therapy.   This information is not intended to replace advice given  to you by your health care provider. Make sure you discuss any questions you have with your health care provider.   Document Released: 05/24/2004 Document Revised: 09/16/2014 Document Reviewed: 02/11/2013 Elsevier Interactive Patient Education [employer].

## 2015-07-20 NOTE — Progress Notes (Signed)
   Subjective:    Patient ID: Tiffany Lynch, female    DOB: [birth year], 54 y.o.   MRN: [id number]  HPI Pt presents to the office today for bilateral foot pain. Pt states she is having constant throbbing pain of 8 out 10. Pt states when she stands up from the bed she thinks she is going to fall. Pt goes to a Pain Clinic for chronic back pain and he had pt on gabapentin, but then tried Lyrica. Pt states she has stopped the Lyrica and put herself back onto the the gabapentin. Pt states it seems to work slightly better, but does not help. Pt states she also has been using her Voltarn gel on bilateral feet with no relief. PT states she got the "flu shot last week and since then has not be able to get out bed". Pt states she feels worse and weak.    Review of Systems  Constitutional: Negative.   HENT: Negative.   Eyes: Negative.   Respiratory: Negative.  Negative for shortness of breath.   Cardiovascular: Negative.  Negative for palpitations.  Gastrointestinal: Negative.   Endocrine: Negative.   Genitourinary: Negative.   Musculoskeletal: Negative.   Neurological: Negative.  Negative for headaches.  Hematological: Negative.   Psychiatric/Behavioral: Negative.   All other systems reviewed and are negative.      Objective:   Physical Exam  Constitutional: She is oriented to person, place, and time. She appears well-developed and well-nourished. No distress.  HENT:  Head: Normocephalic and atraumatic.  Eyes: Pupils are equal, round, and reactive to light.  Neck: Normal range of motion. Neck supple. No thyromegaly present.  Cardiovascular: Normal rate, regular rhythm, normal heart sounds and intact distal pulses.   No murmur heard. Pulmonary/Chest: Effort normal and breath sounds normal. No respiratory distress. She has no wheezes.  Abdominal: Soft. Bowel sounds are normal. She exhibits no distension. There is no tenderness.  Musculoskeletal: Normal range of motion. She exhibits edema  (trace amt in left foot). She exhibits no tenderness.  Neurological: She is alert and oriented to person, place, and time. She has normal reflexes. No cranial nerve deficit.  Skin: Skin is warm and dry.  Psychiatric: She has a normal mood and affect. Her behavior is normal. Judgment and thought content normal.  Vitals reviewed.   BP 105/72 mmHg  Pulse 103  Temp(Src) 98.6 F (37 C) (Oral)  Ht 5\' 3"  (1.6 m)  Wt 225 lb (102.059 kg)  BMI 39.87 kg/m2  LMP 05/06/2012       Assessment & Plan:  1. Current smoker -Smoking cessation  - nicotine (EQ NICOTINE) 21 mg/24hr patch; Place 1 patch (21 mg total) onto the skin daily.  Dispense: 28 patch; Refill: 0  2. Bilateral foot pain -Pt needs to follow up with pain clinic- Pt gets all of her pain medication from them and they are managing her gabapentin  - meloxicam (MOBIC) 15 MG tablet; Take 1 tablet (15 mg total) by mouth daily.  Dispense: 30 tablet; Refill: 0  3. Encounter for smoking cessation counseling Smoking cessation discussed - nicotine (EQ NICOTINE) 21 mg/24hr patch; Place 1 patch (21 mg total) onto the skin daily.  Dispense: 28 patch; Refill: 0   Evelina Dun, FNP

## 2015-07-24 NOTE — Telephone Encounter (Signed)
-----   Message from Theron Arista, RN sent at 07/20/2015 11:38 AM EST ----- Narda Rutherford, Will you call this patient per [facility] and tell her she needs this blood work drawn if she refuses the order needs to be cancelled Thanks  Sharee Pimple  ----- Message -----    From: Sharion Balloon, FNP    Sent: 07/20/2015   8:14 AM      To: [facility]  Pt needs to have blood work drawn ----- Message -----    From: SYSTEM    Sent: 07/19/2015  12:04 AM      To: Sharion Balloon, FNP

## 2015-07-24 NOTE — Telephone Encounter (Signed)
Patient will try to come by this week to have   CMP, lipid and Hep C drawn since she missed doing it at her last visit.

## 2015-08-02 ENCOUNTER — Telehealth: Payer: Self-pay

## 2015-08-02 NOTE — Telephone Encounter (Signed)
Insurance prior authorized  [employer]  [id number]

## 2015-08-02 NOTE — Telephone Encounter (Signed)
Medicaid no preferred Myrbetriq  Preferred are Oxybutynin tablet Toviaz tablet Vesicare tablet

## 2015-08-02 NOTE — Telephone Encounter (Signed)
Pt has been on Oxybutynin in past but could not tolerate related to side effects

## 2015-09-13 ENCOUNTER — Ambulatory Visit: Payer: Medicaid Other | Admitting: Urology

## 2015-09-18 ENCOUNTER — Encounter: Payer: Self-pay | Admitting: Family Medicine

## 2015-09-18 ENCOUNTER — Ambulatory Visit (INDEPENDENT_AMBULATORY_CARE_PROVIDER_SITE_OTHER): Payer: Medicaid Other | Admitting: Family Medicine

## 2015-09-18 VITALS — BP 117/81 | HR 99 | Temp 97.8°F | Ht 63.0 in | Wt 222.2 lb

## 2015-09-18 DIAGNOSIS — J0100 Acute maxillary sinusitis, unspecified: Secondary | ICD-10-CM

## 2015-09-18 DIAGNOSIS — M79672 Pain in left foot: Secondary | ICD-10-CM

## 2015-09-18 DIAGNOSIS — G8929 Other chronic pain: Secondary | ICD-10-CM | POA: Diagnosis not present

## 2015-09-18 DIAGNOSIS — J42 Unspecified chronic bronchitis: Secondary | ICD-10-CM | POA: Diagnosis not present

## 2015-09-18 DIAGNOSIS — M79671 Pain in right foot: Secondary | ICD-10-CM

## 2015-09-18 DIAGNOSIS — M549 Dorsalgia, unspecified: Secondary | ICD-10-CM

## 2015-09-18 MED ORDER — MELOXICAM 15 MG PO TABS: 15.0000 mg | ORAL_TABLET | Freq: Every day | ORAL | Status: DC

## 2015-09-18 NOTE — Progress Notes (Signed)
Subjective:    Patient ID: Tiffany Lynch, female    DOB: 08-16-1961, 55 y.o.   MRN: [id number]  HPI 55 year old female with complaints of pain in her back, which is chronic, for which she sees pain doctor, left knee pain and sinus pressure headache and drainage. Regarding her back and she gets tramadol acupressure and occasional hydrocodone for pain. She is followed in the pain clinic. She cannot tell me the nature of her back pain whether it's related to disc disease or what. She has received some epidural injections which would make me think it's a possibility.  Regarding her left knee. She injured it some 10 years or so ago but not recently. There is no history of noise when she goes up and down the steps. There is a history that the knee may give way with her.  The sinus symptoms are more recent. She has had some pain and pressure in her head and under her left eye. There is some cough productive of colored sputum.  Patient Active Problem List   Diagnosis Date Noted  . Current smoker 07/20/2015  . Urge and stress incontinence 07/14/2015  . Hyperlipidemia 01/02/2015  . Insomnia 01/02/2015  . OAB (overactive bladder) 01/02/2015  . Depression 01/02/2015  . GERD (gastroesophageal reflux disease) 01/02/2015  . ARF (acute renal failure) ([place]) 12/21/2013  . COPD (chronic obstructive pulmonary disease) ([place]) 05/22/2012  . Dysfunctional uterine bleeding 04/01/2012  . Orthostatic hypotension 03/31/2012  . Encephalopathy acute 03/30/2012  . Chronic back pain   . Bipolar 1 disorder ([place])   . Anxiety    Outpatient Encounter Prescriptions as of 09/18/2015  Medication Sig  . ALPRAZolam (XANAX) 1 MG tablet Take 1 mg by mouth 4 (four) times daily. Patient says she takes 3 daily  . buPROPion (WELLBUTRIN XL) 300 MG 24 hr tablet Take 300 mg by mouth every morning.  . busPIRone (BUSPAR) 15 MG tablet Take 15 mg by mouth 3 (three) times daily.  . DULoxetine (CYMBALTA) 60 MG capsule Take 60 mg by  mouth every morning.   . gabapentin (NEURONTIN) 600 MG tablet Take 600 mg by mouth.  . mirabegron ER (MYRBETRIQ) 50 MG TB24 tablet Take 1 tablet (50 mg total) by mouth daily.  . Multiple Vitamin (MULTIVITAMIN WITH MINERALS) TABS tablet Take 1 tablet by mouth every morning.  . nicotine (EQ NICOTINE) 21 mg/24hr patch Place 1 patch (21 mg total) onto the skin daily.  Marland Kitchen omeprazole (PRILOSEC) 20 MG capsule Take 1 capsule (20 mg total) by mouth daily.  . risperiDONE (RISPERDAL) 2 MG tablet Take 1 tablet (2 mg total) by mouth at bedtime.  . simvastatin (ZOCOR) 20 MG tablet Take 1 tablet (20 mg total) by mouth at bedtime.  . terbinafine (LAMISIL) 250 MG tablet Take 1 tablet (250 mg total) by mouth daily.  . traMADol (ULTRAM) 50 MG tablet Take 50-100 mg by mouth every 6 (six) hours as needed for moderate pain.  . traZODone (DESYREL) 100 MG tablet Take 300 mg by mouth at bedtime.  . cholecalciferol (VITAMIN D) 400 UNITS TABS tablet Take 400 Units by mouth every morning. Reported on 09/18/2015  . hydrochlorothiazide (MICROZIDE) 12.5 MG capsule Take 1 capsule (12.5 mg total) by mouth daily.  Marland Kitchen HYDROcodone-acetaminophen (NORCO/VICODIN) 5-325 MG per tablet Take 1 tablet by mouth every 4 (four) hours as needed for moderate pain (started on 12/17/2013 #15). Reported on 09/18/2015  . meloxicam (MOBIC) 15 MG tablet Take 1 tablet (15 mg total) by mouth daily. (  Patient not taking: Reported on 09/18/2015)  . [DISCONTINUED] diclofenac sodium (VOLTAREN) 1 % GEL Apply topically 4 (four) times daily.   No facility-administered encounter medications on file as of 09/18/2015.      Review of Systems  Constitutional: Negative.   HENT: Positive for congestion, postnasal drip and sore throat.   Respiratory: Negative.   Cardiovascular: Negative.   Musculoskeletal: Positive for back pain and arthralgias.  Psychiatric/Behavioral: Negative.        Objective:   Physical Exam  Constitutional: She is oriented to person, place,  and time. She appears well-developed and well-nourished.  HENT:  Head: Normocephalic.  Mouth/Throat: Oropharynx is clear and moist.  Tenderness in maxillary sinus area with percussion  Cardiovascular: Normal rate.   Pulmonary/Chest: Effort normal and breath sounds normal.  Musculoskeletal:  Left knee: There is no effusion. There is no specific joint line tenderness. The knee is stable to stress testing. Drawer sign is negative.  Neurological: She is alert and oriented to person, place, and time.          Assessment & Plan:      3. Chronic back pain Follow-up with her in clinic. Declined to use any other analgesics today but patient clearly would like more hydrocodone as she asked several times.  4. Acute maxillary sinusitis, recurrence not specified We'll treat with amoxicillin 500 3 times a day. Mucinex as needed.  5. Left knee pain I cannot detect obvious etiology. I will try anti-inflammatory and neoprene knee sleeve. Symptoms are not resolved probably needs referral for further evaluation  Wardell Honour MD

## 2015-09-19 ENCOUNTER — Telehealth: Payer: Self-pay | Admitting: Family Medicine

## 2015-09-19 MED ORDER — AMOXICILLIN 500 MG PO CAPS: 500.0000 mg | ORAL_CAPSULE | Freq: Three times a day (TID) | ORAL | Status: DC

## 2015-09-19 NOTE — Telephone Encounter (Signed)
Left detailed message advising rx sent to pharmacy for meloxicam and antibiotic and the mucinex is otc to CB with any fur any further questions or concerns.

## 2015-09-26 ENCOUNTER — Other Ambulatory Visit: Payer: Self-pay | Admitting: Family

## 2015-10-18 ENCOUNTER — Ambulatory Visit (INDEPENDENT_AMBULATORY_CARE_PROVIDER_SITE_OTHER): Payer: Medicaid Other | Admitting: Urology

## 2015-10-18 DIAGNOSIS — N3941 Urge incontinence: Secondary | ICD-10-CM

## 2015-10-18 DIAGNOSIS — N3281 Overactive bladder: Secondary | ICD-10-CM | POA: Diagnosis not present

## 2015-10-18 DIAGNOSIS — N390 Urinary tract infection, site not specified: Secondary | ICD-10-CM

## 2015-10-26 ENCOUNTER — Ambulatory Visit (INDEPENDENT_AMBULATORY_CARE_PROVIDER_SITE_OTHER): Payer: Medicaid Other

## 2015-10-26 ENCOUNTER — Encounter (INDEPENDENT_AMBULATORY_CARE_PROVIDER_SITE_OTHER): Payer: Self-pay

## 2015-10-26 ENCOUNTER — Ambulatory Visit (INDEPENDENT_AMBULATORY_CARE_PROVIDER_SITE_OTHER): Payer: Medicaid Other | Admitting: Family

## 2015-10-26 ENCOUNTER — Encounter: Payer: Self-pay | Admitting: Family

## 2015-10-26 VITALS — BP 122/84 | HR 88 | Temp 97.6°F | Ht 63.0 in | Wt 224.6 lb

## 2015-10-26 DIAGNOSIS — M25531 Pain in right wrist: Secondary | ICD-10-CM

## 2015-10-26 DIAGNOSIS — Z720 Tobacco use: Secondary | ICD-10-CM | POA: Diagnosis not present

## 2015-10-26 DIAGNOSIS — M79644 Pain in right finger(s): Secondary | ICD-10-CM | POA: Diagnosis not present

## 2015-10-26 DIAGNOSIS — F17200 Nicotine dependence, unspecified, uncomplicated: Secondary | ICD-10-CM

## 2015-10-26 NOTE — Progress Notes (Signed)
   Subjective:    Patient ID: Tiffany Lynch, female    DOB: 1960-12-05, 55 y.o.   MRN: [id number]  Hand Pain  The incident occurred 2 days ago. There was no injury mechanism. The pain is present in the right hand and right wrist. The quality of the pain is described as aching. The pain does not radiate. The pain is at a severity of 8/10. The pain is moderate. Pertinent negatives include no numbness or tingling. Nothing aggravates the symptoms. She has tried acetaminophen and NSAIDs for the symptoms. The treatment provided mild relief.      Review of Systems  Constitutional: Negative.   HENT: Negative.   Eyes: Negative.   Respiratory: Negative.  Negative for shortness of breath.   Cardiovascular: Negative.  Negative for palpitations.  Gastrointestinal: Negative.   Endocrine: Negative.   Genitourinary: Negative.   Musculoskeletal: Negative.   Neurological: Negative.  Negative for tingling, numbness and headaches.  Hematological: Negative.   Psychiatric/Behavioral: Negative.   All other systems reviewed and are negative.      Objective:   Physical Exam  Constitutional: She is oriented to person, place, and time. She appears well-developed and well-nourished. No distress.  HENT:  Head: Normocephalic and atraumatic.  Eyes: Pupils are equal, round, and reactive to light.  Neck: Normal range of motion. Neck supple. No thyromegaly present.  Cardiovascular: Normal rate, regular rhythm, normal heart sounds and intact distal pulses.   No murmur heard. Pulmonary/Chest: Effort normal and breath sounds normal. No respiratory distress. She has no wheezes.  Abdominal: Soft. Bowel sounds are normal. She exhibits no distension. There is no tenderness.  Musculoskeletal: Normal range of motion. She exhibits edema and tenderness.  Neurological: She is alert and oriented to person, place, and time. She has normal reflexes. No cranial nerve deficit.  Skin: Skin is warm and dry.  Psychiatric: She  has a normal mood and affect. Her behavior is normal. Judgment and thought content normal.  Vitals reviewed.  X-ray- thumb necrotic joint Preliminary reading by Evelina Dun, FNP [facility]    BP 122/84 mmHg  Pulse 88  Temp(Src) 97.6 F (36.4 C) (Oral)  Ht 5\' 3"  (1.6 m)  Wt 224 lb 9.6 oz (101.878 kg)  BMI 39.80 kg/m2  LMP 05/06/2012     Assessment & Plan:  1. Thumb pain, right -Referral to ortho -Continue pain medication as needed - DG Hand Complete Right; Future - Ambulatory referral to Orthopedic Surgery  2. Right wrist pain - DG Hand Complete Right; Future - Ambulatory referral to Orthopedic Surgery  3. Current smoker -Smoking cessation discussed -Will hold off on ordering chantix at this time- Continue nicotine patches  Evelina Dun, FNP

## 2015-10-26 NOTE — Patient Instructions (Addendum)
Arthritis Arthritis is a term that is commonly used to refer to joint pain or joint disease. There are more than 100 types of arthritis. CAUSES The most common cause of this condition is wear and tear of a joint. Other causes include:  Gout.  Inflammation of a joint.  An infection of a joint.  Sprains and other injuries near the joint.  A drug reaction or allergic reaction. In some cases, the cause may not be known. SYMPTOMS The main symptom of this condition is pain in the joint with movement. Other symptoms include:  Redness, swelling, or stiffness at a joint.  Warmth coming from the joint.  Fever.  Overall feeling of illness. DIAGNOSIS This condition may be diagnosed with a physical exam and tests, including:  Blood tests.  Urine tests.  Imaging tests, such as MRI, X-rays, or a CT scan. Sometimes, fluid is removed from a joint for testing. TREATMENT Treatment for this condition may involve:  Treatment of the cause, if it is known.  Rest.  Raising (elevating) the joint.  Applying cold or hot packs to the joint.  Medicines to improve symptoms and reduce inflammation.  Injections of a steroid such as cortisone into the joint to help reduce pain and inflammation. Depending on the cause of your arthritis, you may need to make lifestyle changes to reduce stress on your joint. These changes may include exercising more and losing weight. HOME CARE INSTRUCTIONS Medicines  Take over-the-counter and prescription medicines only as told by your health care provider.  Do not take aspirin to relieve pain if gout is suspected. Activities  Rest your joint if told by your health care provider. Rest is important when your disease is active and your joint feels painful, swollen, or stiff.  Avoid activities that make the pain worse. It is important to balance activity with rest.  Exercise your joint regularly with range-of-motion exercises as told by your health care  provider. Try doing low-impact exercise, such as:  Swimming.  Water aerobics.  Biking.  Walking. Joint Care  If your joint is swollen, keep it elevated if told by your health care provider.  If your joint feels stiff in the morning, try taking a warm shower.  If directed, apply heat to the joint. If you have diabetes, do not apply heat without permission from your health care provider.  Put a towel between the joint and the hot pack or heating pad.  Leave the heat on the area for 20-30 minutes.  If directed, apply ice to the joint:  Put ice in a plastic bag.  Place a towel between your skin and the bag.  Leave the ice on for 20 minutes, 2-3 times per day.  Keep all follow-up visits as told by your health care provider. This is important. SEEK MEDICAL CARE IF:  The pain gets worse.  You have a fever. SEEK IMMEDIATE MEDICAL CARE IF:  You develop severe joint pain, swelling, or redness.  Many joints become painful and swollen.  You develop severe back pain.  You develop severe weakness in your leg.  You cannot control your bladder or bowels.   This information is not intended to replace advice given to you by your health care provider. Make sure you discuss any questions you have with your health care provider.   Document Released: 10/03/2004 Document Revised: 05/17/2015 Document Reviewed: 11/21/2014 Elsevier Interactive Patient Education 2016 [employer]. Smoking Cessation, Tips for Success If you are ready to quit smoking, congratulations! You have  chosen to help yourself be healthier. Cigarettes bring nicotine, tar, carbon monoxide, and other irritants into your body. Your lungs, heart, and blood vessels will be able to work better without these poisons. There are many different ways to quit smoking. Nicotine gum, nicotine patches, a nicotine inhaler, or nicotine nasal spray can help with physical craving. Hypnosis, support groups, and medicines help break the  habit of smoking. WHAT THINGS CAN I DO TO MAKE QUITTING EASIER?  Here are some tips to help you quit for good:  Pick a date when you will quit smoking completely. Tell all of your friends and family about your plan to quit on that date.  Do not try to slowly cut down on the number of cigarettes you are smoking. Pick a quit date and quit smoking completely starting on that day.  Throw away all cigarettes.   Clean and remove all ashtrays from your home, work, and car.  On a card, write down your reasons for quitting. Carry the card with you and read it when you get the urge to smoke.  Cleanse your body of nicotine. Drink enough water and fluids to keep your urine clear or pale yellow. Do this after quitting to flush the nicotine from your body.  Learn to predict your moods. Do not let a bad situation be your excuse to have a cigarette. Some situations in your life might tempt you into wanting a cigarette.  Never have "just one" cigarette. It leads to wanting another and another. Remind yourself of your decision to quit.  Change habits associated with smoking. If you smoked while driving or when feeling stressed, try other activities to replace smoking. Stand up when drinking your coffee. Brush your teeth after eating. Sit in a different chair when you read the paper. Avoid alcohol while trying to quit, and try to drink fewer caffeinated beverages. Alcohol and caffeine may urge you to smoke.  Avoid foods and drinks that can trigger a desire to smoke, such as sugary or spicy foods and alcohol.  Ask people who smoke not to smoke around you.  Have something planned to do right after eating or having a cup of coffee. For example, plan to take a walk or exercise.  Try a relaxation exercise to calm you down and decrease your stress. Remember, you may be tense and nervous for the first 2 weeks after you quit, but this will pass.  Find new activities to keep your hands busy. Play with a pen,  coin, or rubber band. Doodle or draw things on paper.  Brush your teeth right after eating. This will help cut down on the craving for the taste of tobacco after meals. You can also try mouthwash.   Use oral substitutes in place of cigarettes. Try using lemon drops, carrots, cinnamon sticks, or chewing gum. Keep them handy so they are available when you have the urge to smoke.  When you have the urge to smoke, try deep breathing.  Designate your home as a nonsmoking area.  If you are a heavy smoker, ask your health care provider about a prescription for nicotine chewing gum. It can ease your withdrawal from nicotine.  Reward yourself. Set aside the cigarette money you save and buy yourself something nice.  Look for support from others. Join a support group or smoking cessation program. Ask someone at home or at work to help you with your plan to quit smoking.  Always ask yourself, "Do I need this cigarette or is  this just a reflex?" Tell yourself, "Today, I choose not to smoke," or "I do not want to smoke." You are reminding yourself of your decision to quit.  Do not replace cigarette smoking with electronic cigarettes (commonly called e-cigarettes). The safety of e-cigarettes is unknown, and some may contain harmful chemicals.  If you relapse, do not give up! Plan ahead and think about what you will do the next time you get the urge to smoke. HOW WILL I FEEL WHEN I QUIT SMOKING? You may have symptoms of withdrawal because your body is used to nicotine (the addictive substance in cigarettes). You may crave cigarettes, be irritable, feel very hungry, cough often, get headaches, or have difficulty concentrating. The withdrawal symptoms are only temporary. They are strongest when you first quit but will go away within 10-14 days. When withdrawal symptoms occur, stay in control. Think about your reasons for quitting. Remind yourself that these are signs that your body is healing and getting used  to being without cigarettes. Remember that withdrawal symptoms are easier to treat than the major diseases that smoking can cause.  Even after the withdrawal is over, expect periodic urges to smoke. However, these cravings are generally short lived and will go away whether you smoke or not. Do not smoke! WHAT RESOURCES ARE AVAILABLE TO HELP ME QUIT SMOKING? Your health care provider can direct you to community resources or hospitals for support, which may include:  Group support.  Education.  Hypnosis.  Therapy.   This information is not intended to replace advice given to you by your health care provider. Make sure you discuss any questions you have with your health care provider.   Document Released: 05/24/2004 Document Revised: 09/16/2014 Document Reviewed: 02/11/2013 Elsevier Interactive Patient Education [employer].

## 2015-10-27 ENCOUNTER — Telehealth: Payer: Self-pay | Admitting: Family

## 2015-10-27 ENCOUNTER — Telehealth: Payer: Self-pay | Admitting: *Deleted

## 2015-10-27 NOTE — Telephone Encounter (Signed)
Patient aware.

## 2015-10-27 NOTE — Telephone Encounter (Signed)
Pt already taking Norco for pain. Can not prescribe anything else. Sorry

## 2015-10-27 NOTE — Telephone Encounter (Signed)
Patient missed appointment with pain management .  It was explained that our provider can not give another script when there is a contract with a pain clinic.  She was advised that ibuprofen may help so she wants the provider to send in a script .  I advised that insurance may not pay but a message would be sent to provider.

## 2015-10-30 NOTE — Telephone Encounter (Signed)
Advised pt of provider feedback and pt voiced understanding. °

## 2015-10-30 NOTE — Telephone Encounter (Signed)
Pt is on Mobic and can not take ibuprofen with that

## 2015-10-31 ENCOUNTER — Ambulatory Visit (INDEPENDENT_AMBULATORY_CARE_PROVIDER_SITE_OTHER): Payer: Medicaid Other | Admitting: Family

## 2015-10-31 ENCOUNTER — Encounter: Payer: Self-pay | Admitting: Family

## 2015-10-31 VITALS — BP 136/101 | HR 90 | Temp 97.4°F | Ht 63.0 in | Wt 220.0 lb

## 2015-10-31 DIAGNOSIS — E785 Hyperlipidemia, unspecified: Secondary | ICD-10-CM

## 2015-10-31 DIAGNOSIS — F419 Anxiety disorder, unspecified: Secondary | ICD-10-CM

## 2015-10-31 DIAGNOSIS — M79672 Pain in left foot: Secondary | ICD-10-CM

## 2015-10-31 DIAGNOSIS — Z0000 Encounter for general adult medical examination without abnormal findings: Secondary | ICD-10-CM | POA: Diagnosis not present

## 2015-10-31 DIAGNOSIS — K219 Gastro-esophageal reflux disease without esophagitis: Secondary | ICD-10-CM

## 2015-10-31 DIAGNOSIS — Z01419 Encounter for gynecological examination (general) (routine) without abnormal findings: Secondary | ICD-10-CM

## 2015-10-31 DIAGNOSIS — F17200 Nicotine dependence, unspecified, uncomplicated: Secondary | ICD-10-CM

## 2015-10-31 DIAGNOSIS — R5383 Other fatigue: Secondary | ICD-10-CM

## 2015-10-31 DIAGNOSIS — N938 Other specified abnormal uterine and vaginal bleeding: Secondary | ICD-10-CM

## 2015-10-31 DIAGNOSIS — G8929 Other chronic pain: Secondary | ICD-10-CM

## 2015-10-31 DIAGNOSIS — M549 Dorsalgia, unspecified: Secondary | ICD-10-CM

## 2015-10-31 DIAGNOSIS — F32A Depression, unspecified: Secondary | ICD-10-CM

## 2015-10-31 DIAGNOSIS — N3946 Mixed incontinence: Secondary | ICD-10-CM

## 2015-10-31 DIAGNOSIS — F319 Bipolar disorder, unspecified: Secondary | ICD-10-CM

## 2015-10-31 DIAGNOSIS — N3281 Overactive bladder: Secondary | ICD-10-CM | POA: Diagnosis not present

## 2015-10-31 DIAGNOSIS — M79671 Pain in right foot: Secondary | ICD-10-CM

## 2015-10-31 DIAGNOSIS — J42 Unspecified chronic bronchitis: Secondary | ICD-10-CM

## 2015-10-31 DIAGNOSIS — F329 Major depressive disorder, single episode, unspecified: Secondary | ICD-10-CM

## 2015-10-31 DIAGNOSIS — G4700 Insomnia, unspecified: Secondary | ICD-10-CM

## 2015-10-31 LAB — POCT URINALYSIS DIPSTICK
Bilirubin, UA: NEGATIVE
Blood, UA: NEGATIVE
Glucose, UA: NEGATIVE
Ketones, UA: NEGATIVE
Leukocytes, UA: NEGATIVE
Nitrite, UA: NEGATIVE
Protein, UA: NEGATIVE
Spec Grav, UA: <=1.005
Urobilinogen, UA: NEGATIVE
pH, UA: 5

## 2015-10-31 LAB — POCT UA - MICROSCOPIC ONLY
Casts, Ur, LPF, POC: NEGATIVE
Crystals, Ur, HPF, POC: NEGATIVE
Mucus, UA: NEGATIVE
Yeast, UA: NEGATIVE

## 2015-10-31 MED ORDER — TERBINAFINE HCL 250 MG PO TABS: 250.0000 mg | ORAL_TABLET | Freq: Every day | ORAL | Status: DC

## 2015-10-31 MED ORDER — MELOXICAM 15 MG PO TABS: 15.0000 mg | ORAL_TABLET | Freq: Every day | ORAL | Status: DC

## 2015-10-31 NOTE — Patient Instructions (Signed)
Health Maintenance, Female Adopting a healthy lifestyle and getting preventive care can go a long way to promote health and wellness. Talk with your health care provider about what schedule of regular examinations is right for you. This is a good chance for you to check in with your provider about disease prevention and staying healthy. In between checkups, there are plenty of things you can do on your own. Experts have done a lot of research about which lifestyle changes and preventive measures are most likely to keep you healthy. Ask your health care provider for more information. WEIGHT AND DIET  Eat a healthy diet  Be sure to include plenty of vegetables, fruits, low-fat dairy products, and lean protein.  Do not eat a lot of foods high in solid fats, added sugars, or salt.  Get regular exercise. This is one of the most important things you can do for your health.  Most adults should exercise for at least 150 minutes each week. The exercise should increase your heart rate and make you sweat (moderate-intensity exercise).  Most adults should also do strengthening exercises at least twice a week. This is in addition to the moderate-intensity exercise.  Maintain a healthy weight  Body mass index (BMI) is a measurement that can be used to identify possible weight problems. It estimates body fat based on height and weight. Your health care provider can help determine your BMI and help you achieve or maintain a healthy weight.  For females 20 years of age and older:   A BMI below 18.5 is considered underweight.  A BMI of 18.5 to 24.9 is normal.  A BMI of 25 to 29.9 is considered overweight.  A BMI of 30 and above is considered obese.  Watch levels of cholesterol and blood lipids  You should start having your blood tested for lipids and cholesterol at 55 years of age, then have this test every 5 years.  You may need to have your cholesterol levels checked more often if:  Your lipid  or cholesterol levels are high.  You are older than 55 years of age.  You are at high risk for heart disease.  CANCER SCREENING   Lung Cancer  Lung cancer screening is recommended for adults 55-80 years old who are at high risk for lung cancer because of a history of smoking.  A yearly low-dose CT scan of the lungs is recommended for people who:  Currently smoke.  Have quit within the past 15 years.  Have at least a 30-pack-year history of smoking. A pack year is smoking an average of one pack of cigarettes a day for 1 year.  Yearly screening should continue until it has been 15 years since you quit.  Yearly screening should stop if you develop a health problem that would prevent you from having lung cancer treatment.  Breast Cancer  Practice breast self-awareness. This means understanding how your breasts normally appear and feel.  It also means doing regular breast self-exams. Let your health care provider know about any changes, no matter how small.  If you are in your 20s or 30s, you should have a clinical breast exam (CBE) by a health care provider every 1-3 years as part of a regular health exam.  If you are 40 or older, have a CBE every year. Also consider having a breast X-ray (mammogram) every year.  If you have a family history of breast cancer, talk to your health care provider about genetic screening.  If you   are at high risk for breast cancer, talk to your health care provider about having an MRI and a mammogram every year.  Breast cancer gene (BRCA) assessment is recommended for women who have family members with BRCA-related cancers. BRCA-related cancers include:  Breast.  Ovarian.  Tubal.  Peritoneal cancers.  Results of the assessment will determine the need for genetic counseling and BRCA1 and BRCA2 testing. Cervical Cancer Your health care provider may recommend that you be screened regularly for cancer of the pelvic organs (ovaries, uterus, and  vagina). This screening involves a pelvic examination, including checking for microscopic changes to the surface of your cervix (Pap test). You may be encouraged to have this screening done every 3 years, beginning at age 21.  For women ages 30-65, health care providers may recommend pelvic exams and Pap testing every 3 years, or they may recommend the Pap and pelvic exam, combined with testing for human papilloma virus (HPV), every 5 years. Some types of HPV increase your risk of cervical cancer. Testing for HPV may also be done on women of any age with unclear Pap test results.  Other health care providers may not recommend any screening for nonpregnant women who are considered low risk for pelvic cancer and who do not have symptoms. Ask your health care provider if a screening pelvic exam is right for you.  If you have had past treatment for cervical cancer or a condition that could lead to cancer, you need Pap tests and screening for cancer for at least 20 years after your treatment. If Pap tests have been discontinued, your risk factors (such as having a new sexual partner) need to be reassessed to determine if screening should resume. Some women have medical problems that increase the chance of getting cervical cancer. In these cases, your health care provider may recommend more frequent screening and Pap tests. Colorectal Cancer  This type of cancer can be detected and often prevented.  Routine colorectal cancer screening usually begins at 55 years of age and continues through 55 years of age.  Your health care provider may recommend screening at an earlier age if you have risk factors for colon cancer.  Your health care provider may also recommend using home test kits to check for hidden blood in the stool.  A small camera at the end of a tube can be used to examine your colon directly (sigmoidoscopy or colonoscopy). This is done to check for the earliest forms of colorectal  cancer.  Routine screening usually begins at age 50.  Direct examination of the colon should be repeated every 5-10 years through 55 years of age. However, you may need to be screened more often if early forms of precancerous polyps or small growths are found. Skin Cancer  Check your skin from head to toe regularly.  Tell your health care provider about any new moles or changes in moles, especially if there is a change in a mole's shape or color.  Also tell your health care provider if you have a mole that is larger than the size of a pencil eraser.  Always use sunscreen. Apply sunscreen liberally and repeatedly throughout the day.  Protect yourself by wearing long sleeves, pants, a wide-brimmed hat, and sunglasses whenever you are outside. HEART DISEASE, DIABETES, AND HIGH BLOOD PRESSURE   High blood pressure causes heart disease and increases the risk of stroke. High blood pressure is more likely to develop in:  People who have blood pressure in the high end   of the normal range (130-139/85-89 mm Hg).  People who are overweight or obese.  People who are African American.  If you are 38-23 years of age, have your blood pressure checked every 3-5 years. If you are 61 years of age or older, have your blood pressure checked every year. You should have your blood pressure measured twice--once when you are at a hospital or clinic, and once when you are not at a hospital or clinic. Record the average of the two measurements. To check your blood pressure when you are not at a hospital or clinic, you can use:  An automated blood pressure machine at a pharmacy.  A home blood pressure monitor.  If you are between 45 years and 39 years old, ask your health care provider if you should take aspirin to prevent strokes.  Have regular diabetes screenings. This involves taking a blood sample to check your fasting blood sugar level.  If you are at a normal weight and have a low risk for diabetes,  have this test once every three years after 55 years of age.  If you are overweight and have a high risk for diabetes, consider being tested at a younger age or more often. PREVENTING INFECTION  Hepatitis B  If you have a higher risk for hepatitis B, you should be screened for this virus. You are considered at high risk for hepatitis B if:  You were born in a country where hepatitis B is common. Ask your health care provider which countries are considered high risk.  Your parents were born in a high-risk country, and you have not been immunized against hepatitis B (hepatitis B vaccine).  You have HIV or AIDS.  You use needles to inject street drugs.  You live with someone who has hepatitis B.  You have had sex with someone who has hepatitis B.  You get hemodialysis treatment.  You take certain medicines for conditions, including cancer, organ transplantation, and autoimmune conditions. Hepatitis C  Blood testing is recommended for:  Everyone born from [birth year] through 1965.  Anyone with known risk factors for hepatitis C. Sexually transmitted infections (STIs)  You should be screened for sexually transmitted infections (STIs) including gonorrhea and chlamydia if:  You are sexually active and are younger than 55 years of age.  You are older than 55 years of age and your health care provider tells you that you are at risk for this type of infection.  Your sexual activity has changed since you were last screened and you are at an increased risk for chlamydia or gonorrhea. Ask your health care provider if you are at risk.  If you do not have HIV, but are at risk, it may be recommended that you take a prescription medicine daily to prevent HIV infection. This is called pre-exposure prophylaxis (PrEP). You are considered at risk if:  You are sexually active and do not regularly use condoms or know the HIV status of your partner(s).  You take drugs by injection.  You are sexually  active with a partner who has HIV. Talk with your health care provider about whether you are at high risk of being infected with HIV. If you choose to begin PrEP, you should first be tested for HIV. You should then be tested every 3 months for as long as you are taking PrEP.  PREGNANCY   If you are premenopausal and you may become pregnant, ask your health care provider about preconception counseling.  If you may  become pregnant, take 400 to 800 micrograms (mcg) of folic acid every day.  If you want to prevent pregnancy, talk to your health care provider about birth control (contraception). OSTEOPOROSIS AND MENOPAUSE   Osteoporosis is a disease in which the bones lose minerals and strength with aging. This can result in serious bone fractures. Your risk for osteoporosis can be identified using a bone density scan.  If you are 61 years of age or older, or if you are at risk for osteoporosis and fractures, ask your health care provider if you should be screened.  Ask your health care provider whether you should take a calcium or vitamin D supplement to lower your risk for osteoporosis.  Menopause may have certain physical symptoms and risks.  Hormone replacement therapy may reduce some of these symptoms and risks. Talk to your health care provider about whether hormone replacement therapy is right for you.  HOME CARE INSTRUCTIONS   Schedule regular health, dental, and eye exams.  Stay current with your immunizations.   Do not use any tobacco products including cigarettes, chewing tobacco, or electronic cigarettes.  If you are pregnant, do not drink alcohol.  If you are breastfeeding, limit how much and how often you drink alcohol.  Limit alcohol intake to no more than 1 drink per day for nonpregnant women. One drink equals 12 ounces of beer, 5 ounces of wine, or 1 ounces of hard liquor.  Do not use street drugs.  Do not share needles.  Ask your health care provider for help if  you need support or information about quitting drugs.  Tell your health care provider if you often feel depressed.  Tell your health care provider if you have ever been abused or do not feel safe at home.   This information is not intended to replace advice given to you by your health care provider. Make sure you discuss any questions you have with your health care provider.   Document Released: 03/11/2011 Document Revised: 09/16/2014 Document Reviewed: 07/28/2013 Elsevier Interactive Patient Education [employer].

## 2015-10-31 NOTE — Progress Notes (Signed)
Subjective:    Patient ID: Tiffany Lynch, female    DOB: 1961/07/21, 55 y.o.   MRN: [id number]  Pt presents to the office today for CPE with pap. PT is followed by [facility] every 2-3 months for chronic pain and chronic back pain. Pt has an appt Ortho on 11/03/15 for her right wrist/hand pain arthritis/joint pain. Pt states she has gained 80-90 lb over the last 3 years.  Gynecologic Exam Pertinent negatives include no headaches or nausea.  Anxiety Presents for follow-up visit. Symptoms include decreased concentration and dry mouth. Patient reports no confusion, excessive worry, impotence, nausea, nervous/anxious behavior, palpitations or shortness of breath. Symptoms occur rarely. The quality of sleep is good.   Her past medical history is significant for anxiety/panic attacks, bipolar disorder and depression. Past treatments include SSRIs and benzodiazephines. The treatment provided significant relief. Compliance with prior treatments has been good.  Hyperlipidemia This is a chronic problem. The current episode started more than 1 year ago. The problem is uncontrolled. Recent lipid tests were reviewed and are high. Exacerbating diseases include hypothyroidism. She has no history of diabetes. Factors aggravating her hyperlipidemia include smoking. Pertinent negatives include no leg pain, myalgias or shortness of breath. Current antihyperlipidemic treatment includes statins and diet change. The current treatment provides mild improvement of lipids. Risk factors for coronary artery disease include dyslipidemia, family history, obesity and post-menopausal.  Gastroesophageal Reflux She complains of belching and heartburn. She reports no choking, no coughing, no nausea, no stridor or no water brash. This is a chronic problem. The current episode started more than 1 year ago. The problem occurs frequently. The problem has been waxing and waning. The heartburn duration is less than a minute. The  heartburn does not wake her from sleep. The symptoms are aggravated by certain foods and lying down. She has tried an antacid and a PPI for the symptoms. The treatment provided mild relief.  Arthritis Presents for follow-up visit. The disease course has been stable. She complains of pain, stiffness and joint warmth. Affected locations include the right MCP. Her pain is at a severity of 8/10. Associated symptoms include dry mouth and pain while resting. Pertinent negatives include no pain at night. Her past medical history is significant for osteoarthritis. Past treatments include rest. The treatment provided mild relief.  OAB Pt currently taking Vesicare 5 mg daily for stress and urge incontinence. Pt states this has helped.     Review of Systems  Constitutional: Negative.   Eyes: Negative.   Respiratory: Negative.  Negative for cough, choking and shortness of breath.   Cardiovascular: Negative.  Negative for palpitations.  Gastrointestinal: Positive for heartburn. Negative for nausea.  Endocrine: Negative.   Genitourinary: Negative.  Negative for impotence.  Musculoskeletal: Positive for arthritis and stiffness. Negative for myalgias.  Neurological: Negative.  Negative for headaches.  Hematological: Negative.   Psychiatric/Behavioral: Positive for decreased concentration. Negative for confusion. The patient is not nervous/anxious.   All other systems reviewed and are negative.      Objective:   Physical Exam  Constitutional: She is oriented to person, place, and time. She appears well-developed and well-nourished. No distress.  HENT:  Head: Normocephalic and atraumatic.  Right Ear: External ear normal.  Left Ear: External ear normal.  Nose: Nose normal.  Mouth/Throat: Oropharynx is clear and moist.  Eyes: Pupils are equal, round, and reactive to light.  Neck: Normal range of motion. Neck supple. No thyromegaly present.  Cardiovascular: Normal rate, regular rhythm, normal heart  sounds and intact distal pulses.   No murmur heard. Pulmonary/Chest: Effort normal and breath sounds normal. No respiratory distress. She has no wheezes. Right breast exhibits no inverted nipple, no mass, no nipple discharge, no skin change and no tenderness. Left breast exhibits no inverted nipple, no mass, no nipple discharge, no skin change and no tenderness. Breasts are symmetrical.  Abdominal: Soft. Bowel sounds are normal. She exhibits no distension. There is no tenderness.  Genitourinary: Vagina normal.  Bimanual exam- no adnexal masses or tenderness, ovaries nonpalpable   Cervix parous and pink- No discharge   Musculoskeletal: Normal range of motion. She exhibits no edema or tenderness.  Neurological: She is alert and oriented to person, place, and time. She has normal reflexes. No cranial nerve deficit.  Skin: Skin is warm and dry.  Psychiatric: She has a normal mood and affect. Her behavior is normal. Judgment and thought content normal.  Vitals reviewed.     BP 136/101 mmHg  Pulse 90  Temp(Src) 97.4 F (36.3 C) (Oral)  Ht 5' 3"  (1.6 m)  Wt 220 lb (99.791 kg)  BMI 38.98 kg/m2  LMP 05/06/2012     Assessment & Plan:  1. Encounter for routine gynecological examination - POCT urinalysis dipstick - POCT UA - Microscopic Only - CMP14+EGFR - Pap IG w/ reflex to HPV when ASC-U  2. Chronic bronchitis, unspecified chronic bronchitis type ([place]) - CMP14+EGFR  3. Gastroesophageal reflux disease, esophagitis presence not specified - CMP14+EGFR  4. OAB (overactive bladder) - CMP14+EGFR  5. Chronic back pain - CMP14+EGFR  6. Bipolar 1 disorder (HCC) - CMP14+EGFR  7. Anxiety - CMP14+EGFR  8. Hyperlipidemia - CMP14+EGFR - Lipid panel  9. Insomnia - CMP14+EGFR  10. Depression - CMP14+EGFR  11. Urge and stress incontinence - CMP14+EGFR  12. Current smoker - CMP14+EGFR  13. Dysfunctional uterine bleeding - CMP14+EGFR - Pap IG w/ reflex to HPV when  ASC-U  14. Bilateral foot pain - CMP14+EGFR - meloxicam (MOBIC) 15 MG tablet; Take 1 tablet (15 mg total) by mouth daily.  Dispense: 15 tablet; Refill: 0  15. Physical exam - Anemia Profile B - CMP14+EGFR - Lipid panel - Thyroid Panel With TSH - VITAMIN D 25 Hydroxy (Vit-D Deficiency, Fractures) - Pap IG w/ reflex to HPV when ASC-U  16. Other fatigue - Anemia Profile B - CMP14+EGFR - Thyroid Panel With TSH - VITAMIN D 25 Hydroxy (Vit-D Deficiency, Fractures)   Continue all meds Labs pending Health Maintenance reviewed Diet and exercise encouraged RTO 3 months  Evelina Dun, FNP    ,

## 2015-11-01 LAB — CMP14+EGFR
ALT: 21 IU/L (ref 0–32)
AST: 17 IU/L (ref 0–40)
Albumin/Globulin Ratio: 1.6 (ref 1.1–2.5)
Albumin: 4 g/dL (ref 3.5–5.5)
Alkaline Phosphatase: 112 IU/L (ref 39–117)
BUN/Creatinine Ratio: 10 (ref 9–23)
BUN: 10 mg/dL (ref 6–24)
Bilirubin Total: 0.2 mg/dL (ref 0.0–1.2)
CO2: 27 mmol/L (ref 18–29)
Calcium: 9.5 mg/dL (ref 8.7–10.2)
Chloride: 95 mmol/L — ABNORMAL LOW (ref 96–106)
Creatinine, Ser: 0.98 mg/dL (ref 0.57–1.00)
GFR calc Af Amer: 76 mL/min/{1.73_m2} (ref >59)
GFR calc non Af Amer: 66 mL/min/{1.73_m2} (ref >59)
Globulin, Total: 2.5 g/dL (ref 1.5–4.5)
Glucose: 104 mg/dL — ABNORMAL HIGH (ref 65–99)
Potassium: 4.3 mmol/L (ref 3.5–5.2)
Sodium: 138 mmol/L (ref 134–144)
Total Protein: 6.5 g/dL (ref 6.0–8.5)

## 2015-11-01 LAB — ANEMIA PROFILE B
Basophils Absolute: 0 10*3/uL (ref 0.0–0.2)
Basos: 0 %
EOS (ABSOLUTE): 0.2 10*3/uL (ref 0.0–0.4)
Eos: 2 %
Ferritin: 45 ng/mL (ref 15–150)
Folate: 6 ng/mL (ref >3.0)
Hematocrit: 45.2 % (ref 34.0–46.6)
Hemoglobin: 15.4 g/dL (ref 11.1–15.9)
Immature Grans (Abs): 0 10*3/uL (ref 0.0–0.1)
Immature Granulocytes: 0 %
Iron Saturation: 20 % (ref 15–55)
Iron: 63 ug/dL (ref 27–159)
Lymphocytes Absolute: 3.2 10*3/uL — ABNORMAL HIGH (ref 0.7–3.1)
Lymphs: 36 %
MCH: 29.4 pg (ref 26.6–33.0)
MCHC: 34.1 g/dL (ref 31.5–35.7)
MCV: 86 fL (ref 79–97)
Monocytes Absolute: 0.4 10*3/uL (ref 0.1–0.9)
Monocytes: 5 %
Neutrophils Absolute: 5 10*3/uL (ref 1.4–7.0)
Neutrophils: 57 %
Platelets: 288 10*3/uL (ref 150–379)
RBC: 5.24 x10E6/uL (ref 3.77–5.28)
RDW: 15.3 % (ref 12.3–15.4)
Retic Ct Pct: 1.5 % (ref 0.6–2.6)
Total Iron Binding Capacity: 322 ug/dL (ref 250–450)
UIBC: 259 ug/dL (ref 131–425)
Vitamin B-12: 464 pg/mL (ref 211–946)
WBC: 8.9 10*3/uL (ref 3.4–10.8)

## 2015-11-01 LAB — LIPID PANEL
Chol/HDL Ratio: 5.5 ratio units — ABNORMAL HIGH (ref 0.0–4.4)
Cholesterol, Total: 219 mg/dL — ABNORMAL HIGH (ref 100–199)
HDL: 40 mg/dL (ref >39)
LDL Calculated: 133 mg/dL — ABNORMAL HIGH (ref 0–99)
Triglycerides: 230 mg/dL — ABNORMAL HIGH (ref 0–149)
VLDL Cholesterol Cal: 46 mg/dL — ABNORMAL HIGH (ref 5–40)

## 2015-11-01 LAB — THYROID PANEL WITH TSH
Free Thyroxine Index: 1.8 (ref 1.2–4.9)
T3 Uptake Ratio: 25 % (ref 24–39)
T4, Total: 7.3 ug/dL (ref 4.5–12.0)
TSH: 2.28 u[IU]/mL (ref 0.450–4.500)

## 2015-11-01 LAB — VITAMIN D 25 HYDROXY (VIT D DEFICIENCY, FRACTURES): Vit D, 25-Hydroxy: 22.7 ng/mL — ABNORMAL LOW (ref 30.0–100.0)

## 2015-11-02 ENCOUNTER — Other Ambulatory Visit: Payer: Self-pay | Admitting: Family

## 2015-11-02 ENCOUNTER — Ambulatory Visit: Payer: Medicaid Other | Admitting: Pharmacist

## 2015-11-02 LAB — PAP IG W/ RFLX HPV ASCU: PAP Smear Comment: 0

## 2015-11-02 MED ORDER — VITAMIN D (ERGOCALCIFEROL) 1.25 MG (50000 UNIT) PO CAPS: 50000.0000 [IU] | ORAL_CAPSULE | ORAL | Status: DC

## 2015-11-10 ENCOUNTER — Encounter: Payer: Self-pay | Admitting: Family Medicine

## 2015-11-10 ENCOUNTER — Ambulatory Visit (INDEPENDENT_AMBULATORY_CARE_PROVIDER_SITE_OTHER): Payer: Medicaid Other

## 2015-11-10 ENCOUNTER — Ambulatory Visit (INDEPENDENT_AMBULATORY_CARE_PROVIDER_SITE_OTHER): Payer: Medicaid Other | Admitting: Family Medicine

## 2015-11-10 VITALS — BP 145/97 | HR 87 | Temp 97.1°F | Ht 63.0 in | Wt 216.7 lb

## 2015-11-10 DIAGNOSIS — M25562 Pain in left knee: Secondary | ICD-10-CM | POA: Diagnosis not present

## 2015-11-10 NOTE — Patient Instructions (Signed)
Great to meet you!  Try ice for 10 top 15 minutes, 3 times daily Also try compression, an ace bandage is good for this  Come back if you are not better with in 2-3 weeks, we could consider knee injection

## 2015-11-10 NOTE — Progress Notes (Signed)
   HPI  Patient presents today here with left knee pain.  Patient explains that 30 years ago she fell on her left knee and a field, she has not had problems since that time.  Over the last 2 weeks she complains of proximal anterior knee pain. It seems to be consistent and persistent throughout the day. She has random episodes of relief without any cause. She increased pain with walking. She has no swelling or erythema of the knee. She's eating and drinking normally She's taking tramadol and meloxicam for pain relief. She has not tried ice or compression  PMH: Smoking status noted ROS: Per HPI  Objective: BP 145/97 mmHg  Pulse 87  Temp(Src) 97.1 F (36.2 C) (Oral)  Ht 5\' 3"  (1.6 m)  Wt 216 lb 11.2 oz (98.294 kg)  BMI 38.40 kg/m2  LMP 05/06/2012 Gen: NAD, alert, cooperative with exam HEENT: NCAT CV: RRR, good S1/S2, no murmur Resp: CTABL, no wheezes, non-labored Ext: No edema, warm Neuro: Alert and oriented, No gross deficits  MSK: L knee without erythema, effusion, bruising, or gross deformity Well healed scar at 10 oclock facing the patella No joint line tenderness.  ligamentously intact to Lachman's and with varus and valgus stress.  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 DG Knee 1-2 Views Left    Standing Status: Future     Number of Occurrences: 1     Standing Expiration Date: 01/09/2017    Order Specific Question:  Reason for Exam (SYMPTOM  OR DIAGNOSIS REQUIRED)    Answer:  knee pain    Order Specific Question:  Is the patient pregnant?    Answer:  No    Order Specific Question:  Preferred imaging location?    Answer:  Internal    Laroy Apple, MD [facility] 11/10/2015, 9:43 AM

## 2015-11-11 ENCOUNTER — Other Ambulatory Visit: Payer: Self-pay | Admitting: Family

## 2015-11-14 ENCOUNTER — Ambulatory Visit: Payer: Medicaid Other | Admitting: Family Medicine

## 2015-11-15 ENCOUNTER — Encounter: Payer: Self-pay | Admitting: Family

## 2015-11-16 ENCOUNTER — Ambulatory Visit: Payer: Medicaid Other | Admitting: Family Medicine

## 2015-11-17 ENCOUNTER — Encounter: Payer: Self-pay | Admitting: Family

## 2015-11-17 ENCOUNTER — Ambulatory Visit (INDEPENDENT_AMBULATORY_CARE_PROVIDER_SITE_OTHER): Payer: Medicaid Other | Admitting: Family

## 2015-11-17 VITALS — BP 120/85 | HR 104 | Temp 98.2°F | Ht 63.0 in | Wt 212.4 lb

## 2015-11-17 DIAGNOSIS — J029 Acute pharyngitis, unspecified: Secondary | ICD-10-CM

## 2015-11-17 DIAGNOSIS — M25562 Pain in left knee: Secondary | ICD-10-CM | POA: Diagnosis not present

## 2015-11-17 DIAGNOSIS — J069 Acute upper respiratory infection, unspecified: Secondary | ICD-10-CM | POA: Diagnosis not present

## 2015-11-17 LAB — RAPID STREP SCREEN (MED CTR MEBANE ONLY): Strep Gp A Ag, IA W/Reflex: NEGATIVE

## 2015-11-17 LAB — CULTURE, GROUP A STREP

## 2015-11-17 MED ORDER — AZITHROMYCIN 250 MG PO TABS: ORAL_TABLET | ORAL | Status: DC

## 2015-11-17 MED ORDER — PREDNISONE 10 MG (21) PO TBPK: 10.0000 mg | ORAL_TABLET | Freq: Every day | ORAL | Status: DC

## 2015-11-17 NOTE — Progress Notes (Addendum)
Subjective:    Patient ID: Tiffany Lynch, female    DOB: 1960-10-29, 55 y.o.   MRN: [id number]  Sore Throat  This is a new problem. The current episode started in the past 7 days. The problem has been gradually worsening. There has been no fever. The pain is at a severity of 8/10. The pain is mild. Associated symptoms include congestion, headaches, a hoarse voice, swollen glands and trouble swallowing. Pertinent negatives include no coughing, ear discharge, ear pain, plugged ear sensation, neck pain or shortness of breath. She has had no exposure to strep. She has tried acetaminophen for the symptoms. The treatment provided mild relief.  Knee Pain  The incident occurred more than 1 week ago. There was no injury mechanism. The pain is present in the left knee. The quality of the pain is described as aching. The pain is at a severity of 10/10. The pain is moderate. Pertinent negatives include no loss of sensation or muscle weakness. She reports no foreign bodies present. The treatment provided mild relief.      Review of Systems  Constitutional: Negative.   HENT: Positive for congestion, hoarse voice and trouble swallowing. Negative for ear discharge and ear pain.   Eyes: Negative.   Respiratory: Negative.  Negative for cough and shortness of breath.   Cardiovascular: Negative.  Negative for palpitations.  Gastrointestinal: Negative.   Endocrine: Negative.   Genitourinary: Negative.   Musculoskeletal: Negative.  Negative for neck pain.  Neurological: Positive for headaches.  Hematological: Negative.   Psychiatric/Behavioral: Negative.   All other systems reviewed and are negative.      Objective:   Physical Exam  Constitutional: She is oriented to person, place, and time. She appears well-developed and well-nourished. No distress.  HENT:  Head: Normocephalic and atraumatic.  Right Ear: External ear normal.  Nasal passage erythemas with mild swelling  Oropharynx erythemas     Eyes: Pupils are equal, round, and reactive to light.  Neck: Normal range of motion. Neck supple. No thyromegaly present.  Cardiovascular: Normal rate, regular rhythm, normal heart sounds and intact distal pulses.   No murmur heard. Pulmonary/Chest: Effort normal and breath sounds normal. No respiratory distress. She has no wheezes.  Abdominal: Soft. Bowel sounds are normal. She exhibits no distension. There is no tenderness.  Musculoskeletal: Normal range of motion. She exhibits edema (trace amt in left knee). She exhibits no tenderness.  Neurological: She is alert and oriented to person, place, and time. She has normal reflexes. No cranial nerve deficit.  Skin: Skin is warm and dry.  Psychiatric: She has a normal mood and affect. Her behavior is normal. Judgment and thought content normal.  Vitals reviewed.     BP 120/85 mmHg  Pulse 104  Temp(Src) 98.2 F (36.8 C) (Oral)  Ht 5\' 3"  (1.6 m)  Wt 212 lb 6.4 oz (96.344 kg)  BMI 37.63 kg/m2  LMP 05/06/2012     Assessment & Plan:  1. Sore throat - Rapid strep screen (not at [facility])  2. Acute upper respiratory infection -- Take meds as prescribed - Use a cool mist humidifier  -Use saline nose sprays frequently -Saline irrigations of the nose can be very helpful if done frequently.  * 4X daily for 1 week*  * Use of a nettie pot can be helpful with this. Follow directions with this* -Force fluids -For any cough or congestion  Use plain Mucinex- regular strength or max strength is fine   * Children- consult with Pharmacist  for dosing -For fever or aces or pains- take tylenol or ibuprofen appropriate for age and weight.  * for fevers greater than 101 orally you may alternate ibuprofen and tylenol every  3 hours. -Throat lozenges if help -New toothbrush in 3 days - azithromycin (ZITHROMAX Z-PAK) 250 MG tablet; As directed  Dispense: 1 each; Refill: 0 - predniSONE (STERAPRED UNI-PAK 21 TAB) 10 MG (21) TBPK tablet; Take 1 tablet (10  mg total) by mouth daily. As directed x 6 days  Dispense: 21 tablet; Refill: 0  3. Left knee pain Continue mobic Rest Ice  ROM exercises discussed - predniSONE (STERAPRED UNI-PAK 21 TAB) 10 MG (21) TBPK tablet; Take 1 tablet (10 mg total) by mouth daily. As directed x 6 days  Dispense: 21 tablet; Refill: 0  Evelina Dun, FNP

## 2015-11-17 NOTE — Patient Instructions (Signed)
Upper Respiratory Infection, Adult Most upper respiratory infections (URIs) are a viral infection of the air passages leading to the lungs. A URI affects the nose, throat, and upper air passages. The most common type of URI is nasopharyngitis and is typically referred to as "the common cold." URIs run their course and usually go away on their own. Most of the time, a URI does not require medical attention, but sometimes a bacterial infection in the upper airways can follow a viral infection. This is called a secondary infection. Sinus and middle ear infections are common types of secondary upper respiratory infections. Bacterial pneumonia can also complicate a URI. A URI can worsen asthma and chronic obstructive pulmonary disease (COPD). Sometimes, these complications can require emergency medical care and may be life threatening.  CAUSES Almost all URIs are caused by viruses. A virus is a type of germ and can spread from one person to another.  RISKS FACTORS You may be at risk for a URI if:   You smoke.   You have chronic heart or lung disease.  You have a weakened defense (immune) system.   You are very young or very old.   You have nasal allergies or asthma.  You work in crowded or poorly ventilated areas.  You work in health care facilities or schools. SIGNS AND SYMPTOMS  Symptoms typically develop 2-3 days after you come in contact with a cold virus. Most viral URIs last 7-10 days. However, viral URIs from the influenza virus (flu virus) can last 14-18 days and are typically more severe. Symptoms may include:   Runny or stuffy (congested) nose.   Sneezing.   Cough.   Sore throat.   Headache.   Fatigue.   Fever.   Loss of appetite.   Pain in your forehead, behind your eyes, and over your cheekbones (sinus pain).  Muscle aches.  DIAGNOSIS  Your health care provider may diagnose a URI by:  Physical exam.  Tests to check that your symptoms are not due to  another condition such as:  Strep throat.  Sinusitis.  Pneumonia.  Asthma. TREATMENT  A URI goes away on its own with time. It cannot be cured with medicines, but medicines may be prescribed or recommended to relieve symptoms. Medicines may help:  Reduce your fever.  Reduce your cough.  Relieve nasal congestion. HOME CARE INSTRUCTIONS   Take medicines only as directed by your health care provider.   Gargle warm saltwater or take cough drops to comfort your throat as directed by your health care provider.  Use a warm mist humidifier or inhale steam from a shower to increase air moisture. This may make it easier to breathe.  Drink enough fluid to keep your urine clear or pale yellow.   Eat soups and other clear broths and maintain good nutrition.   Rest as needed.   Return to work when your temperature has returned to normal or as your health care provider advises. You may need to stay home longer to avoid infecting others. You can also use a face mask and careful hand washing to prevent spread of the virus.  Increase the usage of your inhaler if you have asthma.   Do not use any tobacco products, including cigarettes, chewing tobacco, or electronic cigarettes. If you need help quitting, ask your health care provider. PREVENTION  The best way to protect yourself from getting a cold is to practice good hygiene.   Avoid oral or hand contact with people with cold   symptoms.   Wash your hands often if contact occurs.  There is no clear evidence that vitamin C, vitamin E, echinacea, or exercise reduces the chance of developing a cold. However, it is always recommended to get plenty of rest, exercise, and practice good nutrition.  SEEK MEDICAL CARE IF:   You are getting worse rather than better.   Your symptoms are not controlled by medicine.   You have chills.  You have worsening shortness of breath.  You have brown or red mucus.  You have yellow or brown nasal  discharge.  You have pain in your face, especially when you bend forward.  You have a fever.  You have swollen neck glands.  You have pain while swallowing.  You have white areas in the back of your throat. SEEK IMMEDIATE MEDICAL CARE IF:   You have severe or persistent:  Headache.  Ear pain.  Sinus pain.  Chest pain.  You have chronic lung disease and any of the following:  Wheezing.  Prolonged cough.  Coughing up blood.  A change in your usual mucus.  You have a stiff neck.  You have changes in your:  Vision.  Hearing.  Thinking.  Mood. MAKE SURE YOU:   Understand these instructions.  Will watch your condition.  Will get help right away if you are not doing well or get worse.   This information is not intended to replace advice given to you by your health care provider. Make sure you discuss any questions you have with your health care provider.   Document Released: 02/19/2001 Document Revised: 01/10/2015 Document Reviewed: 12/01/2013 Elsevier Interactive Patient Education 2016 [employer]  - Take meds as prescribed - Use a cool mist humidifier  -Use saline nose sprays frequently -Saline irrigations of the nose can be very helpful if done frequently.  * 4X daily for 1 week*  * Use of a nettie pot can be helpful with this. Follow directions with this* -Force fluids -For any cough or congestion  Use plain Mucinex- regular strength or max strength is fine   * Children- consult with Pharmacist for dosing -For fever or aces or pains- take tylenol or ibuprofen appropriate for age and weight.  * for fevers greater than 101 orally you may alternate ibuprofen and tylenol every  3 hours. -Throat lozenges if help -New toothbrush in 3 days   Greyden Besecker, FNP  

## 2015-11-22 ENCOUNTER — Ambulatory Visit: Payer: Medicaid Other | Admitting: Urology

## 2015-11-23 ENCOUNTER — Ambulatory Visit: Payer: Medicaid Other | Admitting: Family Medicine

## 2015-11-24 ENCOUNTER — Ambulatory Visit: Payer: Medicaid Other | Admitting: Family Medicine

## 2015-11-27 ENCOUNTER — Encounter: Payer: Self-pay | Admitting: Family

## 2015-11-28 ENCOUNTER — Encounter: Payer: Self-pay | Admitting: *Deleted

## 2015-12-20 ENCOUNTER — Other Ambulatory Visit: Payer: Self-pay | Admitting: Family

## 2015-12-21 ENCOUNTER — Encounter (INDEPENDENT_AMBULATORY_CARE_PROVIDER_SITE_OTHER): Payer: Self-pay

## 2015-12-21 ENCOUNTER — Ambulatory Visit (INDEPENDENT_AMBULATORY_CARE_PROVIDER_SITE_OTHER): Payer: Medicaid Other | Admitting: Family

## 2015-12-21 ENCOUNTER — Ambulatory Visit (INDEPENDENT_AMBULATORY_CARE_PROVIDER_SITE_OTHER): Payer: Medicaid Other

## 2015-12-21 ENCOUNTER — Encounter: Payer: Self-pay | Admitting: Family

## 2015-12-21 VITALS — BP 114/82 | HR 94 | Temp 97.4°F | Ht 63.0 in | Wt 215.0 lb

## 2015-12-21 DIAGNOSIS — M129 Arthropathy, unspecified: Secondary | ICD-10-CM

## 2015-12-21 DIAGNOSIS — M79644 Pain in right finger(s): Secondary | ICD-10-CM | POA: Diagnosis not present

## 2015-12-21 DIAGNOSIS — M25562 Pain in left knee: Secondary | ICD-10-CM | POA: Diagnosis not present

## 2015-12-21 DIAGNOSIS — M1710 Unilateral primary osteoarthritis, unspecified knee: Secondary | ICD-10-CM

## 2015-12-21 MED ORDER — BUPIVACAINE HCL 0.5 % IJ SOLN
1.0000 mL | Freq: Once | INTRAMUSCULAR | Status: AC
  Administered 2015-12-21: 1 mL via INTRA_ARTICULAR

## 2015-12-21 MED ORDER — SOLIFENACIN SUCCINATE 5 MG PO TABS: 5.0000 mg | ORAL_TABLET | Freq: Every day | ORAL | Status: DC

## 2015-12-21 MED ORDER — METHYLPREDNISOLONE ACETATE 40 MG/ML IJ SUSP
40.0000 mg | Freq: Once | INTRAMUSCULAR | Status: AC
  Administered 2015-12-21: 40 mg via INTRA_ARTICULAR

## 2015-12-21 NOTE — Patient Instructions (Signed)

## 2015-12-21 NOTE — Progress Notes (Signed)
   Subjective:    Patient ID: Tiffany Lynch, female    DOB: 13-Jul-1961, 55 y.o.   MRN: [id number]  Pt presents to the office today for recurrent left knee pain. Pt states she had a referral to ortho for her right thumb pain, but never followed up. Pt currently taking Norco 5-325mg  as needed for pain.   Knee Pain  The incident occurred more than 1 week ago (injuried over 10 years ago). Injury mechanism: 10 years ago, but flared up over the last few months. The pain is at a severity of 9/10. The pain is moderate. The pain has been constant since onset. Pertinent negatives include no loss of motion, numbness or tingling. She reports no foreign bodies present. Nothing aggravates the symptoms. She has tried rest and acetaminophen for the symptoms. The treatment provided mild relief.      Review of Systems  Constitutional: Negative.   HENT: Negative.   Eyes: Negative.   Respiratory: Negative.  Negative for shortness of breath.   Cardiovascular: Negative.  Negative for palpitations.  Gastrointestinal: Negative.   Endocrine: Negative.   Genitourinary: Negative.   Musculoskeletal: Negative.   Neurological: Negative.  Negative for tingling, numbness and headaches.  Hematological: Negative.   Psychiatric/Behavioral: Negative.   All other systems reviewed and are negative.      Objective:   Physical Exam  Constitutional: She is oriented to person, place, and time. She appears well-developed and well-nourished. No distress.  Eyes: Pupils are equal, round, and reactive to light.  Neck: Normal range of motion. Neck supple. No thyromegaly present.  Cardiovascular: Normal rate, regular rhythm, normal heart sounds and intact distal pulses.   No murmur heard. Pulmonary/Chest: Effort normal and breath sounds normal. No respiratory distress. She has no wheezes.  Abdominal: Soft. Bowel sounds are normal. She exhibits no distension. There is no tenderness.  Musculoskeletal: She exhibits edema (trace in  left knee) and tenderness.  Neurological: She is alert and oriented to person, place, and time.  Skin: Skin is warm and dry.  Psychiatric: She has a normal mood and affect. Her behavior is normal. Judgment and thought content normal.  Vitals reviewed.  BP 114/82 mmHg  Pulse 94  Temp(Src) 97.4 F (36.3 C) (Oral)  Ht 5\' 3"  (1.6 m)  Wt 215 lb (97.523 kg)  BMI 38.09 kg/m2  LMP 05/06/2012   Left knee- Arthritis  present Preliminary reading by Evelina Dun, FNP [facility]      Assessment & Plan:  1. Left knee pain - methylPREDNISolone acetate (DEPO-MEDROL) injection 40 mg; Inject 1 mL (40 mg total) into the articular space once. - bupivacaine (MARCAINE) 0.5 % (with pres) injection 1 mL; Inject 1 mL into the articular space once. - DG Knee 1-2 Views Left; Future  2. Arthritis of knee -Rest  -ROM exercises discussed -Ice as needed -Continue pain medication as needed -RTO prn  - methylPREDNISolone acetate (DEPO-MEDROL) injection 40 mg; Inject 1 mL (40 mg total) into the articular space once. - bupivacaine (MARCAINE) 0.5 % (with pres) injection 1 mL; Inject 1 mL into the articular space once. - DG Knee 1-2 Views Left; Future   3. Thumb pain, right - Ambulatory referral to Orthopedic Surgery  Evelina Dun, FNP

## 2015-12-25 ENCOUNTER — Telehealth: Payer: Self-pay | Admitting: Family

## 2015-12-26 ENCOUNTER — Telehealth: Payer: Self-pay | Admitting: Family

## 2015-12-26 NOTE — Telephone Encounter (Signed)
Patient of Xray results.

## 2016-01-01 ENCOUNTER — Encounter: Payer: Medicaid Other | Admitting: *Deleted

## 2016-01-12 ENCOUNTER — Ambulatory Visit: Payer: Medicaid Other | Admitting: Family

## 2016-01-15 ENCOUNTER — Ambulatory Visit: Payer: Medicaid Other | Admitting: Family

## 2016-01-16 ENCOUNTER — Encounter: Payer: Self-pay | Admitting: Family

## 2016-03-29 ENCOUNTER — Encounter (HOSPITAL_COMMUNITY): Payer: Self-pay | Admitting: Emergency Medicine

## 2016-03-29 ENCOUNTER — Emergency Department (HOSPITAL_COMMUNITY)
Admission: EM | Admit: 2016-03-29 | Discharge: 2016-03-29 | Disposition: A | Discharge status: Home / Self Care | Payer: Medicaid Other | Attending: Emergency Medicine | Admitting: Emergency Medicine

## 2016-03-29 ENCOUNTER — Emergency Department (HOSPITAL_COMMUNITY): Payer: Medicaid Other

## 2016-03-29 DIAGNOSIS — S8002XA Contusion of left knee, initial encounter: Secondary | ICD-10-CM | POA: Diagnosis not present

## 2016-03-29 DIAGNOSIS — I10 Essential (primary) hypertension: Secondary | ICD-10-CM | POA: Diagnosis not present

## 2016-03-29 DIAGNOSIS — W19XXXA Unspecified fall, initial encounter: Secondary | ICD-10-CM | POA: Diagnosis not present

## 2016-03-29 DIAGNOSIS — F17210 Nicotine dependence, cigarettes, uncomplicated: Secondary | ICD-10-CM | POA: Diagnosis not present

## 2016-03-29 DIAGNOSIS — Y929 Unspecified place or not applicable: Secondary | ICD-10-CM | POA: Diagnosis not present

## 2016-03-29 DIAGNOSIS — Y939 Activity, unspecified: Secondary | ICD-10-CM | POA: Insufficient documentation

## 2016-03-29 DIAGNOSIS — Y999 Unspecified external cause status: Secondary | ICD-10-CM | POA: Diagnosis not present

## 2016-03-29 DIAGNOSIS — S8992XA Unspecified injury of left lower leg, initial encounter: Secondary | ICD-10-CM | POA: Diagnosis present

## 2016-03-29 DIAGNOSIS — F319 Bipolar disorder, unspecified: Secondary | ICD-10-CM | POA: Diagnosis not present

## 2016-03-29 MED ORDER — HYDROCODONE-ACETAMINOPHEN 5-325 MG PO TABS
1.0000 | ORAL_TABLET | Freq: Once | ORAL | Status: AC
  Administered 2016-03-29: 1 via ORAL
  Filled 2016-03-29: qty 1

## 2016-03-29 MED ORDER — DICLOFENAC SODIUM 50 MG PO TBEC: 50.0000 mg | DELAYED_RELEASE_TABLET | Freq: Two times a day (BID) | ORAL | Status: DC

## 2016-03-29 NOTE — ED Notes (Addendum)
Patient complaining of left knee pain x 3-4 weeks. States "It's been giving out on me the last 2 weeks. I went to an orthopedic doctor 2-3 months ago and he said it was arthritis and old age." States "I don't have a primary doctor and I have an appointment August 17."

## 2016-03-29 NOTE — ED Provider Notes (Signed)
CSN: [id number]     Arrival date & time 03/29/16  1303 History   First MD Initiated Contact with Patient 03/29/16 1336     Chief Complaint  Patient presents with  . Knee Pain     (Consider location/radiation/quality/duration/timing/severity/associated sxs/prior Treatment) HPI Tiffany Lynch is a 55 y.o. female who presents to the ED with left knee pain that started approximately 3 weeks ago after a fall. She reports that the knee has been "giving out" on her she went to an orthopedic doctor about 2 months ago, before the fall,who told her she had arthritis. Patient states she called her PCP after the fall and she could not get appointment until August 17th. Patient decided to come in today due to the pain. She denies any other injuries.    Past Medical History  Diagnosis Date  . Chronic back pain   . Bipolar 1 disorder ([place])   . Anxiety   . Depression   . Hypertension   . Sleep apnea   . On home oxygen therapy     3LPM PRN  . History of anemia   . GERD (gastroesophageal reflux disease)    Past Surgical History  Procedure Laterality Date  . Dilation and curettage of uterus  age 69  . Polypectomy  05/22/2012    Procedure: POLYPECTOMY;  Surgeon: Jonnie Kind, MD;  Location: [facility];  Service: Gynecology;  Laterality: N/A;  Endometrial Polypectomy  . Tubal ligation    . Colonoscopy with propofol N/A 07/18/2014    Procedure: COLONOSCOPY WITH PROPOFOL;  Surgeon: Rogene Houston, MD;  Location: [facility];  Service: Endoscopy;  Laterality: N/A;  in cecum at 0753, withdrawal time 20 min  . Polypectomy N/A 07/18/2014    Procedure: POLYPECTOMY;  Surgeon: Rogene Houston, MD;  Location: [facility];  Service: Endoscopy;  Laterality: N/A;   Family History  Problem Relation Age of Onset  . Hypertension Mother   . Hypertension Brother   . Anxiety disorder Brother   . Depression Brother    Social History  Substance Use Topics  . Smoking status: Current Every Day Smoker -- 1.00 packs/day for 35  years    Types: Cigarettes  . Smokeless tobacco: Never Used  . Alcohol Use: Yes     Comment: occasionally   OB History    Gravida Para Term Preterm AB TAB SAB Ectopic Multiple Living   2 2 2       2      Review of Systems  Musculoskeletal: Positive for arthralgias.       Left knee pain  Skin: Positive for wound (left knee healing).      Allergies  Review of patient's allergies indicates no known allergies.  Home Medications   Prior to Admission medications   Medication Sig Start Date End Date Taking? Authorizing Provider  ALPRAZolam Duanne Moron) 1 MG tablet Take 1 mg by mouth 4 (four) times daily. Patient says she takes 3 daily    Historical Provider, MD  buPROPion (WELLBUTRIN XL) 300 MG 24 hr tablet Take 300 mg by mouth every morning.    Historical Provider, MD  busPIRone (BUSPAR) 15 MG tablet Take 15 mg by mouth 3 (three) times daily. 04/01/12   Delfina Redwood, MD  cholecalciferol (VITAMIN D) 400 UNITS TABS tablet Take 400 Units by mouth every morning. Reported on 09/18/2015    Historical Provider, MD  diclofenac (VOLTAREN) 50 MG EC tablet Take 1 tablet (50 mg total) by mouth 2 (two) times  daily. 03/29/16   Hope Bunnie Pion, NP  DULoxetine (CYMBALTA) 60 MG capsule Take 60 mg by mouth every morning.     Historical Provider, MD  hydrochlorothiazide (MICROZIDE) 12.5 MG capsule Take 1 capsule (12.5 mg total) by mouth daily. 01/02/15   Sharion Balloon, FNP  HYDROcodone-acetaminophen (NORCO/VICODIN) 5-325 MG per tablet Take 1 tablet by mouth every 4 (four) hours as needed for moderate pain (started on 12/17/2013 #15). Reported on 10/26/2015    Historical Provider, MD  meloxicam (MOBIC) 15 MG tablet TAKE 1 TABLET (15 MG TOTAL) BY MOUTH DAILY. Patient not taking: Reported on 12/21/2015 11/13/15   Sharion Balloon, FNP  Multiple Vitamin (MULTIVITAMIN WITH MINERALS) TABS tablet Take 1 tablet by mouth every morning.    Historical Provider, MD  nicotine (NICODERM CQ - DOSED IN MG/24 HOURS) 21 mg/24hr patch  PLACE 1 PATCH (21 MG TOTAL) ONTO THE SKIN DAILY. 09/27/15   Sharion Balloon, FNP  omeprazole (PRILOSEC) 20 MG capsule TAKE ONE CAPSULE BY MOUTH ONE TIME DAILY 12/21/15   Sharion Balloon, FNP  risperiDONE (RISPERDAL) 2 MG tablet Take 1 tablet (2 mg total) by mouth at bedtime. 12/11/12   Samuella Cota, MD  simvastatin (ZOCOR) 20 MG tablet Take 1 tablet (20 mg total) by mouth at bedtime. 01/02/15   Sharion Balloon, FNP  solifenacin (VESICARE) 5 MG tablet Take 1 tablet (5 mg total) by mouth daily. 12/21/15   Sharion Balloon, FNP  terbinafine (LAMISIL) 250 MG tablet Take 1 tablet (250 mg total) by mouth daily. 10/31/15   Sharion Balloon, FNP  traMADol (ULTRAM) 50 MG tablet Take 50-100 mg by mouth every 6 (six) hours as needed for moderate pain. Reported on 10/26/2015    Historical Provider, MD  traZODone (DESYREL) 100 MG tablet Take 300 mg by mouth at bedtime.    Historical Provider, MD  Vitamin D, Ergocalciferol, (DRISDOL) 50000 units CAPS capsule Take 1 capsule (50,000 Units total) by mouth every 7 (seven) days. 11/02/15   Christy A Hawks, FNP   BP 145/99 mmHg  Pulse 91  Temp(Src) 98.4 F (36.9 C) (Oral)  Resp 18  Ht 5\' 3"  (1.6 m)  Wt 97.07 kg  BMI 37.92 kg/m2  SpO2 97%  LMP 05/06/2012 Physical Exam  Constitutional: She is oriented to person, place, and time. She appears well-developed and well-nourished. No distress.  HENT:  Head: Normocephalic.  Eyes: EOM are normal.  Neck: Neck supple.  Cardiovascular: Normal rate.   Pulmonary/Chest: Effort normal.  Musculoskeletal:       Left knee: She exhibits abnormal patellar mobility. She exhibits normal range of motion and no swelling. Tenderness found. MCL tenderness noted.  Pedal pulses 2+, adequate circulation, full passive range of motion of the left knee with minimal pain. Pain with palpation over the MCL.   Neurological: She is alert and oriented to person, place, and time. No cranial nerve deficit.  Skin: Skin is warm and dry.  Psychiatric:  She has a normal mood and affect. Her behavior is normal.  Nursing note and vitals reviewed.   ED Course  Procedures (including critical care time) Labs Review Labs Reviewed - No data to display  Imaging Review Dg Knee Complete 4 Views Left  03/29/2016  CLINICAL DATA:  Left knee pain, recent fall EXAM: LEFT KNEE - COMPLETE 4+ VIEW COMPARISON:  12/21/2015 FINDINGS: No fracture or dislocation is seen. The joint spaces are preserved. The visualized soft tissues are unremarkable. Moderate suprapatellar knee joint effusion. IMPRESSION:  No fracture or dislocation is seen. Moderate suprapatellar knee joint effusion. Electronically Signed   By: Julian Hy M.D.   On: 03/29/2016 13:42     MDM  55 y.o. female with left knee pain that has been chronic but increased a few weeks ago after a fall. Stable for d/c without focal neuro deficits. Knee immobilizer, crutches, ice, elevation and NSAIDS. Patient to f/u with ortho. She will return here as needed for problems.   Final diagnoses:  Knee contusion, left, initial encounter       [facility], NP 03/29/16 1412  Milton Ferguson, MD 03/30/16 1320

## 2016-05-31 ENCOUNTER — Emergency Department (HOSPITAL_COMMUNITY): Payer: Medicaid Other

## 2016-05-31 ENCOUNTER — Encounter (HOSPITAL_COMMUNITY): Payer: Self-pay | Admitting: Cardiology

## 2016-05-31 ENCOUNTER — Observation Stay (HOSPITAL_COMMUNITY)
Admission: EM | Admit: 2016-05-31 | Discharge: 2016-06-01 | Disposition: A | Discharge status: Home / Self Care | Payer: Medicaid Other | Attending: Internal Medicine | Admitting: Internal Medicine

## 2016-05-31 DIAGNOSIS — R55 Syncope and collapse: Secondary | ICD-10-CM | POA: Diagnosis present

## 2016-05-31 DIAGNOSIS — J449 Chronic obstructive pulmonary disease, unspecified: Secondary | ICD-10-CM | POA: Insufficient documentation

## 2016-05-31 DIAGNOSIS — E876 Hypokalemia: Secondary | ICD-10-CM | POA: Diagnosis present

## 2016-05-31 DIAGNOSIS — Z79899 Other long term (current) drug therapy: Secondary | ICD-10-CM | POA: Insufficient documentation

## 2016-05-31 DIAGNOSIS — I10 Essential (primary) hypertension: Secondary | ICD-10-CM | POA: Diagnosis not present

## 2016-05-31 DIAGNOSIS — N179 Acute kidney failure, unspecified: Secondary | ICD-10-CM | POA: Diagnosis present

## 2016-05-31 DIAGNOSIS — R531 Weakness: Secondary | ICD-10-CM | POA: Diagnosis present

## 2016-05-31 DIAGNOSIS — N390 Urinary tract infection, site not specified: Secondary | ICD-10-CM | POA: Diagnosis not present

## 2016-05-31 DIAGNOSIS — F17210 Nicotine dependence, cigarettes, uncomplicated: Secondary | ICD-10-CM | POA: Diagnosis not present

## 2016-05-31 LAB — CBC WITH DIFFERENTIAL/PLATELET
Basophils Absolute: 0 10*3/uL (ref 0.0–0.1)
Basophils Relative: 0 %
Eosinophils Absolute: 0.1 10*3/uL (ref 0.0–0.7)
Eosinophils Relative: 1 %
HCT: 38.2 % (ref 36.0–46.0)
Hemoglobin: 13.1 g/dL (ref 12.0–15.0)
Lymphocytes Relative: 25 %
Lymphs Abs: 3.1 10*3/uL (ref 0.7–4.0)
MCH: 30.7 pg (ref 26.0–34.0)
MCHC: 34.3 g/dL (ref 30.0–36.0)
MCV: 89.5 fL (ref 78.0–100.0)
Monocytes Absolute: 1 10*3/uL (ref 0.1–1.0)
Monocytes Relative: 8 %
Neutro Abs: 7.9 10*3/uL — ABNORMAL HIGH (ref 1.7–7.7)
Neutrophils Relative %: 65 %
Platelets: 262 10*3/uL (ref 150–400)
RBC: 4.27 MIL/uL (ref 3.87–5.11)
RDW: 14.3 % (ref 11.5–15.5)
WBC: 12.1 10*3/uL — ABNORMAL HIGH (ref 4.0–10.5)

## 2016-05-31 LAB — COMPREHENSIVE METABOLIC PANEL
ALT: 12 U/L — ABNORMAL LOW (ref 14–54)
AST: 13 U/L — ABNORMAL LOW (ref 15–41)
Albumin: 3.5 g/dL (ref 3.5–5.0)
Alkaline Phosphatase: 77 U/L (ref 38–126)
Anion gap: 11 (ref 5–15)
BUN: 16 mg/dL (ref 6–20)
CO2: 29 mmol/L (ref 22–32)
Calcium: 8.5 mg/dL — ABNORMAL LOW (ref 8.9–10.3)
Chloride: 98 mmol/L — ABNORMAL LOW (ref 101–111)
Creatinine, Ser: 1.38 mg/dL — ABNORMAL HIGH (ref 0.44–1.00)
GFR calc Af Amer: 49 mL/min — ABNORMAL LOW (ref >60)
GFR calc non Af Amer: 42 mL/min — ABNORMAL LOW (ref >60)
Glucose, Bld: 105 mg/dL — ABNORMAL HIGH (ref 65–99)
Potassium: 2.5 mmol/L — CL (ref 3.5–5.1)
Sodium: 138 mmol/L (ref 135–145)
Total Bilirubin: 0.5 mg/dL (ref 0.3–1.2)
Total Protein: 6 g/dL — ABNORMAL LOW (ref 6.5–8.1)

## 2016-05-31 LAB — URINE MICROSCOPIC-ADD ON

## 2016-05-31 LAB — URINALYSIS, ROUTINE W REFLEX MICROSCOPIC
Bilirubin Urine: NEGATIVE
Glucose, UA: NEGATIVE mg/dL
Hgb urine dipstick: NEGATIVE
Ketones, ur: NEGATIVE mg/dL
Nitrite: NEGATIVE
Protein, ur: NEGATIVE mg/dL
Specific Gravity, Urine: <1.005 — ABNORMAL LOW (ref 1.005–1.030)
pH: 6.5 (ref 5.0–8.0)

## 2016-05-31 LAB — I-STAT CHEM 8, ED
BUN: 16 mg/dL (ref 6–20)
Calcium, Ion: 1.1 mmol/L — ABNORMAL LOW (ref 1.15–1.40)
Chloride: 94 mmol/L — ABNORMAL LOW (ref 101–111)
Creatinine, Ser: 1.4 mg/dL — ABNORMAL HIGH (ref 0.44–1.00)
Glucose, Bld: 104 mg/dL — ABNORMAL HIGH (ref 65–99)
HCT: 39 % (ref 36.0–46.0)
Hemoglobin: 13.3 g/dL (ref 12.0–15.0)
Potassium: 2.6 mmol/L — CL (ref 3.5–5.1)
Sodium: 140 mmol/L (ref 135–145)
TCO2: 32 mmol/L (ref 0–100)

## 2016-05-31 LAB — I-STAT CG4 LACTIC ACID, ED
Lactic Acid, Venous: 1.37 mmol/L (ref 0.5–1.9)
Lactic Acid, Venous: 0.85 mmol/L (ref 0.5–1.9)

## 2016-05-31 MED ORDER — PANTOPRAZOLE SODIUM 40 MG PO TBEC
40.0000 mg | DELAYED_RELEASE_TABLET | Freq: Every day | ORAL | Status: DC
  Administered 2016-06-01: 40 mg via ORAL
  Filled 2016-05-31: qty 1

## 2016-05-31 MED ORDER — NALOXONE HCL 2 MG/2ML IJ SOSY
1.0000 mg | PREFILLED_SYRINGE | Freq: Once | INTRAMUSCULAR | Status: AC
Start: 2016-05-31 — End: 2016-05-31
  Administered 2016-05-31: 1 mg via INTRAVENOUS
  Filled 2016-05-31: qty 2

## 2016-05-31 MED ORDER — POTASSIUM CHLORIDE 10 MEQ/100ML IV SOLN
10.0000 meq | Freq: Once | INTRAVENOUS | Status: AC
  Administered 2016-05-31: 10 meq via INTRAVENOUS
  Filled 2016-05-31: qty 100

## 2016-05-31 MED ORDER — HYDROCODONE-ACETAMINOPHEN 5-325 MG PO TABS
1.0000 | ORAL_TABLET | ORAL | Status: DC | PRN
  Administered 2016-06-01 (×2): 1 via ORAL
  Filled 2016-05-31 (×2): qty 1

## 2016-05-31 MED ORDER — RISPERIDONE 1 MG PO TABS
2.0000 mg | ORAL_TABLET | Freq: Every day | ORAL | Status: DC
  Administered 2016-06-01: 2 mg via ORAL
  Filled 2016-05-31: qty 2

## 2016-05-31 MED ORDER — SODIUM CHLORIDE 0.9 % IV BOLUS (SEPSIS)
1000.0000 mL | Freq: Once | INTRAVENOUS | Status: AC
  Administered 2016-05-31: 1000 mL via INTRAVENOUS

## 2016-05-31 MED ORDER — ACETAMINOPHEN 650 MG RE SUPP: 650.0000 mg | Freq: Four times a day (QID) | RECTAL | Status: DC | PRN

## 2016-05-31 MED ORDER — SODIUM CHLORIDE 0.9 % IV SOLN
INTRAVENOUS | Status: DC
  Administered 2016-06-01 (×2): via INTRAVENOUS

## 2016-05-31 MED ORDER — BUPROPION HCL ER (XL) 300 MG PO TB24
300.0000 mg | ORAL_TABLET | Freq: Every morning | ORAL | Status: DC
  Administered 2016-06-01: 300 mg via ORAL
  Filled 2016-05-31 (×3): qty 1

## 2016-05-31 MED ORDER — SIMVASTATIN 20 MG PO TABS
20.0000 mg | ORAL_TABLET | Freq: Every day | ORAL | Status: DC
  Administered 2016-06-01: 20 mg via ORAL
  Filled 2016-05-31: qty 1

## 2016-05-31 MED ORDER — CEFTRIAXONE SODIUM 1 G IJ SOLR
1.0000 g | Freq: Once | INTRAMUSCULAR | Status: AC
  Administered 2016-05-31: 1 g via INTRAVENOUS
  Filled 2016-05-31: qty 10

## 2016-05-31 MED ORDER — BUSPIRONE HCL 5 MG PO TABS
15.0000 mg | ORAL_TABLET | Freq: Three times a day (TID) | ORAL | Status: DC
  Administered 2016-06-01 (×2): 15 mg via ORAL
  Filled 2016-05-31 (×2): qty 3

## 2016-05-31 MED ORDER — ENOXAPARIN SODIUM 40 MG/0.4ML ~~LOC~~ SOLN
40.0000 mg | SUBCUTANEOUS | Status: DC
  Administered 2016-06-01: 40 mg via SUBCUTANEOUS
  Filled 2016-05-31: qty 0.4

## 2016-05-31 MED ORDER — DULOXETINE HCL 60 MG PO CPEP
60.0000 mg | ORAL_CAPSULE | ORAL | Status: DC
  Administered 2016-06-01: 60 mg via ORAL
  Filled 2016-05-31: qty 1

## 2016-05-31 MED ORDER — ALPRAZOLAM 1 MG PO TABS
1.0000 mg | ORAL_TABLET | Freq: Four times a day (QID) | ORAL | Status: DC | PRN
  Administered 2016-06-01 (×2): 1 mg via ORAL
  Filled 2016-05-31 (×2): qty 1

## 2016-05-31 MED ORDER — POTASSIUM CHLORIDE 10 MEQ/100ML IV SOLN
10.0000 meq | INTRAVENOUS | Status: AC
  Administered 2016-06-01 (×3): 10 meq via INTRAVENOUS
  Filled 2016-05-31 (×3): qty 100

## 2016-05-31 MED ORDER — POTASSIUM CHLORIDE CRYS ER 20 MEQ PO TBCR
40.0000 meq | EXTENDED_RELEASE_TABLET | Freq: Once | ORAL | Status: AC
  Administered 2016-05-31: 40 meq via ORAL
  Filled 2016-05-31: qty 2

## 2016-05-31 MED ORDER — ACETAMINOPHEN 325 MG PO TABS: 650.0000 mg | ORAL_TABLET | Freq: Four times a day (QID) | ORAL | Status: DC | PRN

## 2016-05-31 NOTE — ED Notes (Signed)
CRITICAL VALUE ALERT  Critical value received:  Potassium 2.5  Date of notification:  05/31/16  Time of notification:  1829  Critical value read back:Yes.    Nurse who received alert:  RMinter, RN  MD notified (1st page):  Dr. Roderic Palau  Time of first page:  1829  MD notified (2nd page):  Time of second page:  Responding MD:  Dr. Roderic Palau  Time MD responded:  [phone]

## 2016-05-31 NOTE — ED Provider Notes (Signed)
[facility] DEPT Provider Note   CSN: [id number] Arrival date & time: 05/31/16  1637     History   Chief Complaint Chief Complaint  Patient presents with  . Hypotension    HPI Tiffany Lynch is a 55 y.o. female.  Patient complains of weakness today.    Weakness  Primary symptoms include no focal weakness. This is a new problem. The current episode started 12 to 24 hours ago. The problem has not changed since onset.There was no focality noted. There has been no fever. Pertinent negatives include no shortness of breath, no chest pain and no headaches. There were no medications administered prior to arrival. Associated medical issues do not include trauma.    Past Medical History:  Diagnosis Date  . Anxiety   . Bipolar 1 disorder ([place])   . Chronic back pain   . Depression   . GERD (gastroesophageal reflux disease)   . History of anemia   . Hypertension   . On home oxygen therapy    3LPM PRN  . Sleep apnea     Patient Active Problem List   Diagnosis Date Noted  . Hypokalemia 05/31/2016  . Current smoker 07/20/2015  . Urge and stress incontinence 07/14/2015  . Hyperlipidemia 01/02/2015  . Insomnia 01/02/2015  . OAB (overactive bladder) 01/02/2015  . Depression 01/02/2015  . GERD (gastroesophageal reflux disease) 01/02/2015  . ARF (acute renal failure) ([place]) 12/21/2013  . COPD (chronic obstructive pulmonary disease) ([place]) 05/22/2012  . Dysfunctional uterine bleeding 04/01/2012  . Orthostatic hypotension 03/31/2012  . Encephalopathy acute 03/30/2012  . Chronic back pain   . Bipolar 1 disorder ([place])   . Anxiety     Past Surgical History:  Procedure Laterality Date  . COLONOSCOPY WITH PROPOFOL N/A 07/18/2014   Procedure: COLONOSCOPY WITH PROPOFOL;  Surgeon: Rogene Houston, MD;  Location: [facility];  Service: Endoscopy;  Laterality: N/A;  in cecum at 0753, withdrawal time 20 min  . DILATION AND CURETTAGE OF UTERUS  age 65  . POLYPECTOMY  05/22/2012   Procedure:  POLYPECTOMY;  Surgeon: Jonnie Kind, MD;  Location: [facility];  Service: Gynecology;  Laterality: N/A;  Endometrial Polypectomy  . POLYPECTOMY N/A 07/18/2014   Procedure: POLYPECTOMY;  Surgeon: Rogene Houston, MD;  Location: [facility];  Service: Endoscopy;  Laterality: N/A;  . TUBAL LIGATION      OB History    Gravida Para Term Preterm AB Living   2 2 2     2    SAB TAB Ectopic Multiple Live Births                   Home Medications    Prior to Admission medications   Medication Sig Start Date End Date Taking? Authorizing Provider  ALPRAZolam Duanne Moron) 1 MG tablet Take 1 mg by mouth 4 (four) times daily as needed for anxiety.    Yes Historical Provider, MD  Buprenorphine 15 MCG/HR PTWK Place 1 patch onto the skin every Friday. 02/29/16  Yes Historical Provider, MD  buPROPion (WELLBUTRIN XL) 300 MG 24 hr tablet Take 300 mg by mouth every morning.   Yes Historical Provider, MD  busPIRone (BUSPAR) 15 MG tablet Take 15 mg by mouth 3 (three) times daily. 04/01/12  Yes Delfina Redwood, MD  cyclobenzaprine (FLEXERIL) 10 MG tablet Take 10 mg by mouth 3 (three) times daily as needed for muscle spasms.  05/19/16  Yes Historical Provider, MD  DULoxetine (CYMBALTA) 60 MG capsule Take 60 mg by  mouth every morning.    Yes Historical Provider, MD  fluticasone (FLONASE) 50 MCG/ACT nasal spray use one spray in nare daily as needed for allergies/congestion   Yes Historical Provider, MD  HYDROcodone-acetaminophen (NORCO/VICODIN) 5-325 MG per tablet Take 1 tablet by mouth every 4 (four) hours as needed for moderate pain (started on 12/17/2013 #15). Reported on 10/26/2015   Yes Historical Provider, MD  lisinopril-hydrochlorothiazide (PRINZIDE,ZESTORETIC) 10-12.5 MG tablet Take 1 tablet by mouth daily. 05/23/16  Yes Historical Provider, MD  LYRICA 100 MG capsule Take 100 mg by mouth 3 (three) times daily. 05/21/16  Yes Historical Provider, MD  omeprazole (PRILOSEC) 20 MG capsule TAKE ONE CAPSULE BY MOUTH ONE TIME DAILY  12/21/15  Yes Sharion Balloon, FNP  risperiDONE (RISPERDAL) 2 MG tablet Take 1 tablet (2 mg total) by mouth at bedtime. 12/11/12  Yes Samuella Cota, MD  simvastatin (ZOCOR) 20 MG tablet Take 1 tablet (20 mg total) by mouth at bedtime. 01/02/15  Yes Sharion Balloon, FNP  solifenacin (VESICARE) 5 MG tablet Take 1 tablet (5 mg total) by mouth daily. 12/21/15  Yes Sharion Balloon, FNP  traMADol (ULTRAM) 50 MG tablet Take 50-100 mg by mouth every 6 (six) hours as needed for moderate pain. Reported on 10/26/2015   Yes Historical Provider, MD  traZODone (DESYREL) 100 MG tablet Take 300 mg by mouth at bedtime.   Yes Historical Provider, MD  VOLTAREN 1 % GEL APPLY TO AFFECTED AREA 4 TIMES DAILY AS NEEDED 05/20/16  Yes Historical Provider, MD  diclofenac (VOLTAREN) 50 MG EC tablet Take 1 tablet (50 mg total) by mouth 2 (two) times daily. Patient not taking: Reported on 05/31/2016 03/29/16   Ashley Murrain, NP    Family History Family History  Problem Relation Age of Onset  . Hypertension Mother   . Hypertension Brother   . Anxiety disorder Brother   . Depression Brother     Social History Social History  Substance Use Topics  . Smoking status: Current Every Day Smoker    Packs/day: 1.00    Years: 35.00    Types: Cigarettes  . Smokeless tobacco: Never Used  . Alcohol use Yes     Comment: occasionally     Allergies   Review of patient's allergies indicates no known allergies.   Review of Systems Review of Systems  Constitutional: Negative for appetite change and fatigue.  HENT: Negative for congestion, ear discharge and sinus pressure.   Eyes: Negative for discharge.  Respiratory: Negative for cough and shortness of breath.   Cardiovascular: Negative for chest pain.  Gastrointestinal: Negative for abdominal pain and diarrhea.  Genitourinary: Negative for frequency and hematuria.  Musculoskeletal: Negative for back pain.  Skin: Negative for rash.  Neurological: Positive for weakness.  Negative for focal weakness, seizures and headaches.  Psychiatric/Behavioral: Negative for hallucinations.     Physical Exam Updated Vital Signs BP 104/74   Pulse 80   Temp 97.7 F (36.5 C) (Oral)   Resp 20   Ht 5\' 3"  (1.6 m)   Wt 206 lb (93.4 kg)   LMP 05/06/2012   SpO2 95%   BMI 36.49 kg/m   Physical Exam  Constitutional: She is oriented to person, place, and time. She appears well-developed.  HENT:  Head: Normocephalic.  Eyes: Conjunctivae and EOM are normal. No scleral icterus.  Neck: Neck supple. No thyromegaly present.  Cardiovascular: Normal rate and regular rhythm.  Exam reveals no gallop and no friction rub.   No murmur  heard. Pulmonary/Chest: No stridor. She has no wheezes. She has no rales. She exhibits no tenderness.  Abdominal: She exhibits no distension. There is no tenderness. There is no rebound.  Musculoskeletal: Normal range of motion. She exhibits no edema.  Lymphadenopathy:    She has no cervical adenopathy.  Neurological: She is oriented to person, place, and time. She exhibits normal muscle tone. Coordination normal.  Skin: No rash noted. No erythema.  Psychiatric: She has a normal mood and affect. Her behavior is normal.     ED Treatments / Results  Labs (all labs ordered are listed, but only abnormal results are displayed) Labs Reviewed  CBC WITH DIFFERENTIAL/PLATELET - Abnormal; Notable for the following:       Result Value   WBC 12.1 (*)    Neutro Abs 7.9 (*)    All other components within normal limits  COMPREHENSIVE METABOLIC PANEL - Abnormal; Notable for the following:    Potassium 2.5 (*)    Chloride 98 (*)    Glucose, Bld 105 (*)    Creatinine, Ser 1.38 (*)    Calcium 8.5 (*)    Total Protein 6.0 (*)    AST 13 (*)    ALT 12 (*)    GFR calc non Af Amer 42 (*)    GFR calc Af Amer 49 (*)    All other components within normal limits  URINALYSIS, ROUTINE W REFLEX MICROSCOPIC (NOT AT [facility]) - Abnormal; Notable for the following:     Specific Gravity, Urine <1.005 (*)    Leukocytes, UA SMALL (*)    All other components within normal limits  URINE MICROSCOPIC-ADD ON - Abnormal; Notable for the following:    Squamous Epithelial / LPF 6-30 (*)    Bacteria, UA MANY (*)    All other components within normal limits  I-STAT CHEM 8, ED - Abnormal; Notable for the following:    Potassium 2.6 (*)    Chloride 94 (*)    Creatinine, Ser 1.40 (*)    Glucose, Bld 104 (*)    Calcium, Ion 1.10 (*)    All other components within normal limits  URINE CULTURE  I-STAT CG4 LACTIC ACID, ED  I-STAT CG4 LACTIC ACID, ED    EKG  EKG Interpretation  Date/Time:  Friday May 31 2016 16:51:36 EDT Ventricular Rate:  82 PR Interval:    QRS Duration: 112 QT Interval:  430 QTC Calculation: 503 R Axis:   56 Text Interpretation:  Sinus rhythm Incomplete right bundle branch block Low voltage, precordial leads Borderline prolonged QT interval Confirmed by Shery Wauneka  MD, Alby Schwabe [phone]) on 05/31/2016 9:42:55 PM       Radiology Dg Chest Portable 1 View  Result Date: 05/31/2016 CLINICAL DATA:  Lethargy/altered mental status EXAM: PORTABLE CHEST 1 VIEW COMPARISON:  December 21, 2013 FINDINGS: There is no edema or consolidation. Heart is borderline enlarged with pulmonary vascularity within normal limits. No adenopathy. No bone lesions. IMPRESSION: No edema or consolidation.  Borderline cardiac enlargement. Electronically Signed   By: Lowella Grip III M.D.   On: 05/31/2016 17:43    Procedures Procedures (including critical care time)  Medications Ordered in ED Medications  sodium chloride 0.9 % bolus 1,000 mL (0 mLs Intravenous Stopped 05/31/16 1829)  sodium chloride 0.9 % bolus 1,000 mL (0 mLs Intravenous Stopped 05/31/16 1820)  naloxone (NARCAN) injection 1 mg (1 mg Intravenous Given 05/31/16 1725)  potassium chloride SA (K-DUR,KLOR-CON) CR tablet 40 mEq (40 mEq Oral Given 05/31/16 1836)  potassium  chloride 10 mEq in 100 mL IVPB (0 mEq  Intravenous Stopped 05/31/16 2047)  sodium chloride 0.9 % bolus 1,000 mL (1,000 mLs Intravenous New Bag/Given 05/31/16 2048)  cefTRIAXone (ROCEPHIN) 1 g in dextrose 5 % 50 mL IVPB (1 g Intravenous New Bag/Given 05/31/16 2107)     Initial Impression / Assessment and Plan / ED Course  I have reviewed the triage vital signs and the nursing notes.  Pertinent labs & imaging results that were available during my care of the patient were reviewed by me and considered in my medical decision making (see chart for details).  Clinical Course   Patient's hypotension improved with fluids. Labs show patient have a UTI and hypokalemia. She will be admitted for observation to be given fluids antibiotics and potassium.  Final Clinical Impressions(s) / ED Diagnoses   Final diagnoses:  UTI (lower urinary tract infection)  Hypokalemia    New Prescriptions New Prescriptions   No medications on file     Milton Ferguson, MD 05/31/16 2204

## 2016-05-31 NOTE — ED Triage Notes (Signed)
EMS found pt laying on couch.  Pt lethargic.  B/p 64/48.  Pt just placed on metoprolol.  EMS gave 2 mg narcan and 4 mg Zofran.  No response to the narcan.  C/o feeling weak and feeling like she is going to pass out last couple of days.   Gave 300 cc bolus and b/p 94/56

## 2016-05-31 NOTE — ED Notes (Signed)
Dr. Roderic Palau agreed that we do not do ortho vs until patient receives fluid bolus due to b/p low when she arrived.

## 2016-05-31 NOTE — ED Notes (Signed)
Patient ambulated to bathroom without difficulty with assistance of one.

## 2016-05-31 NOTE — H&P (Addendum)
TRH H&P   Patient Demographics:    Tiffany Lynch, is a 55 y.o. female  MRN: [id number]  DOB - [birth year]  Admit Date - 05/31/2016  Outpatient Primary MD for the patient is [employer] [facility]  Referring MD/NP/PA: Dr. Roderic Palau  Patient coming from: home  Chief Complaint  Patient presents with  . Hypotension      HPI:    Tiffany Lynch  is a 55 y.o. female, *With history of hypertension, bipolar disorder, depression who was brought to the ED for generalized weakness, fatigue. Patient was recently started on medication for hypertension at community clinic, and since then has been feeling dizzy. This morning patient says that she was about to pass out. EMS was called and found patient lying on the couch at that time blood pressure was 60/48, and was given 30 0 mL bolus with improvement of blood pressure 94/56. She does complain of mild suprapubic pain, but no dysuria urgency or frequency of urination. UA was unremarkable in the ED  In the ED level reviewed potassium 2.6, creatinine 1.40. Patient home meds include lisinopril/hydrochlorothiazide 10/12.5 mg.    Review of systems:    In addition to the HPI above,  No Fever-chills, No Headache, No changes with Vision or hearing, No problems swallowing food or Liquids, No Chest pain, Cough or Shortness of Breath, No Abdominal pain, No Nausea or Vomiting, bowel movements are regular No Blood in stool or Urine, No dysuria, .  A full 10 point Review of Systems was done, except as stated above, all other Review of Systems were negative.   With Past History of the following :    Past Medical History:  Diagnosis Date  . Anxiety   . Bipolar 1 disorder ([place])   . Chronic back pain   . Depression   . GERD (gastroesophageal reflux disease)   . History of anemia   . Hypertension   . On home oxygen therapy    3LPM PRN  . Sleep apnea       Past  Surgical History:  Procedure Laterality Date  . COLONOSCOPY WITH PROPOFOL N/A 07/18/2014   Procedure: COLONOSCOPY WITH PROPOFOL;  Surgeon: Rogene Houston, MD;  Location: [facility];  Service: Endoscopy;  Laterality: N/A;  in cecum at 0753, withdrawal time 20 min  . DILATION AND CURETTAGE OF UTERUS  age 60  . POLYPECTOMY  05/22/2012   Procedure: POLYPECTOMY;  Surgeon: Jonnie Kind, MD;  Location: [facility];  Service: Gynecology;  Laterality: N/A;  Endometrial Polypectomy  . POLYPECTOMY N/A 07/18/2014   Procedure: POLYPECTOMY;  Surgeon: Rogene Houston, MD;  Location: [facility];  Service: Endoscopy;  Laterality: N/A;  . TUBAL LIGATION        Social History:     Social History  Substance Use Topics  . Smoking status: Current Every Day Smoker    Packs/day: 1.00    Years: 35.00    Types: Cigarettes  . Smokeless tobacco: Never Used  . Alcohol use Yes  Comment: occasionally        Family History :     Family History  Problem Relation Age of Onset  . Hypertension Mother   . Hypertension Brother   . Anxiety disorder Brother   . Depression Brother       Home Medications:   Prior to Admission medications   Medication Sig Start Date End Date Taking? Authorizing Provider  ALPRAZolam Duanne Moron) 1 MG tablet Take 1 mg by mouth 4 (four) times daily as needed for anxiety.    Yes Historical Provider, MD  Buprenorphine 15 MCG/HR PTWK Place 1 patch onto the skin every Friday. 02/29/16  Yes Historical Provider, MD  buPROPion (WELLBUTRIN XL) 300 MG 24 hr tablet Take 300 mg by mouth every morning.   Yes Historical Provider, MD  busPIRone (BUSPAR) 15 MG tablet Take 15 mg by mouth 3 (three) times daily. 04/01/12  Yes Delfina Redwood, MD  cyclobenzaprine (FLEXERIL) 10 MG tablet Take 10 mg by mouth 3 (three) times daily as needed for muscle spasms.  05/19/16  Yes Historical Provider, MD  DULoxetine (CYMBALTA) 60 MG capsule Take 60 mg by mouth every morning.    Yes Historical Provider, MD  fluticasone  (FLONASE) 50 MCG/ACT nasal spray use one spray in nare daily as needed for allergies/congestion   Yes Historical Provider, MD  HYDROcodone-acetaminophen (NORCO/VICODIN) 5-325 MG per tablet Take 1 tablet by mouth every 4 (four) hours as needed for moderate pain (started on 12/17/2013 #15). Reported on 10/26/2015   Yes Historical Provider, MD  lisinopril-hydrochlorothiazide (PRINZIDE,ZESTORETIC) 10-12.5 MG tablet Take 1 tablet by mouth daily. 05/23/16  Yes Historical Provider, MD  LYRICA 100 MG capsule Take 100 mg by mouth 3 (three) times daily. 05/21/16  Yes Historical Provider, MD  omeprazole (PRILOSEC) 20 MG capsule TAKE ONE CAPSULE BY MOUTH ONE TIME DAILY 12/21/15  Yes Sharion Balloon, FNP  risperiDONE (RISPERDAL) 2 MG tablet Take 1 tablet (2 mg total) by mouth at bedtime. 12/11/12  Yes Samuella Cota, MD  simvastatin (ZOCOR) 20 MG tablet Take 1 tablet (20 mg total) by mouth at bedtime. 01/02/15  Yes Sharion Balloon, FNP  solifenacin (VESICARE) 5 MG tablet Take 1 tablet (5 mg total) by mouth daily. 12/21/15  Yes Sharion Balloon, FNP  traMADol (ULTRAM) 50 MG tablet Take 50-100 mg by mouth every 6 (six) hours as needed for moderate pain. Reported on 10/26/2015   Yes Historical Provider, MD  traZODone (DESYREL) 100 MG tablet Take 300 mg by mouth at bedtime.   Yes Historical Provider, MD  VOLTAREN 1 % GEL APPLY TO AFFECTED AREA 4 TIMES DAILY AS NEEDED 05/20/16  Yes Historical Provider, MD  diclofenac (VOLTAREN) 50 MG EC tablet Take 1 tablet (50 mg total) by mouth 2 (two) times daily. Patient not taking: Reported on 05/31/2016 03/29/16   Ashley Murrain, NP     Allergies:    No Known Allergies   Physical Exam:   Vitals  Blood pressure 94/68, pulse 75, temperature 97.7 F (36.5 C), temperature source Oral, resp. rate 21, height 5\' 3"  (1.6 m), weight 93.4 kg (206 lb), last menstrual period 05/06/2012, SpO2 96 %.  1.  General Appears in no acute distress  2. Psychiatric: Intact judgement and  insight, awake  alert, oriented x 3.  3. Neurologic:No focal neurological deficits, all cranial nerves intact. Strength 5/5 all 4 extremities, sensation intact all 4 extremities, plantars down going.  4. Eyes show anicteric sclerae, moist conjunctivae with no lid lag.  PERRLA.  5. ENMT: Oropharynx clear with moist mucous membranes and good dentition  5. Neck: supple, no cervical lymphadenopathy appriciated, No thyromegaly  6. Respiratory :Normal respiratory effort, good air movement bilaterally, clear to auscultation bilaterally.  7. Cardiovascular :RRR, no gallops, rubs or murmurs, no leg edema  8. GI: Positive bowel sounds, abdomen soft, no tenderness to palpation, no organomegaly appriciated,no rebound -guarding or rigidity.  9. Skin: No cyanosis, normal texture and turgor, no rash, lesions or ulcers  10.Musculoskeletal: Good muscle tone,  joints appear normal , no effusions, normal range of motion         Data Review:    CBC  Recent Labs Lab 05/31/16 1716 05/31/16 1728  WBC 12.1*  --   HGB 13.1 13.3  HCT 38.2 39.0  PLT 262  --   MCV 89.5  --   MCH 30.7  --   MCHC 34.3  --   RDW 14.3  --   LYMPHSABS 3.1  --   MONOABS 1.0  --   EOSABS 0.1  --   BASOSABS 0.0  --    ------------------------------------------------------------------------------------------------------------------  Chemistries   Recent Labs Lab 05/31/16 1716 05/31/16 1728  NA 138 140  K 2.5* 2.6*  CL 98* 94*  CO2 29  --   GLUCOSE 105* 104*  BUN 16 16  CREATININE 1.38* 1.40*  CALCIUM 8.5*  --   AST 13*  --   ALT 12*  --   ALKPHOS 77  --   BILITOT 0.5  --    ------------------------------------------------------------------------------------------------------------------  ------------------------------------------------------------------------------------------------------------------ GFR: Estimated Creatinine Clearance: 49.3 mL/min (by C-G formula based on SCr of 1.4 mg/dL (H)). Liver Function  Tests:  Recent Labs Lab 05/31/16 1716  AST 13*  ALT 12*  ALKPHOS 77  BILITOT 0.5  PROT 6.0*  ALBUMIN 3.5    --------------------------------------------------------------------------------------------------------------- Urine analysis:    Component Value Date/Time   COLORURINE YELLOW 05/31/2016 [place] 05/31/2016 1840   LABSPEC <1.005 (L) 05/31/2016 1840   PHURINE 6.5 05/31/2016 1840   GLUCOSEU NEGATIVE 05/31/2016 1840   HGBUR NEGATIVE 05/31/2016 1840   BILIRUBINUR NEGATIVE 05/31/2016 1840   BILIRUBINUR negative 10/31/2015 1017   KETONESUR NEGATIVE 05/31/2016 1840   PROTEINUR NEGATIVE 05/31/2016 1840   UROBILINOGEN negative 10/31/2015 1017   UROBILINOGEN 0.2 12/28/2013 0101   NITRITE NEGATIVE 05/31/2016 1840   LEUKOCYTESUR SMALL (A) 05/31/2016 1840      ----------------------------------------------------------------------------------------------------------------   Imaging Results:    Dg Chest Portable 1 View  Result Date: 05/31/2016 CLINICAL DATA:  Lethargy/altered mental status EXAM: PORTABLE CHEST 1 VIEW COMPARISON:  December 21, 2013 FINDINGS: There is no edema or consolidation. Heart is borderline enlarged with pulmonary vascularity within normal limits. No adenopathy. No bone lesions. IMPRESSION: No edema or consolidation.  Borderline cardiac enlargement. Electronically Signed   By: Lowella Grip III M.D.   On: 05/31/2016 17:43    My personal review of EKG: Rhythm NSR   Assessment & Plan:    Active Problems:   ARF (acute renal failure) (HCC)   Hypokalemia   Pre-syncope   1. Presyncope- likely due to hypotension, from antihypertensive medications. Patient has Zestoretic listed in the home medications, there is also question of metoprolol prescribed by her PCP. Patient is unable to verify at this time, will need to call patient's pharmacy in a.m. to confirm all antihypertensive medications. Check orthostatics vital signs every 8  hours. 2. Acute kidney injury- patient's creatinine is 1.40, her baseline creatinine is 0.98. Likely from diuretics,  started IV normal saline, will check BMP in a.m. 3. Hypokalemia- replace potassium and check BMP in a.m. 4. Suprapubic pain- UA was clear, not convincing for UTI. patient empirically given ceftriaxone in the ED. Follow urine culture results. 5. Bipolar disorder/depression- continue Cymbalta, Wellbutrin, BuSpar,  Risperdal. 6. Hyperlipidemia- continue Zocor   DVT Prophylaxis-   Lovenox   AM Labs Ordered, also please review Full Orders  Family Communication: No family at bedside  Code Status:   Full code  Admission status:  Observation    Time spent in minutes :  60 minutes   Guillermina Shaft S M.D on 05/31/2016 at 10:47 PM  Between 7am to 7pm - Pager - [phone]. After 7pm go to [email or web address] - password [facility]  [facility] - Office  [phone]

## 2016-06-01 DIAGNOSIS — R55 Syncope and collapse: Secondary | ICD-10-CM | POA: Diagnosis not present

## 2016-06-01 DIAGNOSIS — E876 Hypokalemia: Secondary | ICD-10-CM | POA: Diagnosis not present

## 2016-06-01 DIAGNOSIS — N179 Acute kidney failure, unspecified: Secondary | ICD-10-CM | POA: Diagnosis not present

## 2016-06-01 LAB — COMPREHENSIVE METABOLIC PANEL
ALT: 14 U/L (ref 14–54)
AST: 15 U/L (ref 15–41)
Albumin: 3.4 g/dL — ABNORMAL LOW (ref 3.5–5.0)
Alkaline Phosphatase: 82 U/L (ref 38–126)
Anion gap: 7 (ref 5–15)
BUN: 9 mg/dL (ref 6–20)
CO2: 29 mmol/L (ref 22–32)
Calcium: 8.3 mg/dL — ABNORMAL LOW (ref 8.9–10.3)
Chloride: 103 mmol/L (ref 101–111)
Creatinine, Ser: 0.82 mg/dL (ref 0.44–1.00)
GFR calc Af Amer: >60 mL/min (ref >60)
GFR calc non Af Amer: >60 mL/min (ref >60)
Glucose, Bld: 131 mg/dL — ABNORMAL HIGH (ref 65–99)
Potassium: 3.4 mmol/L — ABNORMAL LOW (ref 3.5–5.1)
Sodium: 139 mmol/L (ref 135–145)
Total Bilirubin: 0.5 mg/dL (ref 0.3–1.2)
Total Protein: 6 g/dL — ABNORMAL LOW (ref 6.5–8.1)

## 2016-06-01 LAB — CBC
HCT: 39.9 % (ref 36.0–46.0)
Hemoglobin: 13 g/dL (ref 12.0–15.0)
MCH: 30 pg (ref 26.0–34.0)
MCHC: 32.6 g/dL (ref 30.0–36.0)
MCV: 91.9 fL (ref 78.0–100.0)
Platelets: 248 10*3/uL (ref 150–400)
RBC: 4.34 MIL/uL (ref 3.87–5.11)
RDW: 14.6 % (ref 11.5–15.5)
WBC: 8.4 10*3/uL (ref 4.0–10.5)

## 2016-06-01 MED ORDER — POTASSIUM CHLORIDE CRYS ER 20 MEQ PO TBCR
40.0000 meq | EXTENDED_RELEASE_TABLET | Freq: Once | ORAL | Status: AC
  Administered 2016-06-01: 40 meq via ORAL
  Filled 2016-06-01: qty 2

## 2016-06-01 NOTE — Discharge Summary (Signed)
Physician Discharge Summary  Tiffany Lynch [id number] DOB: 1961/01/13 DOA: 05/31/2016  PCP: [facility]  Admit date: [id number] Discharge date: 06/01/2016  Time spent: 45 minutes  Recommendations for Outpatient Follow-up:  -Will be discharged home today. -Advised to stop lisinopril/HCTZ and to follow up with PCP in 1 week.   Discharge Diagnoses:  Active Problems:   ARF (acute renal failure) (HCC)   Hypokalemia   Pre-syncope   Discharge Condition: Stable and improved  Filed Weights   05/31/16 1647  Weight: 93.4 kg (206 lb)    History of present illness:  As per Dr. Darrick Meigs on 9/22: Tiffany Lynch  is a 55 y.o. female, *With history of hypertension, bipolar disorder, depression who was brought to the ED for generalized weakness, fatigue. Patient was recently started on medication for hypertension at community clinic, and since then has been feeling dizzy. This morning patient says that she was about to pass out. EMS was called and found patient lying on the couch at that time blood pressure was 60/48, and was given 30 0 mL bolus with improvement of blood pressure 94/56. She does complain of mild suprapubic pain, but no dysuria urgency or frequency of urination. UA was unremarkable in the ED  In the ED level reviewed potassium 2.6, creatinine 1.40. Patient home meds include lisinopril/hydrochlorothiazide 10/12.5 mg.  Hospital Course:   Orthostatic Hypotension -Due to newly started diuretic/ACE-I combination. -Have discontinued these meds and BP is normal on DC. -Will need close follow up with PCP to determine further BP needs.  Hypokalemia -Due to diuretic. -K 3.4 on DC, will give 40 meq prior to DC.  ARF -Due to BP meds. -Resolved: Cr 0.82 on DC.  Procedures:  None   Consultations:  None  Discharge Instructions  Discharge Instructions    Diet - low sodium heart healthy    Complete by:  As directed    Increase activity slowly    Complete by:   As directed        Medication List    STOP taking these medications   diclofenac 50 MG EC tablet Commonly known as:  VOLTAREN   lisinopril-hydrochlorothiazide 10-12.5 MG tablet Commonly known as:  PRINZIDE,ZESTORETIC     TAKE these medications   ALPRAZolam 1 MG tablet Commonly known as:  XANAX Take 1 mg by mouth 4 (four) times daily as needed for anxiety.   Buprenorphine 15 MCG/HR Ptwk Place 1 patch onto the skin every Friday.   buPROPion 300 MG 24 hr tablet Commonly known as:  WELLBUTRIN XL Take 300 mg by mouth every morning.   busPIRone 15 MG tablet Commonly known as:  BUSPAR Take 15 mg by mouth 3 (three) times daily.   cyclobenzaprine 10 MG tablet Commonly known as:  FLEXERIL Take 10 mg by mouth 3 (three) times daily as needed for muscle spasms.   DULoxetine 60 MG capsule Commonly known as:  CYMBALTA Take 60 mg by mouth every morning.   fluticasone 50 MCG/ACT nasal spray Commonly known as:  FLONASE use one spray in nare daily as needed for allergies/congestion   HYDROcodone-acetaminophen 5-325 MG tablet Commonly known as:  NORCO/VICODIN Take 1 tablet by mouth every 4 (four) hours as needed for moderate pain (started on 12/17/2013 #15). Reported on 10/26/2015   LYRICA 100 MG capsule Generic drug:  pregabalin Take 100 mg by mouth 3 (three) times daily.   omeprazole 20 MG capsule Commonly known as:  PRILOSEC TAKE ONE CAPSULE BY MOUTH ONE TIME  DAILY   risperiDONE 2 MG tablet Commonly known as:  RISPERDAL Take 1 tablet (2 mg total) by mouth at bedtime.   simvastatin 20 MG tablet Commonly known as:  ZOCOR Take 1 tablet (20 mg total) by mouth at bedtime.   solifenacin 5 MG tablet Commonly known as:  VESICARE Take 1 tablet (5 mg total) by mouth daily.   traMADol 50 MG tablet Commonly known as:  ULTRAM Take 50-100 mg by mouth every 6 (six) hours as needed for moderate pain. Reported on 10/26/2015   traZODone 100 MG tablet Commonly known as:   DESYREL Take 300 mg by mouth at bedtime.   VOLTAREN 1 % Gel Generic drug:  diclofenac sodium APPLY TO AFFECTED AREA 4 TIMES DAILY AS NEEDED      No Known Allergies Follow-up Information    [facility]. Schedule an appointment as soon as possible for a visit today.   Contact information: [address] [place] [address] [phone]            The results of significant diagnostics from this hospitalization (including imaging, microbiology, ancillary and laboratory) are listed below for reference.    Significant Diagnostic Studies: Dg Chest Portable 1 View  Result Date: 05/31/2016 CLINICAL DATA:  Lethargy/altered mental status EXAM: PORTABLE CHEST 1 VIEW COMPARISON:  December 21, 2013 FINDINGS: There is no edema or consolidation. Heart is borderline enlarged with pulmonary vascularity within normal limits. No adenopathy. No bone lesions. IMPRESSION: No edema or consolidation.  Borderline cardiac enlargement. Electronically Signed   By: Lowella Grip III M.D.   On: 05/31/2016 17:43    Microbiology: No results found for this or any previous visit (from the past 240 hour(s)).   Labs: Basic Metabolic Panel:  Recent Labs Lab 05/31/16 1716 05/31/16 1728 06/01/16 0847  NA 138 140 139  K 2.5* 2.6* 3.4*  CL 98* 94* 103  CO2 29  --  29  GLUCOSE 105* 104* 131*  BUN 16 16 9   CREATININE 1.38* 1.40* 0.82  CALCIUM 8.5*  --  8.3*   Liver Function Tests:  Recent Labs Lab 05/31/16 1716 06/01/16 0847  AST 13* 15  ALT 12* 14  ALKPHOS 77 82  BILITOT 0.5 0.5  PROT 6.0* 6.0*  ALBUMIN 3.5 3.4*   No results for input(s): LIPASE, AMYLASE in the last 168 hours. No results for input(s): AMMONIA in the last 168 hours. CBC:  Recent Labs Lab 05/31/16 1716 05/31/16 1728 06/01/16 0847  WBC 12.1*  --  8.4  NEUTROABS 7.9*  --   --   HGB 13.1 13.3 13.0  HCT 38.2 39.0 39.9  MCV 89.5  --  91.9  PLT 262  --  248   Cardiac Enzymes: No results for input(s):  CKTOTAL, CKMB, CKMBINDEX, TROPONINI in the last 168 hours. BNP: BNP (last 3 results) No results for input(s): BNP in the last 8760 hours.  ProBNP (last 3 results) No results for input(s): PROBNP in the last 8760 hours.  CBG: No results for input(s): GLUCAP in the last 168 hours.     SignedLelon Frohlich  [facility] Pager: [phone] 06/01/2016, 12:15 PM

## 2016-06-01 NOTE — Progress Notes (Signed)
Reviewed discharge instructions including medication schedule and next dose due. Instructed on signs to report and to follow up with primary MD, client voiced understanding. Client discharged to home in stable condition.

## 2016-06-04 LAB — URINE CULTURE: Culture: >=100000 — AB

## 2016-06-05 ENCOUNTER — Telehealth (HOSPITAL_BASED_OUTPATIENT_CLINIC_OR_DEPARTMENT_OTHER): Payer: Self-pay | Admitting: Emergency Medicine

## 2016-06-20 ENCOUNTER — Ambulatory Visit: Payer: Medicaid Other | Admitting: Orthopaedic Surgery

## 2016-06-20 ENCOUNTER — Encounter: Payer: Self-pay | Admitting: Orthopaedic Surgery

## 2016-06-27 ENCOUNTER — Encounter: Payer: Self-pay | Admitting: Orthopaedic Surgery

## 2016-06-27 ENCOUNTER — Ambulatory Visit (INDEPENDENT_AMBULATORY_CARE_PROVIDER_SITE_OTHER): Payer: Medicaid Other | Admitting: Orthopaedic Surgery

## 2016-06-27 VITALS — BP 153/100 | HR 88 | Temp 97.2°F | Ht 63.0 in | Wt 204.0 lb

## 2016-06-27 DIAGNOSIS — J42 Unspecified chronic bronchitis: Secondary | ICD-10-CM

## 2016-06-27 DIAGNOSIS — G8929 Other chronic pain: Secondary | ICD-10-CM

## 2016-06-27 DIAGNOSIS — M25562 Pain in left knee: Secondary | ICD-10-CM

## 2016-06-27 DIAGNOSIS — F17210 Nicotine dependence, cigarettes, uncomplicated: Secondary | ICD-10-CM

## 2016-06-27 MED ORDER — IBUPROFEN 600 MG PO TABS: 600.0000 mg | ORAL_TABLET | Freq: Four times a day (QID) | ORAL | 5 refills | Status: DC | PRN

## 2016-06-27 NOTE — Progress Notes (Signed)
Subjective:  My left knee hurts    Patient ID: Tiffany Lynch, female    DOB: 1961/05/03, 55 y.o.   MRN: [id number]  HPI  She has had pain and tenderness of the left knee since late April.  In May she was seen in [place] by an orthopedist but she cannot remember who she saw or what was done.    She went to the ER on March 29, 2016 for the left knee pain and swelling.  She had giving way of the knee and swelling and popping.  X-rays showed effusion and DJD.  She was told to see orthopedist.  [facility]  A referral was made her just in the last week or so.  She has no new trauma. She has tried ice, heat, rest, walker, crutches, rubs, Advil and Tylenol with only slight help.  Her knee gives way, pops, swells and just hurts all the time.  She is a smoker and is willing to consider to cut back. She smokes a pack a day.  Review of Systems  HENT: Negative for congestion.   Respiratory: Positive for shortness of breath and wheezing. Negative for cough.   Cardiovascular: Negative for chest pain and leg swelling.  Endocrine: Positive for cold intolerance.  Musculoskeletal: Positive for arthralgias, back pain, gait problem and joint swelling.  Allergic/Immunologic: Positive for environmental allergies.  Psychiatric/Behavioral: The patient is nervous/anxious.    Past Medical History:  Diagnosis Date  . Anxiety   . Bipolar 1 disorder ([place])   . Chronic back pain   . Depression   . GERD (gastroesophageal reflux disease)   . History of anemia   . Hypertension   . On home oxygen therapy    3LPM PRN  . Sleep apnea     Past Surgical History:  Procedure Laterality Date  . COLONOSCOPY WITH PROPOFOL N/A 07/18/2014   Procedure: COLONOSCOPY WITH PROPOFOL;  Surgeon: Rogene Houston, MD;  Location: [facility];  Service: Endoscopy;  Laterality: N/A;  in cecum at 0753, withdrawal time 20 min  . DILATION AND CURETTAGE OF UTERUS  age 78  . POLYPECTOMY  05/22/2012   Procedure:  POLYPECTOMY;  Surgeon: Jonnie Kind, MD;  Location: [facility];  Service: Gynecology;  Laterality: N/A;  Endometrial Polypectomy  . POLYPECTOMY N/A 07/18/2014   Procedure: POLYPECTOMY;  Surgeon: Rogene Houston, MD;  Location: [facility];  Service: Endoscopy;  Laterality: N/A;  . TUBAL LIGATION      Current Outpatient Prescriptions on File Prior to Visit  Medication Sig Dispense Refill  . ALPRAZolam (XANAX) 1 MG tablet Take 1 mg by mouth 4 (four) times daily as needed for anxiety.     Marland Kitchen buPROPion (WELLBUTRIN XL) 300 MG 24 hr tablet Take 300 mg by mouth every morning.    . DULoxetine (CYMBALTA) 60 MG capsule Take 60 mg by mouth every morning.     . [DISCONTINUED] paliperidone (INVEGA) 6 MG 24 hr tablet Take 6 mg by mouth daily.       No current facility-administered medications on file prior to visit.     Social History   Social History  . Marital status: Divorced    Spouse name: N/A  . Number of children: N/A  . Years of education: N/A   Occupational History  . Not on file.   Social History Main Topics  . Smoking status: Current Every Day Smoker    Packs/day: 1.00    Years: 35.00  Types: Cigarettes  . Smokeless tobacco: Never Used  . Alcohol use Yes     Comment: occasionally  . Drug use:     Types: Marijuana     Comment: history of cocaine use, last used 2 yrs ago  . Sexual activity: Yes    Birth control/ protection: Surgical     Comment: tubes tied   Other Topics Concern  . Not on file   Social History Narrative  . No narrative on file    Family History  Problem Relation Age of Onset  . Hypertension Mother   . Hypertension Brother   . Anxiety disorder Brother   . Depression Brother     BP (!) 153/100   Pulse 88   Temp 97.2 F (36.2 C)   Ht 5\' 3"  (1.6 m)   Wt 204 lb (92.5 kg)   LMP 05/06/2012   BMI 36.14 kg/m      Objective:   Physical Exam  Constitutional: She is oriented to person, place, and time. She appears well-developed and well-nourished.   HENT:  Head: Normocephalic and atraumatic.  Eyes: Conjunctivae and EOM are normal. Pupils are equal, round, and reactive to light.  Neck: Normal range of motion. Neck supple.  Cardiovascular: Normal rate, regular rhythm and intact distal pulses.   Pulmonary/Chest: Effort normal.  Abdominal: Soft.  Musculoskeletal: She exhibits tenderness (Pain left knee with 2+ effusion, ROM 0 to 105, medial pain, positive medial McMurray, limp to the left, NV intact.  Right knee negative.).  Neurological: She is alert and oriented to person, place, and time. She displays normal reflexes. No cranial nerve deficit. She exhibits normal muscle tone. Coordination normal.  Skin: Skin is warm and dry.  Psychiatric: She has a normal mood and affect. Her behavior is normal. Judgment and thought content normal.          Assessment & Plan:   Encounter Diagnoses  Name Primary?  . Chronic pain of left knee Yes  . Chronic bronchitis, unspecified chronic bronchitis type ([place])   . Cigarette nicotine dependence without complication    PROCEDURE NOTE:  The patient request injection, verbal consent was obtained.  The left knee was prepped appropriately after time out was performed.   Sterile technique was observed and anesthesia was provided by ethyl chloride and a 20-gauge needle was used to inject the knee area.  A 16-gauge needle was then used to aspirate the knee.  Color of fluid aspirated was straw colored  Total cc's aspirated was 35.    Injection of 1 cc of Depo-Medrol 40 mg with several cc's of plain xylocaine was then performed.  A band aid dressing was applied.  The patient was advised to apply ice later today and tomorrow to the injection sight as needed.  I would like to get a MRI of the knee as it has not gotten better over the last 6 months, she has giving way, swelling and positive medial McMurray.  I am concerned about a possible medial meniscus tear.  Return after the MRI.  Call if any  problem.  Electronically Signed Sanjuana Kava, MD 10/19/201711:07 AM

## 2016-06-27 NOTE — Patient Instructions (Signed)
Smoking Cessation, Tips for Success If you are ready to quit smoking, congratulations! You have chosen to help yourself be healthier. Cigarettes bring nicotine, tar, carbon monoxide, and other irritants into your body. Your lungs, heart, and blood vessels will be able to work better without these poisons. There are many different ways to quit smoking. Nicotine gum, nicotine patches, a nicotine inhaler, or nicotine nasal spray can help with physical craving. Hypnosis, support groups, and medicines help break the habit of smoking. WHAT THINGS CAN I DO TO MAKE QUITTING EASIER?  Here are some tips to help you quit for good:  Pick a date when you will quit smoking completely. Tell all of your friends and family about your plan to quit on that date.  Do not try to slowly cut down on the number of cigarettes you are smoking. Pick a quit date and quit smoking completely starting on that day.  Throw away all cigarettes.   Clean and remove all ashtrays from your home, work, and car.  On a card, write down your reasons for quitting. Carry the card with you and read it when you get the urge to smoke.  Cleanse your body of nicotine. Drink enough water and fluids to keep your urine clear or pale yellow. Do this after quitting to flush the nicotine from your body.  Learn to predict your moods. Do not let a bad situation be your excuse to have a cigarette. Some situations in your life might tempt you into wanting a cigarette.  Never have "just one" cigarette. It leads to wanting another and another. Remind yourself of your decision to quit.  Change habits associated with smoking. If you smoked while driving or when feeling stressed, try other activities to replace smoking. Stand up when drinking your coffee. Brush your teeth after eating. Sit in a different chair when you read the paper. Avoid alcohol while trying to quit, and try to drink fewer caffeinated beverages. Alcohol and caffeine may urge you to  smoke.  Avoid foods and drinks that can trigger a desire to smoke, such as sugary or spicy foods and alcohol.  Ask people who smoke not to smoke around you.  Have something planned to do right after eating or having a cup of coffee. For example, plan to take a walk or exercise.  Try a relaxation exercise to calm you down and decrease your stress. Remember, you may be tense and nervous for the first 2 weeks after you quit, but this will pass.  Find new activities to keep your hands busy. Play with a pen, coin, or rubber band. Doodle or draw things on paper.  Brush your teeth right after eating. This will help cut down on the craving for the taste of tobacco after meals. You can also try mouthwash.   Use oral substitutes in place of cigarettes. Try using lemon drops, carrots, cinnamon sticks, or chewing gum. Keep them handy so they are available when you have the urge to smoke.  When you have the urge to smoke, try deep breathing.  Designate your home as a nonsmoking area.  If you are a heavy smoker, ask your health care provider about a prescription for nicotine chewing gum. It can ease your withdrawal from nicotine.  Reward yourself. Set aside the cigarette money you save and buy yourself something nice.  Look for support from others. Join a support group or smoking cessation program. Ask someone at home or at work to help you with your plan   to quit smoking.  Always ask yourself, "Do I need this cigarette or is this just a reflex?" Tell yourself, "Today, I choose not to smoke," or "I do not want to smoke." You are reminding yourself of your decision to quit.  Do not replace cigarette smoking with electronic cigarettes (commonly called e-cigarettes). The safety of e-cigarettes is unknown, and some may contain harmful chemicals.  If you relapse, do not give up! Plan ahead and think about what you will do the next time you get the urge to smoke. HOW WILL I FEEL WHEN I QUIT SMOKING? You  may have symptoms of withdrawal because your body is used to nicotine (the addictive substance in cigarettes). You may crave cigarettes, be irritable, feel very hungry, cough often, get headaches, or have difficulty concentrating. The withdrawal symptoms are only temporary. They are strongest when you first quit but will go away within 10-14 days. When withdrawal symptoms occur, stay in control. Think about your reasons for quitting. Remind yourself that these are signs that your body is healing and getting used to being without cigarettes. Remember that withdrawal symptoms are easier to treat than the major diseases that smoking can cause.  Even after the withdrawal is over, expect periodic urges to smoke. However, these cravings are generally short lived and will go away whether you smoke or not. Do not smoke! WHAT RESOURCES ARE AVAILABLE TO HELP ME QUIT SMOKING? Your health care provider can direct you to community resources or hospitals for support, which may include:  Group support.  Education.  Hypnosis.  Therapy.   This information is not intended to replace advice given to you by your health care provider. Make sure you discuss any questions you have with your health care provider.   Document Released: 05/24/2004 Document Revised: 09/16/2014 Document Reviewed: 02/11/2013 Elsevier Interactive Patient Education 2016 [employer]  

## 2016-07-04 ENCOUNTER — Telehealth: Payer: Self-pay | Admitting: Orthopaedic Surgery

## 2016-07-04 MED ORDER — TRAMADOL HCL 50 MG PO TABS: 50.0000 mg | ORAL_TABLET | Freq: Four times a day (QID) | ORAL | 3 refills | Status: DC | PRN

## 2016-07-04 NOTE — Telephone Encounter (Signed)
Patient called to relay that her knee pain is continuing, and said that the Ibuprofen is not helping enough.  States does not want "something strong like Percocet, but maybe something like Tramadol." States she has also been trying Voltaren gel.  Patient is aware of scheduled MRI, and also of follow up appointment, 07/16/16. Please advise. Cell# [phone]

## 2016-07-10 ENCOUNTER — Ambulatory Visit
Admission: RE | Admit: 2016-07-10 | Discharge: 2016-07-10 | Disposition: A | Discharge status: Home / Self Care | Payer: Medicaid Other | Source: Ambulatory Visit | Attending: Orthopaedic Surgery | Admitting: Orthopaedic Surgery

## 2016-07-10 DIAGNOSIS — M25562 Pain in left knee: Principal | ICD-10-CM

## 2016-07-10 DIAGNOSIS — G8929 Other chronic pain: Secondary | ICD-10-CM

## 2016-07-16 ENCOUNTER — Ambulatory Visit: Payer: Medicaid Other | Admitting: Orthopaedic Surgery

## 2016-07-17 ENCOUNTER — Encounter: Payer: Self-pay | Admitting: Orthopaedic Surgery

## 2016-07-17 ENCOUNTER — Ambulatory Visit (INDEPENDENT_AMBULATORY_CARE_PROVIDER_SITE_OTHER): Payer: Medicaid Other | Admitting: Orthopaedic Surgery

## 2016-07-17 VITALS — BP 76/41 | HR 111 | Temp 97.5°F | Ht 63.0 in | Wt 204.0 lb

## 2016-07-17 DIAGNOSIS — M25562 Pain in left knee: Secondary | ICD-10-CM

## 2016-07-17 DIAGNOSIS — G8929 Other chronic pain: Secondary | ICD-10-CM

## 2016-07-17 NOTE — Progress Notes (Signed)
Patient OB:[id number] Tiffany Lynch, female DOB:[birth year], 55 y.o. [id number]  Chief Complaint  Patient presents with  . Follow-up    left knee MRI results    HPI  Tiffany Lynch is a 55 y.o. female who has marked pain of the left knee that is not getting any better.  She had MRI of the knee showing: IMPRESSION: 1. Large radial tear mid body medial meniscus. Probable flap of meniscal tissue in the anteromedial gutter proximally, adjacent to considerable underlying synovitis. 2. Fragmented osteochondral lesion of the medial femoral condyle with a scalloped bony defect along the articular surface on image 9/9, and the resulting osteochondral fragment sitting posterior to the lateral femoral condyle within the joint on image 20/9. 3. 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 4. Suspected mild subcortical stress fracture in the medial tibial plateau. 5. Thickened deep portion of the MCL probably related to the underlying meniscal tear. 6. Accentuated tissue along the anterior inferior margin of the ACL, probably incidental, less likely a flap of meniscal tissue. 7. Large knee effusion with synovitis and septation in the suprapatellar bursa. Considerable medial synovitis. 8. Poor definition of the femoral attachment of the medial patellofemoral ligament.  I will have her see Dr. Aline Brochure for possible surgery.  She is agreeable to this.  HPI  Body mass index is 36.14 kg/m.  ROS  Review of Systems  HENT: Negative for congestion.   Respiratory: Positive for shortness of breath and wheezing. Negative for cough.   Cardiovascular: Negative for chest pain and leg swelling.  Endocrine: Positive for cold intolerance.  Musculoskeletal: Positive for arthralgias, back pain, gait problem and joint swelling.  Allergic/Immunologic: Positive for environmental allergies.  Psychiatric/Behavioral: The patient is nervous/anxious.      Past Medical History:  Diagnosis Date  . Anxiety   . Bipolar 1 disorder ([place])   . Chronic back pain   . Depression   . GERD (gastroesophageal reflux disease)   . History of anemia   . Hypertension   . On home oxygen therapy    3LPM PRN  . Sleep apnea     Past Surgical History:  Procedure Laterality Date  . COLONOSCOPY WITH PROPOFOL N/A 07/18/2014   Procedure: COLONOSCOPY WITH PROPOFOL;  Surgeon: Rogene Houston, MD;  Location: [facility];  Service: Endoscopy;  Laterality: N/A;  in cecum at 0753, withdrawal time 20 min  . DILATION AND CURETTAGE OF UTERUS  age 66  . POLYPECTOMY  05/22/2012   Procedure: POLYPECTOMY;  Surgeon: Jonnie Kind, MD;  Location: [facility];  Service: Gynecology;  Laterality: N/A;  Endometrial Polypectomy  . POLYPECTOMY N/A 07/18/2014   Procedure: POLYPECTOMY;  Surgeon: Rogene Houston, MD;  Location: [facility];  Service: Endoscopy;  Laterality: N/A;  . TUBAL LIGATION      Family History  Problem Relation Age of Onset  . Hypertension Mother   . Hypertension Brother   . Anxiety disorder Brother   . Depression Brother     Social History Social History  Substance Use Topics  . Smoking status: Current Every Day Smoker    Packs/day: 1.00    Years: 35.00    Types: Cigarettes  . Smokeless tobacco: Never Used  . Alcohol use Yes     Comment: occasionally    No Known Allergies  Current Outpatient Prescriptions  Medication Sig Dispense Refill  . ALPRAZolam (XANAX) 1 MG tablet Take 1 mg by mouth 4 (four) times daily as needed for  anxiety.     Marland Kitchen buPROPion (WELLBUTRIN XL) 300 MG 24 hr tablet Take 300 mg by mouth every morning.    . DULoxetine (CYMBALTA) 60 MG capsule Take 60 mg by mouth every morning.     Marland Kitchen ibuprofen (ADVIL,MOTRIN) 600 MG tablet Take 1 tablet (600 mg total) by mouth every 6 (six) hours as needed. 100 tablet 5  . IBUPROFEN PO Take by mouth.    . traMADol (ULTRAM) 50 MG tablet Take 1 tablet (50 mg total) by mouth every 6 (six) hours as needed.  60 tablet 3   No current facility-administered medications for this visit.      Physical Exam  Blood pressure (!) 76/41, pulse (!) 111, temperature 97.5 F (36.4 C), height 5\' 3"  (1.6 m), weight 204 lb (92.5 kg), last menstrual period 05/06/2012.  Constitutional: overall normal hygiene, normal nutrition, well developed, normal grooming, normal body habitus. Assistive device:walker  Musculoskeletal: gait and station Limp left, muscle tone and strength are normal, no tremors or atrophy is present.  .  Neurological: coordination overall normal.  Deep tendon reflex/nerve stretch intact.  Sensation normal.  Cranial nerves II-XII intact.   Skin:   Normal overall no scars, lesions, ulcers or rashes. No psoriasis.  Psychiatric: Alert and oriented x 3.  Recent memory intact, remote memory unclear.  Normal mood and affect. Well groomed.  Good eye contact.  Cardiovascular: overall no swelling, no varicosities, no edema bilaterally, normal temperatures of the legs and arms, no clubbing, cyanosis and good capillary refill.  Lymphatic: palpation is normal.  The left lower extremity is examined:  Inspection:  Thigh:  Non-tender and no defects  Knee has swelling 2+ effusion.                        Joint tenderness is present                        Patient is tender over the medial joint line  Lower Leg:  Has normal appearance and no tenderness or defects  Ankle:  Non-tender and no defects  Foot:  Non-tender and no defects Range of Motion:  Knee:  Range of motion is: 0-100                        Crepitus is  present  Ankle:  Range of motion is normal. Strength and Tone:  The left lower extremity has normal strength and tone. Stability:  Knee:  The knee has positive medial McMurray and slight drawer sign.  Ankle:  The ankle is stable.    The patient has been educated about the nature of the problem(s) and counseled on treatment options.  The patient appeared to understand what I have  discussed and is in agreement with it.  Encounter Diagnosis  Name Primary?  . Chronic pain of left knee Yes    PLAN Call if any problems.  Precautions discussed.  Continue current medications.   Return to clinic see Dr. Aline Brochure   Electronically Signed Sanjuana Kava, MD 11/8/20173:41 PM

## 2016-08-14 ENCOUNTER — Encounter (HOSPITAL_COMMUNITY): Payer: Self-pay

## 2016-08-14 ENCOUNTER — Emergency Department (HOSPITAL_COMMUNITY): Payer: Medicaid Other

## 2016-08-14 ENCOUNTER — Inpatient Hospital Stay (HOSPITAL_COMMUNITY)
Admission: EM | Admit: 2016-08-14 | Discharge: 2016-08-15 | DRG: 190 | Disposition: A | Discharge status: Home / Self Care | Payer: Medicaid Other | Attending: Family Medicine | Admitting: Family Medicine

## 2016-08-14 DIAGNOSIS — J181 Lobar pneumonia, unspecified organism: Secondary | ICD-10-CM

## 2016-08-14 DIAGNOSIS — Y900 Blood alcohol level of less than 20 mg/100 ml: Secondary | ICD-10-CM | POA: Diagnosis present

## 2016-08-14 DIAGNOSIS — G8929 Other chronic pain: Secondary | ICD-10-CM | POA: Diagnosis present

## 2016-08-14 DIAGNOSIS — Z7951 Long term (current) use of inhaled steroids: Secondary | ICD-10-CM

## 2016-08-14 DIAGNOSIS — Z9981 Dependence on supplemental oxygen: Secondary | ICD-10-CM

## 2016-08-14 DIAGNOSIS — Z79899 Other long term (current) drug therapy: Secondary | ICD-10-CM | POA: Diagnosis not present

## 2016-08-14 DIAGNOSIS — B370 Candidal stomatitis: Secondary | ICD-10-CM | POA: Diagnosis present

## 2016-08-14 DIAGNOSIS — J449 Chronic obstructive pulmonary disease, unspecified: Secondary | ICD-10-CM | POA: Diagnosis present

## 2016-08-14 DIAGNOSIS — F1010 Alcohol abuse, uncomplicated: Secondary | ICD-10-CM | POA: Diagnosis present

## 2016-08-14 DIAGNOSIS — Z791 Long term (current) use of non-steroidal anti-inflammatories (NSAID): Secondary | ICD-10-CM

## 2016-08-14 DIAGNOSIS — I959 Hypotension, unspecified: Secondary | ICD-10-CM | POA: Diagnosis present

## 2016-08-14 DIAGNOSIS — J189 Pneumonia, unspecified organism: Secondary | ICD-10-CM

## 2016-08-14 DIAGNOSIS — E860 Dehydration: Secondary | ICD-10-CM | POA: Diagnosis present

## 2016-08-14 DIAGNOSIS — K219 Gastro-esophageal reflux disease without esophagitis: Secondary | ICD-10-CM | POA: Diagnosis present

## 2016-08-14 DIAGNOSIS — J440 Chronic obstructive pulmonary disease with acute lower respiratory infection: Principal | ICD-10-CM | POA: Diagnosis present

## 2016-08-14 DIAGNOSIS — J9691 Respiratory failure, unspecified with hypoxia: Secondary | ICD-10-CM | POA: Diagnosis present

## 2016-08-14 DIAGNOSIS — I10 Essential (primary) hypertension: Secondary | ICD-10-CM | POA: Diagnosis present

## 2016-08-14 DIAGNOSIS — M549 Dorsalgia, unspecified: Secondary | ICD-10-CM | POA: Diagnosis present

## 2016-08-14 DIAGNOSIS — R739 Hyperglycemia, unspecified: Secondary | ICD-10-CM | POA: Diagnosis present

## 2016-08-14 DIAGNOSIS — F17210 Nicotine dependence, cigarettes, uncomplicated: Secondary | ICD-10-CM | POA: Diagnosis present

## 2016-08-14 DIAGNOSIS — G894 Chronic pain syndrome: Secondary | ICD-10-CM

## 2016-08-14 DIAGNOSIS — F319 Bipolar disorder, unspecified: Secondary | ICD-10-CM | POA: Diagnosis present

## 2016-08-14 DIAGNOSIS — R531 Weakness: Secondary | ICD-10-CM | POA: Diagnosis present

## 2016-08-14 DIAGNOSIS — F419 Anxiety disorder, unspecified: Secondary | ICD-10-CM | POA: Diagnosis present

## 2016-08-14 DIAGNOSIS — Z8249 Family history of ischemic heart disease and other diseases of the circulatory system: Secondary | ICD-10-CM

## 2016-08-14 DIAGNOSIS — G4733 Obstructive sleep apnea (adult) (pediatric): Secondary | ICD-10-CM | POA: Diagnosis present

## 2016-08-14 DIAGNOSIS — F1410 Cocaine abuse, uncomplicated: Secondary | ICD-10-CM | POA: Diagnosis present

## 2016-08-14 DIAGNOSIS — N179 Acute kidney failure, unspecified: Secondary | ICD-10-CM | POA: Diagnosis present

## 2016-08-14 DIAGNOSIS — Z818 Family history of other mental and behavioral disorders: Secondary | ICD-10-CM | POA: Diagnosis not present

## 2016-08-14 DIAGNOSIS — J42 Unspecified chronic bronchitis: Secondary | ICD-10-CM

## 2016-08-14 DIAGNOSIS — F17200 Nicotine dependence, unspecified, uncomplicated: Secondary | ICD-10-CM

## 2016-08-14 HISTORY — DX: Chronic obstructive pulmonary disease, unspecified: J44.9

## 2016-08-14 LAB — RAPID URINE DRUG SCREEN, HOSP PERFORMED
Amphetamines: NOT DETECTED
Barbiturates: NOT DETECTED
Benzodiazepines: POSITIVE — AB
Cocaine: POSITIVE — AB
Opiates: NOT DETECTED
Tetrahydrocannabinol: NOT DETECTED

## 2016-08-14 LAB — URINALYSIS, ROUTINE W REFLEX MICROSCOPIC
Bilirubin Urine: NEGATIVE
Glucose, UA: NEGATIVE mg/dL
Hgb urine dipstick: NEGATIVE
Ketones, ur: NEGATIVE mg/dL
Leukocytes, UA: NEGATIVE
Nitrite: NEGATIVE
Protein, ur: NEGATIVE mg/dL
Specific Gravity, Urine: 1.008 (ref 1.005–1.030)
pH: 6 (ref 5.0–8.0)

## 2016-08-14 LAB — BASIC METABOLIC PANEL
Anion gap: 8 (ref 5–15)
BUN: 20 mg/dL (ref 6–20)
CO2: 28 mmol/L (ref 22–32)
Calcium: 8.4 mg/dL — ABNORMAL LOW (ref 8.9–10.3)
Chloride: 101 mmol/L (ref 101–111)
Creatinine, Ser: 1.46 mg/dL — ABNORMAL HIGH (ref 0.44–1.00)
GFR calc Af Amer: 46 mL/min — ABNORMAL LOW (ref >60)
GFR calc non Af Amer: 39 mL/min — ABNORMAL LOW (ref >60)
Glucose, Bld: 100 mg/dL — ABNORMAL HIGH (ref 65–99)
Potassium: 3.7 mmol/L (ref 3.5–5.1)
Sodium: 137 mmol/L (ref 135–145)

## 2016-08-14 LAB — CBC WITH DIFFERENTIAL/PLATELET
Basophils Absolute: 0 10*3/uL (ref 0.0–0.1)
Basophils Relative: 0 %
Eosinophils Absolute: 0.2 10*3/uL (ref 0.0–0.7)
Eosinophils Relative: 1 %
HCT: 41.3 % (ref 36.0–46.0)
Hemoglobin: 13.9 g/dL (ref 12.0–15.0)
Lymphocytes Relative: 38 %
Lymphs Abs: 5 10*3/uL — ABNORMAL HIGH (ref 0.7–4.0)
MCH: 31.6 pg (ref 26.0–34.0)
MCHC: 33.7 g/dL (ref 30.0–36.0)
MCV: 93.9 fL (ref 78.0–100.0)
Monocytes Absolute: 0.7 10*3/uL (ref 0.1–1.0)
Monocytes Relative: 5 %
Neutro Abs: 7.3 10*3/uL (ref 1.7–7.7)
Neutrophils Relative %: 56 %
Platelets: 268 10*3/uL (ref 150–400)
RBC: 4.4 MIL/uL (ref 3.87–5.11)
RDW: 14.1 % (ref 11.5–15.5)
WBC: 13.3 10*3/uL — ABNORMAL HIGH (ref 4.0–10.5)

## 2016-08-14 LAB — ETHANOL: Alcohol, Ethyl (B): 10 mg/dL — ABNORMAL HIGH (ref <5)

## 2016-08-14 MED ORDER — FLUTICASONE PROPIONATE 50 MCG/ACT NA SUSP
1.0000 | Freq: Every day | NASAL | Status: DC
  Administered 2016-08-15: 1 via NASAL
  Filled 2016-08-14: qty 16

## 2016-08-14 MED ORDER — MORPHINE SULFATE (PF) 2 MG/ML IV SOLN: 2.0000 mg | INTRAVENOUS | Status: DC | PRN

## 2016-08-14 MED ORDER — ENOXAPARIN SODIUM 40 MG/0.4ML ~~LOC~~ SOLN
40.0000 mg | SUBCUTANEOUS | Status: DC
  Administered 2016-08-14: 40 mg via SUBCUTANEOUS
  Filled 2016-08-14: qty 0.4

## 2016-08-14 MED ORDER — FOLIC ACID 1 MG PO TABS
1.0000 mg | ORAL_TABLET | Freq: Every day | ORAL | Status: DC
  Administered 2016-08-15: 1 mg via ORAL
  Filled 2016-08-14: qty 1

## 2016-08-14 MED ORDER — DEXTROSE 5 % IV SOLN
1.0000 g | INTRAVENOUS | Status: DC
  Administered 2016-08-14: 1 g via INTRAVENOUS
  Filled 2016-08-14: qty 10

## 2016-08-14 MED ORDER — ACETAMINOPHEN 325 MG PO TABS: 650.0000 mg | ORAL_TABLET | Freq: Four times a day (QID) | ORAL | Status: DC | PRN

## 2016-08-14 MED ORDER — TRAMADOL HCL 50 MG PO TABS: 50.0000 mg | ORAL_TABLET | Freq: Four times a day (QID) | ORAL | Status: DC | PRN

## 2016-08-14 MED ORDER — SODIUM CHLORIDE 0.9 % IV BOLUS (SEPSIS)
1000.0000 mL | Freq: Once | INTRAVENOUS | Status: AC
  Administered 2016-08-14: 1000 mL via INTRAVENOUS

## 2016-08-14 MED ORDER — LACTATED RINGERS IV SOLN
INTRAVENOUS | Status: DC
Start: 2016-08-14 — End: 2016-08-15
  Administered 2016-08-14 – 2016-08-15 (×2): via INTRAVENOUS

## 2016-08-14 MED ORDER — DEXTROSE 5 % IV SOLN
500.0000 mg | Freq: Once | INTRAVENOUS | Status: AC
  Administered 2016-08-14: 500 mg via INTRAVENOUS
  Filled 2016-08-14: qty 500

## 2016-08-14 MED ORDER — PREGABALIN 50 MG PO CAPS
100.0000 mg | ORAL_CAPSULE | Freq: Three times a day (TID) | ORAL | Status: DC
  Administered 2016-08-14 – 2016-08-15 (×2): 100 mg via ORAL
  Filled 2016-08-14 (×2): qty 2

## 2016-08-14 MED ORDER — ONDANSETRON HCL 4 MG PO TABS: 4.0000 mg | ORAL_TABLET | Freq: Four times a day (QID) | ORAL | Status: DC | PRN

## 2016-08-14 MED ORDER — TRAZODONE HCL 50 MG PO TABS: 300.0000 mg | ORAL_TABLET | Freq: Every day | ORAL | Status: DC

## 2016-08-14 MED ORDER — FLUCONAZOLE 100 MG PO TABS
200.0000 mg | ORAL_TABLET | Freq: Once | ORAL | Status: AC
  Administered 2016-08-14: 200 mg via ORAL
  Filled 2016-08-14: qty 2

## 2016-08-14 MED ORDER — VITAMIN B-1 100 MG PO TABS
100.0000 mg | ORAL_TABLET | Freq: Every day | ORAL | Status: DC
  Administered 2016-08-15: 100 mg via ORAL
  Filled 2016-08-14: qty 1

## 2016-08-14 MED ORDER — NICOTINE 14 MG/24HR TD PT24
14.0000 mg | MEDICATED_PATCH | Freq: Every day | TRANSDERMAL | Status: DC
  Administered 2016-08-14 – 2016-08-15 (×2): 14 mg via TRANSDERMAL
  Filled 2016-08-14 (×2): qty 1

## 2016-08-14 MED ORDER — PANTOPRAZOLE SODIUM 40 MG PO TBEC
40.0000 mg | DELAYED_RELEASE_TABLET | Freq: Every day | ORAL | Status: DC
  Administered 2016-08-15: 40 mg via ORAL
  Filled 2016-08-14: qty 1

## 2016-08-14 MED ORDER — CYCLOBENZAPRINE HCL 10 MG PO TABS
10.0000 mg | ORAL_TABLET | Freq: Three times a day (TID) | ORAL | Status: DC
  Administered 2016-08-15: 10 mg via ORAL
  Filled 2016-08-14: qty 1

## 2016-08-14 MED ORDER — BUPROPION HCL ER (XL) 300 MG PO TB24
300.0000 mg | ORAL_TABLET | Freq: Every morning | ORAL | Status: DC
  Administered 2016-08-15: 300 mg via ORAL
  Filled 2016-08-14 (×2): qty 1

## 2016-08-14 MED ORDER — THIAMINE HCL 100 MG/ML IJ SOLN: 100.0000 mg | Freq: Every day | INTRAMUSCULAR | Status: DC

## 2016-08-14 MED ORDER — ADULT MULTIVITAMIN W/MINERALS CH
1.0000 | ORAL_TABLET | Freq: Every day | ORAL | Status: DC
  Administered 2016-08-15: 1 via ORAL
  Filled 2016-08-14: qty 1

## 2016-08-14 MED ORDER — BUPRENORPHINE 15 MCG/HR TD PTWK: 1.0000 | MEDICATED_PATCH | TRANSDERMAL | Status: DC

## 2016-08-14 MED ORDER — BUSPIRONE HCL 5 MG PO TABS
15.0000 mg | ORAL_TABLET | Freq: Three times a day (TID) | ORAL | Status: DC
  Administered 2016-08-14 – 2016-08-15 (×2): 15 mg via ORAL
  Filled 2016-08-14 (×2): qty 3

## 2016-08-14 MED ORDER — LORATADINE 10 MG PO TABS
10.0000 mg | ORAL_TABLET | Freq: Every day | ORAL | Status: DC
  Administered 2016-08-15: 10 mg via ORAL
  Filled 2016-08-14: qty 1

## 2016-08-14 MED ORDER — ACETAMINOPHEN 650 MG RE SUPP: 650.0000 mg | Freq: Four times a day (QID) | RECTAL | Status: DC | PRN

## 2016-08-14 MED ORDER — LORAZEPAM 1 MG PO TABS: 1.0000 mg | ORAL_TABLET | Freq: Four times a day (QID) | ORAL | Status: DC | PRN

## 2016-08-14 MED ORDER — DULOXETINE HCL 60 MG PO CPEP
60.0000 mg | ORAL_CAPSULE | Freq: Every day | ORAL | Status: DC
  Administered 2016-08-15: 60 mg via ORAL
  Filled 2016-08-14: qty 1

## 2016-08-14 MED ORDER — ALPRAZOLAM 1 MG PO TABS
1.0000 mg | ORAL_TABLET | Freq: Four times a day (QID) | ORAL | Status: DC | PRN
  Administered 2016-08-14: 1 mg via ORAL
  Filled 2016-08-14: qty 1

## 2016-08-14 MED ORDER — RISPERIDONE 1 MG PO TABS
3.0000 mg | ORAL_TABLET | Freq: Every day | ORAL | Status: DC
  Administered 2016-08-14: 3 mg via ORAL
  Filled 2016-08-14: qty 3

## 2016-08-14 MED ORDER — FLUCONAZOLE 100 MG PO TABS
100.0000 mg | ORAL_TABLET | Freq: Every day | ORAL | Status: DC
  Administered 2016-08-15: 100 mg via ORAL
  Filled 2016-08-14: qty 1

## 2016-08-14 MED ORDER — SODIUM CHLORIDE 0.9% FLUSH
3.0000 mL | Freq: Two times a day (BID) | INTRAVENOUS | Status: DC
  Administered 2016-08-15: 3 mL via INTRAVENOUS

## 2016-08-14 MED ORDER — ONDANSETRON HCL 4 MG/2ML IJ SOLN: 4.0000 mg | Freq: Four times a day (QID) | INTRAMUSCULAR | Status: DC | PRN

## 2016-08-14 MED ORDER — LORAZEPAM 2 MG/ML IJ SOLN: 1.0000 mg | Freq: Four times a day (QID) | INTRAMUSCULAR | Status: DC | PRN
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
## 2016-08-14 NOTE — Progress Notes (Signed)
Pharmacy Antibiotic Note  Tiffany Lynch is a 55 y.o. female admitted on 08/14/2016 with thrush/oropharyngeal candidiasis.  Pharmacy has been consulted for fluconazole dosing.  Plan: Fluconazole 200mg  loading dose, then 100mg  daily for total of 7 days F/U clinical progress and monitor labs  Height: 5\' 3"  (160 cm) Weight: 200 lb (90.7 kg) IBW/kg (Calculated) : 52.4  Temp (24hrs), Avg:98 F (36.7 C), Min:98 F (36.7 C), Max:98 F (36.7 C)   Recent Labs Lab 08/14/16 1435  WBC 13.3*  CREATININE 1.46*    Estimated Creatinine Clearance: 46.5 mL/min (by C-G formula based on SCr of 1.46 mg/dL (H)).    No Known Allergies  Antimicrobials this admission: Fluconazole 12/6 >>  Azithromycin and ceftriazone x 1 dose in ED 12/6   Dose adjustments this admission: n/a  Thank you for allowing pharmacy to be a part of this patient's care.  Isac Sarna, BS [place], [place] Clinical Pharmacist Pager [phone]  08/14/2016 10:07 PM

## 2016-08-14 NOTE — H&P (Signed)
History and Physical    Tiffany Lynch [id number] DOB: [birth year] DOA: 08/14/2016  PCP: Dionisio Paschal, NP  Consultants: Merlene Laughter (neuro)  Patient coming from: home - lives with family members and her (ex?)boyfriend; they aren't nice to her and are causing her significant stress and she is considering moving out  Chief Complaint: weakness  HPI: Tiffany Lynch is a 55 y.o. female with medical history significant of OSA, COPD, bipolar, back and knee pain, and HTN presenting with c/o generalized weakness.  Patient reports significant life stressors that have been causing her to feel very depressed.  She has a h/o cocaine abuse in the past and her daughter brought some into the house and so she used it.  She also has a h/o ETOH dependence and she started drinking about over the last few months, a pint or more a day.  She does have a h/o DTs including seizures, and she seized for the first time in years over the weekend.  She has significant mouth and throat pain and thinks she may have some ulcers.  She has chronic back and knee pain and reports that she used the cocaine in part to control the pain.  She denies pain other than her chronic issues.  She has had some SOB but denies cough or fever.  ED Course: Per Dr. Vanita Panda:  Community acquired pneumonia of right lower lobe of lung ([place])  Hypotension, unspecified hypotension type   Patient presents with weakness, is found to have hypotension, hypoxia.  When awake patient is awake and alert oriented appropriately, denies pain, and there is low suspicion for ongoing coronary ischemia.  Patient has a leukocytosis, and given the hypotension, hypoxia, there is some suspicion for pneumonia.  Patient received 3 L fluid resuscitation, and initiation of antibiotics, and persistently borderline hypotension as well as hypoxia with ambulation.  patient required admission for further evaluation and management.    Review of Systems: As per HPI; otherwise 10  point review of systems reviewed and negative.    Past Medical History:  Diagnosis Date  . Anxiety   . Bipolar 1 disorder ([place])   . Chronic back pain   . COPD (chronic obstructive pulmonary disease) ([place])   . Depression   . GERD (gastroesophageal reflux disease)   . History of anemia   . Hypertension   . On home oxygen therapy    3LPM PRN  . Sleep apnea     Past Surgical History:  Procedure Laterality Date  . COLONOSCOPY WITH PROPOFOL N/A 07/18/2014   Procedure: COLONOSCOPY WITH PROPOFOL;  Surgeon: Rogene Houston, MD;  Location: [facility];  Service: Endoscopy;  Laterality: N/A;  in cecum at 0753, withdrawal time 20 min  . DILATION AND CURETTAGE OF UTERUS  age 13  . POLYPECTOMY  05/22/2012   Procedure: POLYPECTOMY;  Surgeon: Jonnie Kind, MD;  Location: [facility];  Service: Gynecology;  Laterality: N/A;  Endometrial Polypectomy  . POLYPECTOMY N/A 07/18/2014   Procedure: POLYPECTOMY;  Surgeon: Rogene Houston, MD;  Location: [facility];  Service: Endoscopy;  Laterality: N/A;  . TUBAL LIGATION      Social History   Social History  . Marital status: Divorced    Spouse name: N/A  . Number of children: N/A  . Years of education: N/A   Occupational History  . Not on file.   Social History Main Topics  . Smoking status: Current Every Day Smoker    Packs/day: 1.00    Years: 35.00  Types: Cigarettes  . Smokeless tobacco: Never Used  . Alcohol use Yes     Comment: occasionally  . Drug use:     Types: Marijuana     Comment: history of cocaine use, last used 2 yrs ago  . Sexual activity: Yes    Birth control/ protection: Surgical     Comment: tubes tied   Other Topics Concern  . Not on file   Social History Narrative  . No narrative on file    No Known Allergies  Family History  Problem Relation Age of Onset  . Hypertension Mother   . Hypertension Brother   . Anxiety disorder Brother   . Depression Brother     Prior to Admission medications   Medication Sig Start  Date End Date Taking? Authorizing Provider  ALPRAZolam Duanne Moron) 1 MG tablet Take 1 mg by mouth 4 (four) times daily as needed for anxiety.    Yes Historical Provider, MD  amLODipine (NORVASC) 5 MG tablet Take 5 mg by mouth daily. 07/29/16  Yes Historical Provider, MD  buPROPion (WELLBUTRIN XL) 300 MG 24 hr tablet Take 300 mg by mouth every morning.   Yes Historical Provider, MD  busPIRone (BUSPAR) 15 MG tablet Take 15 mg by mouth 3 (three) times daily. 07/27/16  Yes Historical Provider, MD  BUTRANS 15 MCG/HR PTWK Apply 1 patch topically once a week. 08/07/16  Yes Historical Provider, MD  cyclobenzaprine (FLEXERIL) 10 MG tablet Take 10 mg by mouth 3 (three) times daily. 07/29/16  Yes Historical Provider, MD  DULoxetine (CYMBALTA) 60 MG capsule Take 60 mg by mouth every morning.    Yes Historical Provider, MD  lisinopril-hydrochlorothiazide (PRINZIDE,ZESTORETIC) 10-12.5 MG tablet Take 1 tablet by mouth daily. 07/29/16  Yes Historical Provider, MD  loratadine (CLARITIN) 10 MG tablet Take 10 mg by mouth daily. 07/09/16  Yes Historical Provider, MD  LYRICA 100 MG capsule Take 100 mg by mouth 3 (three) times daily. 08/07/16  Yes Historical Provider, MD  omeprazole (PRILOSEC) 20 MG capsule Take 20 mg by mouth daily. 07/29/16  Yes Historical Provider, MD  risperiDONE (RISPERDAL) 3 MG tablet Take 3 mg by mouth at bedtime. 05/23/16  Yes Historical Provider, MD  traMADol (ULTRAM) 50 MG tablet Take 1 tablet (50 mg total) by mouth every 6 (six) hours as needed. Patient taking differently: Take 50 mg by mouth every 6 (six) hours as needed for moderate pain or severe pain.  07/04/16  Yes Sanjuana Kava, MD  traZODone (DESYREL) 100 MG tablet Take 300 mg by mouth at bedtime. 07/01/16  Yes Historical Provider, MD  fluticasone (FLONASE) 50 MCG/ACT nasal spray Place 1 spray into both nostrils daily. 07/10/16   Historical Provider, MD  ibuprofen (ADVIL,MOTRIN) 600 MG tablet Take 1 tablet (600 mg total) by mouth every 6  (six) hours as needed. 06/27/16   Sanjuana Kava, MD  ibuprofen (ADVIL,MOTRIN) 800 MG tablet Take 800 mg by mouth 3 (three) times daily. 07/29/16   Historical Provider, MD    Physical Exam: Vitals:   08/14/16 1909 08/14/16 1915 08/14/16 1930 08/14/16 2000  BP: 95/59   93/56  Pulse: 83  78 79  Resp: 17  13 19   Temp:      TempSrc:      SpO2: 92% 91% 94% 92%  Weight:      Height:         General: Appears calm and comfortable and is NAD; somnolent but appropriate, somewhat emotionally labile Eyes:  PERRL, EOMI, normal lids, iris  ENT:  grossly normal hearing,diffuse erythema in the mouth and on the tongue with scattered plaques and ulcerations c/w thrush Neck:  no LAD, masses or thyromegaly Cardiovascular:  RRR, no m/r/g. No LE edema.  Respiratory: CTA bilaterally, no w/r/r. Normal respiratory effort. Abdomen:  soft, ntnd, NABS Skin:  no rash or induration seen on limited exam Musculoskeletal:  grossly normal tone BUE/BLE, good ROM, no bony abnormality Psychiatric:  Blunted affect, emotionally labile at times, speech fluent and appropriate, AOx3 Neurologic:  CN 2-12 grossly intact, moves all extremities in coordinated fashion, sensation intact  Labs on Admission: I have personally reviewed following labs and imaging studies  CBC:  Recent Labs Lab 08/14/16 1435  WBC 13.3*  NEUTROABS 7.3  HGB 13.9  HCT 41.3  MCV 93.9  PLT [id number]   Basic Metabolic Panel:  Recent Labs Lab 08/14/16 1435  NA 137  K 3.7  CL 101  CO2 28  GLUCOSE 100*  BUN 20  CREATININE 1.46*  CALCIUM 8.4*   GFR: Estimated Creatinine Clearance: 46.5 mL/min (by C-G formula based on SCr of 1.46 mg/dL (H)). Liver Function Tests: No results for input(s): AST, ALT, ALKPHOS, BILITOT, PROT, ALBUMIN in the last 168 hours. No results for input(s): LIPASE, AMYLASE in the last 168 hours. No results for input(s): AMMONIA in the last 168 hours. Coagulation Profile: No results for input(s): INR, PROTIME in the last  168 hours. Cardiac Enzymes: No results for input(s): CKTOTAL, CKMB, CKMBINDEX, TROPONINI in the last 168 hours. BNP (last 3 results) No results for input(s): PROBNP in the last 8760 hours. HbA1C: No results for input(s): HGBA1C in the last 72 hours. CBG: No results for input(s): GLUCAP in the last 168 hours. Lipid Profile: No results for input(s): CHOL, HDL, LDLCALC, TRIG, CHOLHDL, LDLDIRECT in the last 72 hours. Thyroid Function Tests: No results for input(s): TSH, T4TOTAL, FREET4, T3FREE, THYROIDAB in the last 72 hours. Anemia Panel: No results for input(s): VITAMINB12, FOLATE, FERRITIN, TIBC, IRON, RETICCTPCT in the last 72 hours. Urine analysis:    Component Value Date/Time   COLORURINE YELLOW 08/14/2016 1548   APPEARANCEUR HAZY (A) 08/14/2016 1548   LABSPEC 1.008 08/14/2016 1548   PHURINE 6.0 08/14/2016 1548   GLUCOSEU NEGATIVE 08/14/2016 1548   HGBUR NEGATIVE 08/14/2016 1548   BILIRUBINUR NEGATIVE 08/14/2016 1548   BILIRUBINUR negative 10/31/2015 1017   KETONESUR NEGATIVE 08/14/2016 1548   PROTEINUR NEGATIVE 08/14/2016 1548   UROBILINOGEN negative 10/31/2015 1017   UROBILINOGEN 0.2 12/28/2013 0101   NITRITE NEGATIVE 08/14/2016 1548   LEUKOCYTESUR NEGATIVE 08/14/2016 1548    Creatinine Clearance: Estimated Creatinine Clearance: 46.5 mL/min (by C-G formula based on SCr of 1.46 mg/dL (H)).  Sepsis Labs: @LABRCNTIP (procalcitonin:4,lacticidven:4) )No results found for this or any previous visit (from the past 240 hour(s)).   Radiological Exams on Admission: Dg Chest 2 View  Result Date: 08/14/2016 CLINICAL DATA:  Generalized weakness for past week EXAM: CHEST  2 VIEW COMPARISON:  05/31/2016 FINDINGS: Mild cardiomegaly. Right base atelectasis. No focal opacity on the left. No effusions or acute bony abnormality. IMPRESSION: Mild cardiomegaly.  Right base atelectasis. Electronically Signed   By: Rolm Baptise M.D.   On: 08/14/2016 15:43    EKG: Independently reviewed.   NSR with rate 81; no evidence of acute ischemia  Assessment/Plan Principal Problem:   Hypotension Active Problems:   Chronic back pain   Bipolar 1 disorder ([facility])   COPD (chronic obstructive pulmonary disease) ([facility])   ARF (acute renal failure) ([facility])   Current smoker  Respiratory failure with hypoxia ([facility])   Hyperglycemia   Alcohol abuse   Cocaine abuse   Chronic pain   Oral thrush   Hypotension -Patient initially with BP 62/39, but responded well to 3L IVF while in the ER -While this could be related to sepsis, the patient does not have any obvious infection and denies symptoms associated -Instead, suspect that this was related to acute renal failure in the setting of volume deficiency/dehydration -Her BP has already improved and she is likely appropriate for admission to telemetry -Will hold anti-hypertensive medications for now  Acute renal failure -Current creatinine 1.46, was 0.82 in 9/17 -Will rehydrate and repeat in AM  Respiratory failure with hypoxia -It is difficult to know how acute this is, since patient is supposed to be on 3L home O2 but has not been using it because it makes her house too hot -She has a h/o COPD as well as apparent OSA -She is currently appropriate on her home O2 -CXR unremarkable for PNA (although ER physician thought there may be infiltrate and covered with 1 doe of Rocephin and Azithromycin) -Will hold additional antibiotics for now, although she did have a mild leukocytosis and so this may indicate infection that is simply too early to detect on imaging -Will closely monitor on telemetry and with pulse ox with vital sign checks  Hyperglycemia -Very mild -Has had glucose in low 100 range in the past -Will check in the AM while fasting -If >140, nurse to notify physician for SSI and to check A1c  Bipolar -Patient is on a slew of mood medications - Xanax, Wellbutrin, Buspar, Cymbalta, Risperdal, and Trazodone -Reports ongoing depression -  some situational - but denies SI/HI -She is amenable to psychiatry consultation as inpatient, will order -Also has significant life stressors including desire to change her current housing situation; she is also willing to talk with social work regarding assistance in this regard  Polysubstance abuse -Patient with h/o ETOH abuse as well as seizures in the past (remote) -Last drink was 1 1/2 days prior -Mildly tremulous now, but will order CIWA just in case -Seizure precautions -Also with chronic (prescribed) BZD use -UDS positive for cocaine, which patient acknowledges -Counseling provided about the importance of cessation -Tobacco Dependence: encourage cessation.  This was discussed with the patient and should be reviewed on an ongoing basis.  Patch ordered.  Chronic pain -I have reviewed this patient in the [place] Controlled Substances Reporting System.  She is receiving his medications from multiple providers but appears to be taking them as prescribed. -Will continue home medications for now. -Of note, her UDS was NOT positive for opiates (and in fact has only been positive for opiates on 1 of 7 occasions since 2009) -Will also give limited-dose morphine 2 mg q2h prn for severe breakthrough pain  Thrush -Uncertain etiology, but appears to be substantial involvement on limited exam -Will treat with Diflucan (pharmacy to dose) -Check HIV -Other potential causes of immunocompromise include polysubstance abuse/ETOH dependence  DVT prophylaxis: Lovenox  Code Status:  Full - confirmed with patient Family Communication: None present Disposition Plan:  Home once clinically improved Consults called: Psychiatry, SW  Admission status: Admit - It is my clinical opinion that admission to INPATIENT is reasonable and necessary because this patient will require at least 2 midnights in the hospital to treat this condition based on the medical complexity of the problems presented.  Given the  aforementioned information, the predictability of an adverse outcome is felt to  be significant.    Karmen Bongo MD [facility]  If 7PM-7AM, please contact night-coverage [email or web address] Password [facility]  08/14/2016, 9:13 PM

## 2016-08-14 NOTE — ED Notes (Signed)
Pt goes to sleep unless stimulated.

## 2016-08-14 NOTE — ED Provider Notes (Signed)
[facility] DEPT Provider Note   CSN: [id number] Arrival date & time: 08/14/16  1404     History   Chief Complaint No chief complaint on file.   HPI Tiffany Lynch is a 55 y.o. female.  HPI  Patient presents with concern generalized weakness. It is unclear when this began, but the patient seemed to become generally over the past week or so. She does state that yesterday, she drank half a pint of alcohol, used half of $50 worth of cocaine, and had reduction of her weakness. Currently she denies chest pain, headache, confusion, disorientation, but she falls asleep quickly during the initial evaluation, awakens with minimal stimuli, answers questions briefly and falls asleep again quickly. She acknowledges multiple medical issues, states that she take all medication as directed, daily.    Past Medical History:  Diagnosis Date  . Anxiety   . Bipolar 1 disorder ([place])   . Chronic back pain   . Depression   . GERD (gastroesophageal reflux disease)   . History of anemia   . Hypertension   . On home oxygen therapy    3LPM PRN  . Sleep apnea     Patient Active Problem List   Diagnosis Date Noted  . Hypokalemia 05/31/2016  . Pre-syncope 05/31/2016  . Current smoker 07/20/2015  . Urge and stress incontinence 07/14/2015  . Hyperlipidemia 01/02/2015  . Insomnia 01/02/2015  . OAB (overactive bladder) 01/02/2015  . Depression 01/02/2015  . GERD (gastroesophageal reflux disease) 01/02/2015  . ARF (acute renal failure) ([place]) 12/21/2013  . COPD (chronic obstructive pulmonary disease) ([place]) 05/22/2012  . Dysfunctional uterine bleeding 04/01/2012  . Orthostatic hypotension 03/31/2012  . Encephalopathy acute 03/30/2012  . Chronic back pain   . Bipolar 1 disorder ([place])   . Anxiety     Past Surgical History:  Procedure Laterality Date  . COLONOSCOPY WITH PROPOFOL N/A 07/18/2014   Procedure: COLONOSCOPY WITH PROPOFOL;  Surgeon: Rogene Houston, MD;  Location: [facility];   Service: Endoscopy;  Laterality: N/A;  in cecum at 0753, withdrawal time 20 min  . DILATION AND CURETTAGE OF UTERUS  age 55  . POLYPECTOMY  05/22/2012   Procedure: POLYPECTOMY;  Surgeon: Jonnie Kind, MD;  Location: [facility];  Service: Gynecology;  Laterality: N/A;  Endometrial Polypectomy  . POLYPECTOMY N/A 07/18/2014   Procedure: POLYPECTOMY;  Surgeon: Rogene Houston, MD;  Location: [facility];  Service: Endoscopy;  Laterality: N/A;  . TUBAL LIGATION      OB History    Gravida Para Term Preterm AB Living   2 2 2     2    SAB TAB Ectopic Multiple Live Births                   Home Medications    Prior to Admission medications   Medication Sig Start Date End Date Taking? Authorizing Provider  ALPRAZolam Duanne Moron) 1 MG tablet Take 1 mg by mouth 4 (four) times daily as needed for anxiety.    Yes Historical Provider, MD  amLODipine (NORVASC) 5 MG tablet Take 5 mg by mouth daily. 07/29/16  Yes Historical Provider, MD  buPROPion (WELLBUTRIN XL) 300 MG 24 hr tablet Take 300 mg by mouth every morning.   Yes Historical Provider, MD  busPIRone (BUSPAR) 15 MG tablet Take 15 mg by mouth 3 (three) times daily. 07/27/16  Yes Historical Provider, MD  BUTRANS 15 MCG/HR PTWK Apply 1 patch topically once a week. 08/07/16  Yes Historical Provider, MD  cyclobenzaprine (FLEXERIL) 10 MG tablet Take 10 mg by mouth 3 (three) times daily. 07/29/16  Yes Historical Provider, MD  DULoxetine (CYMBALTA) 60 MG capsule Take 60 mg by mouth every morning.    Yes Historical Provider, MD  lisinopril-hydrochlorothiazide (PRINZIDE,ZESTORETIC) 10-12.5 MG tablet Take 1 tablet by mouth daily. 07/29/16  Yes Historical Provider, MD  loratadine (CLARITIN) 10 MG tablet Take 10 mg by mouth daily. 07/09/16  Yes Historical Provider, MD  LYRICA 100 MG capsule Take 100 mg by mouth 3 (three) times daily. 08/07/16  Yes Historical Provider, MD  omeprazole (PRILOSEC) 20 MG capsule Take 20 mg by mouth daily. 07/29/16  Yes Historical Provider,  MD  risperiDONE (RISPERDAL) 3 MG tablet Take 3 mg by mouth at bedtime. 05/23/16  Yes Historical Provider, MD  traMADol (ULTRAM) 50 MG tablet Take 1 tablet (50 mg total) by mouth every 6 (six) hours as needed. Patient taking differently: Take 50 mg by mouth every 6 (six) hours as needed for moderate pain or severe pain.  07/04/16  Yes Sanjuana Kava, MD  traZODone (DESYREL) 100 MG tablet Take 300 mg by mouth at bedtime. 07/01/16  Yes Historical Provider, MD  fluticasone (FLONASE) 50 MCG/ACT nasal spray Place 1 spray into both nostrils daily. 07/10/16   Historical Provider, MD  ibuprofen (ADVIL,MOTRIN) 600 MG tablet Take 1 tablet (600 mg total) by mouth every 6 (six) hours as needed. 06/27/16   Sanjuana Kava, MD  ibuprofen (ADVIL,MOTRIN) 800 MG tablet Take 800 mg by mouth 3 (three) times daily. 07/29/16   Historical Provider, MD    Family History Family History  Problem Relation Age of Onset  . Hypertension Mother   . Hypertension Brother   . Anxiety disorder Brother   . Depression Brother     Social History Social History  Substance Use Topics  . Smoking status: Current Every Day Smoker    Packs/day: 1.00    Years: 35.00    Types: Cigarettes  . Smokeless tobacco: Never Used  . Alcohol use Yes     Comment: occasionally     Allergies   Patient has no known allergies.   Review of Systems Review of Systems  Constitutional:       Per HPI, otherwise negative  HENT:       Per HPI, otherwise negative  Respiratory:       Per HPI, otherwise negative  Cardiovascular:       Per HPI, otherwise negative  Gastrointestinal: Positive for nausea. Negative for vomiting.  Endocrine:       Negative aside from HPI  Genitourinary:       Neg aside from HPI   Musculoskeletal:       Per HPI, otherwise negative  Skin: Negative for wound.  Neurological: Positive for weakness and light-headedness. Negative for syncope.  Psychiatric/Behavioral:       Drug use, per history of present illness      Physical Exam Updated Vital Signs BP (!) 62/39 (BP Location: Left Arm)   Pulse 80   Temp 98 F (36.7 C) (Oral)   Resp 14   Ht 5\' 3"  (1.6 m)   Wt 200 lb (90.7 kg)   LMP 05/06/2012   SpO2 93%   BMI 35.43 kg/m   Physical Exam  Constitutional: She is oriented to person, place, and time. She appears well-developed and well-nourished. No distress.  Obese F resting, awakens but falls asleep again quickly.  HENT:  Head: Normocephalic and atraumatic.  Eyes: Conjunctivae and EOM are normal.  Cardiovascular: Normal rate and regular rhythm.   Pulmonary/Chest: Effort normal and breath sounds normal. No stridor. No respiratory distress.  Abdominal: She exhibits no distension.  Musculoskeletal: She exhibits no edema.  Neurological: She is oriented to person, place, and time. No cranial nerve deficit.  Skin: Skin is warm and dry.  Psychiatric: Her speech is delayed. She is slowed and withdrawn.  Nursing note and vitals reviewed.  Initial vital signs notable for hypotension.  Patient started with empiric fluid resuscitation.   ED Treatments / Results  Labs (all labs ordered are listed, but only abnormal results are displayed) Labs Reviewed  CBC WITH DIFFERENTIAL/PLATELET - Abnormal; Notable for the following:       Result Value   WBC 13.3 (*)    Lymphs Abs 5.0 (*)    All other components within normal limits  BASIC METABOLIC PANEL  RAPID URINE DRUG SCREEN, HOSP PERFORMED  URINALYSIS, ROUTINE W REFLEX MICROSCOPIC  ETHANOL    EKG  EKG Interpretation  Date/Time:  Wednesday August 14 2016 14:14:06 EST Ventricular Rate:  81 PR Interval:    QRS Duration: 102 QT Interval:  387 QTC Calculation: 450 R Axis:   13 Text Interpretation:  Sinus rhythm Low voltage, precordial leads RSR' in V1 or V2, right VCD or RVH Otherwise within normal limits Confirmed by Carmin Muskrat  MD [phone]) on 08/14/2016 2:57:10 PM       Radiology Dg Chest 2 View  Result Date:  08/14/2016 CLINICAL DATA:  Generalized weakness for past week EXAM: CHEST  2 VIEW COMPARISON:  05/31/2016 FINDINGS: Mild cardiomegaly. Right base atelectasis. No focal opacity on the left. No effusions or acute bony abnormality. IMPRESSION: Mild cardiomegaly.  Right base atelectasis. Electronically Signed   By: Rolm Baptise M.D.   On: 08/14/2016 15:43    Procedures Procedures (including critical care time)  Medications Ordered in ED Medications  sodium chloride 0.9 % bolus 1,000 mL (not administered)  azithromycin (ZITHROMAX) 500 mg in dextrose 5 % 250 mL IVPB (not administered)  cefTRIAXone (ROCEPHIN) 1 g in dextrose 5 % 50 mL IVPB (not administered)  sodium chloride 0.9 % bolus 1,000 mL (0 mLs Intravenous Stopped 08/14/16 1854)  sodium chloride 0.9 % bolus 1,000 mL (0 mLs Intravenous Stopped 08/14/16 1703)     Initial Impression / Assessment and Plan / ED Course  I have reviewed the triage vital signs and the nursing notes.  Pertinent labs & imaging results that were available during my care of the patient were reviewed by me and considered in my medical decision making (see chart for details).  Clinical Course as of Aug 15 1931  Wed Aug 14, 2016  1546 Patient sleeping.  CR 1.4, similar to prior high values, up from recent 1  [RL]    Clinical Course User Index [RL] Carmin Muskrat, MD    5:18 PM Patient awakens briefly, falls asleep again quickly.  She has been ambulatory, and when awake, pulse ox is ~90%  7:32 PM Patient more awake, remains hypoxic, particularly with ambulation. With x-ray concerning for possible pneumonia, the patient has started antibiotics  8:06 PM I discussed admission with the patient. Patient states that she is supposed to use oxygen at home, but stopped using it because the machine the provided the oxygen made her house to hot. She does not have an idea of her pulmonary condition. Patient's blood pressure remains low, but she is mentating appropriately  when awake.  She adds that she is supposed to undergo sleep  study, for presumed sleep apnea but has yet to keep that appointment.   Final Clinical Impressions(s) / ED Diagnoses   Final diagnoses:  Community acquired pneumonia of right lower lobe of lung ([place])  Hypotension, unspecified hypotension type   Patient presents with weakness, is found to have hypotension, hypoxia. When awake patient is awake and alert oriented appropriately, denies pain, and there is low suspicion for ongoing coronary ischemia. Patient has a leukocytosis, and given the hypotension, hypoxia, there is some suspicion for pneumonia.  Symptoms also likely exacerbated by the patient's polysubstance abuse Patient received 3 L fluid resuscitation, and initiation of antibiotics, and persistently borderline  hypotension as well as hypoxia with ambulation.  patient required admission for further evaluation and management.  CRITICAL CARE Performed by: Carmin Muskrat Total critical care time: 40 minutes Critical care time was exclusive of separately billable procedures and treating other patients. Critical care was necessary to treat or prevent imminent or life-threatening deterioration. Critical care was time spent personally by me on the following activities: development of treatment plan with patient and/or surrogate as well as nursing, discussions with consultants, evaluation of patient's response to treatment, examination of patient, obtaining history from patient or surrogate, ordering and performing treatments and interventions, ordering and review of laboratory studies, ordering and review of radiographic studies, pulse oximetry and re-evaluation of patient's condition.     Carmin Muskrat, MD 08/14/16 2110

## 2016-08-14 NOTE — ED Notes (Signed)
Patient's blood pressure too low to stand.

## 2016-08-15 ENCOUNTER — Telehealth: Payer: Self-pay | Admitting: Orthopedic Surgery

## 2016-08-15 DIAGNOSIS — I959 Hypotension, unspecified: Secondary | ICD-10-CM

## 2016-08-15 LAB — BASIC METABOLIC PANEL
Anion gap: 6 (ref 5–15)
BUN: 15 mg/dL (ref 6–20)
CO2: 27 mmol/L (ref 22–32)
Calcium: 8.1 mg/dL — ABNORMAL LOW (ref 8.9–10.3)
Chloride: 103 mmol/L (ref 101–111)
Creatinine, Ser: 0.99 mg/dL (ref 0.44–1.00)
GFR calc Af Amer: >60 mL/min (ref >60)
GFR calc non Af Amer: >60 mL/min (ref >60)
Glucose, Bld: 102 mg/dL — ABNORMAL HIGH (ref 65–99)
Potassium: 3.5 mmol/L (ref 3.5–5.1)
Sodium: 136 mmol/L (ref 135–145)

## 2016-08-15 LAB — CBC
HCT: 42.8 % (ref 36.0–46.0)
Hemoglobin: 13.8 g/dL (ref 12.0–15.0)
MCH: 30.8 pg (ref 26.0–34.0)
MCHC: 32.2 g/dL (ref 30.0–36.0)
MCV: 95.5 fL (ref 78.0–100.0)
Platelets: 264 10*3/uL (ref 150–400)
RBC: 4.48 MIL/uL (ref 3.87–5.11)
RDW: 14.4 % (ref 11.5–15.5)
WBC: 11.7 10*3/uL — ABNORMAL HIGH (ref 4.0–10.5)

## 2016-08-15 MED ORDER — FLUCONAZOLE 100 MG PO TABS: 100.0000 mg | ORAL_TABLET | Freq: Every day | ORAL | 0 refills | Status: DC

## 2016-08-15 NOTE — Progress Notes (Signed)
Discharged PT per MD order and protocol. Discharge handouts reviewed/explained. Education completed.  Pt verbalized understanding and left with all belongings. VSS. IV catheter D/C. Prescriptions explained.   Patient wheeled down by staff member.

## 2016-08-15 NOTE — Discharge Instructions (Signed)
Hypotension As your heart beats, it forces blood through your body. This force is called blood pressure. If you have hypotension, you have low blood pressure. When your blood pressure is too low, you may not get enough blood to your brain. You may feel weak, feel light-headed, have a fast heartbeat, or even pass out (faint). Follow these instructions at home: Eating and drinking  Drink enough fluids to keep your pee (urine) clear or pale yellow.  Eat a healthy diet, and follow instructions from your doctor about eating or drinking restrictions. A healthy diet includes: ? Fresh fruits and vegetables. ? Whole grains. ? Low-fat (lean) meats. ? Low-fat dairy products.  Eat extra salt only as told. Do not add extra salt to your diet unless your doctor tells you to.  Eat small meals often.  Avoid standing up quickly after you eat. Medicines  Take over-the-counter and prescription medicines only as told by your doctor. ? Follow instructions from your doctor about changing how much you take (the dosage) of your medicines, if this applies. ? Do not stop or change your medicine on your own. General instructions  Wear compression stockings as told by your doctor.  Get up slowly from lying down or sitting.  Avoid hot showers and a lot of heat as told by your doctor.  Return to your normal activities as told by your doctor. Ask what activities are safe for you.  Do not use any products that contain nicotine or tobacco, such as cigarettes and e-cigarettes. If you need help quitting, ask your doctor.  Keep all follow-up visits as told by your doctor. This is important. Contact a doctor if:  You throw up (vomit).  You have watery poop (diarrhea).  You have a fever for more than 2-3 days.  You feel more thirsty than normal.  You feel weak and tired. Get help right away if:  You have chest pain.  You have a fast or irregular heartbeat.  You lose feeling (get numbness) in any part  of your body.  You cannot move your arms or your legs.  You have trouble talking.  You get sweaty or feel light-headed.  You faint.  You have trouble breathing.  You have trouble staying awake.  You feel confused. This information is not intended to replace advice given to you by your health care provider. Make sure you discuss any questions you have with your health care provider. Document Released: 11/20/2009 Document Revised: 05/14/2016 Document Reviewed: 05/14/2016 Elsevier Interactive Patient Education  2017 [employer]   

## 2016-08-15 NOTE — Discharge Summary (Signed)
Physician Discharge Summary  Tiffany Lynch [id number] DOB: Jun 24, 1961 DOA: 08/14/2016  PCP: Dionisio Paschal, NP  Admit date: 08/14/2016 Discharge date: 08/15/2016  Time spent: > 35 minutes  Recommendations for Outpatient Follow-up:  1. Consider reassessing WBC levels 2. Discontinued blood pressure medication secondary to low normal blood pressures   Discharge Diagnoses:  Principal Problem:   Hypotension Active Problems:   Chronic back pain   Bipolar 1 disorder ([facility])   COPD (chronic obstructive pulmonary disease) ([facility])   ARF (acute renal failure) ([facility])   Current smoker   Respiratory failure with hypoxia ([facility])   Hyperglycemia   Alcohol abuse   Cocaine abuse   Chronic pain   Oral thrush   Discharge Condition: stable  Diet recommendation: heart healthy diet  Filed Weights   08/14/16 1412  Weight: 90.7 kg (200 lb)    History of present illness:  55 y.o. female with medical history significant of OSA, COPD, bipolar, back and knee pain, and HTN presenting with c/o generalized weakness  Hospital Course:  Hypotension - Most likely due to poor oral intake, dehydration, continued use of blood pressure medicines and pain medication. - Resolved with IV fluid rehydration and cessation of blood pressure medicines. We'll discontinue blood pressure medication on discharge.  Otherwise we'll continue home medication regimen. We'll discontinue Xanax as I would like to avoid polypharmacy and patient is on other medications for anxiety  Procedures:  None  Consultations:  None  Discharge Exam: Vitals:   08/14/16 2259 08/15/16 0611  BP: (!) 84/49 102/64  Pulse: 84 92  Resp: 18 20  Temp: 98.3 F (36.8 C) 98.3 F (36.8 C)    General: Pt in nad, alert and awake Cardiovascular: rrr, no rubs Respiratory: no increased wob, no wheezes  Discharge Instructions   Discharge Instructions    Call MD for:  difficulty breathing, headache or visual disturbances    Complete by:  As  directed    Call MD for:  extreme fatigue    Complete by:  As directed    Call MD for:  temperature >100.4    Complete by:  As directed    Diet - low sodium heart healthy    Complete by:  As directed    Discharge instructions    Complete by:  As directed    Please follow-up with your primary care physician or urgent care center within the next week or sooner should any new concerns arise.  Also use her supplemental oxygen as recommended by your primary care physician or pulmonologist   Increase activity slowly    Complete by:  As directed      Current Discharge Medication List    START taking these medications   Details  fluconazole (DIFLUCAN) 100 MG tablet Take 1 tablet (100 mg total) by mouth daily. Qty: 4 tablet, Refills: 0      CONTINUE these medications which have NOT CHANGED   Details  buPROPion (WELLBUTRIN XL) 300 MG 24 hr tablet Take 300 mg by mouth every morning.    busPIRone (BUSPAR) 15 MG tablet Take 15 mg by mouth 3 (three) times daily. Refills: 3    BUTRANS 15 MCG/HR PTWK Apply 1 patch topically once a week. Refills: 0    DULoxetine (CYMBALTA) 60 MG capsule Take 60 mg by mouth every morning.     loratadine (CLARITIN) 10 MG tablet Take 10 mg by mouth daily. Refills: 5    LYRICA 100 MG capsule Take 100 mg by mouth 3 (three) times  daily. Refills: 0    omeprazole (PRILOSEC) 20 MG capsule Take 20 mg by mouth daily. Refills: 3    risperiDONE (RISPERDAL) 3 MG tablet Take 3 mg by mouth at bedtime. Refills: 3    traZODone (DESYREL) 100 MG tablet Take 300 mg by mouth at bedtime. Refills: 3    fluticasone (FLONASE) 50 MCG/ACT nasal spray Place 1 spray into both nostrils daily. Refills: 0      STOP taking these medications     ALPRAZolam (XANAX) 1 MG tablet      amLODipine (NORVASC) 5 MG tablet      cyclobenzaprine (FLEXERIL) 10 MG tablet      lisinopril-hydrochlorothiazide (PRINZIDE,ZESTORETIC) 10-12.5 MG tablet      traMADol (ULTRAM) 50 MG tablet       ibuprofen (ADVIL,MOTRIN) 600 MG tablet        No Known Allergies    The results of significant diagnostics from this hospitalization (including imaging, microbiology, ancillary and laboratory) are listed below for reference.    Significant Diagnostic Studies: Dg Chest 2 View  Result Date: 08/14/2016 CLINICAL DATA:  Generalized weakness for past week EXAM: CHEST  2 VIEW COMPARISON:  05/31/2016 FINDINGS: Mild cardiomegaly. Right base atelectasis. No focal opacity on the left. No effusions or acute bony abnormality. IMPRESSION: Mild cardiomegaly.  Right base atelectasis. Electronically Signed   By: Rolm Baptise M.D.   On: 08/14/2016 15:43    Microbiology: No results found for this or any previous visit (from the past 240 hour(s)).   Labs: Basic Metabolic Panel:  Recent Labs Lab 08/14/16 1435 08/15/16 0524  NA 137 136  K 3.7 3.5  CL 101 103  CO2 28 27  GLUCOSE 100* 102*  BUN 20 15  CREATININE 1.46* 0.99  CALCIUM 8.4* 8.1*   Liver Function Tests: No results for input(s): AST, ALT, ALKPHOS, BILITOT, PROT, ALBUMIN in the last 168 hours. No results for input(s): LIPASE, AMYLASE in the last 168 hours. No results for input(s): AMMONIA in the last 168 hours. CBC:  Recent Labs Lab 08/14/16 1435 08/15/16 0524  WBC 13.3* 11.7*  NEUTROABS 7.3  --   HGB 13.9 13.8  HCT 41.3 42.8  MCV 93.9 95.5  PLT 268 264   Cardiac Enzymes: No results for input(s): CKTOTAL, CKMB, CKMBINDEX, TROPONINI in the last 168 hours. BNP: BNP (last 3 results) No results for input(s): BNP in the last 8760 hours.  ProBNP (last 3 results) No results for input(s): PROBNP in the last 8760 hours.  CBG: No results for input(s): GLUCAP in the last 168 hours.   Signed:  Velvet Bathe MD.  [facility] 08/15/2016, 11:37 AM
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
## 2016-08-15 NOTE — Telephone Encounter (Signed)
Patient of Dr Brooke Bonito - per office visit 07/17/16: "Return to clinic see Dr. Aline Brochure" - please advise of appointment date/time

## 2016-08-16 LAB — HIV ANTIBODY (ROUTINE TESTING W REFLEX): HIV Screen 4th Generation wRfx: NONREACTIVE

## 2016-08-19 LAB — TSH: TSH: 2.781 u[IU]/mL (ref 0.350–4.500)

## 2016-08-26 NOTE — Telephone Encounter (Signed)
Patient aware of scheduled appointment with Dr Aline Brochure 08/30/16; 10:30a.m.

## 2016-08-30 ENCOUNTER — Ambulatory Visit: Payer: Medicaid Other | Admitting: Orthopedic Surgery

## 2016-09-17 ENCOUNTER — Encounter: Payer: Self-pay | Admitting: Orthopedic Surgery

## 2016-09-17 ENCOUNTER — Ambulatory Visit (INDEPENDENT_AMBULATORY_CARE_PROVIDER_SITE_OTHER): Payer: Medicaid Other | Admitting: Orthopedic Surgery

## 2016-09-17 VITALS — BP 148/103 | HR 89 | Wt 210.0 lb

## 2016-09-17 DIAGNOSIS — M25562 Pain in left knee: Secondary | ICD-10-CM | POA: Diagnosis not present

## 2016-09-17 DIAGNOSIS — M23322 Other meniscus derangements, posterior horn of medial meniscus, left knee: Secondary | ICD-10-CM

## 2016-09-17 DIAGNOSIS — G8929 Other chronic pain: Secondary | ICD-10-CM

## 2016-09-17 DIAGNOSIS — M2342 Loose body in knee, left knee: Secondary | ICD-10-CM | POA: Diagnosis not present

## 2016-09-17 DIAGNOSIS — M1712 Unilateral primary osteoarthritis, left knee: Secondary | ICD-10-CM | POA: Diagnosis not present

## 2016-09-17 NOTE — Progress Notes (Signed)
Patient ID: Tiffany Lynch, female   DOB: 04-24-1961, 56 y.o.   MRN: [id number]  Chief Complaint  Patient presents with  . Knee Problem    LEFT KNEE, REFERRED BY DR Luna Glasgow    HPI Tiffany Lynch is a 56 y.o. female.  Preoperative evaluation for arthroscopy left knee  Patient previously seen in this office by Dr. Luna Glasgow worked up for medial knee pain which began after she had several falls. She was treated with nonoperative care and failed eventually had MRI which showed large chondral defect medial femoral condyle, synovitis, loose body, torn medial meniscus  She presents for possible surgery  Her main problem is moderate to severe left knee pain loss of function in activities of daily living  Review of Systems Review of Systems  Constitutional: Negative for chills and fever.  Musculoskeletal: Positive for gait problem.  Neurological: Negative for weakness.     Past Medical History:  Diagnosis Date  . Anxiety   . Bipolar 1 disorder ([place])   . Chronic back pain   . COPD (chronic obstructive pulmonary disease) ([place])   . Depression   . GERD (gastroesophageal reflux disease)   . History of anemia   . Hypertension   . On home oxygen therapy    3LPM PRN  . Sleep apnea     Past Surgical History:  Procedure Laterality Date  . COLONOSCOPY WITH PROPOFOL N/A 07/18/2014   Procedure: COLONOSCOPY WITH PROPOFOL;  Surgeon: Rogene Houston, MD;  Location: [facility];  Service: Endoscopy;  Laterality: N/A;  in cecum at 0753, withdrawal time 20 min  . DILATION AND CURETTAGE OF UTERUS  age 65  . POLYPECTOMY  05/22/2012   Procedure: POLYPECTOMY;  Surgeon: Jonnie Kind, MD;  Location: [facility];  Service: Gynecology;  Laterality: N/A;  Endometrial Polypectomy  . POLYPECTOMY N/A 07/18/2014   Procedure: POLYPECTOMY;  Surgeon: Rogene Houston, MD;  Location: [facility];  Service: Endoscopy;  Laterality: N/A;  . TUBAL LIGATION      Social History Social History  Substance Use Topics  . Smoking  status: Current Every Day Smoker    Packs/day: 1.00    Years: 35.00    Types: Cigarettes  . Smokeless tobacco: Never Used  . Alcohol use Yes     Comment: occasionally    No Known Allergies  Current Meds  Medication Sig  . ALPRAZOLAM PO Take by mouth.  Marland Kitchen buPROPion (WELLBUTRIN XL) 300 MG 24 hr tablet Take 300 mg by mouth every morning.  . busPIRone (BUSPAR) 15 MG tablet Take 15 mg by mouth 3 (three) times daily.  Marland Kitchen BUTRANS 15 MCG/HR PTWK Apply 1 patch topically once a week.  . Cyclobenzaprine HCl (FLEXERIL PO) Take by mouth.  . DULoxetine (CYMBALTA) 60 MG capsule Take 60 mg by mouth every morning.   . fluconazole (DIFLUCAN) 100 MG tablet Take 1 tablet (100 mg total) by mouth daily.  . fluticasone (FLONASE) 50 MCG/ACT nasal spray Place 1 spray into both nostrils daily.  Marland Kitchen loratadine (CLARITIN) 10 MG tablet Take 10 mg by mouth daily.  Marland Kitchen LYRICA 100 MG capsule Take 100 mg by mouth 3 (three) times daily.  Marland Kitchen omeprazole (PRILOSEC) 20 MG capsule Take 20 mg by mouth daily.  . risperiDONE (RISPERDAL) 3 MG tablet Take 3 mg by mouth at bedtime.  . TRAMADOL HCL PO Take by mouth.  . traZODone (DESYREL) 100 MG tablet Take 300 mg by mouth at bedtime.      Physical Exam Physical  Exam BP (!) 148/103   Pulse 89   Wt 210 lb (95.3 kg)   LMP 05/06/2012   BMI 37.20 kg/m   Gen. appearance. The patient is well-developed and well-nourished, grooming and hygiene are normal. There are no gross congenital abnormalities  The patient is alert and oriented to person place and time  Mood and affect are normal  Ambulation Abnormal gait pattern favoring the left knee  Examination reveals the following: On inspection we find tenderness over the medial femoral condyle and medial joint line, mild tenderness lateral joint line  With the range of motion of  0-95 degrees  Stability tests were normal  in all planes  Strength tests revealed grade 5 motor strength  Skin we find no rash ulceration or  erythema  Sensation remains intact  Impression vascular system shows no peripheral edema  Data Reviewed X-rays and MRI reviewed  X-ray is noted for very minimal changes however in the medial femoral condyle there is suggestion of perhaps some subchondral bone abnormality  MRI shows large chondral defect medial femoral condyle loose body in torn medial meniscus  Assessment    Torn medial meniscus Osteoarthritis Osteochondral defect with possible loose body left knee  Encounter Diagnoses  Name Primary?  . Chronic pain of left knee Yes  . Derangement of posterior horn of medial meniscus of left knee   . Loose body in knee, left knee   . Primary osteoarthritis of left knee        Plan    Arthroscopy left knee microfracture medial femoral condyle This procedure has been fully reviewed with the patient and written informed consent has been obtained.        Arther Abbott 09/17/2016, 2:55 PM

## 2016-09-17 NOTE — Patient Instructions (Signed)
You have decided to proceed with operative arthroscopy of the knee. You have decided not to continue with nonoperative measures such as but not limited to oral medication, weight loss, activity modification, physical therapy, bracing, or injection.  We will perform operative arthroscopy of the knee. Some of the risks associated with arthroscopic surgery of the knee include but are not limited to Bleeding Infection Swelling Stiffness Blood clot Pain  If you're not comfortable with these risks and would like to continue with nonoperative treatment please let Dr. Isiaah Cuervo know prior to your surgery. 

## 2016-09-23 ENCOUNTER — Other Ambulatory Visit: Payer: Self-pay | Admitting: *Deleted

## 2016-09-30 NOTE — Patient Instructions (Signed)
Tiffany Lynch  09/30/2016     @PREFPERIOPPHARMACY @   Your procedure is scheduled on  10/10/2016  Report to [facility] at  14  A.M.  Call this number if you have problems the morning of surgery:  [phone]   Remember:  Do not eat food or drink liquids after midnight.  Take these medicines the morning of surgery with A SIP OF WATER  Xanax, wellbutrin, buspar, cymbalta, claritin, lyrica, prilosec, resperdal, tramdol.   Do not wear jewelry, make-up or nail polish.  Do not wear lotions, powders, or perfumes, or deoderant.  Do not shave 48 hours prior to surgery.  Men may shave face and neck.  Do not bring valuables to the hospital.  [facility] is not responsible for any belongings or valuables.  Contacts, dentures or bridgework may not be worn into surgery.  Leave your suitcase in the car.  After surgery it may be brought to your room.  For patients admitted to the hospital, discharge time will be determined by your treatment team.  Patients discharged the day of surgery will not be allowed to drive home.   Name and phone number of your driver:   family Special instructions:  None  Please read over the following fact sheets that you were given. Anesthesia Post-op Instructions and Care and Recovery After Surgery      Knee Ligament Injury, Arthroscopy Arthroscopy is a surgical technique in which your health care provider examines your knee through a small, pencil-sized telescope (arthroscope). Often, repairs to injured ligaments can be done with instruments in the arthroscope. Arthroscopy is less invasive than open-knee surgery. Tell a health care provider about:  Any allergies you have.  All medicines you are taking, including vitamins, herbs, eye drops, creams, and over-the-counter medicines.  Any problems you or family members have had with anesthetic medicines.  Any blood disorders you have.  Any surgeries you have had.  Any medical conditions you  have. What are the risks? Generally, this is a safe procedure. However, as with any procedure, problems can occur. Possible problems include:  Infection.  Bleeding.  Stiffness. What happens before the procedure?  Ask your health care provider about changing or stopping any regular medicines. Avoid taking aspirin or blood thinners as directed by your health care provider.  Do not eat or drink anything after midnight the night before surgery.  If you smoke, do not smoke for at least 2 weeks before your surgery.  Do not drink alcohol starting the day before your surgery.  Let your health care provider know if you develop a cold or any infection before your surgery.  Arrange for someone to drive you home after the surgery or after your hospital stay. Also arrange for someone to help you with activities during recovery. What happens during the procedure?  Small monitors will be put on your body. They are used to check your heart, blood pressure, and oxygen levels.  An IV access tube will be put into one of your veins. Medicine will be able to flow directly into your body through this IV tube.  You might be given a medicine to help you relax (sedative).  You will be given a medicine that makes you go to sleep (general anesthetic), and a breathing tube will be placed into your lungs during the procedure.  Several small incisions are made in your knee. Saline fluid is placed into one of the incisions to expand the knee  and clear away any blood in the knee.  Your health care provider will insert the arthroscope to examine the injured knee.  During arthroscopy, your health care provider may find a partial or complete tear in a ligament.  Tools can be inserted through the other incisions to repair the injured ligaments.  The incisions are then closed with absorbable stitches and covered with dressings. What happens after the procedure?  You will be taken to the recovery area where you  will be monitored.  When you are awake, stable, and taking fluids without problems, you will be allowed to go home. This information is not intended to replace advice given to you by your health care provider. Make sure you discuss any questions you have with your health care provider. Document Released: 08/23/2000 Document Revised: 02/01/2016 Document Reviewed: 04/07/2013 Elsevier Interactive Patient Education  2017 [place]. Knee Ligament Injury, Arthroscopy, Care After Refer to this sheet in the next few weeks. These instructions provide you with information on caring for yourself after your procedure. Your health care provider may also give you more specific instructions. Your treatment has been planned according to current medical practices, but problems sometimes occur. Call your health care provider if you have any problems or questions after your procedure. What can I expect after the procedure? After your procedure, it is typical to have the following:  Pain and swelling in your knee.  Your ankle and calf may be swollen and bruised for 3-4 days.  You will be tired during your recovery and will need to rest during the day. You will return to your normal level of activity over the next month following surgery.  You may be constipated. Follow these instructions at home: Ligaments take a long time to heal. For the first several weeks, you may be instructed to limit the amount of weight you put on your leg. A removable knee immobilizer or hinged splint may be used to support your knee. You can use crutches to help you get up and move around. Recovery exercises can help you regain strength and range of motion in your knee. Increased muscle strength helps support the knee. These exercises allow a faster return to normal activities. Consult your health care provider before performing any exercise. The following suggestions may help to relieve discomfort at home after your procedure:  Apply  ice to the injured area:  Put ice in a plastic bag.  Place a towel between your skin and the bag.  Leave the ice on for 20 minutes, 2-3 times a day.  To help relieve constipation, drink 6-8 glasses of water a day and eat plenty of fruits and vegetables. You may be given a stool softener to prevent constipation.  You will be given medicine to control pain. Only take over-the-counter or prescription medicines for pain, discomfort, or fever as directed by your health care provider. Contact a health care provider if:  You have increased bleeding (more than a small spot) from your incision site.  You notice redness, swelling, or increasing pain at your incision site.  You notice pus coming from your incision.  You notice a foul smell coming from your incision or dressing.  You develop increasing stiffness or pain in your knee. Get help right away if:  You have a fever.  You develop a rash, difficulty breathing, or any allergic problems. This information is not intended to replace advice given to you by your health care provider. Make sure you discuss any questions you  have with your health care provider. Document Released: 06/16/2013 Document Revised: 02/01/2016 Document Reviewed: 04/07/2013 Elsevier Interactive Patient Education  2017 [employer]. PATIENT INSTRUCTIONS POST-ANESTHESIA  IMMEDIATELY FOLLOWING SURGERY:  Do not drive or operate machinery for the first twenty four hours after surgery.  Do not make any important decisions for twenty four hours after surgery or while taking narcotic pain medications or sedatives.  If you develop intractable nausea and vomiting or a severe headache please notify your doctor immediately.  FOLLOW-UP:  Please make an appointment with your surgeon as instructed. You do not need to follow up with anesthesia unless specifically instructed to do so.  WOUND CARE INSTRUCTIONS (if applicable):  Keep a dry clean dressing on the anesthesia/puncture  wound site if there is drainage.  Once the wound has quit draining you may leave it open to air.  Generally you should leave the bandage intact for twenty four hours unless there is drainage.  If the epidural site drains for more than 36-48 hours please call the anesthesia department.  QUESTIONS?:  Please feel free to call your physician or the hospital operator if you have any questions, and they will be happy to assist you.

## 2016-10-04 ENCOUNTER — Encounter (HOSPITAL_COMMUNITY): Payer: Self-pay

## 2016-10-04 ENCOUNTER — Other Ambulatory Visit (HOSPITAL_COMMUNITY): Payer: Self-pay | Admitting: Neurology

## 2016-10-04 ENCOUNTER — Ambulatory Visit (HOSPITAL_COMMUNITY)
Admission: RE | Admit: 2016-10-04 | Discharge: 2016-10-04 | Disposition: A | Discharge status: Home / Self Care | Payer: Medicaid Other | Source: Ambulatory Visit | Attending: Neurology | Admitting: Neurology

## 2016-10-04 ENCOUNTER — Encounter (HOSPITAL_COMMUNITY)
Admission: RE | Admit: 2016-10-04 | Discharge: 2016-10-04 | Disposition: A | Discharge status: Home / Self Care | Payer: Medicaid Other | Source: Ambulatory Visit | Attending: Orthopedic Surgery | Admitting: Orthopedic Surgery

## 2016-10-04 DIAGNOSIS — M2578 Osteophyte, vertebrae: Secondary | ICD-10-CM | POA: Diagnosis not present

## 2016-10-04 DIAGNOSIS — M545 Low back pain: Secondary | ICD-10-CM | POA: Insufficient documentation

## 2016-10-04 DIAGNOSIS — M1288 Other specific arthropathies, not elsewhere classified, other specified site: Secondary | ICD-10-CM | POA: Insufficient documentation

## 2016-10-04 DIAGNOSIS — R52 Pain, unspecified: Secondary | ICD-10-CM

## 2016-10-04 DIAGNOSIS — Z01812 Encounter for preprocedural laboratory examination: Secondary | ICD-10-CM | POA: Insufficient documentation

## 2016-10-04 DIAGNOSIS — I700 Atherosclerosis of aorta: Secondary | ICD-10-CM | POA: Diagnosis not present

## 2016-10-04 LAB — CBC
HCT: 42.9 % (ref 36.0–46.0)
Hemoglobin: 14.3 g/dL (ref 12.0–15.0)
MCH: 31.1 pg (ref 26.0–34.0)
MCHC: 33.3 g/dL (ref 30.0–36.0)
MCV: 93.3 fL (ref 78.0–100.0)
Platelets: 252 10*3/uL (ref 150–400)
RBC: 4.6 MIL/uL (ref 3.87–5.11)
RDW: 14.3 % (ref 11.5–15.5)
WBC: 8.7 10*3/uL (ref 4.0–10.5)

## 2016-10-04 LAB — BASIC METABOLIC PANEL
Anion gap: 6 (ref 5–15)
BUN: 16 mg/dL (ref 6–20)
CO2: 33 mmol/L — ABNORMAL HIGH (ref 22–32)
Calcium: 8.7 mg/dL — ABNORMAL LOW (ref 8.9–10.3)
Chloride: 98 mmol/L — ABNORMAL LOW (ref 101–111)
Creatinine, Ser: 0.99 mg/dL (ref 0.44–1.00)
GFR calc Af Amer: >60 mL/min (ref >60)
GFR calc non Af Amer: >60 mL/min (ref >60)
Glucose, Bld: 104 mg/dL — ABNORMAL HIGH (ref 65–99)
Potassium: 3.4 mmol/L — ABNORMAL LOW (ref 3.5–5.1)
Sodium: 137 mmol/L (ref 135–145)

## 2016-10-09 NOTE — H&P (Signed)
Admission history and physical   Chief Complaint  Patient presents with  . Knee Problem      LEFT KNEE, REFERRED BY DR Luna Glasgow      HPI Tiffany Lynch is a 56 y.o. female.  Preoperative evaluation for arthroscopy left knee   Patient previously seen in this office by Dr. Luna Glasgow worked up for medial knee pain which began after she had several falls. She was treated with nonoperative care and failed eventually had MRI which showed large chondral defect medial femoral condyle, synovitis, loose body, torn medial meniscus   She presents for possible surgery   Her main problem is moderate to severe left knee pain loss of function in activities of daily living   Review of Systems Review of Systems  Constitutional: Negative for chills and fever.  Musculoskeletal: Positive for gait problem.  Neurological: Negative for weakness.            Past Medical History:  Diagnosis Date  . Anxiety    . Bipolar 1 disorder ([place])    . Chronic back pain    . COPD (chronic obstructive pulmonary disease) ([place])    . Depression    . GERD (gastroesophageal reflux disease)    . History of anemia    . Hypertension    . On home oxygen therapy      3LPM PRN  . Sleep apnea             Past Surgical History:  Procedure Laterality Date  . COLONOSCOPY WITH PROPOFOL N/A 07/18/2014    Procedure: COLONOSCOPY WITH PROPOFOL;  Surgeon: Rogene Houston, MD;  Location: [facility];  Service: Endoscopy;  Laterality: N/A;  in cecum at 0753, withdrawal time 20 min  . DILATION AND CURETTAGE OF UTERUS   age 41  . POLYPECTOMY   05/22/2012    Procedure: POLYPECTOMY;  Surgeon: Jonnie Kind, MD;  Location: [facility];  Service: Gynecology;  Laterality: N/A;  Endometrial Polypectomy  . POLYPECTOMY N/A 07/18/2014    Procedure: POLYPECTOMY;  Surgeon: Rogene Houston, MD;  Location: [facility];  Service: Endoscopy;  Laterality: N/A;  . TUBAL LIGATION          Social History        Social History  Substance Use Topics  . Smoking  status: Current Every Day Smoker      Packs/day: 1.00      Years: 35.00      Types: Cigarettes  . Smokeless tobacco: Never Used  . Alcohol use Yes         Comment: occasionally      No Known Allergies   Active Medications      Current Meds  Medication Sig  . ALPRAZOLAM PO Take by mouth.  Marland Kitchen buPROPion (WELLBUTRIN XL) 300 MG 24 hr tablet Take 300 mg by mouth every morning.  . busPIRone (BUSPAR) 15 MG tablet Take 15 mg by mouth 3 (three) times daily.  Marland Kitchen BUTRANS 15 MCG/HR PTWK Apply 1 patch topically once a week.  . Cyclobenzaprine HCl (FLEXERIL PO) Take by mouth.  . DULoxetine (CYMBALTA) 60 MG capsule Take 60 mg by mouth every morning.   . fluconazole (DIFLUCAN) 100 MG tablet Take 1 tablet (100 mg total) by mouth daily.  . fluticasone (FLONASE) 50 MCG/ACT nasal spray Place 1 spray into both nostrils daily.  Marland Kitchen loratadine (CLARITIN) 10 MG tablet Take 10 mg by mouth daily.  Marland Kitchen LYRICA 100 MG capsule Take 100 mg by mouth 3 (three) times  daily.  . omeprazole (PRILOSEC) 20 MG capsule Take 20 mg by mouth daily.  . risperiDONE (RISPERDAL) 3 MG tablet Take 3 mg by mouth at bedtime.  . TRAMADOL HCL PO Take by mouth.  . traZODone (DESYREL) 100 MG tablet Take 300 mg by mouth at bedtime.            Physical Exam Physical Exam BP (!) 148/103   Pulse 89   Wt 210 lb (95.3 kg)   LMP 05/06/2012   BMI 37.20 kg/m    Physical Exam  Constitutional: She appears well-developed and well-nourished.  Non-toxic appearance. No distress.  Eyes: Conjunctivae, EOM and lids are normal. Right eye exhibits no discharge and no exudate. Left eye exhibits no discharge and no exudate. Right conjunctiva is not injected. Left conjunctiva is not injected.  Neck: Trachea normal, normal range of motion and full passive range of motion without pain. Neck supple. No neck rigidity. Normal range of motion present. No thyroid mass and no thyromegaly present.  Cardiovascular: Intact distal pulses.   Pulses:      Radial  pulses are 2+ on the right side, and 2+ on the left side.       Dorsalis pedis pulses are 2+ on the right side, and 2+ on the left side.  Lymphadenopathy:       Right cervical: No superficial cervical adenopathy present.      Left cervical: No superficial cervical adenopathy present.    She has no axillary adenopathy.       Right: No supraclavicular adenopathy present.       Left: No supraclavicular adenopathy present.  Neurological: She is alert. She displays no atrophy and no tremor. No sensory deficit. She exhibits normal muscle tone. Gait abnormal. She displays no Babinski's sign on the right side. She displays no Babinski's sign on the left side.  Reflex Scores:      Bicep reflexes are 2+ on the right side and 2+ on the left side.      Patellar reflexes are 2+ on the right side and 2+ on the left side. Skin: Skin is warm, dry and intact. No abrasion, no bruising, no ecchymosis and no laceration noted. Rash is not nodular. She is not diaphoretic. No erythema.  Psychiatric: She has a normal mood and affect. Her speech is normal and behavior is normal. Judgment and thought content normal. She is attentive.  Vitals reviewed.    Ambulation Abnormal gait pattern favoring the left knee   Examination reveals the following: On inspection we find tenderness over the medial femoral condyle and medial joint line, mild tenderness lateral joint line   With the range of motion of  0-95 degrees   Stability tests were normal  in all planes   Strength tests revealed grade 5 motor strength   Skin we find no rash ulceration or erythema   Sensation remains intact   Impression vascular system shows no peripheral edema   Data Reviewed X-rays and MRI reviewed   X-ray is noted for very minimal changes however in the medial femoral condyle there is suggestion of perhaps some subchondral bone abnormality   MRI shows large chondral defect medial femoral condyle loose body with torn medial meniscus    Assessment    Torn medial meniscus Osteoarthritis Osteochondral defect with possible loose body left knee       Encounter Diagnoses  Name Primary?  . Chronic pain of left knee Yes  . Derangement of posterior horn of medial meniscus of  left knee    . Loose body in knee, left knee    . Primary osteoarthritis of left knee          Plan    Arthroscopy left knee microfracture medial femoral condyle And partial medial meniscectomy This procedure has been fully reviewed with the patient and written informed consent has been obtained.  10/09/2016 2:47 PM

## 2016-10-10 ENCOUNTER — Encounter (HOSPITAL_COMMUNITY): Payer: Self-pay | Admitting: *Deleted

## 2016-10-10 ENCOUNTER — Ambulatory Visit (HOSPITAL_COMMUNITY)
Admission: RE | Admit: 2016-10-10 | Discharge: 2016-10-10 | Disposition: A | Discharge status: Home / Self Care | Payer: Medicaid Other | Source: Ambulatory Visit | Attending: Orthopedic Surgery | Admitting: Orthopedic Surgery

## 2016-10-10 ENCOUNTER — Encounter (HOSPITAL_COMMUNITY)
Admission: RE | Disposition: A | Discharge status: Home / Self Care | Payer: Self-pay | Source: Ambulatory Visit | Attending: Orthopedic Surgery

## 2016-10-10 ENCOUNTER — Ambulatory Visit (HOSPITAL_COMMUNITY): Payer: Medicaid Other | Admitting: Anesthesiology

## 2016-10-10 DIAGNOSIS — G4730 Sleep apnea, unspecified: Secondary | ICD-10-CM | POA: Diagnosis not present

## 2016-10-10 DIAGNOSIS — F319 Bipolar disorder, unspecified: Secondary | ICD-10-CM | POA: Diagnosis not present

## 2016-10-10 DIAGNOSIS — K219 Gastro-esophageal reflux disease without esophagitis: Secondary | ICD-10-CM | POA: Insufficient documentation

## 2016-10-10 DIAGNOSIS — I10 Essential (primary) hypertension: Secondary | ICD-10-CM | POA: Diagnosis not present

## 2016-10-10 DIAGNOSIS — M549 Dorsalgia, unspecified: Secondary | ICD-10-CM | POA: Diagnosis not present

## 2016-10-10 DIAGNOSIS — F419 Anxiety disorder, unspecified: Secondary | ICD-10-CM | POA: Insufficient documentation

## 2016-10-10 DIAGNOSIS — Z9981 Dependence on supplemental oxygen: Secondary | ICD-10-CM | POA: Diagnosis not present

## 2016-10-10 DIAGNOSIS — J449 Chronic obstructive pulmonary disease, unspecified: Secondary | ICD-10-CM | POA: Insufficient documentation

## 2016-10-10 DIAGNOSIS — Z79899 Other long term (current) drug therapy: Secondary | ICD-10-CM | POA: Insufficient documentation

## 2016-10-10 DIAGNOSIS — S83242A Other tear of medial meniscus, current injury, left knee, initial encounter: Secondary | ICD-10-CM | POA: Insufficient documentation

## 2016-10-10 DIAGNOSIS — G8929 Other chronic pain: Secondary | ICD-10-CM | POA: Diagnosis not present

## 2016-10-10 DIAGNOSIS — X58XXXA Exposure to other specified factors, initial encounter: Secondary | ICD-10-CM | POA: Diagnosis not present

## 2016-10-10 DIAGNOSIS — M2342 Loose body in knee, left knee: Secondary | ICD-10-CM

## 2016-10-10 DIAGNOSIS — F17210 Nicotine dependence, cigarettes, uncomplicated: Secondary | ICD-10-CM | POA: Diagnosis not present

## 2016-10-10 DIAGNOSIS — M23322 Other meniscus derangements, posterior horn of medial meniscus, left knee: Secondary | ICD-10-CM | POA: Diagnosis not present

## 2016-10-10 HISTORY — PX: KNEE ARTHROSCOPY WITH MEDIAL MENISECTOMY: SHX5651

## 2016-10-10 LAB — RAPID URINE DRUG SCREEN, HOSP PERFORMED
Amphetamines: NOT DETECTED
Barbiturates: NOT DETECTED
Benzodiazepines: POSITIVE — AB
Cocaine: NOT DETECTED
Opiates: NOT DETECTED
Tetrahydrocannabinol: NOT DETECTED

## 2016-10-10 SURGERY — ARTHROSCOPY, KNEE, WITH MEDIAL MENISCECTOMY
Anesthesia: General | Laterality: Left

## 2016-10-10 MED ORDER — PROPOFOL 10 MG/ML IV BOLUS
INTRAVENOUS | Status: DC | PRN
  Administered 2016-10-10 (×2): 50 mg via INTRAVENOUS
  Administered 2016-10-10: 150 mg via INTRAVENOUS

## 2016-10-10 MED ORDER — LABETALOL HCL 5 MG/ML IV SOLN
INTRAVENOUS | Status: AC
  Filled 2016-10-10: qty 4

## 2016-10-10 MED ORDER — LACTATED RINGERS IV SOLN
INTRAVENOUS | Status: DC
  Administered 2016-10-10 (×2): via INTRAVENOUS

## 2016-10-10 MED ORDER — MIDAZOLAM HCL 5 MG/5ML IJ SOLN
INTRAMUSCULAR | Status: DC | PRN
  Administered 2016-10-10: 2 mg via INTRAVENOUS

## 2016-10-10 MED ORDER — BUPIVACAINE-EPINEPHRINE (PF) 0.25% -1:200000 IJ SOLN
INTRAMUSCULAR | Status: AC
  Filled 2016-10-10: qty 30

## 2016-10-10 MED ORDER — MIDAZOLAM HCL 2 MG/2ML IJ SOLN
INTRAMUSCULAR | Status: AC
  Filled 2016-10-10: qty 2

## 2016-10-10 MED ORDER — IBUPROFEN 800 MG PO TABS
800.0000 mg | ORAL_TABLET | Freq: Once | ORAL | Status: AC
  Administered 2016-10-10: 800 mg via ORAL
  Filled 2016-10-10: qty 1

## 2016-10-10 MED ORDER — EPINEPHRINE PF 1 MG/ML IJ SOLN
INTRAMUSCULAR | Status: AC
  Filled 2016-10-10: qty 5

## 2016-10-10 MED ORDER — CEFAZOLIN SODIUM-DEXTROSE 2-4 GM/100ML-% IV SOLN
2.0000 g | INTRAVENOUS | Status: AC
  Administered 2016-10-10: 2 g via INTRAVENOUS
  Filled 2016-10-10: qty 100

## 2016-10-10 MED ORDER — EPHEDRINE SULFATE 50 MG/ML IJ SOLN
INTRAMUSCULAR | Status: AC
  Filled 2016-10-10: qty 1

## 2016-10-10 MED ORDER — LABETALOL HCL 5 MG/ML IV SOLN
10.0000 mg | Freq: Once | INTRAVENOUS | Status: AC
  Administered 2016-10-10: 10 mg via INTRAVENOUS

## 2016-10-10 MED ORDER — PHENYLEPHRINE 40 MCG/ML (10ML) SYRINGE FOR IV PUSH (FOR BLOOD PRESSURE SUPPORT)
PREFILLED_SYRINGE | INTRAVENOUS | Status: AC
  Filled 2016-10-10: qty 10

## 2016-10-10 MED ORDER — CHLORHEXIDINE GLUCONATE 4 % EX LIQD: 60.0000 mL | Freq: Once | CUTANEOUS | Status: DC

## 2016-10-10 MED ORDER — HYDROMORPHONE HCL 1 MG/ML IJ SOLN
INTRAMUSCULAR | Status: AC
  Filled 2016-10-10: qty 0.5

## 2016-10-10 MED ORDER — ARTIFICIAL TEARS OP OINT
TOPICAL_OINTMENT | OPHTHALMIC | Status: AC
  Filled 2016-10-10: qty 3.5

## 2016-10-10 MED ORDER — LIDOCAINE HCL (CARDIAC) 10 MG/ML IV SOLN
INTRAVENOUS | Status: DC | PRN
  Administered 2016-10-10: 30 mg via INTRAVENOUS

## 2016-10-10 MED ORDER — BUPIVACAINE IN DEXTROSE 0.75-8.25 % IT SOLN
INTRATHECAL | Status: AC
  Filled 2016-10-10: qty 2

## 2016-10-10 MED ORDER — LIDOCAINE HCL (PF) 1 % IJ SOLN
INTRAMUSCULAR | Status: AC
  Filled 2016-10-10: qty 5

## 2016-10-10 MED ORDER — ONDANSETRON HCL 4 MG/2ML IJ SOLN
4.0000 mg | Freq: Once | INTRAMUSCULAR | Status: AC
  Administered 2016-10-10: 4 mg via INTRAVENOUS
  Filled 2016-10-10: qty 2

## 2016-10-10 MED ORDER — PROPOFOL 10 MG/ML IV BOLUS
INTRAVENOUS | Status: AC
Start: 2016-10-10 — End: 2016-10-10
  Filled 2016-10-10: qty 40

## 2016-10-10 MED ORDER — FENTANYL CITRATE (PF) 100 MCG/2ML IJ SOLN
INTRAMUSCULAR | Status: DC | PRN
  Administered 2016-10-10 (×2): 50 ug via INTRAVENOUS
  Administered 2016-10-10 (×2): 25 ug via INTRAVENOUS
  Administered 2016-10-10 (×2): 50 ug via INTRAVENOUS

## 2016-10-10 MED ORDER — HYDROMORPHONE HCL 1 MG/ML IJ SOLN
0.2500 mg | INTRAMUSCULAR | Status: DC | PRN
  Administered 2016-10-10: 0.5 mg via INTRAVENOUS

## 2016-10-10 MED ORDER — HYDROCODONE-ACETAMINOPHEN 5-325 MG PO TABS: 1.0000 | ORAL_TABLET | ORAL | 0 refills | Status: DC | PRN

## 2016-10-10 MED ORDER — HYDROCODONE-ACETAMINOPHEN 7.5-325 MG PO TABS
1.0000 | ORAL_TABLET | Freq: Once | ORAL | Status: AC
  Administered 2016-10-10: 1 via ORAL
  Filled 2016-10-10: qty 1

## 2016-10-10 MED ORDER — SODIUM CHLORIDE 0.9 % IR SOLN
Status: DC | PRN
  Administered 2016-10-10: 3 mL

## 2016-10-10 MED ORDER — MIDAZOLAM HCL 2 MG/2ML IJ SOLN
1.0000 mg | INTRAMUSCULAR | Status: DC | PRN
  Administered 2016-10-10: 2 mg via INTRAVENOUS

## 2016-10-10 MED ORDER — BUPIVACAINE-EPINEPHRINE (PF) 0.25% -1:200000 IJ SOLN
INTRAMUSCULAR | Status: DC | PRN
  Administered 2016-10-10: 60 mL via PERINEURAL

## 2016-10-10 MED ORDER — SODIUM CHLORIDE 0.9 % IJ SOLN
INTRAMUSCULAR | Status: AC
Start: 2016-10-10 — End: 2016-10-10
  Filled 2016-10-10: qty 10

## 2016-10-10 MED ORDER — ONDANSETRON HCL 4 MG/2ML IJ SOLN
INTRAMUSCULAR | Status: AC
  Filled 2016-10-10: qty 2

## 2016-10-10 MED ORDER — FENTANYL CITRATE (PF) 250 MCG/5ML IJ SOLN
INTRAMUSCULAR | Status: AC
  Filled 2016-10-10: qty 5

## 2016-10-10 MED ORDER — KETOROLAC TROMETHAMINE 30 MG/ML IJ SOLN
30.0000 mg | Freq: Once | INTRAMUSCULAR | Status: AC
  Administered 2016-10-10: 30 mg via INTRAVENOUS
  Filled 2016-10-10: qty 1

## 2016-10-10 MED ORDER — ONDANSETRON HCL 4 MG/2ML IJ SOLN
4.0000 mg | Freq: Once | INTRAMUSCULAR | Status: AC
  Administered 2016-10-10: 4 mg via INTRAVENOUS

## 2016-10-10 MED ORDER — SODIUM CHLORIDE 0.9 % IR SOLN
Status: DC | PRN
  Administered 2016-10-10: 9000 mL

## 2016-10-10 SURGICAL SUPPLY — 55 items
ARTHROWAND PARAGON T2 (SURGICAL WAND)
BAG HAMPER (MISCELLANEOUS) ×2 IMPLANT
BANDAGE ACE 4X5 VEL STRL LF (GAUZE/BANDAGES/DRESSINGS) ×1 IMPLANT
BANDAGE ELASTIC 6 VELCRO NS (GAUZE/BANDAGES/DRESSINGS) ×2 IMPLANT
BLADE AGGRESSIVE PLUS 4.0 (BLADE) ×2 IMPLANT
BLADE SURG SZ11 CARB STEEL (BLADE) ×2 IMPLANT
CHLORAPREP W/TINT 26ML (MISCELLANEOUS) ×2 IMPLANT
CLOTH BEACON ORANGE TIMEOUT ST (SAFETY) ×2 IMPLANT
COOLER CRYO IC GRAV AND TUBE (ORTHOPEDIC SUPPLIES) ×2 IMPLANT
CUFF CRYO KNEE LG 20X31 COOLER (ORTHOPEDIC SUPPLIES) ×1 IMPLANT
CUFF CRYO KNEE18X23 MED (MISCELLANEOUS) IMPLANT
CUFF TOURNIQUET SINGLE 34IN LL (TOURNIQUET CUFF) ×1 IMPLANT
CUFF TOURNIQUET SINGLE 44IN (TOURNIQUET CUFF) IMPLANT
CUTTER ANGLED DBL BITE 4.5 (BURR) ×1 IMPLANT
DECANTER SPIKE VIAL GLASS SM (MISCELLANEOUS) ×4 IMPLANT
DRESSING XEROFORM 5X9 (GAUZE/BANDAGES/DRESSINGS) ×1 IMPLANT
DRSG PAD ABDOMINAL 8X10 ST (GAUZE/BANDAGES/DRESSINGS) ×2 IMPLANT
GAUZE SPONGE 4X4 12PLY STRL (GAUZE/BANDAGES/DRESSINGS) ×2 IMPLANT
GAUZE SPONGE 4X4 16PLY XRAY LF (GAUZE/BANDAGES/DRESSINGS) ×2 IMPLANT
GAUZE XEROFORM 5X9 LF (GAUZE/BANDAGES/DRESSINGS) ×2 IMPLANT
GLOVE BIOGEL PI IND STRL 7.0 (GLOVE) ×1 IMPLANT
GLOVE BIOGEL PI INDICATOR 7.0 (GLOVE) ×1
GLOVE SKINSENSE NS SZ8.0 LF (GLOVE) ×1
GLOVE SKINSENSE STRL SZ8.0 LF (GLOVE) ×1 IMPLANT
GLOVE SS N UNI LF 8.5 STRL (GLOVE) ×2 IMPLANT
GOWN STRL REUS W/ TWL LRG LVL3 (GOWN DISPOSABLE) ×1 IMPLANT
GOWN STRL REUS W/TWL LRG LVL3 (GOWN DISPOSABLE) ×2
GOWN STRL REUS W/TWL XL LVL3 (GOWN DISPOSABLE) ×2 IMPLANT
HLDR LEG FOAM (MISCELLANEOUS) ×1 IMPLANT
IV NS IRRIG 3000ML ARTHROMATIC (IV SOLUTION) ×5 IMPLANT
KIT BLADEGUARD II DBL (SET/KITS/TRAYS/PACK) ×2 IMPLANT
KIT ROOM TURNOVER AP CYSTO (KITS) ×2 IMPLANT
LEG HOLDER FOAM (MISCELLANEOUS) ×1
MANIFOLD NEPTUNE II (INSTRUMENTS) ×2 IMPLANT
MARKER SKIN DUAL TIP RULER LAB (MISCELLANEOUS) ×2 IMPLANT
NEEDLE HYPO 18GX1.5 BLUNT FILL (NEEDLE) ×2 IMPLANT
NEEDLE HYPO 21X1.5 SAFETY (NEEDLE) ×2 IMPLANT
NEEDLE SPNL 18GX3.5 QUINCKE PK (NEEDLE) ×2 IMPLANT
NS IRRIG 1000ML POUR BTL (IV SOLUTION) ×2 IMPLANT
PACK ARTHRO LIMB DRAPE STRL (MISCELLANEOUS) ×2 IMPLANT
PAD ABD 5X9 TENDERSORB (GAUZE/BANDAGES/DRESSINGS) ×2 IMPLANT
PAD ARMBOARD 7.5X6 YLW CONV (MISCELLANEOUS) ×2 IMPLANT
PADDING CAST COTTON 6X4 STRL (CAST SUPPLIES) ×2 IMPLANT
PADDING WEBRIL 4 STERILE (GAUZE/BANDAGES/DRESSINGS) ×1 IMPLANT
SET ARTHROSCOPY INST (INSTRUMENTS) ×2 IMPLANT
SET ARTHROSCOPY PUMP TUBE (IRRIGATION / IRRIGATOR) ×2 IMPLANT
SET BASIN LINEN APH (SET/KITS/TRAYS/PACK) ×2 IMPLANT
SPONGE GAUZE 4X4 12PLY (GAUZE/BANDAGES/DRESSINGS) ×1 IMPLANT
SUT ETHILON 3 0 FSL (SUTURE) ×2 IMPLANT
SYR 30ML LL (SYRINGE) ×2 IMPLANT
SYRINGE 10CC LL (SYRINGE) ×2 IMPLANT
TUBE CONNECTING 12X1/4 (SUCTIONS) ×6 IMPLANT
WAND 50 DEG COVAC W/CORD (SURGICAL WAND) ×2 IMPLANT
WAND 90 DEG TURBOVAC W/CORD (SURGICAL WAND) IMPLANT
WAND ARTHRO PARAGON T2 (SURGICAL WAND) IMPLANT

## 2016-10-10 NOTE — Brief Op Note (Signed)
10/10/2016  1:31 PM  PATIENT:  Tiffany Lynch  56 y.o. female  PRE-OPERATIVE DIAGNOSIS:  LEFT KNEE MEDIAL MENISCUS TEAR AND LOOSE BODY  POST-OPERATIVE DIAGNOSIS:  LEFT KNEE MEDIAL MENISCUS TEAR AND LOOSE BODY  PROCEDURE:  Procedure(s): KNEE ARTHROSCOPY WITH MEDIAL MENISECTOMY REMOVAL LOSE BODY (Left)   Surgical findings grade 4 cartilage loss of the medial femoral condyle on contain lesion with a grade 4 lesion of the tibial plateau is extensive torn medial meniscus  Anterior cruciate ligament PCL lateral compartment normal patellofemoral joint mild arthritis  Entire joint extensive synovitis and multiple loose bodies    SURGEON:  Surgeon(s) and Role:    * Carole Civil, MD - Primary  PHYSICIAN ASSISTANT:   ASSISTANTS: none   ANESTHESIA:   general  EBL:  Total I/O In: 1000 [I.V.:1000] Out: 5 [Blood:5]  BLOOD ADMINISTERED:none  DRAINS: none   LOCAL MEDICATIONS USED:  MARCAINE     SPECIMEN:  No Specimen  DISPOSITION OF SPECIMEN:  N/A  COUNTS:  YES  TOURNIQUET:  * No tourniquets in log *  DICTATION: .Dragon Dictation  PLAN OF CARE: Discharge to home after PACU  PATIENT DISPOSITION:  PACU - hemodynamically stable.   Delay start of Pharmacological VTE agent (>24hrs) due to surgical blood loss or risk of bleeding: not applicable  [id number]

## 2016-10-10 NOTE — Discharge Instructions (Signed)
Knee Arthroscopy, Care After Refer to this sheet in the next few weeks. These instructions provide you with information about caring for yourself after your procedure. Your health care provider may also give you more specific instructions. Your treatment has been planned according to current medical practices, but problems sometimes occur. Call your health care provider if you have any problems or questions after your procedure. What can I expect after the procedure? After the procedure, it is common to have:  Soreness.  Pain. Follow these instructions at home: Bathing  Do not take baths, swim, or use a hot tub until your health care provider approves. Incision care  There are many different ways to close and cover an incision, including stitches, skin glue, and adhesive strips. Follow your health care providers instructions about:  Incision care.  Bandage (dressing) changes and removal.  Incision closure removal.  Check your incision area every day for signs of infection. Watch for:  Redness, swelling, or pain.  Fluid, blood, or pus. Activity  Avoid strenuous activities for as long as directed by your health care provider.  Return to your normal activities as directed by your health care provider. Ask your health care provider what activities are safe for you.  Perform range-of-motion exercises only as directed by your health care provider.  Do not lift anything that is heavier than 10 lb (4.5 kg).  Do not drive or operate heavy machinery while taking pain medicine.  If you were given crutches, use them as directed by your health care provider. Managing pain, stiffness, and swelling  If directed, apply ice to the injured area:  Put ice in a plastic bag.  Place a towel between your skin and the bag.  Leave the ice on for 20 minutes, 2-3 times per day.  Raise the injured area above the level of your heart while you are sitting or lying down as directed by your health  care provider. General instructions  Keep all follow-up visits as directed by your health care provider. This is important.  Take medicines only as directed by your health care provider.  Do not use any tobacco products, including cigarettes, chewing tobacco, or electronic cigarettes. If you need help quitting, ask your health care provider.  If you were given compression stockings, wear them as directed by your health care provider. These stockings help prevent blood clots and reduce swelling in your legs. Contact a health care provider if:  You have severe pain with any movement of your knee.  You notice a bad smell coming from the incision or dressing.  You have redness, swelling, or pain at the site of your incision.  You have fluid, blood, or pus coming from your incision. Get help right away if:  You develop a rash.  You have a fever.  You have difficulty breathing or have shortness of breath.  You develop pain in your calves or in the back of your knee.  You develop chest pain.  You develop numbness or tingling in your leg or foot. This information is not intended to replace advice given to you by your health care provider. Make sure you discuss any questions you have with your health care provider. Document Released: 03/15/2005 Document Revised: 01/26/2016 Document Reviewed: 08/22/2014 Elsevier Interactive Patient Education  2017 [employer]   PATIENT INSTRUCTIONS POST-ANESTHESIA  IMMEDIATELY FOLLOWING SURGERY:  Do not drive or operate machinery for the first twenty four hours after surgery.  Do not make any important decisions for twenty four hours  after surgery or while taking narcotic pain medications or sedatives.  If you develop intractable nausea and vomiting or a severe headache please notify your doctor immediately.  FOLLOW-UP:  Please make an appointment with your surgeon as instructed. You do not need to follow up with anesthesia unless specifically instructed  to do so.  WOUND CARE INSTRUCTIONS (if applicable):  Keep a dry clean dressing on the anesthesia/puncture wound site if there is drainage.  Once the wound has quit draining you may leave it open to air.  Generally you should leave the bandage intact for twenty four hours unless there is drainage.  If the epidural site drains for more than 36-48 hours please call the anesthesia department.  QUESTIONS?:  Please feel free to call your physician or the hospital operator if you have any questions, and they will be happy to assist you.

## 2016-10-10 NOTE — Anesthesia Preprocedure Evaluation (Signed)
Anesthesia Evaluation  Patient identified by MRN, date of birth, ID band Patient awake    Reviewed: Allergy & Precautions, H&P , NPO status , Patient's Chart, lab work & pertinent test results  Airway Mallampati: III  TM Distance: >3 FB   Mouth opening: Limited Mouth Opening  Dental  (+) Teeth Intact, Partial Lower   Pulmonary sleep apnea and Continuous Positive Airway Pressure Ventilation , COPD (recent seasonal allergies, hoarsness.), Current Smoker,     + decreased breath sounds      Cardiovascular hypertension (taken off meds 2 months ago),  Rhythm:Regular Rate:Normal     Neuro/Psych PSYCHIATRIC DISORDERS Anxiety Depression Bipolar Disorder    GI/Hepatic GERD  Medicated and Controlled,(+)     substance abuse  marijuana use,   Endo/Other    Renal/GU Renal disease     Musculoskeletal   Abdominal   Peds  Hematology   Anesthesia Other Findings   Reproductive/Obstetrics                             Anesthesia Physical Anesthesia Plan  ASA: III  Anesthesia Plan: General   Post-op Pain Management:    Induction: Intravenous  Airway Management Planned: LMA  Additional Equipment:   Intra-op Plan:   Post-operative Plan: Extubation in OR  Informed Consent: I have reviewed the patients History and Physical, chart, labs and discussed the procedure including the risks, benefits and alternatives for the proposed anesthesia with the patient or authorized representative who has indicated his/her understanding and acceptance.     Plan Discussed with:   Anesthesia Plan Comments: (Labetolol pre op for BP.)        Anesthesia Quick Evaluation

## 2016-10-10 NOTE — Op Note (Signed)
10/10/2016  1:31 PM  PATIENT:  Tiffany Lynch  56 y.o. female  PRE-OPERATIVE DIAGNOSIS:  LEFT KNEE MEDIAL MENISCUS TEAR AND LOOSE BODY  POST-OPERATIVE DIAGNOSIS:  LEFT KNEE MEDIAL MENISCUS TEAR AND LOOSE BODY  PROCEDURE:  Procedure(s): KNEE ARTHROSCOPY WITH MEDIAL MENISECTOMY REMOVAL LOSE BODY (Left)   Surgical findings grade 4 cartilage loss of the medial femoral condyle on contain lesion with a grade 4 lesion of the tibial plateau is extensive torn medial meniscus  Anterior cruciate ligament PCL lateral compartment normal patellofemoral joint mild arthritis  Entire joint extensive synovitis and multiple loose bodies    SURGEON:  Surgeon(s) and Role:    * Carole Civil, MD - Primary  PHYSICIAN ASSISTANT:   ASSISTANTS: none   ANESTHESIA:   general  EBL:  Total I/O In: 1000 [I.V.:1000] Out: 5 [Blood:5]  BLOOD ADMINISTERED:none  DRAINS: none   LOCAL MEDICATIONS USED:  MARCAINE     SPECIMEN:  No Specimen  DISPOSITION OF SPECIMEN:  N/A  COUNTS:  YES  TOURNIQUET:  * No tourniquets in log *  DICTATION: .Dragon Dictation  PLAN OF CARE: Discharge to home after PACU  PATIENT DISPOSITION:  PACU - hemodynamically stable.   Delay start of Pharmacological VTE agent (>24hrs) due to surgical blood loss or risk of bleeding: not applicable  Knee arthroscopy dictation  The patient was identified in the preoperative holding area using 2 approved identification mechanisms. The chart was reviewed and updated. The surgical site was confirmed as left knee and marked with an indelible marker.  The patient was taken to the operating room for anesthesia. After successful  genearl anesthesia,ancef was used as IV antibiotics.  The patient was placed in the supine position with the (left) the operative extremity in an arthroscopic leg holder and the opposite extremity in a padded leg holder.  The timeout was executed.  A lateral portal was established with an 11 blade and the  scope was introduced into the joint. A diagnostic arthroscopy was performed in circumferential manner examining the entire knee joint. A medial portal was established and the diagnostic arthroscopy was repeated using a probe to palpate intra-articular structures as they were encountered.    The medail meniscus was resected using a duckbill forceps. The meniscal fragments were removed with a motorized shaver. The meniscus was balanced with a combination of a motorized shaver and a 50 ArthroCare wand until a stable rim was obtained.  A thorough evaluation of the articular cartilage lesion was performed and there was no contained lesion so no microfracture was performed  The scope was switched back and forth to remove loose bodies and debrided the lateral gutter superior patellar pouch and synovitis as well as remove loose bodies.  The arthroscopic pump was placed on the wash mode and any excess debris was removed from the joint using suction.  60 cc of Marcaine with epinephrine was injected through the arthroscope.  The portals were closed with 3-0 nylon suture.  A sterile bandage, Ace wrap and Cryo/Cuff was placed and the Cryo/Cuff was activated. The patient was taken to the recovery room in stable condition.   [id number] [phone]

## 2016-10-10 NOTE — Anesthesia Procedure Notes (Signed)
Procedure Name: LMA Insertion Date/Time: 10/10/2016 12:27 PM Performed by: Andree Elk, Merrill Villarruel A Pre-anesthesia Checklist: Patient identified, Timeout performed, Emergency Drugs available, Suction available and Patient being monitored Patient Re-evaluated:Patient Re-evaluated prior to inductionOxygen Delivery Method: Circle system utilized Preoxygenation: Pre-oxygenation with 100% oxygen Intubation Type: IV induction Ventilation: Mask ventilation without difficulty LMA: LMA inserted LMA Size: 4.0 Number of attempts: 1 Placement Confirmation: positive ETCO2 and breath sounds checked- equal and bilateral Tube secured with: Tape Dental Injury: Teeth and Oropharynx as per pre-operative assessment

## 2016-10-10 NOTE — Interval H&P Note (Signed)
History and Physical Interval Note:  10/10/2016 12:11 PM  Tiffany Lynch  has presented today for surgery, with the diagnosis of LEFT KNEE MEDIAL MENISCUS TEAR AND LOOSE BODY  The various methods of treatment have been discussed with the patient and family. After consideration of risks, benefits and other options for treatment, the patient has consented to  Procedure(s): KNEE ARTHROSCOPY WITH MEDIAL MENISECTOMY/DRILLING/MICROFRACTURE (Left) as a surgical intervention .  The patient's history has been reviewed, patient examined, no change in status, stable for surgery.  I have reviewed the patient's chart and labs.  Questions were answered to the patient's satisfaction.     Arther Abbott

## 2016-10-10 NOTE — Anesthesia Postprocedure Evaluation (Signed)
Anesthesia Post Note  Patient: Tiffany Lynch  Procedure(s) Performed: Procedure(s) (LRB): KNEE ARTHROSCOPY WITH MEDIAL MENISECTOMY REMOVAL LOSE BODY (Left)  Patient location during evaluation: PACU Anesthesia Type: General Level of consciousness: awake and alert and oriented Pain management: pain level controlled Vital Signs Assessment: post-procedure vital signs reviewed and stable Respiratory status: patient connected to face mask oxygen Cardiovascular status: stable Postop Assessment: no signs of nausea or vomiting Anesthetic complications: no     Last Vitals:  Vitals:   10/10/16 1335 10/10/16 1345  BP: (!) 155/91 (!) 142/96  Pulse: 81 82  Resp: 15 17  Temp:      Last Pain:  Vitals:   10/10/16 1035  TempSrc: Oral  PainSc: 8                  ADAMS, AMY A

## 2016-10-10 NOTE — Transfer of Care (Signed)
Immediate Anesthesia Transfer of Care Note  Patient: Tiffany Lynch  Procedure(s) Performed: Procedure(s): KNEE ARTHROSCOPY WITH MEDIAL MENISECTOMY REMOVAL LOSE BODY (Left)  Patient Location: PACU  Anesthesia Type:General  Level of Consciousness: awake, oriented and patient cooperative  Airway & Oxygen Therapy: Patient Spontanous Breathing and Patient connected to face mask oxygen  Post-op Assessment: Report given to RN and Post -op Vital signs reviewed and stable  Post vital signs: Reviewed and stable  Last Vitals:  Vitals:   10/10/16 1205 10/10/16 1210  BP: (!) 143/98 (!) 132/103  Resp: (!) 32 (!) 33  Temp:      Last Pain:  Vitals:   10/10/16 1035  TempSrc: Oral  PainSc: 8       Patients Stated Pain Goal: 9 ([phone])  Complications: No apparent anesthesia complications

## 2016-10-11 ENCOUNTER — Encounter (HOSPITAL_COMMUNITY): Payer: Self-pay | Admitting: Orthopedic Surgery

## 2016-10-14 ENCOUNTER — Encounter: Payer: Self-pay | Admitting: Orthopedic Surgery

## 2016-10-14 ENCOUNTER — Ambulatory Visit (INDEPENDENT_AMBULATORY_CARE_PROVIDER_SITE_OTHER): Payer: Medicaid Other | Admitting: Orthopedic Surgery

## 2016-10-14 DIAGNOSIS — Z9889 Other specified postprocedural states: Secondary | ICD-10-CM

## 2016-10-14 NOTE — Patient Instructions (Signed)
Continue bending the knee 253 times a day

## 2016-10-14 NOTE — Progress Notes (Signed)
Postop visit #1 status post knee arthroscopy  Chief Complaint  Patient presents with  . Follow-up    POST OP 1, SALK MM, DOS 10/10/16     Operative report  PRE-OPERATIVE DIAGNOSIS:  LEFT KNEE MEDIAL MENISCUS TEAR AND LOOSE BODY   POST-OPERATIVE DIAGNOSIS:  LEFT KNEE MEDIAL MENISCUS TEAR AND LOOSE BODY   PROCEDURE:  Procedure(s): KNEE ARTHROSCOPY WITH MEDIAL MENISECTOMY REMOVAL LOSE BODY (Left)    Surgical findings grade 4 cartilage loss of the medial femoral condyle on contain lesion with a grade 4 lesion of the tibial plateau is extensive torn medial meniscus   Anterior cruciate ligament PCL lateral compartment normal patellofemoral joint mild arthritis   Entire joint extensive synovitis and multiple loose bodies   Problems issues concerned: She does not have a ride to therapy so we will try to get Medicaid to approve home therapy 3 times a week for 3 weeks   Current Meds  Medication Sig  . ALPRAZolam (XANAX) 1 MG tablet Take 1 mg by mouth 4 (four) times daily.  Marland Kitchen buPROPion (WELLBUTRIN XL) 300 MG 24 hr tablet Take 300 mg by mouth every morning.  . busPIRone (BUSPAR) 15 MG tablet Take 15 mg by mouth 3 (three) times daily.  Marland Kitchen BUTRANS 15 MCG/HR PTWK Apply 1 patch topically once a week.  . cyclobenzaprine (FLEXERIL) 10 MG tablet Take 10 mg by mouth 3 (three) times daily.  . DULoxetine (CYMBALTA) 60 MG capsule Take 60 mg by mouth every morning.   . fluticasone (FLONASE) 50 MCG/ACT nasal spray Place 1 spray into both nostrils daily.  Marland Kitchen HYDROcodone-acetaminophen (NORCO/VICODIN) 5-325 MG tablet Take 1 tablet by mouth every 4 (four) hours as needed for moderate pain.  Marland Kitchen risperiDONE (RISPERDAL) 3 MG tablet Take 3 mg by mouth at bedtime.  . VOLTAREN 1 % GEL Apply 1 g topically 4 (four) times daily as needed for pain.     The portal sites are clean dry and intact, the sutures were removed.  The calf was supple soft and the Homans sign was negative.  The patient is regaining knee  flexion  Physical therapy will be started  Status post knee arthroscopy  Follow-up in  3-4 WEEKS

## 2016-10-22 ENCOUNTER — Encounter (HOSPITAL_COMMUNITY): Payer: Self-pay | Admitting: *Deleted

## 2016-10-22 ENCOUNTER — Emergency Department (HOSPITAL_COMMUNITY)
Admission: EM | Admit: 2016-10-22 | Discharge: 2016-10-23 | Disposition: A | Discharge status: Home / Self Care | Payer: Medicaid Other | Attending: Emergency Medicine | Admitting: Emergency Medicine

## 2016-10-22 DIAGNOSIS — F17210 Nicotine dependence, cigarettes, uncomplicated: Secondary | ICD-10-CM | POA: Diagnosis not present

## 2016-10-22 DIAGNOSIS — J449 Chronic obstructive pulmonary disease, unspecified: Secondary | ICD-10-CM | POA: Diagnosis not present

## 2016-10-22 DIAGNOSIS — I10 Essential (primary) hypertension: Secondary | ICD-10-CM | POA: Diagnosis not present

## 2016-10-22 DIAGNOSIS — Z791 Long term (current) use of non-steroidal anti-inflammatories (NSAID): Secondary | ICD-10-CM | POA: Diagnosis not present

## 2016-10-22 DIAGNOSIS — F10120 Alcohol abuse with intoxication, uncomplicated: Secondary | ICD-10-CM | POA: Insufficient documentation

## 2016-10-22 DIAGNOSIS — F13129 Sedative, hypnotic or anxiolytic abuse with intoxication, unspecified: Secondary | ICD-10-CM

## 2016-10-22 DIAGNOSIS — Z79899 Other long term (current) drug therapy: Secondary | ICD-10-CM | POA: Insufficient documentation

## 2016-10-22 DIAGNOSIS — F10920 Alcohol use, unspecified with intoxication, uncomplicated: Secondary | ICD-10-CM

## 2016-10-22 DIAGNOSIS — T424X1A Poisoning by benzodiazepines, accidental (unintentional), initial encounter: Secondary | ICD-10-CM

## 2016-10-22 DIAGNOSIS — F13120 Sedative, hypnotic or anxiolytic abuse with intoxication, uncomplicated: Secondary | ICD-10-CM | POA: Insufficient documentation

## 2016-10-22 LAB — CBC WITH DIFFERENTIAL/PLATELET
Basophils Absolute: 0 10*3/uL (ref 0.0–0.1)
Basophils Relative: 0 %
Eosinophils Absolute: 0.1 10*3/uL (ref 0.0–0.7)
Eosinophils Relative: 1 %
HCT: 47 % — ABNORMAL HIGH (ref 36.0–46.0)
Hemoglobin: 16.3 g/dL — ABNORMAL HIGH (ref 12.0–15.0)
Lymphocytes Relative: 31 %
Lymphs Abs: 3.5 10*3/uL (ref 0.7–4.0)
MCH: 31.8 pg (ref 26.0–34.0)
MCHC: 34.7 g/dL (ref 30.0–36.0)
MCV: 91.6 fL (ref 78.0–100.0)
Monocytes Absolute: 0.4 10*3/uL (ref 0.1–1.0)
Monocytes Relative: 3 %
Neutro Abs: 7.3 10*3/uL (ref 1.7–7.7)
Neutrophils Relative %: 65 %
Platelets: 234 10*3/uL (ref 150–400)
RBC: 5.13 MIL/uL — ABNORMAL HIGH (ref 3.87–5.11)
RDW: 13.8 % (ref 11.5–15.5)
WBC: 11.3 10*3/uL — ABNORMAL HIGH (ref 4.0–10.5)

## 2016-10-22 LAB — COMPREHENSIVE METABOLIC PANEL
ALT: 20 U/L (ref 14–54)
AST: 16 U/L (ref 15–41)
Albumin: 4.3 g/dL (ref 3.5–5.0)
Alkaline Phosphatase: 85 U/L (ref 38–126)
Anion gap: 11 (ref 5–15)
BUN: 14 mg/dL (ref 6–20)
CO2: 28 mmol/L (ref 22–32)
Calcium: 9.1 mg/dL (ref 8.9–10.3)
Chloride: 102 mmol/L (ref 101–111)
Creatinine, Ser: 0.78 mg/dL (ref 0.44–1.00)
GFR calc Af Amer: >60 mL/min (ref >60)
GFR calc non Af Amer: >60 mL/min (ref >60)
Glucose, Bld: 103 mg/dL — ABNORMAL HIGH (ref 65–99)
Potassium: 3 mmol/L — ABNORMAL LOW (ref 3.5–5.1)
Sodium: 141 mmol/L (ref 135–145)
Total Bilirubin: 0.4 mg/dL (ref 0.3–1.2)
Total Protein: 7.2 g/dL (ref 6.5–8.1)

## 2016-10-22 LAB — SALICYLATE LEVEL: Salicylate Lvl: <7 mg/dL (ref 2.8–30.0)

## 2016-10-22 LAB — ACETAMINOPHEN LEVEL: Acetaminophen (Tylenol), Serum: <10 ug/mL — ABNORMAL LOW (ref 10–30)

## 2016-10-22 LAB — ETHANOL: Alcohol, Ethyl (B): 258 mg/dL — ABNORMAL HIGH (ref <5)

## 2016-10-22 LAB — CBG MONITORING, ED: Glucose-Capillary: 104 mg/dL — ABNORMAL HIGH (ref 65–99)

## 2016-10-22 NOTE — ED Provider Notes (Signed)
[facility] DEPT Provider Note   CSN: [id number] Arrival date & time: 10/22/16  2102     History   Chief Complaint Chief Complaint  Patient presents with  . Altered Mental Status    HPI Tiffany Lynch is a 56 y.o. female.  HPI 56 year old female who presents with alcohol intoxication. She has a history of bipolar disorder, COPD on 3 L home oxygen, hypertension, OSA, chronic low back pain. States that she found out that her best friend had passed away earlier this week and since then has been extremely sad and depressed. States that tonight drink a fifth of alcohol and took 2-3 mg of Xanax. States that she also took some of her pain medications. States that this was not in an attempt to hurt herself. Denies any suicidal thoughts or homicidal thoughts. Denies any auditory or visual hallucinations. No falls or trauma. Has otherwise been in her usual state of health. Past Medical History:  Diagnosis Date  . Anxiety   . Bipolar 1 disorder ([place])   . Chronic back pain   . COPD (chronic obstructive pulmonary disease) ([place])   . Depression   . GERD (gastroesophageal reflux disease)   . History of anemia   . Hypertension   . On home oxygen therapy    3LPM PRN  . Sleep apnea     Patient Active Problem List   Diagnosis Date Noted  . Derangement of posterior horn of medial meniscus of left knee   . Loose body of left knee   . Hypotension 08/14/2016  . Respiratory failure with hypoxia ([place]) 08/14/2016  . Hyperglycemia 08/14/2016  . Alcohol abuse 08/14/2016  . Cocaine abuse 08/14/2016  . Chronic pain 08/14/2016  . Oral thrush 08/14/2016  . Hypokalemia 05/31/2016  . Pre-syncope 05/31/2016  . Current smoker 07/20/2015  . Urge and stress incontinence 07/14/2015  . Hyperlipidemia 01/02/2015  . Insomnia 01/02/2015  . OAB (overactive bladder) 01/02/2015  . Depression 01/02/2015  . GERD (gastroesophageal reflux disease) 01/02/2015  . ARF (acute renal failure) ([place]) 12/21/2013  .  COPD (chronic obstructive pulmonary disease) ([place]) 05/22/2012  . Dysfunctional uterine bleeding 04/01/2012  . Orthostatic hypotension 03/31/2012  . Encephalopathy acute 03/30/2012  . Chronic back pain   . Bipolar 1 disorder ([place])   . Anxiety     Past Surgical History:  Procedure Laterality Date  . COLONOSCOPY WITH PROPOFOL N/A 07/18/2014   Procedure: COLONOSCOPY WITH PROPOFOL;  Surgeon: Rogene Houston, MD;  Location: [facility];  Service: Endoscopy;  Laterality: N/A;  in cecum at 0753, withdrawal time 20 min  . DILATION AND CURETTAGE OF UTERUS  age 8  . KNEE ARTHROSCOPY WITH MEDIAL MENISECTOMY Left 10/10/2016   Procedure: KNEE ARTHROSCOPY WITH MEDIAL MENISECTOMY REMOVAL LOSE BODY;  Surgeon: Carole Civil, MD;  Location: [facility];  Service: Orthopedics;  Laterality: Left;  . POLYPECTOMY  05/22/2012   Procedure: POLYPECTOMY;  Surgeon: Jonnie Kind, MD;  Location: [facility];  Service: Gynecology;  Laterality: N/A;  Endometrial Polypectomy  . POLYPECTOMY N/A 07/18/2014   Procedure: POLYPECTOMY;  Surgeon: Rogene Houston, MD;  Location: [facility];  Service: Endoscopy;  Laterality: N/A;  . TUBAL LIGATION      OB History    Gravida Para Term Preterm AB Living   2 2 2     2    SAB TAB Ectopic Multiple Live Births                   Home Medications  Prior to Admission medications   Medication Sig Start Date End Date Taking? Authorizing Provider  ALPRAZolam Duanne Moron) 1 MG tablet Take 1 mg by mouth 4 (four) times daily. 09/24/16  Yes Historical Provider, MD  buPROPion (WELLBUTRIN XL) 300 MG 24 hr tablet Take 300 mg by mouth every morning.   Yes Historical Provider, MD  busPIRone (BUSPAR) 15 MG tablet Take 15 mg by mouth 3 (three) times daily. 07/27/16  Yes Historical Provider, MD  BUTRANS 15 MCG/HR PTWK Apply 1 patch topically once a week. 08/07/16  Yes Historical Provider, MD  cyclobenzaprine (FLEXERIL) 10 MG tablet Take 10 mg by mouth 3 (three) times daily as needed for muscle spasms.  09/10/16   Yes Historical Provider, MD  DULoxetine (CYMBALTA) 60 MG capsule Take 60 mg by mouth every morning.    Yes Historical Provider, MD  fluticasone (FLONASE) 50 MCG/ACT nasal spray Place 1 spray into both nostrils daily. 07/10/16  Yes Historical Provider, MD  HYDROcodone-acetaminophen (NORCO/VICODIN) 5-325 MG tablet Take 1 tablet by mouth every 4 (four) hours as needed for moderate pain. 10/10/16  Yes Carole Civil, MD  ibuprofen (ADVIL,MOTRIN) 800 MG tablet Take 800 mg by mouth 3 (three) times daily. 09/16/16  Yes Historical Provider, MD  LYRICA 100 MG capsule Take 100 mg by mouth 3 (three) times daily. 08/07/16  Yes Historical Provider, MD  omeprazole (PRILOSEC) 20 MG capsule Take 20 mg by mouth daily. 07/29/16  Yes Historical Provider, MD  OXYGEN Inhale 3 L into the lungs at bedtime.   Yes Historical Provider, MD  risperiDONE (RISPERDAL) 3 MG tablet Take 3 mg by mouth at bedtime. 05/23/16  Yes Historical Provider, MD  traMADol (ULTRAM) 50 MG tablet Take 50 mg by mouth every 6 (six) hours as needed for moderate pain or severe pain.  09/16/16  Yes Historical Provider, MD  traZODone (DESYREL) 100 MG tablet Take 300 mg by mouth at bedtime. 07/01/16  Yes Historical Provider, MD  VOLTAREN 1 % GEL Apply 1 g topically 4 (four) times daily as needed for pain. 09/16/16  Yes Historical Provider, MD    Family History Family History  Problem Relation Age of Onset  . Hypertension Mother   . Hypertension Brother   . Anxiety disorder Brother   . Depression Brother     Social History Social History  Substance Use Topics  . Smoking status: Current Every Day Smoker    Packs/day: 1.00    Years: 35.00    Types: Cigarettes  . Smokeless tobacco: Never Used  . Alcohol use Yes     Comment: occasionally     Allergies   Patient has no known allergies.   Review of Systems Review of Systems 10/14 systems reviewed and are negative other than those stated in the HPI   Physical Exam Updated Vital Signs BP  129/83   Pulse 86   Temp 98.1 F (36.7 C) (Oral)   Resp 24   Ht 5\' 3"  (1.6 m)   Wt 216 lb (98 kg)   LMP 05/06/2012   SpO2 91%   BMI 38.26 kg/m   Physical Exam Physical Exam  Nursing note and vitals reviewed. Constitutional: appears intoxicated, non-toxic, and in no acute distress Head: Normocephalic and atraumatic.  Mouth/Throat: Oropharynx is clear and moist.  Neck: Normal range of motion. Neck supple.  Cardiovascular: Normal rate and regular rhythm.   Pulmonary/Chest: Effort normal and breath sounds normal.  Abdominal: Soft. There is no tenderness. There is no rebound and no guarding.  Musculoskeletal: Normal range of motion.  Neurological: Alert, no facial droop, slurring of speech, moves all extremities symmetrically, PERRL, EOMI, sensation to light touch in tact throughout Skin: Skin is warm and dry.  Psychiatric: Cooperative   ED Treatments / Results  Labs (all labs ordered are listed, but only abnormal results are displayed) Labs Reviewed  CBC WITH DIFFERENTIAL/PLATELET - Abnormal; Notable for the following:       Result Value   WBC 11.3 (*)    RBC 5.13 (*)    Hemoglobin 16.3 (*)    HCT 47.0 (*)    All other components within normal limits  COMPREHENSIVE METABOLIC PANEL - Abnormal; Notable for the following:    Potassium 3.0 (*)    Glucose, Bld 103 (*)    All other components within normal limits  ETHANOL - Abnormal; Notable for the following:    Alcohol, Ethyl (B) 258 (*)    All other components within normal limits  ACETAMINOPHEN LEVEL - Abnormal; Notable for the following:    Acetaminophen (Tylenol), Serum <10 (*)    All other components within normal limits  RAPID URINE DRUG SCREEN, HOSP PERFORMED - Abnormal; Notable for the following:    Benzodiazepines POSITIVE (*)    All other components within normal limits  CBG MONITORING, ED - Abnormal; Notable for the following:    Glucose-Capillary 104 (*)    All other components within normal limits    SALICYLATE LEVEL    EKG  EKG Interpretation None       Radiology No results found.  Procedures Procedures (including critical care time)  Medications Ordered in ED Medications - No data to display   Initial Impression / Assessment and Plan / ED Course  I have reviewed the triage vital signs and the nursing notes.  Pertinent labs & imaging results that were available during my care of the patient were reviewed by me and considered in my medical decision making (see chart for details).     Presents with AMS, due to intoxication from alcohol, benzodiazepines, and opiates. Is non-toxic and in no acute distress. Answers questions appropriately and neuro in tact. No airway issues. Denying SI/HI. No evidence of trauma. Will await clinical sobriety and likely discharge.  Final Clinical Impressions(s) / ED Diagnoses   Final diagnoses:  Alcoholic intoxication without complication ([place])  Benzodiazepine intoxication    New Prescriptions Discharge Medication List as of 10/23/2016  5:11 AM       Forde Dandy, MD 10/23/16 1044

## 2016-10-22 NOTE — ED Triage Notes (Signed)
Pt brought in by [facility] for c/o altered loc; pt's best friend died recently and pt is very upset; pt has been drinking alcohol vodka and has taken several xanax;

## 2016-10-23 ENCOUNTER — Telehealth: Payer: Self-pay | Admitting: Orthopedic Surgery

## 2016-10-23 LAB — RAPID URINE DRUG SCREEN, HOSP PERFORMED
Amphetamines: NOT DETECTED
Barbiturates: NOT DETECTED
Benzodiazepines: POSITIVE — AB
Cocaine: NOT DETECTED
Opiates: NOT DETECTED
Tetrahydrocannabinol: NOT DETECTED

## 2016-10-23 NOTE — Discharge Instructions (Signed)
You shouldn't be mixing your alprazolam with alcohol or pain medications !!!!!  You need to discuss your depression and alcoholism with your psychiatrist, Dr Reece Levy. Return to the ED if you feel you may do something to harm yourself.

## 2016-10-23 NOTE — ED Provider Notes (Addendum)
1:50 AM Patient left at change of shift due to drinking alcohol and taking several benzodiazepines. Reportedly there was no suicidal ideation although patient is upset about the recent death of a friend. Patient is sleeping. She is easily awakened however her speech is still a little mumbled and low. Reassess again later.  3:45 AM. Patient is much more alert and able to talk now. She reports she is depressed however she is not suicidal. She states she's never been admitted to psychiatric hospital for depression. She does admit to a history of alcohol abuse and has had several DUIs in the past. She states she did go to detox once with a court order 7 years ago after obtaining several DUIs.. However she states she drinks most days. Patient was offered TTS evaluation however she refused. Patient states her mother will come pick her up this morning.   Patient has had 17 controlled substance prescriptions filled in the past 6 months. She gets monthly #120 alprazolam 1 mg tablets from her psychiatrist, Dr. Reece Levy. She gets monthly number four Butrans15 g per hour patches, last filled January 8, monthly #90 Lyrica 100 mg tablets last filled January 8, and in addition she has had 2 narcotics written by her orthopedist, #30 and #42 hydrocodone 5/325 written on February 1 and February 5. She also gets monthly #220 tramadol monthly, last filled January 8 by her neurologist.    Rolland Porter, MD, Barbette Or, MD 10/23/16 [id number]    Rolland Porter, MD 10/23/16 [phone]

## 2016-10-23 NOTE — Telephone Encounter (Signed)
Darlina Guys from [facility] health care called to let Dr. Aline Brochure know that there had been a delay with the referral, but they can see her on Friday 10/25/16 if that is ok with him.  Her number is: [phone]  Please call and advise

## 2016-10-23 NOTE — Telephone Encounter (Signed)
Advised okay to proceed

## 2016-10-29 ENCOUNTER — Telehealth: Payer: Self-pay | Admitting: Orthopedic Surgery

## 2016-10-29 NOTE — Telephone Encounter (Signed)
Tiffany Lynch from [facility] asks that you give her a call regarding orders for this patient.  [name]'s phone number is [phone]  Thanks

## 2016-10-29 NOTE — Telephone Encounter (Signed)
Returned call, no answer, left vm 

## 2016-10-30 NOTE — Telephone Encounter (Signed)
Therapist given verbal okay to start PT

## 2016-11-04 ENCOUNTER — Ambulatory Visit: Payer: Medicaid Other | Admitting: Orthopedic Surgery

## 2016-11-04 ENCOUNTER — Encounter: Payer: Self-pay | Admitting: Orthopedic Surgery

## 2016-11-05 ENCOUNTER — Ambulatory Visit (INDEPENDENT_AMBULATORY_CARE_PROVIDER_SITE_OTHER): Payer: Medicaid Other | Admitting: Orthopedic Surgery

## 2016-11-05 ENCOUNTER — Encounter: Payer: Self-pay | Admitting: Orthopedic Surgery

## 2016-11-05 DIAGNOSIS — G8929 Other chronic pain: Secondary | ICD-10-CM

## 2016-11-05 DIAGNOSIS — M25562 Pain in left knee: Secondary | ICD-10-CM

## 2016-11-05 DIAGNOSIS — Z9889 Other specified postprocedural states: Secondary | ICD-10-CM

## 2016-11-05 DIAGNOSIS — M1712 Unilateral primary osteoarthritis, left knee: Secondary | ICD-10-CM

## 2016-11-05 MED ORDER — HYDROCODONE-ACETAMINOPHEN 5-325 MG PO TABS: 1.0000 | ORAL_TABLET | ORAL | 0 refills | Status: DC | PRN

## 2016-11-05 NOTE — Patient Instructions (Addendum)
LAST REFILL TODAY  FINISH THERAPY WITH THERAPIST AND THE CONTINUE HOME EXERCISES FOR 4 ADDITIONAL WEEKS   What You Need to Know About Prescription Opioid Pain Medicine        Please be advised. You are on a medication which is classified as an "opiod". The CDC the [facility]  has recently advised all providers to advise patient's that these medications have certain risks which include but are not limited to:    drug intolerance  drug addiction  respiratory depression   respiratory failure  Death  Please keep these medications locked away. If you feel that you are becoming addicted to these medicines or you are having difficulties with these medications please alert your provider.   As your provider I will attempt to wean you off of these medications when you're severe acute pain has been taking care of. However, if we cannot wean you off of this medication you will be sent to a pain management center where they can better manage chronic pain   Opioids are powerful medicines that are used to treat moderate to severe pain. Opioids should be taken with the supervision of a trained health care provider. They should be taken for the shortest period of time as possible. This is because opioids can be addictive and the longer you take opioids, the greater your risk of addiction (opioid use disorder). What do opioids do? Opioids help reduce or eliminate pain. When used for short periods of time, they can help you:  Sleep better.  Do better in physical or occupational therapy.  Feel better in the first few days after an injury.  Recover from surgery. What kind of problems can opioids cause? Opioids can cause side effects, such as:  Constipation.  Nausea.  Vomiting.  Drowsiness.  Confusion.  Opioid use disorder.  Breathing difficulties (respiratory depression). Using opioid pain medicines for longer than 3 days increases your risk of these side  effects. Taking opioid pain medicine for a long period of time can affect your ability to do daily tasks. It also puts you at risk for:  Car accidents.  Heart attack.  Overdose, which can sometimes lead to death. What can increase my risk for developing problems while taking opioids? You may be at an especially high risk for problems while taking opioids if you:  Are over the age of 51.  Are pregnant.  Have kidney or liver disease.  Have certain mental health conditions, such as depression or anxiety.  Have a history of substance use disorder.  Have had an opioid overdose in the past. How do I stop taking opioids if I have been taking them for a long time? If you have been taking opioid medicine for more than a few weeks, you may need to slowly stop taking them (taper). Tapering your use of opioids can decrease your chances of experiencing withdrawal symptoms, such as:  Abdominal pain and cramping.  Nausea.  Sweating.  Sleepiness.  Restlessness.  Uncontrollable shaking (tremors).  Cravings for the medicine. Do not attempt to taper your use of opioids on your own. Talk with your health care provider about how to do this. Your health care provider may prescribe a step-down schedule based on how much medicine you are taking and how long you have been taking it. What are the benefits of stopping the use of opioids? By switching from opioid pain medicine to non-opioid pain management options, you will decrease your risk of accidents and injuries associated with  long-term opioid use. You will also be able to:  Monitor your pain more accurately and know when to seek medical care if it is not improving.  Decrease risk to others around you. Having opioids in the home increases the risk for accidental or intentional use or overdose by others. How can I treat pain without opioids? Pain can be managed with many types of alternative treatments. Ask your health care provider to refer  you to one or more specialists who can help you manage pain through:  Physical or occupational therapy.  Counseling (cognitive-behavioral therapy).  Good nutrition.  Biofeedback.  Massage.  Meditation.  Non-opioid medicine.  Following a gentle exercise program. Where can I get support? If you have been taking opioids for a long time, you may benefit from receiving support for quitting from a local support group or counselor. Ask your health care provider for a referral to these resources in your area. When should I seek medical care? Seek medical care right away if you are taking opioids and you experience any of the following:  Difficulty breathing.  Breathing that is more shallow or slower than normal.  A very slow heartbeat (pulse).  Severe confusion.  Unconsciousness.  Sleepiness.  Difficulty waking from sleep.  Slurred speech.  Nausea and vomiting.  Cold, clammy skin.  Blue lips or fingernails.  Limpness.  Abnormally small pupils. If you think that you or someone else may have taken too much of an opioid medicine, get medical help right away. Do not wait to see if the symptoms go away on their own. Call your local emergency services (911 in the [place] or call the hotline of the [facility] (412)421-0717 in the [place].).  Where can I get more information? To learn more about opioid medicines, visit the Centers for Disease Control and Prevention web site Opioid Basics at https://keller-santana.com/. Summary  Opioid medicines can help you manage moderate-to-severe pain for a short period of time.  Taking opioid pain medicine for a long period of time puts you at risk for unintentional accidents, injury, and even death.  If you think that you or someone else may have taken too much of an opioid, get medical help right away. This information is not intended to replace advice given to you by your health care provider. Make  sure you discuss any questions you have with your health care provider. Document Released: 09/22/2015 Document Revised: 04/19/2016 Document Reviewed: 04/07/2015 Elsevier Interactive Patient Education  2017 [employer].

## 2016-11-05 NOTE — Progress Notes (Signed)
Patient ID: Tiffany Lynch, female   DOB: 07-10-1961, 56 y.o.   MRN: [id number]  Follow up visit-POST OP VISIT   Chief Complaint  Patient presents with  . Follow-up    SALK 10/10/16    The patient says she was doing well and she fell 4 days ago had some trouble weightbearing now back to using her walker weight bearing as tolerated  Exam shows no joint effusion she has some mild laxity medial to lateral in each direction. Some mild AP instability as well but the endpoint is firm  I refilled her medicine told her to use her walker for couple of days and she can follow with [place] as needed she is advised to continue home exercises as Medicaid only allows a certain number PT visits   Encounter Diagnoses  Name Primary?  . S/P left knee arthroscopy Yes  . Chronic pain of left knee   . Primary osteoarthritis of left knee     LMP 05/06/2012   10:13 AM Arther Abbott, MD 11/05/2016

## 2016-11-18 ENCOUNTER — Telehealth: Payer: Self-pay | Admitting: Orthopedic Surgery

## 2016-11-18 NOTE — Telephone Encounter (Signed)
Minna Merritts called to relay that she attempted to see patient twice last week for home visits, and patient was not there. Requesting to extend for visit this week. Please call [phone].

## 2016-11-19 NOTE — Telephone Encounter (Signed)
Verbal ok given.

## 2017-01-13 ENCOUNTER — Ambulatory Visit: Payer: Self-pay | Admitting: Orthopedic Surgery

## 2017-01-14 ENCOUNTER — Ambulatory Visit (INDEPENDENT_AMBULATORY_CARE_PROVIDER_SITE_OTHER): Payer: Medicaid Other | Admitting: Orthopedic Surgery

## 2017-01-14 ENCOUNTER — Encounter: Payer: Self-pay | Admitting: Orthopedic Surgery

## 2017-01-14 VITALS — BP 115/75 | HR 71 | Ht 63.0 in | Wt 200.0 lb

## 2017-01-14 DIAGNOSIS — M48062 Spinal stenosis, lumbar region with neurogenic claudication: Secondary | ICD-10-CM

## 2017-01-14 MED ORDER — GABAPENTIN 100 MG PO CAPS: 100.0000 mg | ORAL_CAPSULE | Freq: Three times a day (TID) | ORAL | 0 refills | Status: DC

## 2017-01-14 MED ORDER — PREDNISONE 10 MG (48) PO TBPK: ORAL_TABLET | ORAL | 0 refills | Status: DC

## 2017-01-14 NOTE — Progress Notes (Addendum)
Patient ID: Tiffany Lynch, female   DOB: [birth year], 56 y.o.   MRN: [id number]  Chief Complaint  Patient presents with  . Extremity Weakness    both legs  . Knee Pain    left knee    HPI This is a 56 year old female who had a left knee arthroscopy did well complains of some popping in her left knee but more importantly she complains of chronic back pain with 3 exacerbation inability to stand for any period of time requiring a walker to stand up weakness and giving way symptoms of both knees and pain radiating down her legs into her feet  Review of Systems  Constitutional: Negative for chills, fever and malaise/fatigue.  Gastrointestinal: Negative.   Genitourinary: Negative for dysuria.  Neurological: Positive for weakness.    Past Medical History:  Diagnosis Date  . Anxiety   . Bipolar 1 disorder ([place])   . Chronic back pain   . COPD (chronic obstructive pulmonary disease) ([place])   . Depression   . GERD (gastroesophageal reflux disease)   . History of anemia   . Hypertension   . On home oxygen therapy    3LPM PRN  . Sleep apnea      LMP 05/06/2012  Gen. appearance is normal grooming and hygiene Orientation to person place and time normal Mood flat and depressed Gait is abnormal supported by a walker No peripheral edema or swelling is noted in the right or left leg Sensory exam shows normal sensation to palpation, pressure and soft touch both legs Skin exam no lacerations ulcerations or erythema right and left leg  Ortho Exam No atrophy or strength deficits are noted in the right or left lower extremity. The hips and knees are both stable. We palpated bilateral back pain increased lumbar lordosis, negative straight leg raises 0+ ankle reflexes and 0-1+ knee reflexes right and left   A/P  Medical decision-making  The patient had MRI in 2012 and had degenerative disc disease and went for epidural injections they were successful  She has obvious spinal stenosis with  exacerbation of symptoms and would be a good candidate for repeat epidural injection   Meds ordered this encounter  Medications  . predniSONE (STERAPRED UNI-PAK 48 TAB) 10 MG (48) TBPK tablet    Sig: Use as directed    Dispense:  48 tablet    Refill:  0  . gabapentin (NEURONTIN) 100 MG capsule    Sig: Take 1 capsule (100 mg total) by mouth 3 (three) times daily.    Dispense:  90 capsule    Refill:  0    Arther Abbott, MD 01/14/2017 2:18 PM

## 2017-02-17 ENCOUNTER — Telehealth: Payer: Self-pay | Admitting: Orthopedic Surgery

## 2017-02-17 NOTE — Telephone Encounter (Signed)
When is her MRI scheduled? She is hurting and wants to get this done.  Please advise  Thanks

## 2017-02-17 NOTE — Telephone Encounter (Signed)
Tiffany Lynch called back and was advised that her authorization has expired and that a new one will need to be obtained.  She will await our phone call with the new authorization.

## 2017-02-17 NOTE — Telephone Encounter (Signed)
THEY COULD NOT REACH HER TO SCHEDULE THE MRI AND I LEFT A MESSAGE WITH THEIR PHONE NUMBER FOR HER TO CALL AND SCHEDULE AND SHE DID NOT. HER AUTHORIZATION HAS EXPIRED NOW SO WE WILL START ALL OVER AND GET ANOTHER INSURANCE AUTHORIZATION. WE WILL CONTACT HER ONCE THIS IS DONE AND HAVE HER CALL AND SCHEDULE.

## 2017-02-21 ENCOUNTER — Ambulatory Visit: Payer: Self-pay | Admitting: Orthopedic Surgery

## 2017-03-01 ENCOUNTER — Ambulatory Visit
Admission: RE | Admit: 2017-03-01 | Discharge: 2017-03-01 | Disposition: A | Discharge status: Home / Self Care | Payer: Medicaid Other | Source: Ambulatory Visit | Attending: Orthopedic Surgery | Admitting: Orthopedic Surgery

## 2017-03-01 DIAGNOSIS — M48062 Spinal stenosis, lumbar region with neurogenic claudication: Secondary | ICD-10-CM

## 2017-03-07 ENCOUNTER — Encounter: Payer: Self-pay | Admitting: Orthopedic Surgery

## 2017-03-07 ENCOUNTER — Ambulatory Visit (INDEPENDENT_AMBULATORY_CARE_PROVIDER_SITE_OTHER): Payer: Medicaid Other | Admitting: Orthopedic Surgery

## 2017-03-07 DIAGNOSIS — Z9889 Other specified postprocedural states: Secondary | ICD-10-CM | POA: Diagnosis not present

## 2017-03-07 DIAGNOSIS — G8929 Other chronic pain: Secondary | ICD-10-CM | POA: Diagnosis not present

## 2017-03-07 DIAGNOSIS — M25562 Pain in left knee: Secondary | ICD-10-CM | POA: Diagnosis not present

## 2017-03-07 DIAGNOSIS — M48062 Spinal stenosis, lumbar region with neurogenic claudication: Secondary | ICD-10-CM | POA: Diagnosis not present

## 2017-03-07 DIAGNOSIS — M1712 Unilateral primary osteoarthritis, left knee: Secondary | ICD-10-CM

## 2017-03-07 MED ORDER — GABAPENTIN 100 MG PO CAPS: 100.0000 mg | ORAL_CAPSULE | Freq: Three times a day (TID) | ORAL | 0 refills | Status: DC

## 2017-03-07 NOTE — Progress Notes (Signed)
Patient ID: Tiffany Lynch, female   DOB: November 19, 1960, 56 y.o.   MRN: [id number]  MRI FOLLOW UP  Chief Complaint  Patient presents with  . Follow-up    MRI results of l-spine.    HPI Tiffany Lynch is a 56 y.o. female.    The patient has had MRI of the Lumbar spine and also had arthroscopy of the left knee back in February still complains of left knee pain with weightbearing  This 56 year old female's had back pain for over 20 years never really had a good workup. Complains of lower back pain and radicular symptoms in her lower extremities. I treated her with Dosepak and gabapentin she still having same symptoms  MRI report: IMPRESSION: 1. Increased size of right foraminal/extraforaminal disc protrusion at L5-S1 with moderate right neural foraminal stenosis. 2. New mild disc bulging from L1-2 to L3-4 without stenosis. 3. Unchanged L4-5 disc degeneration with mild right neural foraminal stenosis.     Electronically Signed   By: Logan Bores M.D.   On: 03/01/2017 14:50      Review of Systems  Neurological: Negative for tingling.    Physical Exam Prior exam findings  No atrophy or strength deficits are noted in the right or left lower extremity. The hips and knees are both stable. We palpated bilateral back pain increased lumbar lordosis, negative straight leg raises 0+ ankle reflexes and 0-1+ knee reflexes right and left   Data  My Independent image interpretation of the MRI showed was disc protrusion with right neural foraminal stenosis   Encounter Diagnoses  Name Primary?  . Spinal stenosis, lumbar region, with neurogenic claudication Yes  . S/P left knee arthroscopy   . Chronic pain of left knee   . Primary osteoarthritis of left knee     The plan is to continue to follow her for her left knee symptoms related to left knee arthritis. Economy hinged brace applied.  Neurosurgery consult regarding her lower back disease  Arther Abbott, MD 03/07/2017 9:16  AM

## 2017-03-24 ENCOUNTER — Telehealth: Payer: Self-pay | Admitting: Orthopedic Surgery

## 2017-03-24 NOTE — Telephone Encounter (Signed)
Patient called, left voice message, regarding having a difficult time with getting around; states falls sometimes once a day. States has problems getting up and getting to bathroom "in time" and is always afraid of falling.  Aware of her referral appointment at [facility] next Monday, 03/31/17; states would like to know if there is anything else she can do until then.  (I tried to call back to patient to discuss - her phone# [phone] has no voice mail set up.

## 2017-03-25 NOTE — Telephone Encounter (Signed)
THERE IS NOTHING WE CAN DO FOR THIS PROBLEM, SHE NEEDS TO MAKE SURE SHE KEEPS HER NEUROSURGERY APPT.

## 2017-03-25 NOTE — Telephone Encounter (Signed)
Called back to patient to relay. Was able to leave voice message.

## 2017-03-26 ENCOUNTER — Telehealth: Payer: Self-pay | Admitting: Orthopedic Surgery

## 2017-03-26 NOTE — Telephone Encounter (Signed)
Patient called asking about the message she had spoken to [place] about on 03/24/17. I relayed the answer to her question as stated in the message Arbie Cookey had sent to the nurse.

## 2017-04-02 ENCOUNTER — Telehealth: Payer: Self-pay | Admitting: Orthopedic Surgery

## 2017-04-02 NOTE — Telephone Encounter (Signed)
WELL I DISAGREE  I DONT NEED TO SEE HER AGAIN   CALL HER REGULAR DOCTOR

## 2017-04-02 NOTE — Telephone Encounter (Signed)
Please advise patient.  

## 2017-04-02 NOTE — Telephone Encounter (Signed)
Patient states she saw the neurosurgeon and he said there was nothing wrong with her back.  She states he told her it was her knees.  She says she is hurting and Gabapentin is not helping.   Please advise  Thanks

## 2017-04-02 NOTE — Telephone Encounter (Signed)
Routing to Dr Harrison 

## 2017-04-02 NOTE — Telephone Encounter (Signed)
Patient has been advised.  She states she will contact her PCP, [place] [facility] regarding possibly getting her a referral to a pain clinic.

## 2017-04-10 ENCOUNTER — Other Ambulatory Visit: Payer: Self-pay | Admitting: Orthopedic Surgery

## 2017-04-10 DIAGNOSIS — M48062 Spinal stenosis, lumbar region with neurogenic claudication: Secondary | ICD-10-CM

## 2017-04-10 NOTE — Telephone Encounter (Signed)
Routing to Dr Harrison for approval 

## 2017-04-28 ENCOUNTER — Telehealth: Payer: Self-pay | Admitting: Orthopedic Surgery

## 2017-04-28 NOTE — Telephone Encounter (Signed)
Patient said that the doctor in [place] said there was nothing wrong with her back but she knows there is.  She then went on stating that her knees hurt too.  She wanted to know what she is supposed to do.  I told her that she was told previously that she needs to see her PCP to get a referral to a pain clinic.  She said she would do this.

## 2017-06-09 ENCOUNTER — Ambulatory Visit: Payer: Self-pay | Admitting: Orthopedic Surgery

## 2017-07-01 ENCOUNTER — Emergency Department (HOSPITAL_COMMUNITY): Payer: Medicaid Other

## 2017-07-01 ENCOUNTER — Observation Stay (HOSPITAL_COMMUNITY)
Admission: EM | Admit: 2017-07-01 | Discharge: 2017-07-02 | Disposition: A | Discharge status: Home / Self Care | Payer: Medicaid Other | Attending: Family Medicine | Admitting: Family Medicine

## 2017-07-01 ENCOUNTER — Encounter (HOSPITAL_COMMUNITY): Payer: Self-pay | Admitting: Emergency Medicine

## 2017-07-01 DIAGNOSIS — Y93E8 Activity, other personal hygiene: Secondary | ICD-10-CM | POA: Diagnosis not present

## 2017-07-01 DIAGNOSIS — G8929 Other chronic pain: Secondary | ICD-10-CM | POA: Diagnosis present

## 2017-07-01 DIAGNOSIS — Y92009 Unspecified place in unspecified non-institutional (private) residence as the place of occurrence of the external cause: Secondary | ICD-10-CM

## 2017-07-01 DIAGNOSIS — F1010 Alcohol abuse, uncomplicated: Secondary | ICD-10-CM | POA: Diagnosis present

## 2017-07-01 DIAGNOSIS — I10 Essential (primary) hypertension: Secondary | ICD-10-CM | POA: Insufficient documentation

## 2017-07-01 DIAGNOSIS — F17200 Nicotine dependence, unspecified, uncomplicated: Secondary | ICD-10-CM | POA: Diagnosis present

## 2017-07-01 DIAGNOSIS — F319 Bipolar disorder, unspecified: Secondary | ICD-10-CM | POA: Diagnosis not present

## 2017-07-01 DIAGNOSIS — S2239XA Fracture of one rib, unspecified side, initial encounter for closed fracture: Secondary | ICD-10-CM | POA: Diagnosis present

## 2017-07-01 DIAGNOSIS — J9611 Chronic respiratory failure with hypoxia: Secondary | ICD-10-CM | POA: Diagnosis not present

## 2017-07-01 DIAGNOSIS — W182XXA Fall in (into) shower or empty bathtub, initial encounter: Secondary | ICD-10-CM | POA: Diagnosis not present

## 2017-07-01 DIAGNOSIS — S299XXA Unspecified injury of thorax, initial encounter: Secondary | ICD-10-CM | POA: Diagnosis present

## 2017-07-01 DIAGNOSIS — G894 Chronic pain syndrome: Secondary | ICD-10-CM | POA: Diagnosis not present

## 2017-07-01 DIAGNOSIS — S2241XA Multiple fractures of ribs, right side, initial encounter for closed fracture: Principal | ICD-10-CM | POA: Insufficient documentation

## 2017-07-01 DIAGNOSIS — Y999 Unspecified external cause status: Secondary | ICD-10-CM | POA: Diagnosis not present

## 2017-07-01 DIAGNOSIS — W19XXXA Unspecified fall, initial encounter: Secondary | ICD-10-CM

## 2017-07-01 DIAGNOSIS — F17210 Nicotine dependence, cigarettes, uncomplicated: Secondary | ICD-10-CM | POA: Diagnosis not present

## 2017-07-01 DIAGNOSIS — R0902 Hypoxemia: Secondary | ICD-10-CM | POA: Diagnosis not present

## 2017-07-01 DIAGNOSIS — J9691 Respiratory failure, unspecified with hypoxia: Secondary | ICD-10-CM | POA: Diagnosis present

## 2017-07-01 DIAGNOSIS — J449 Chronic obstructive pulmonary disease, unspecified: Secondary | ICD-10-CM | POA: Insufficient documentation

## 2017-07-01 DIAGNOSIS — J42 Unspecified chronic bronchitis: Secondary | ICD-10-CM | POA: Diagnosis not present

## 2017-07-01 DIAGNOSIS — Z79899 Other long term (current) drug therapy: Secondary | ICD-10-CM | POA: Diagnosis not present

## 2017-07-01 DIAGNOSIS — Y92002 Bathroom of unspecified non-institutional (private) residence single-family (private) house as the place of occurrence of the external cause: Secondary | ICD-10-CM | POA: Diagnosis not present

## 2017-07-01 DIAGNOSIS — S2249XA Multiple fractures of ribs, unspecified side, initial encounter for closed fracture: Secondary | ICD-10-CM | POA: Diagnosis present

## 2017-07-01 HISTORY — DX: Other chronic pain: G89.29

## 2017-07-01 HISTORY — DX: Radiculopathy, lumbar region: M54.16

## 2017-07-01 HISTORY — DX: Pain in unspecified knee: M25.569

## 2017-07-01 LAB — BASIC METABOLIC PANEL
Anion gap: 11 (ref 5–15)
BUN: 11 mg/dL (ref 6–20)
CO2: 32 mmol/L (ref 22–32)
Calcium: 8.9 mg/dL (ref 8.9–10.3)
Chloride: 99 mmol/L — ABNORMAL LOW (ref 101–111)
Creatinine, Ser: 0.85 mg/dL (ref 0.44–1.00)
GFR calc Af Amer: >60 mL/min (ref >60)
GFR calc non Af Amer: >60 mL/min (ref >60)
Glucose, Bld: 112 mg/dL — ABNORMAL HIGH (ref 65–99)
Potassium: 3.8 mmol/L (ref 3.5–5.1)
Sodium: 142 mmol/L (ref 135–145)

## 2017-07-01 LAB — URINALYSIS, ROUTINE W REFLEX MICROSCOPIC
Bilirubin Urine: NEGATIVE
Glucose, UA: NEGATIVE mg/dL
Hgb urine dipstick: NEGATIVE
Ketones, ur: NEGATIVE mg/dL
Nitrite: NEGATIVE
Protein, ur: NEGATIVE mg/dL
Specific Gravity, Urine: 1.017 (ref 1.005–1.030)
pH: 6 (ref 5.0–8.0)

## 2017-07-01 LAB — RAPID URINE DRUG SCREEN, HOSP PERFORMED
Amphetamines: NOT DETECTED
Barbiturates: NOT DETECTED
Benzodiazepines: POSITIVE — AB
Cocaine: NOT DETECTED
Opiates: NOT DETECTED
Tetrahydrocannabinol: NOT DETECTED

## 2017-07-01 LAB — CBC WITH DIFFERENTIAL/PLATELET
Basophils Absolute: 0 10*3/uL (ref 0.0–0.1)
Basophils Relative: 0 %
Eosinophils Absolute: 0.1 10*3/uL (ref 0.0–0.7)
Eosinophils Relative: 1 %
HCT: 45 % (ref 36.0–46.0)
Hemoglobin: 15.4 g/dL — ABNORMAL HIGH (ref 12.0–15.0)
Lymphocytes Relative: 28 %
Lymphs Abs: 3.3 10*3/uL (ref 0.7–4.0)
MCH: 32.4 pg (ref 26.0–34.0)
MCHC: 34.2 g/dL (ref 30.0–36.0)
MCV: 94.5 fL (ref 78.0–100.0)
Monocytes Absolute: 0.8 10*3/uL (ref 0.1–1.0)
Monocytes Relative: 7 %
Neutro Abs: 7.6 10*3/uL (ref 1.7–7.7)
Neutrophils Relative %: 64 %
Platelets: 297 10*3/uL (ref 150–400)
RBC: 4.76 MIL/uL (ref 3.87–5.11)
RDW: 14.7 % (ref 11.5–15.5)
WBC: 11.8 10*3/uL — ABNORMAL HIGH (ref 4.0–10.5)

## 2017-07-01 LAB — ETHANOL: Alcohol, Ethyl (B): <10 mg/dL (ref <10)

## 2017-07-01 MED ORDER — METOPROLOL TARTRATE 25 MG PO TABS
25.0000 mg | ORAL_TABLET | Freq: Two times a day (BID) | ORAL | Status: DC
  Administered 2017-07-01 – 2017-07-02 (×2): 25 mg via ORAL
  Filled 2017-07-01 (×2): qty 1

## 2017-07-01 MED ORDER — ALBUTEROL SULFATE (2.5 MG/3ML) 0.083% IN NEBU: 2.5000 mg | INHALATION_SOLUTION | RESPIRATORY_TRACT | Status: DC | PRN

## 2017-07-01 MED ORDER — ENOXAPARIN SODIUM 40 MG/0.4ML ~~LOC~~ SOLN: 40.0000 mg | SUBCUTANEOUS | Status: DC

## 2017-07-01 MED ORDER — ONDANSETRON HCL 4 MG/2ML IJ SOLN: 4.0000 mg | Freq: Four times a day (QID) | INTRAMUSCULAR | Status: DC | PRN

## 2017-07-01 MED ORDER — BUPROPION HCL ER (XL) 300 MG PO TB24
300.0000 mg | ORAL_TABLET | Freq: Every morning | ORAL | Status: DC
  Administered 2017-07-02: 300 mg via ORAL
  Filled 2017-07-01: qty 1

## 2017-07-01 MED ORDER — ACETAMINOPHEN 325 MG PO TABS: 650.0000 mg | ORAL_TABLET | Freq: Four times a day (QID) | ORAL | Status: DC | PRN

## 2017-07-01 MED ORDER — BUSPIRONE HCL 5 MG PO TABS
15.0000 mg | ORAL_TABLET | Freq: Three times a day (TID) | ORAL | Status: DC
  Administered 2017-07-01 – 2017-07-02 (×2): 15 mg via ORAL
  Filled 2017-07-01 (×2): qty 3

## 2017-07-01 MED ORDER — TRAZODONE HCL 50 MG PO TABS
300.0000 mg | ORAL_TABLET | Freq: Every day | ORAL | Status: DC
  Administered 2017-07-01: 300 mg via ORAL
  Filled 2017-07-01: qty 6

## 2017-07-01 MED ORDER — IPRATROPIUM-ALBUTEROL 0.5-2.5 (3) MG/3ML IN SOLN
3.0000 mL | Freq: Once | RESPIRATORY_TRACT | Status: AC
  Administered 2017-07-01: 3 mL via RESPIRATORY_TRACT
  Filled 2017-07-01: qty 3

## 2017-07-01 MED ORDER — DULOXETINE HCL 60 MG PO CPEP
60.0000 mg | ORAL_CAPSULE | Freq: Every day | ORAL | Status: DC
  Administered 2017-07-02: 60 mg via ORAL
  Filled 2017-07-01: qty 1

## 2017-07-01 MED ORDER — AMLODIPINE BESYLATE 5 MG PO TABS
5.0000 mg | ORAL_TABLET | Freq: Every day | ORAL | Status: DC
  Administered 2017-07-02: 5 mg via ORAL
  Filled 2017-07-01: qty 1

## 2017-07-01 MED ORDER — ACETAMINOPHEN 650 MG RE SUPP: 650.0000 mg | Freq: Four times a day (QID) | RECTAL | Status: DC | PRN

## 2017-07-01 MED ORDER — RISPERIDONE 1 MG PO TABS
3.0000 mg | ORAL_TABLET | Freq: Every day | ORAL | Status: DC
  Administered 2017-07-01: 3 mg via ORAL
  Filled 2017-07-01: qty 3

## 2017-07-01 MED ORDER — IPRATROPIUM-ALBUTEROL 0.5-2.5 (3) MG/3ML IN SOLN
3.0000 mL | Freq: Four times a day (QID) | RESPIRATORY_TRACT | Status: DC
  Administered 2017-07-01 – 2017-07-02 (×3): 3 mL via RESPIRATORY_TRACT
  Filled 2017-07-01 (×2): qty 3

## 2017-07-01 MED ORDER — TRAMADOL HCL 50 MG PO TABS
50.0000 mg | ORAL_TABLET | Freq: Four times a day (QID) | ORAL | Status: DC | PRN
  Administered 2017-07-01 – 2017-07-02 (×3): 50 mg via ORAL
  Filled 2017-07-01 (×3): qty 1

## 2017-07-01 MED ORDER — IPRATROPIUM-ALBUTEROL 0.5-2.5 (3) MG/3ML IN SOLN: 3.0000 mL | Freq: Three times a day (TID) | RESPIRATORY_TRACT | Status: DC

## 2017-07-01 MED ORDER — PANTOPRAZOLE SODIUM 40 MG PO TBEC
40.0000 mg | DELAYED_RELEASE_TABLET | Freq: Every day | ORAL | Status: DC
  Administered 2017-07-02: 40 mg via ORAL
  Filled 2017-07-01: qty 1

## 2017-07-01 MED ORDER — LIDOCAINE 5 % EX PTCH
1.0000 | MEDICATED_PATCH | CUTANEOUS | Status: DC
  Administered 2017-07-01: 1 via TRANSDERMAL
  Filled 2017-07-01: qty 1

## 2017-07-01 MED ORDER — MORPHINE SULFATE (PF) 2 MG/ML IV SOLN
2.0000 mg | INTRAVENOUS | Status: DC | PRN
  Administered 2017-07-01: 2 mg via INTRAVENOUS
  Filled 2017-07-01: qty 1

## 2017-07-01 MED ORDER — ALBUTEROL SULFATE (2.5 MG/3ML) 0.083% IN NEBU
2.5000 mg | INHALATION_SOLUTION | Freq: Once | RESPIRATORY_TRACT | Status: AC
  Administered 2017-07-01: 2.5 mg via RESPIRATORY_TRACT
  Filled 2017-07-01: qty 3

## 2017-07-01 MED ORDER — ONDANSETRON HCL 4 MG PO TABS: 4.0000 mg | ORAL_TABLET | Freq: Four times a day (QID) | ORAL | Status: DC | PRN

## 2017-07-01 MED ORDER — ALPRAZOLAM 1 MG PO TABS
1.0000 mg | ORAL_TABLET | Freq: Four times a day (QID) | ORAL | Status: DC
  Administered 2017-07-01 – 2017-07-02 (×2): 1 mg via ORAL
  Filled 2017-07-01 (×2): qty 1

## 2017-07-01 MED ORDER — CYCLOBENZAPRINE HCL 10 MG PO TABS
10.0000 mg | ORAL_TABLET | Freq: Three times a day (TID) | ORAL | Status: DC | PRN
  Administered 2017-07-02 (×2): 10 mg via ORAL
  Filled 2017-07-01 (×2): qty 1

## 2017-07-01 MED ORDER — NICOTINE 14 MG/24HR TD PT24
14.0000 mg | MEDICATED_PATCH | Freq: Every day | TRANSDERMAL | Status: DC
  Administered 2017-07-02: 14 mg via TRANSDERMAL
  Filled 2017-07-01: qty 1

## 2017-07-01 MED ORDER — DOCUSATE SODIUM 100 MG PO CAPS
100.0000 mg | ORAL_CAPSULE | Freq: Two times a day (BID) | ORAL | Status: DC
  Administered 2017-07-01 – 2017-07-02 (×2): 100 mg via ORAL
  Filled 2017-07-01 (×2): qty 1

## 2017-07-01 MED ORDER — LORATADINE 10 MG PO TABS
10.0000 mg | ORAL_TABLET | Freq: Every day | ORAL | Status: DC
  Administered 2017-07-02: 10 mg via ORAL
  Filled 2017-07-01: qty 1

## 2017-07-01 NOTE — ED Provider Notes (Signed)
[facility] EMERGENCY DEPARTMENT Provider Note   CSN: [id number] Arrival date & time: 07/01/17  1050     History   Chief Complaint Chief Complaint  Patient presents with  . Fall  . Knee Pain  . Rib Injury    HPI Tiffany Lynch is a 56 y.o. female.  HPI Pt was seen at 1145. Per pt, c/o sudden onset and resolution of one episode of slip and fall that occurred PTA. Pt states she was getting out of the tub when she slipped and fell backwards into the tub. Pt c/o acute flair of her chronic LBP, bilateral knees pain, and right ribs "pain." Describes her pain as per her usual chronic pain pattern. Pt states she took a xanax after she fell. Denies LOC, no head injury, no neck pain, no CP/palpitations, no SOB/cough, no abd pain, no N/V/D, no focal motor weakness.    Past Medical History:  Diagnosis Date  . Anxiety   . Bipolar 1 disorder ([place])   . Chronic back pain   . Chronic knee pain   . COPD (chronic obstructive pulmonary disease) ([place])   . Depression   . GERD (gastroesophageal reflux disease)   . History of anemia   . Hypertension   . Lumbar radiculopathy   . On home oxygen therapy "6 years ago"   3LPM PRN: "had this 6 years ago," pt no longer has O2 at home (07/01/2017)  . Sleep apnea     Patient Active Problem List   Diagnosis Date Noted  . Derangement of posterior horn of medial meniscus of left knee   . Loose body of left knee   . Hypotension 08/14/2016  . Respiratory failure with hypoxia ([place]) 08/14/2016  . Hyperglycemia 08/14/2016  . Alcohol abuse 08/14/2016  . Cocaine abuse ([place]) 08/14/2016  . Chronic pain 08/14/2016  . Oral thrush 08/14/2016  . Hypokalemia 05/31/2016  . Pre-syncope 05/31/2016  . Current smoker 07/20/2015  . Urge and stress incontinence 07/14/2015  . Hyperlipidemia 01/02/2015  . Insomnia 01/02/2015  . OAB (overactive bladder) 01/02/2015  . Depression 01/02/2015  . GERD (gastroesophageal reflux disease) 01/02/2015  . ARF (acute renal  failure) ([place]) 12/21/2013  . COPD (chronic obstructive pulmonary disease) ([place]) 05/22/2012  . Dysfunctional uterine bleeding 04/01/2012  . Orthostatic hypotension 03/31/2012  . Encephalopathy acute 03/30/2012  . Chronic back pain   . Bipolar 1 disorder ([place])   . Anxiety     Past Surgical History:  Procedure Laterality Date  . COLONOSCOPY WITH PROPOFOL N/A 07/18/2014   Procedure: COLONOSCOPY WITH PROPOFOL;  Surgeon: Rogene Houston, MD;  Location: [facility];  Service: Endoscopy;  Laterality: N/A;  in cecum at 0753, withdrawal time 20 min  . DILATION AND CURETTAGE OF UTERUS  age 75  . KNEE ARTHROSCOPY WITH MEDIAL MENISECTOMY Left 10/10/2016   Procedure: KNEE ARTHROSCOPY WITH MEDIAL MENISECTOMY REMOVAL LOSE BODY;  Surgeon: Carole Civil, MD;  Location: [facility];  Service: Orthopedics;  Laterality: Left;  . POLYPECTOMY  05/22/2012   Procedure: POLYPECTOMY;  Surgeon: Jonnie Kind, MD;  Location: [facility];  Service: Gynecology;  Laterality: N/A;  Endometrial Polypectomy  . POLYPECTOMY N/A 07/18/2014   Procedure: POLYPECTOMY;  Surgeon: Rogene Houston, MD;  Location: [facility];  Service: Endoscopy;  Laterality: N/A;  . TUBAL LIGATION      OB History    Gravida Para Term Preterm AB Living   2 2 2     2    SAB TAB Ectopic Multiple Live  Births                   Home Medications    Prior to Admission medications   Medication Sig Start Date End Date Taking? Authorizing Provider  ALPRAZolam Duanne Moron) 1 MG tablet Take 1 mg by mouth 4 (four) times daily. 09/24/16  Yes [provider]  amLODipine (NORVASC) 5 MG tablet Take 5 mg by mouth daily. 06/16/17  Yes [provider]  buPROPion (WELLBUTRIN XL) 300 MG 24 hr tablet Take 300 mg by mouth every morning.   Yes [provider]  busPIRone (BUSPAR) 15 MG tablet Take 15 mg by mouth 3 (three) times daily. 07/27/16  Yes [provider]  cyclobenzaprine (FLEXERIL) 10 MG tablet Take 10 mg by mouth 3 (three) times daily as  needed for muscle spasms.  09/10/16  Yes [provider]  DULoxetine (CYMBALTA) 60 MG capsule Take 60 mg by mouth every morning.    Yes [provider]  ibuprofen (ADVIL,MOTRIN) 800 MG tablet Take 800 mg by mouth 3 (three) times daily. 09/16/16  Yes [provider]  loratadine (CLARITIN) 10 MG tablet Take 10 mg by mouth daily. 04/10/17  Yes [provider]  metoprolol tartrate (LOPRESSOR) 25 MG tablet Take 25 mg by mouth 2 (two) times daily. 06/16/17  Yes [provider]  omeprazole (PRILOSEC) 20 MG capsule Take 20 mg by mouth daily. 07/29/16  Yes [provider]  risperiDONE (RISPERDAL) 3 MG tablet Take 3 mg by mouth at bedtime. 05/23/16  Yes [provider]  traZODone (DESYREL) 100 MG tablet Take 300 mg by mouth at bedtime. 07/01/16  Yes [provider]  VOLTAREN 1 % GEL Apply 1 g topically 4 (four) times daily as needed for pain. 09/16/16  Yes [provider]    Family History Family History  Problem Relation Age of Onset  . Hypertension Mother   . Hypertension Brother   . Anxiety disorder Brother   . Depression Brother     Social History Social History  Substance Use Topics  . Smoking status: Current Every Day Smoker    Packs/day: 1.00    Years: 35.00    Types: Cigarettes  . Smokeless tobacco: Never Used  . Alcohol use Yes     Comment: occasionally     Allergies   Patient has no known allergies.   Review of Systems Review of Systems ROS: Statement: All systems negative except as marked or noted in the HPI; Constitutional: Negative for fever and chills. ; ; Eyes: Negative for eye pain, redness and discharge. ; ; ENMT: Negative for ear pain, hoarseness, nasal congestion, sinus pressure and sore throat. ; ; Cardiovascular: Negative for chest pain, palpitations, diaphoresis, dyspnea and peripheral edema. ; ; Respiratory: Negative for cough, wheezing and stridor. ; ; Gastrointestinal: Negative for nausea,  vomiting, diarrhea, abdominal pain, blood in stool, hematemesis, jaundice and rectal bleeding. . ; ; Genitourinary: Negative for dysuria, flank pain and hematuria. ; ; Musculoskeletal: +LBP, bilateral knees pain, right ribs pain. Negative for neck pain. Negative for swelling and deformity.; ; Skin: Negative for pruritus, rash, abrasions, blisters, bruising and skin lesion.; ; Neuro: Negative for headache, lightheadedness and neck stiffness. Negative for weakness, altered level of consciousness, altered mental status, extremity weakness, paresthesias, involuntary movement, seizure and syncope.      Physical Exam Updated Vital Signs BP (!) 143/107   Pulse 73   Temp 98.2 F (36.8 C) (Oral)   Resp 20   Ht  5\' 3"  (1.6 m)   Wt 93 kg (205 lb)   LMP 05/06/2012   SpO2 94%   BMI 36.31 kg/m    BP (!) 144/90 Comment: Pulse 73, resp 16, O2 with 2 L via [place] 95%  Pulse 72   Temp 98.2 F (36.8 C) (Oral)   Resp 20   Ht 5\' 3"  (1.6 m)   Wt 93 kg (205 lb)   LMP 05/06/2012   SpO2 95%   BMI 36.31 kg/m    Physical Exam 1150: Physical examination: Vital signs and O2 SAT: Reviewed; Constitutional: Well developed, Well nourished, Well hydrated, In no acute distress; Head and Face: Normocephalic, Atraumatic; Eyes: EOMI, PERRL, No scleral icterus; ENMT: Mouth and pharynx normal, Left TM normal, Right TM normal, Mucous membranes moist; Neck: Supple, Trachea midline; Spine:  No midline CS, TS, LS tenderness. +TTP right lumbar paraspinal muscles..; Cardiovascular: Regular rate and rhythm, No gallop; Respiratory: Breath sounds clear & equal bilaterally, No wheezes, Normal respiratory effort/excursion; Chest: +right lateral chest wall tender to palp.  No deformity, Movement normal, No crepitus, No abrasions or ecchymosis.; Abdomen: Soft, Nontender, Nondistended, Normal bowel sounds, No abrasions or ecchymosis.; Genitourinary: No CVA tenderness;; Extremities: No deformity, Full range of motion major/large joints of  bilat UE's and LE's without pain or tenderness to palp. Neurovascularly intact, Pulses normal, +FROM bilat knees, including able to lift extended bilateral LE's off stretcher, and extend bilat lower legs against resistance.  No ligamentous laxity.  No patellar or quad tendon step-offs.  NMS intact bilat feet, strong pedal pp. +plantarflexion of right and left foot w/calf squeeze.  No palpable gap right and left Achilles's tendon.  No proximal fibular head tenderness bilat.  No edema, erythema, warmth, ecchymosis or deformity. Generalized TTP bilat knees without specific area of point tenderness.  No edema, Pelvis stable; Neuro: AA&Ox3, GCS 15.  Major CN grossly intact. Speech slurred. Grips equal. Strength 5/5 equal bilat UE's and LE's. No gross focal motor or sensory deficits in extremities.; Skin: Color normal, Warm, Dry   ED Treatments / Results  Labs (all labs ordered are listed, but only abnormal results are displayed)   EKG  EKG Interpretation None       Radiology   Procedures Procedures (including critical care time)  Medications Ordered in ED Medications - No data to display   Initial Impression / Assessment and Plan / ED Course  I have reviewed the triage vital signs and the nursing notes.  Pertinent labs & imaging results that were available during my care of the patient were reviewed by me and considered in my medical decision making (see chart for details).  MDM Reviewed: previous chart, nursing note and vitals Interpretation: x-ray, CT scan and labs   Results for orders placed or performed during the hospital encounter of 08/67/61  Basic metabolic panel  Result Value Ref Range   Sodium 142 135 - 145 mmol/L   Potassium 3.8 3.5 - 5.1 mmol/L   Chloride 99 (L) 101 - 111 mmol/L   CO2 32 22 - 32 mmol/L   Glucose, Bld 112 (H) 65 - 99 mg/dL   BUN 11 6 - 20 mg/dL   Creatinine, Ser 0.85 0.44 - 1.00 mg/dL   Calcium 8.9 8.9 - 10.3 mg/dL   GFR calc non Af Amer >60 >60  mL/min   GFR calc Af Amer >60 >60 mL/min   Anion gap 11 5 - 15  Ethanol  Result Value Ref Range   Alcohol, Ethyl (B) <10 <  10 mg/dL  CBC with Differential  Result Value Ref Range   WBC 11.8 (H) 4.0 - 10.5 K/uL   RBC 4.76 3.87 - 5.11 MIL/uL   Hemoglobin 15.4 (H) 12.0 - 15.0 g/dL   HCT 45.0 36.0 - 46.0 %   MCV 94.5 78.0 - 100.0 fL   MCH 32.4 26.0 - 34.0 pg   MCHC 34.2 30.0 - 36.0 g/dL   RDW 14.7 11.5 - 15.5 %   Platelets 297 150 - 400 K/uL   Neutrophils Relative % 64 %   Neutro Abs 7.6 1.7 - 7.7 K/uL   Lymphocytes Relative 28 %   Lymphs Abs 3.3 0.7 - 4.0 K/uL   Monocytes Relative 7 %   Monocytes Absolute 0.8 0.1 - 1.0 K/uL   Eosinophils Relative 1 %   Eosinophils Absolute 0.1 0.0 - 0.7 K/uL   Basophils Relative 0 %   Basophils Absolute 0.0 0.0 - 0.1 K/uL  Urine rapid drug screen (hosp performed)  Result Value Ref Range   Opiates NONE DETECTED NONE DETECTED   Cocaine NONE DETECTED NONE DETECTED   Benzodiazepines POSITIVE (A) NONE DETECTED   Amphetamines NONE DETECTED NONE DETECTED   Tetrahydrocannabinol NONE DETECTED NONE DETECTED   Barbiturates NONE DETECTED NONE DETECTED  Urinalysis, Routine w reflex microscopic  Result Value Ref Range   Color, Urine YELLOW YELLOW   APPearance HAZY (A) CLEAR   Specific Gravity, Urine 1.017 1.005 - 1.030   pH 6.0 5.0 - 8.0   Glucose, UA NEGATIVE NEGATIVE mg/dL   Hgb urine dipstick NEGATIVE NEGATIVE   Bilirubin Urine NEGATIVE NEGATIVE   Ketones, ur NEGATIVE NEGATIVE mg/dL   Protein, ur NEGATIVE NEGATIVE mg/dL   Nitrite NEGATIVE NEGATIVE   Leukocytes, UA SMALL (A) NEGATIVE   RBC / HPF 0-5 0 - 5 RBC/hpf   WBC, UA 6-30 0 - 5 WBC/hpf   Bacteria, UA RARE (A) NONE SEEN   Squamous Epithelial / LPF 6-30 (A) NONE SEEN     Dg Ribs Unilateral W/chest Right Result Date: 07/01/2017 CLINICAL DATA:  Initial evaluation for acute trauma, fall. EXAM: RIGHT RIBS AND CHEST - 3+ VIEW COMPARISON:  Prior radiograph from 01/06/2017. FINDINGS: Moderate  cardiomegaly, stable from previous. Mediastinal silhouette normal. Lungs mildly hypoinflated. Diffuse vascular congestion without overt pulmonary edema. No pleural effusion. No focal infiltrates. No pneumothorax. Metallic BB marker overlies the lateral lower right ribs at site of pain. Remotely healed fracture of the right posterior fourth rib noted. There are acute minimally displaced fractures of the right posterolateral seventh and eighth ribs. No other acute displaced rib fracture identified. No other acute osseous abnormality. IMPRESSION: 1. Acute minimally displaced fractures of the right posterolateral seventh and eighth ribs. 2. Cardiomegaly with mild diffuse pulmonary vascular congestion without overt pulmonary edema. Electronically Signed   By: Jeannine Boga M.D.   On: 07/01/2017 13:19   Dg Lumbar Spine Complete Result Date: 07/01/2017 CLINICAL DATA:  Initial evaluation for acute trauma, fall. EXAM: LUMBAR SPINE - COMPLETE 4+ VIEW COMPARISON:  Prior MRI from 03/01/2017. FINDINGS: Five non rib-bearing lumbar type vertebral bodies. Vertebral bodies normally aligned with preservation of the normal lumbar lordosis. No listhesis or subluxation. Vertebral body heights maintained. Sacrum intact. Mild multilevel degenerative spondylolysis at T12-L1 through L5-S1, stable from prior. Aortic atherosclerosis. IMPRESSION: 1. No acute abnormality within the lumbar spine. 2. Stable mild multilevel degenerative spondylolysis. 3. Aortic atherosclerosis. Electronically Signed   By: Jeannine Boga M.D.   On: 07/01/2017 13:20   Ct Head Wo Contrast  Result Date: 07/01/2017 CLINICAL DATA:  Fall, with stepping out of a high bathtub, fell backwards into tub, head trauma EXAM: CT HEAD WITHOUT CONTRAST CT CERVICAL SPINE WITHOUT CONTRAST TECHNIQUE: Multidetector CT imaging of the head and cervical spine was performed following the standard protocol without intravenous contrast. Multiplanar CT image  reconstructions of the cervical spine were also generated. COMPARISON:  Cervical spine CT 12/21/2013, CT head 03/30/2012 FINDINGS: CT HEAD FINDINGS Brain: Minimal atrophy. Normal ventricular morphology. No midline shift or mass effect. White matter hypoattenuation especially in RIGHT frontal lobe. Otherwise normal appearance of brain parenchyma. No intracranial hemorrhage, mass lesion, evidence of acute infarction, or extra-axial fluid collection. Vascular: Unremarkable Skull: Intact Sinuses/Orbits: Clear Other: N/A CT CERVICAL SPINE FINDINGS Alignment: Normal, unchanged Skull base and vertebrae: Osseous demineralization. Visualized skullbase intact. Vertebral body heights maintained. No fracture or bone destruction. Scattered facet degenerative changes. Soft tissues and spinal canal: Prevertebral soft tissues normal thickness. Disc levels: Disc space narrowing with endplate spur formation at C4-C5 through C7-T1. Uncovertebral spurs encroach upon the C6-C7 neural foramina bilaterally greater on LEFT. Upper chest: Lung apices clear. Other: N/A IMPRESSION: Atrophy with small vessel chronic ischemic changes of deep cerebral white matter particularly in RIGHT lower lobe. No acute intracranial abnormalities. Degenerative disc and facet disease changes of the cervical spine. No acute cervical spine abnormalities. Electronically Signed   By: Lavonia Dana M.D.   On: 07/01/2017 13:28   Ct Cervical Spine Wo Contrast Result Date: 07/01/2017 CLINICAL DATA:  Fall, with stepping out of a high bathtub, fell backwards into tub, head trauma EXAM: CT HEAD WITHOUT CONTRAST CT CERVICAL SPINE WITHOUT CONTRAST TECHNIQUE: Multidetector CT imaging of the head and cervical spine was performed following the standard protocol without intravenous contrast. Multiplanar CT image reconstructions of the cervical spine were also generated. COMPARISON:  Cervical spine CT 12/21/2013, CT head 03/30/2012 FINDINGS: CT HEAD FINDINGS Brain: Minimal  atrophy. Normal ventricular morphology. No midline shift or mass effect. White matter hypoattenuation especially in RIGHT frontal lobe. Otherwise normal appearance of brain parenchyma. No intracranial hemorrhage, mass lesion, evidence of acute infarction, or extra-axial fluid collection. Vascular: Unremarkable Skull: Intact Sinuses/Orbits: Clear Other: N/A CT CERVICAL SPINE FINDINGS Alignment: Normal, unchanged Skull base and vertebrae: Osseous demineralization. Visualized skullbase intact. Vertebral body heights maintained. No fracture or bone destruction. Scattered facet degenerative changes. Soft tissues and spinal canal: Prevertebral soft tissues normal thickness. Disc levels: Disc space narrowing with endplate spur formation at C4-C5 through C7-T1. Uncovertebral spurs encroach upon the C6-C7 neural foramina bilaterally greater on LEFT. Upper chest: Lung apices clear. Other: N/A IMPRESSION: Atrophy with small vessel chronic ischemic changes of deep cerebral white matter particularly in RIGHT lower lobe. No acute intracranial abnormalities. Degenerative disc and facet disease changes of the cervical spine. No acute cervical spine abnormalities. Electronically Signed   By: Lavonia Dana M.D.   On: 07/01/2017 13:28   Dg Knee Complete 4 Views Left Result Date: 07/01/2017 CLINICAL DATA:  Initial evaluation for acute trauma, fall. EXAM: LEFT KNEE - COMPLETE 4+ VIEW COMPARISON:  Prior radiograph from 01/06/2017. FINDINGS: No acute fracture or dislocation. Moderate joint effusion within the suprapatellar recess. Osteochondral defect again noted at the medial femoral condyle. Degenerative osteoarthritic changes with juxta-articular osteophytic spurring and subchondral sclerosis noted, most prevalent at the medial femorotibial joint space compartment. No acute soft tissue abnormality. IMPRESSION: 1. No acute osseous abnormality about the left knee. 2. Moderate joint effusion. 3. Chronic osteochondral defect at the  medial femoral condyle. 4. Moderate degenerative  osteoarthrosis. Electronically Signed   By: Jeannine Boga M.D.   On: 07/01/2017 13:10   Dg Knee Complete 4 Views Right Result Date: 07/01/2017 CLINICAL DATA:  Initial evaluation for acute trauma, fall. EXAM: RIGHT KNEE - COMPLETE 4+ VIEW COMPARISON:  Prior radiograph from 01/06/2017. FINDINGS: No acute fracture or dislocation. No appreciable joint effusion. Osseous mineralization within normal limits. Mild degenerative osteoarthritic changes with juxta-articular osteophytic spurring at the medial femorotibial joint space compartment. No acute soft tissue abnormality. IMPRESSION: 1. No acute osseous abnormality about the right knee. 2. Mild degenerative osteoarthrosis involving the medial femorotibial joint space compartment. Electronically Signed   By: Jeannine Boga M.D.   On: 07/01/2017 13:13    1430:  XR/CT as above. Pt ambulated, only able to take 2 steps before c/o increasing SOB and O2 Sat dropped to 88%. Pt does not have home O2 any longer. Will obtain labs, admit.   1610:  CM Jessica called: cannot obtain home O2 from ED, needs PMD or admit to qualify. Will dose pain meds and give short neb. Will observation admit for pain control and optimize lungs (hx COPD and continues to smoke, no apparent nebs/MDI at home, may need home O2 again).  T/C to [facility]. Lorin Mercy, case discussed, including:  HPI, pertinent PM/SHx, VS/PE, dx testing, ED course and treatment:  Agreeable to come to ED for evaluation.      Final Clinical Impressions(s) / ED Diagnoses   Final diagnoses:  None    New Prescriptions New Prescriptions   No medications on file     Francine Graven, DO 07/04/17 0814

## 2017-07-01 NOTE — H&P (Signed)
History and Physical    Tiffany Lynch SWF:[id number] DOB: [birth year] DOA: 07/01/2017  PCP: [facility]:  Reece Levy - psych; [facility] - pain clinic, first appt 10/30 Patient coming from: Home - lives with a friend; NOK: Son, [phone]  Chief Complaint: fall  HPI: Tiffany Lynch is a 56 y.o. female with medical history significant of OSA not on CPAP; HTN; psychiatric disease; COPD; and chronic pain presenting after a fall.  When asked what was happening she responded, "Not a whole lot, my kids are just going to shit.  ... "  With further probing about what brought her to the ER today specifically, she reported that she had arthoscopic surgery march 1 and her knee gives out on her at times.  It happened when she was trying to get out of the shower today.  The patient was told she cracked 2 ribs.  [facility] but came to the ER instead.  She has been feeling tired, SOB.  Minimal cough.  +wheezing.  She was on O2 for OSA? about 6-8 years ago but she didn't like using it.  Dr. Merlene Laughter came back and took it since she wasn't using it.  She wore the O2 at night during sleep.  She doesn't get around well, has gained about 50 pounds, isn't able to do the things she used to.     ED Course: Hypoxia to 88% with ambulation.  Unable to arrange O2 from the ER.  Given pain medication and a neb treatment.  Review of Systems: As per HPI; otherwise review of systems reviewed and negative.   Ambulatory Status:  Ambulates with a walker or wears her knee brace  Past Medical History:  Diagnosis Date  . Anxiety   . Bipolar 1 disorder ([place])   . Chronic back pain   . Chronic knee pain   . COPD (chronic obstructive pulmonary disease) ([place])   . Depression   . GERD (gastroesophageal reflux disease)   . History of anemia   . Hypertension   . Lumbar radiculopathy   . On home oxygen therapy "6 years ago"   3LPM PRN: "had  this 6 years ago," pt no longer has O2 at home (07/01/2017)  . Sleep apnea     Past Surgical History:  Procedure Laterality Date  . COLONOSCOPY WITH PROPOFOL N/A 07/18/2014   Procedure: COLONOSCOPY WITH PROPOFOL;  Surgeon: Rogene Houston, MD;  Location: [facility];  Service: Endoscopy;  Laterality: N/A;  in cecum at 0753, withdrawal time 20 min  . DILATION AND CURETTAGE OF UTERUS  age 61  . KNEE ARTHROSCOPY WITH MEDIAL MENISECTOMY Left 10/10/2016   Procedure: KNEE ARTHROSCOPY WITH MEDIAL MENISECTOMY REMOVAL LOSE BODY;  Surgeon: Carole Civil, MD;  Location: [facility];  Service: Orthopedics;  Laterality: Left;  . POLYPECTOMY  05/22/2012   Procedure: POLYPECTOMY;  Surgeon: Jonnie Kind, MD;  Location: [facility];  Service: Gynecology;  Laterality: N/A;  Endometrial Polypectomy  . POLYPECTOMY N/A 07/18/2014   Procedure: POLYPECTOMY;  Surgeon: Rogene Houston, MD;  Location: [facility];  Service: Endoscopy;  Laterality: N/A;  . TUBAL LIGATION      Social History   Social History  . Marital status: Divorced    Spouse name: N/A  . Number of children: N/A  . Years of education: N/A   Occupational History  . disabled    Social History Main Topics  . Smoking status:  Current Every Day Smoker    Packs/day: 1.00    Years: 41.00    Types: Cigarettes  . Smokeless tobacco: Never Used  . Alcohol use 1.2 - 1.8 oz/week    2 - 3 Glasses of wine per week     Comment: drinks wine several times a week  . Drug use: No     Comment: history of cocaine and marijuana use, reports no use in about 6 months  . Sexual activity: Yes    Birth control/ protection: Surgical     Comment: tubes tied   Other Topics Concern  . Not on file   Social History Narrative  . No narrative on file    No Known Allergies  Family History  Problem Relation Age of Onset  . Hypertension Mother   . Hypertension Brother   . Anxiety disorder Brother   . Depression Brother     Prior to Admission medications   Medication  Sig Start Date End Date Taking? Authorizing Provider  ALPRAZolam Duanne Moron) 1 MG tablet Take 1 mg by mouth 4 (four) times daily. 09/24/16  Yes [provider]  amLODipine (NORVASC) 5 MG tablet Take 5 mg by mouth daily. 06/16/17  Yes [provider]  buPROPion (WELLBUTRIN XL) 300 MG 24 hr tablet Take 300 mg by mouth every morning.   Yes [provider]  busPIRone (BUSPAR) 15 MG tablet Take 15 mg by mouth 3 (three) times daily. 07/27/16  Yes [provider]  cyclobenzaprine (FLEXERIL) 10 MG tablet Take 10 mg by mouth 3 (three) times daily as needed for muscle spasms.  09/10/16  Yes [provider]  DULoxetine (CYMBALTA) 60 MG capsule Take 60 mg by mouth every morning.    Yes [provider]  ibuprofen (ADVIL,MOTRIN) 800 MG tablet Take 800 mg by mouth 3 (three) times daily. 09/16/16  Yes [provider]  loratadine (CLARITIN) 10 MG tablet Take 10 mg by mouth daily. 04/10/17  Yes [provider]  metoprolol tartrate (LOPRESSOR) 25 MG tablet Take 25 mg by mouth 2 (two) times daily. 06/16/17  Yes [provider]  omeprazole (PRILOSEC) 20 MG capsule Take 20 mg by mouth daily. 07/29/16  Yes [provider]  risperiDONE (RISPERDAL) 3 MG tablet Take 3 mg by mouth at bedtime. 05/23/16  Yes [provider]  traZODone (DESYREL) 100 MG tablet Take 300 mg by mouth at bedtime. 07/01/16  Yes [provider]  VOLTAREN 1 % GEL Apply 1 g topically 4 (four) times daily as needed for pain. 09/16/16  Yes [provider]    Physical Exam: Vitals:   07/01/17 1758 07/01/17 1945 07/01/17 1947 07/01/17 2158  BP: 139/90 120/73  129/71  Pulse: 73 74  77  Resp: 18 20  18   Temp:  97.8 F (36.6 C)  98.2 F (36.8 C)  TempSrc:  Oral  Oral  SpO2: 94% 100% 95% 95%  Weight:  101.1 kg (222 lb 14.2 oz)    Height:  5\' 3"  (1.6 m)       General:  Appears calm and comfortable and is NAD Eyes:  PERRL, EOMI, normal lids,  iris ENT:  grossly normal hearing, lips & tongue, mmm Neck:  no LAD, masses or thyromegaly Cardiovascular:  RRR, no m/r/g. No LE edema.  Respiratory:   CTA bilaterally with no wheezes/rales/rhonchi.  Normal respiratory effort. Abdomen:  soft, NT, ND, NABS Skin:  no rash or induration seen on limited exam Musculoskeletal:  grossly normal tone BUE/BLE,  good ROM, no bony abnormality Lower extremity:  No LE edema.   2+ distal pulses. Psychiatric:  grossly normal mood and affect, speech fluent and appropriate, AOx3 Neurologic:  CN 2-12 grossly intact, moves all extremities in coordinated fashion, sensation intact    Radiological Exams on Admission: Dg Ribs Unilateral W/chest Right  Result Date: 07/01/2017 CLINICAL DATA:  Initial evaluation for acute trauma, fall. EXAM: RIGHT RIBS AND CHEST - 3+ VIEW COMPARISON:  Prior radiograph from 01/06/2017. FINDINGS: Moderate cardiomegaly, stable from previous. Mediastinal silhouette normal. Lungs mildly hypoinflated. Diffuse vascular congestion without overt pulmonary edema. No pleural effusion. No focal infiltrates. No pneumothorax. Metallic BB marker overlies the lateral lower right ribs at site of pain. Remotely healed fracture of the right posterior fourth rib noted. There are acute minimally displaced fractures of the right posterolateral seventh and eighth ribs. No other acute displaced rib fracture identified. No other acute osseous abnormality. IMPRESSION: 1. Acute minimally displaced fractures of the right posterolateral seventh and eighth ribs. 2. Cardiomegaly with mild diffuse pulmonary vascular congestion without overt pulmonary edema. Electronically Signed   By: Jeannine Boga M.D.   On: 07/01/2017 13:19   Dg Lumbar Spine Complete  Result Date: 07/01/2017 CLINICAL DATA:  Initial evaluation for acute trauma, fall. EXAM: LUMBAR SPINE - COMPLETE 4+ VIEW COMPARISON:  Prior MRI from 03/01/2017. FINDINGS: Five non rib-bearing lumbar type  vertebral bodies. Vertebral bodies normally aligned with preservation of the normal lumbar lordosis. No listhesis or subluxation. Vertebral body heights maintained. Sacrum intact. Mild multilevel degenerative spondylolysis at T12-L1 through L5-S1, stable from prior. Aortic atherosclerosis. IMPRESSION: 1. No acute abnormality within the lumbar spine. 2. Stable mild multilevel degenerative spondylolysis. 3. Aortic atherosclerosis. Electronically Signed   By: Jeannine Boga M.D.   On: 07/01/2017 13:20   Ct Head Wo Contrast  Result Date: 07/01/2017 CLINICAL DATA:  Fall, with stepping out of a high bathtub, fell backwards into tub, head trauma EXAM: CT HEAD WITHOUT CONTRAST CT CERVICAL SPINE WITHOUT CONTRAST TECHNIQUE: Multidetector CT imaging of the head and cervical spine was performed following the standard protocol without intravenous contrast. Multiplanar CT image reconstructions of the cervical spine were also generated. COMPARISON:  Cervical spine CT 12/21/2013, CT head 03/30/2012 FINDINGS: CT HEAD FINDINGS Brain: Minimal atrophy. Normal ventricular morphology. No midline shift or mass effect. White matter hypoattenuation especially in RIGHT frontal lobe. Otherwise normal appearance of brain parenchyma. No intracranial hemorrhage, mass lesion, evidence of acute infarction, or extra-axial fluid collection. Vascular: Unremarkable Skull: Intact Sinuses/Orbits: Clear Other: N/A CT CERVICAL SPINE FINDINGS Alignment: Normal, unchanged Skull base and vertebrae: Osseous demineralization. Visualized skullbase intact. Vertebral body heights maintained. No fracture or bone destruction. Scattered facet degenerative changes. Soft tissues and spinal canal: Prevertebral soft tissues normal thickness. Disc levels: Disc space narrowing with endplate spur formation at C4-C5 through C7-T1. Uncovertebral spurs encroach upon the C6-C7 neural foramina bilaterally greater on LEFT. Upper chest: Lung apices clear. Other: N/A  IMPRESSION: Atrophy with small vessel chronic ischemic changes of deep cerebral white matter particularly in RIGHT lower lobe. No acute intracranial abnormalities. Degenerative disc and facet disease changes of the cervical spine. No acute cervical spine abnormalities. Electronically Signed   By: Lavonia Dana M.D.   On: 07/01/2017 13:28   Ct Cervical Spine Wo Contrast  Result Date: 07/01/2017 CLINICAL DATA:  Fall, with stepping out of a high bathtub, fell backwards into tub, head trauma EXAM: CT HEAD WITHOUT CONTRAST CT CERVICAL SPINE WITHOUT CONTRAST TECHNIQUE: Multidetector CT imaging of the head and  cervical spine was performed following the standard protocol without intravenous contrast. Multiplanar CT image reconstructions of the cervical spine were also generated. COMPARISON:  Cervical spine CT 12/21/2013, CT head 03/30/2012 FINDINGS: CT HEAD FINDINGS Brain: Minimal atrophy. Normal ventricular morphology. No midline shift or mass effect. White matter hypoattenuation especially in RIGHT frontal lobe. Otherwise normal appearance of brain parenchyma. No intracranial hemorrhage, mass lesion, evidence of acute infarction, or extra-axial fluid collection. Vascular: Unremarkable Skull: Intact Sinuses/Orbits: Clear Other: N/A CT CERVICAL SPINE FINDINGS Alignment: Normal, unchanged Skull base and vertebrae: Osseous demineralization. Visualized skullbase intact. Vertebral body heights maintained. No fracture or bone destruction. Scattered facet degenerative changes. Soft tissues and spinal canal: Prevertebral soft tissues normal thickness. Disc levels: Disc space narrowing with endplate spur formation at C4-C5 through C7-T1. Uncovertebral spurs encroach upon the C6-C7 neural foramina bilaterally greater on LEFT. Upper chest: Lung apices clear. Other: N/A IMPRESSION: Atrophy with small vessel chronic ischemic changes of deep cerebral white matter particularly in RIGHT lower lobe. No acute intracranial abnormalities.  Degenerative disc and facet disease changes of the cervical spine. No acute cervical spine abnormalities. Electronically Signed   By: Lavonia Dana M.D.   On: 07/01/2017 13:28   Dg Knee Complete 4 Views Left  Result Date: 07/01/2017 CLINICAL DATA:  Initial evaluation for acute trauma, fall. EXAM: LEFT KNEE - COMPLETE 4+ VIEW COMPARISON:  Prior radiograph from 01/06/2017. FINDINGS: No acute fracture or dislocation. Moderate joint effusion within the suprapatellar recess. Osteochondral defect again noted at the medial femoral condyle. Degenerative osteoarthritic changes with juxta-articular osteophytic spurring and subchondral sclerosis noted, most prevalent at the medial femorotibial joint space compartment. No acute soft tissue abnormality. IMPRESSION: 1. No acute osseous abnormality about the left knee. 2. Moderate joint effusion. 3. Chronic osteochondral defect at the medial femoral condyle. 4. Moderate degenerative osteoarthrosis. Electronically Signed   By: Jeannine Boga M.D.   On: 07/01/2017 13:10   Dg Knee Complete 4 Views Right  Result Date: 07/01/2017 CLINICAL DATA:  Initial evaluation for acute trauma, fall. EXAM: RIGHT KNEE - COMPLETE 4+ VIEW COMPARISON:  Prior radiograph from 01/06/2017. FINDINGS: No acute fracture or dislocation. No appreciable joint effusion. Osseous mineralization within normal limits. Mild degenerative osteoarthritic changes with juxta-articular osteophytic spurring at the medial femorotibial joint space compartment. No acute soft tissue abnormality. IMPRESSION: 1. No acute osseous abnormality about the right knee. 2. Mild degenerative osteoarthrosis involving the medial femorotibial joint space compartment. Electronically Signed   By: Jeannine Boga M.D.   On: 07/01/2017 13:13    EKG: not done   Labs on Admission: I have personally reviewed the available labs and imaging studies at the time of the admission.  Pertinent labs:   Glucose 112 WBC 11.8 UA;  small LE, rare bacteria UDS positive for BZD  Assessment/Plan Principal Problem:   Respiratory failure with hypoxia ([facility]) Active Problems:   Bipolar 1 disorder ([facility])   COPD (chronic obstructive pulmonary disease) ([facility])   Alcohol abuse   Chronic pain   Rib fractures   Respiratory failure with hypoxia, likely due to COPD and ongoing tobacco dependence -Patient with h/o reported O2 requirement at night in the past, possibly due to OSA -Has reported h/o COPD but is not taking any medications for this  -Ongoing tobacco dependence -Hypoxia to 88% with ambulation in the ER -Will start Duoneb q6h and Albuterol q2h prn cough/wheezing/SOB -If patient still has hypoxia tomorrow, can likely qualify for home O2 prior to d/c -Does not complain of any new symptoms  concerning for acute infection or COPD exacerbation -Tobacco Dependence: encourage cessation.  This was discussed with the patient and should be reviewed on an ongoing basis.   -Patch ordered at patient request.  Rib fractures -Likely occurred in today's fall -Will use Tramadol and Lidocaine patch for prn use -With reported h/o alcohol abuse and chronic pain, I would be reluctant to escalate therapy unless truly needed -Monitor for seizure activity since both Tramadol and Wellbutrin can lower the seizure threshold  Bipolar -She takes an abundance of psychiatric medications to include: Xanax; Wellbutrin; Buspar; Cymbalta, Risperdal, and Trazodone -Will continue home medications at this time  Chronic pain -I have reviewed this patient in the [place] Controlled Substances Reporting System.  She is generally receiving medications from only one provider and appears to be taking them as prescribed. -She is not at particularly high risk of opioid misuse, diversion, or overdose.   DVT prophylaxis: Lovenox Code Status:  Full - confirmed with patient Family Communication: None present Disposition Plan:  Home once clinically improved Consults  called: None  Admission status: It is my clinical opinion that referral for OBSERVATION is reasonable and necessary in this patient based on the above information provided. The aforementioned taken together are felt to place the patient at high risk for further clinical deterioration. However it is anticipated that the patient may be medically stable for discharge from the hospital within 24 to 48 hours.    Karmen Bongo MD [facility]  If note is complete, please contact covering daytime or nighttime physician. [email or web address] Password [email or web address]  07/01/2017, 10:29 PM

## 2017-07-01 NOTE — ED Notes (Signed)
Attempted to ambulate pt, due to pain only able to take 2 steps. O2 sat on room air 90 % when pt stood up decrease to 88-89 % with steps and getting back in bed

## 2017-07-01 NOTE — ED Triage Notes (Signed)
Pt brought in due to fall from [facility]. States she was stepping out of tub and tub is high and caused her to fall back into tub. Has bilateral knee pain and pain right rib pain under breast. States she flipped around in the tub several times

## 2017-07-02 ENCOUNTER — Encounter (HOSPITAL_COMMUNITY): Payer: Self-pay | Admitting: Family Medicine

## 2017-07-02 DIAGNOSIS — J9611 Chronic respiratory failure with hypoxia: Secondary | ICD-10-CM

## 2017-07-02 DIAGNOSIS — F319 Bipolar disorder, unspecified: Secondary | ICD-10-CM | POA: Diagnosis not present

## 2017-07-02 DIAGNOSIS — G894 Chronic pain syndrome: Secondary | ICD-10-CM | POA: Diagnosis not present

## 2017-07-02 DIAGNOSIS — F1010 Alcohol abuse, uncomplicated: Secondary | ICD-10-CM | POA: Diagnosis not present

## 2017-07-02 DIAGNOSIS — J42 Unspecified chronic bronchitis: Secondary | ICD-10-CM

## 2017-07-02 DIAGNOSIS — S2249XD Multiple fractures of ribs, unspecified side, subsequent encounter for fracture with routine healing: Secondary | ICD-10-CM | POA: Diagnosis not present

## 2017-07-02 LAB — CBC
HCT: 43.5 % (ref 36.0–46.0)
Hemoglobin: 14.4 g/dL (ref 12.0–15.0)
MCH: 31.1 pg (ref 26.0–34.0)
MCHC: 33.1 g/dL (ref 30.0–36.0)
MCV: 94 fL (ref 78.0–100.0)
Platelets: 289 10*3/uL (ref 150–400)
RBC: 4.63 MIL/uL (ref 3.87–5.11)
RDW: 14.9 % (ref 11.5–15.5)
WBC: 10.5 10*3/uL (ref 4.0–10.5)

## 2017-07-02 LAB — BASIC METABOLIC PANEL
Anion gap: 10 (ref 5–15)
BUN: 11 mg/dL (ref 6–20)
CO2: 30 mmol/L (ref 22–32)
Calcium: 8.6 mg/dL — ABNORMAL LOW (ref 8.9–10.3)
Chloride: 98 mmol/L — ABNORMAL LOW (ref 101–111)
Creatinine, Ser: 0.82 mg/dL (ref 0.44–1.00)
GFR calc Af Amer: >60 mL/min (ref >60)
GFR calc non Af Amer: >60 mL/min (ref >60)
Glucose, Bld: 140 mg/dL — ABNORMAL HIGH (ref 65–99)
Potassium: 2.9 mmol/L — ABNORMAL LOW (ref 3.5–5.1)
Sodium: 138 mmol/L (ref 135–145)

## 2017-07-02 LAB — MAGNESIUM: Magnesium: 1.7 mg/dL (ref 1.7–2.4)

## 2017-07-02 MED ORDER — LIDOCAINE 5 % EX PTCH: 1.0000 | MEDICATED_PATCH | CUTANEOUS | 0 refills | Status: DC

## 2017-07-02 MED ORDER — POTASSIUM CHLORIDE CRYS ER 20 MEQ PO TBCR
60.0000 meq | EXTENDED_RELEASE_TABLET | Freq: Once | ORAL | Status: AC
  Administered 2017-07-02: 60 meq via ORAL
  Filled 2017-07-02: qty 3

## 2017-07-02 MED ORDER — PREDNISONE 20 MG PO TABS: ORAL_TABLET | ORAL | 0 refills | Status: DC

## 2017-07-02 MED ORDER — IPRATROPIUM-ALBUTEROL 0.5-2.5 (3) MG/3ML IN SOLN: 3.0000 mL | Freq: Four times a day (QID) | RESPIRATORY_TRACT | 0 refills | Status: DC | PRN

## 2017-07-02 MED ORDER — DOCUSATE SODIUM 100 MG PO CAPS: 100.0000 mg | ORAL_CAPSULE | Freq: Two times a day (BID) | ORAL | 0 refills | Status: DC

## 2017-07-02 MED ORDER — TRAMADOL HCL 50 MG PO TABS: 50.0000 mg | ORAL_TABLET | Freq: Four times a day (QID) | ORAL | 0 refills | Status: DC | PRN

## 2017-07-02 MED ORDER — ORAL CARE MOUTH RINSE: 15.0000 mL | Freq: Two times a day (BID) | OROMUCOSAL | Status: DC

## 2017-07-02 NOTE — Progress Notes (Signed)
SATURATION QUALIFICATIONS: (This note is used to comply with regulatory documentation for home oxygen)  Patient Saturations on Room Air at Rest = 86%  Patient Saturations on Room Air while Ambulating = %  Patient Saturations on  Liters of oxygen while Ambulating = %  Please briefly explain why patient needs home oxygen: desats when off of O2

## 2017-07-02 NOTE — Progress Notes (Signed)
Patients O2 sats decreased to 86% while resting this afternoon.  Applied 2L O2, sats increased to 94%.  Alerted CM for home O2

## 2017-07-02 NOTE — Discharge Summary (Signed)
Physician Discharge Summary  Tiffany Lynch PIR:[id number] DOB: 19-Mar-1961 DOA: 07/01/2017  PCP: Sandria Manly [place] date: 63/09/6008 Discharge date: 07/02/2017  Admitted From: Home  Disposition: Home   Recommendations for Outpatient Follow-up:  1. Follow up with PCP in 1 weeks 2. Please obtain BMP/CBC in one week 3. Please evaluate ongoing need for home oxygen and home nebulizer treatments on outpatient follow up 4. Please arrange for outpatient sleep study.  5. Please continue to work with patient on tobacco cessation.   Discharge Condition: STABLE   CODE STATUS: FULL    Brief Hospitalization Summary: Please see all hospital notes, images, labs for full details of the hospitalization. HPI: Tiffany Lynch is a 56 y.o. female with medical history significant of OSA not on CPAP; HTN; psychiatric disease; COPD; and chronic pain presenting after a fall.  When asked what was happening she responded, "Not a whole lot, my kids are just going to shit.  ... "  With further probing about what brought her to the ER today specifically, she reported that she had arthoscopic surgery march 1 and her knee gives out on her at times.  It happened when she was trying to get out of the shower today.  The patient was told she cracked 2 ribs.  [facility] but came to the ER instead.  She has been feeling tired, SOB.  Minimal cough.  +wheezing.  She was on O2 for OSA? about 6-8 years ago but she didn't like using it.  Dr. Merlene Laughter came back and took it since she wasn't using it.  She wore the O2 at night during sleep.  She doesn't get around well, has gained about 50 pounds, isn't able to do the things she used to.    Respiratory failure with hypoxia, likely due to COPD and ongoing tobacco dependence -Patient with h/o reported O2 requirement at night in the past, possibly due to OSA -Has reported h/o COPD but is not taking any  medications for this  -Ongoing tobacco dependence -Hypoxia to 88% with ambulation in the ER - Given Duoneb q6h and Albuterol q2h prn cough/wheezing/SOB in the hospital with good results.  - Discharge home on 2L/min nasal cannula home oxygen and a home nebulizer and medications also prescribed. -With ongoing tobacco use I have strongly advised and warned patient not to smoke near the oxygen tank.  I have counseled extensively to stop smoking and using tobacco products.  Patient verbalized understanding.  I also gave her written information about home oxygen use and smoking cessation materials.  Rib fractures -Likely occurred from yesterday's fall -Will use Tramadol and Lidocaine patch for prn use -With reported h/o alcohol abuse and chronic pain, I would be reluctant to escalate therapy unless truly needed -Monitored for seizure activity since both Tramadol and Wellbutrin can lower the seizure threshold - She had no seizure activity in hospital.   Bipolar -She takes an abundance of psychiatric medications to include: Xanax; Wellbutrin; Buspar; Cymbalta, Risperdal, and Trazodone.  She was strongly advised to follow-up with her behavioral medicine specialists. -Continue home medications at this time  Chronic pain -Reviewed this patient in the [place] Controlled Substances Reporting System.  She is generally receiving medications from only one provider and appears to be taking them as prescribed. -She is not at particularly high risk of opioid misuse, diversion, or overdose.  She was prescribed 20 tablets of tramadol for severe pain related  to rib fractures.  She has an outpatient appointment to see a pain management clinic on 10/30.  She was advised that she may not drive or operate machinery while under the influence of controlled substances and she verbalized understanding.  DVT prophylaxis: Lovenox Code Status:  Full - confirmed with patient Family Communication: None present Disposition  Plan:  Home   Discharge Diagnoses:  Principal Problem:   Respiratory failure with hypoxia ([place]) Active Problems:   Bipolar 1 disorder (HCC)   COPD (chronic obstructive pulmonary disease) (HCC)   Alcohol abuse   Chronic pain   Rib fractures   Tobacco dependence    Discharge Instructions: Discharge Instructions    Call MD for:  difficulty breathing, headache or visual disturbances    Complete by:  As directed    Call MD for:  extreme fatigue    Complete by:  As directed    Call MD for:  hives    Complete by:  As directed    Call MD for:  persistant dizziness or light-headedness    Complete by:  As directed    Call MD for:  persistant nausea and vomiting    Complete by:  As directed    Call MD for:  severe uncontrolled pain    Complete by:  As directed    Call MD for:  temperature >100.4    Complete by:  As directed    Driving Restrictions    Complete by:  As directed    No driving or operating machinery while taking controlled substance medications   Increase activity slowly    Complete by:  As directed      Allergies as of 07/02/2017   No Known Allergies     Medication List    STOP taking these medications   ibuprofen 800 MG tablet Commonly known as:  ADVIL,MOTRIN     TAKE these medications   ALPRAZolam 1 MG tablet Commonly known as:  XANAX Take 1 mg by mouth 4 (four) times daily.   amLODipine 5 MG tablet Commonly known as:  NORVASC Take 5 mg by mouth daily.   buPROPion 300 MG 24 hr tablet Commonly known as:  WELLBUTRIN XL Take 300 mg by mouth every morning.   busPIRone 15 MG tablet Commonly known as:  BUSPAR Take 15 mg by mouth 3 (three) times daily.   cyclobenzaprine 10 MG tablet Commonly known as:  FLEXERIL Take 10 mg by mouth 3 (three) times daily as needed for muscle spasms.   docusate sodium 100 MG capsule Commonly known as:  COLACE Take 1 capsule (100 mg total) by mouth 2 (two) times daily.   DULoxetine 60 MG capsule Commonly known as:   CYMBALTA Take 60 mg by mouth every morning.   ipratropium-albuterol 0.5-2.5 (3) MG/3ML Soln Commonly known as:  DUONEB Take 3 mLs by nebulization every 6 (six) hours as needed.   lidocaine 5 % Commonly known as:  LIDODERM Place 1 patch onto the skin daily. Remove & Discard patch within 12 hours or as directed by MD   loratadine 10 MG tablet Commonly known as:  CLARITIN Take 10 mg by mouth daily.   metoprolol tartrate 25 MG tablet Commonly known as:  LOPRESSOR Take 25 mg by mouth 2 (two) times daily.   omeprazole 20 MG capsule Commonly known as:  PRILOSEC Take 20 mg by mouth daily.   predniSONE 20 MG tablet Commonly known as:  DELTASONE Take 3 PO QAM x3days, 2 PO QAM x3days, 1 PO  QAM x3days   risperiDONE 3 MG tablet Commonly known as:  RISPERDAL Take 3 mg by mouth at bedtime.   traMADol 50 MG tablet Commonly known as:  ULTRAM Take 1 tablet (50 mg total) by mouth every 6 (six) hours as needed for severe pain.   traZODone 100 MG tablet Commonly known as:  DESYREL Take 300 mg by mouth at bedtime.   VOLTAREN 1 % Gel Generic drug:  diclofenac sodium Apply 1 g topically 4 (four) times daily as needed for pain.            Durable Medical Equipment        Start     Ordered   07/02/17 0933  For home use only DME oxygen  Once    Question Answer Comment  Mode or (Route) Nasal cannula   Liters per Minute 2   Frequency Continuous (stationary and portable oxygen unit needed)   Oxygen conserving device Yes   Oxygen delivery system Gas      07/02/17 0932   07/02/17 0933  For home use only DME Nebulizer machine  Once    Question Answer Comment  Patient needs a nebulizer to treat with the following condition Chronic respiratory failure ([place])   Patient needs a nebulizer to treat with the following condition COPD (chronic obstructive pulmonary disease) ([place])   Patient needs a nebulizer to treat with the following condition COPD (chronic obstructive pulmonary disease) with  acute bronchitis ([place])      07/02/17 [place], [facility]. Schedule an appointment as soon as possible for a visit in 1 week(s).   Contact information: [address] [place] [address] [phone]          No Known Allergies Current Discharge Medication List    START taking these medications   Details  docusate sodium (COLACE) 100 MG capsule Take 1 capsule (100 mg total) by mouth 2 (two) times daily. Qty: 10 capsule, Refills: 0    ipratropium-albuterol (DUONEB) 0.5-2.5 (3) MG/3ML SOLN Take 3 mLs by nebulization every 6 (six) hours as needed. Qty: 360 mL, Refills: 0    lidocaine (LIDODERM) 5 % Place 1 patch onto the skin daily. Remove & Discard patch within 12 hours or as directed by MD Qty: 15 patch, Refills: 0    predniSONE (DELTASONE) 20 MG tablet Take 3 PO QAM x3days, 2 PO QAM x3days, 1 PO QAM x3days Qty: 18 tablet, Refills: 0    traMADol (ULTRAM) 50 MG tablet Take 1 tablet (50 mg total) by mouth every 6 (six) hours as needed for severe pain. Qty: 20 tablet, Refills: 0      CONTINUE these medications which have NOT CHANGED   Details  ALPRAZolam (XANAX) 1 MG tablet Take 1 mg by mouth 4 (four) times daily. Refills: 0    amLODipine (NORVASC) 5 MG tablet Take 5 mg by mouth daily. Refills: 6    buPROPion (WELLBUTRIN XL) 300 MG 24 hr tablet Take 300 mg by mouth every morning.    busPIRone (BUSPAR) 15 MG tablet Take 15 mg by mouth 3 (three) times daily. Refills: 3    cyclobenzaprine (FLEXERIL) 10 MG tablet Take 10 mg by mouth 3 (three) times daily as needed for muscle spasms.  Refills: 3    DULoxetine (CYMBALTA) 60 MG capsule Take 60 mg by mouth every morning.     loratadine (CLARITIN) 10 MG tablet Take 10 mg by mouth daily. Refills: 5  metoprolol tartrate (LOPRESSOR) 25 MG tablet Take 25 mg by mouth 2 (two) times daily. Refills: 6    omeprazole (PRILOSEC) 20 MG capsule Take 20 mg by mouth daily. Refills: 3     risperiDONE (RISPERDAL) 3 MG tablet Take 3 mg by mouth at bedtime. Refills: 3    traZODone (DESYREL) 100 MG tablet Take 300 mg by mouth at bedtime. Refills: 3    VOLTAREN 1 % GEL Apply 1 g topically 4 (four) times daily as needed for pain. Refills: 5      STOP taking these medications     ibuprofen (ADVIL,MOTRIN) 800 MG tablet         Procedures/Studies: Dg Ribs Unilateral W/chest Right  Result Date: 07/01/2017 CLINICAL DATA:  Initial evaluation for acute trauma, fall. EXAM: RIGHT RIBS AND CHEST - 3+ VIEW COMPARISON:  Prior radiograph from 01/06/2017. FINDINGS: Moderate cardiomegaly, stable from previous. Mediastinal silhouette normal. Lungs mildly hypoinflated. Diffuse vascular congestion without overt pulmonary edema. No pleural effusion. No focal infiltrates. No pneumothorax. Metallic BB marker overlies the lateral lower right ribs at site of pain. Remotely healed fracture of the right posterior fourth rib noted. There are acute minimally displaced fractures of the right posterolateral seventh and eighth ribs. No other acute displaced rib fracture identified. No other acute osseous abnormality. IMPRESSION: 1. Acute minimally displaced fractures of the right posterolateral seventh and eighth ribs. 2. Cardiomegaly with mild diffuse pulmonary vascular congestion without overt pulmonary edema. Electronically Signed   By: Jeannine Boga M.D.   On: 07/01/2017 13:19   Dg Lumbar Spine Complete  Result Date: 07/01/2017 CLINICAL DATA:  Initial evaluation for acute trauma, fall. EXAM: LUMBAR SPINE - COMPLETE 4+ VIEW COMPARISON:  Prior MRI from 03/01/2017. FINDINGS: Five non rib-bearing lumbar type vertebral bodies. Vertebral bodies normally aligned with preservation of the normal lumbar lordosis. No listhesis or subluxation. Vertebral body heights maintained. Sacrum intact. Mild multilevel degenerative spondylolysis at T12-L1 through L5-S1, stable from prior. Aortic atherosclerosis.  IMPRESSION: 1. No acute abnormality within the lumbar spine. 2. Stable mild multilevel degenerative spondylolysis. 3. Aortic atherosclerosis. Electronically Signed   By: Jeannine Boga M.D.   On: 07/01/2017 13:20   Ct Head Wo Contrast  Result Date: 07/01/2017 CLINICAL DATA:  Fall, with stepping out of a high bathtub, fell backwards into tub, head trauma EXAM: CT HEAD WITHOUT CONTRAST CT CERVICAL SPINE WITHOUT CONTRAST TECHNIQUE: Multidetector CT imaging of the head and cervical spine was performed following the standard protocol without intravenous contrast. Multiplanar CT image reconstructions of the cervical spine were also generated. COMPARISON:  Cervical spine CT 12/21/2013, CT head 03/30/2012 FINDINGS: CT HEAD FINDINGS Brain: Minimal atrophy. Normal ventricular morphology. No midline shift or mass effect. White matter hypoattenuation especially in RIGHT frontal lobe. Otherwise normal appearance of brain parenchyma. No intracranial hemorrhage, mass lesion, evidence of acute infarction, or extra-axial fluid collection. Vascular: Unremarkable Skull: Intact Sinuses/Orbits: Clear Other: N/A CT CERVICAL SPINE FINDINGS Alignment: Normal, unchanged Skull base and vertebrae: Osseous demineralization. Visualized skullbase intact. Vertebral body heights maintained. No fracture or bone destruction. Scattered facet degenerative changes. Soft tissues and spinal canal: Prevertebral soft tissues normal thickness. Disc levels: Disc space narrowing with endplate spur formation at C4-C5 through C7-T1. Uncovertebral spurs encroach upon the C6-C7 neural foramina bilaterally greater on LEFT. Upper chest: Lung apices clear. Other: N/A IMPRESSION: Atrophy with small vessel chronic ischemic changes of deep cerebral white matter particularly in RIGHT lower lobe. No acute intracranial abnormalities. Degenerative disc and facet disease changes of the cervical  spine. No acute cervical spine abnormalities. Electronically Signed    By: Lavonia Dana M.D.   On: 07/01/2017 13:28   Ct Cervical Spine Wo Contrast  Result Date: 07/01/2017 CLINICAL DATA:  Fall, with stepping out of a high bathtub, fell backwards into tub, head trauma EXAM: CT HEAD WITHOUT CONTRAST CT CERVICAL SPINE WITHOUT CONTRAST TECHNIQUE: Multidetector CT imaging of the head and cervical spine was performed following the standard protocol without intravenous contrast. Multiplanar CT image reconstructions of the cervical spine were also generated. COMPARISON:  Cervical spine CT 12/21/2013, CT head 03/30/2012 FINDINGS: CT HEAD FINDINGS Brain: Minimal atrophy. Normal ventricular morphology. No midline shift or mass effect. White matter hypoattenuation especially in RIGHT frontal lobe. Otherwise normal appearance of brain parenchyma. No intracranial hemorrhage, mass lesion, evidence of acute infarction, or extra-axial fluid collection. Vascular: Unremarkable Skull: Intact Sinuses/Orbits: Clear Other: N/A CT CERVICAL SPINE FINDINGS Alignment: Normal, unchanged Skull base and vertebrae: Osseous demineralization. Visualized skullbase intact. Vertebral body heights maintained. No fracture or bone destruction. Scattered facet degenerative changes. Soft tissues and spinal canal: Prevertebral soft tissues normal thickness. Disc levels: Disc space narrowing with endplate spur formation at C4-C5 through C7-T1. Uncovertebral spurs encroach upon the C6-C7 neural foramina bilaterally greater on LEFT. Upper chest: Lung apices clear. Other: N/A IMPRESSION: Atrophy with small vessel chronic ischemic changes of deep cerebral white matter particularly in RIGHT lower lobe. No acute intracranial abnormalities. Degenerative disc and facet disease changes of the cervical spine. No acute cervical spine abnormalities. Electronically Signed   By: Lavonia Dana M.D.   On: 07/01/2017 13:28   Dg Knee Complete 4 Views Left  Result Date: 07/01/2017 CLINICAL DATA:  Initial evaluation for acute trauma, fall.  EXAM: LEFT KNEE - COMPLETE 4+ VIEW COMPARISON:  Prior radiograph from 01/06/2017. FINDINGS: No acute fracture or dislocation. Moderate joint effusion within the suprapatellar recess. Osteochondral defect again noted at the medial femoral condyle. Degenerative osteoarthritic changes with juxta-articular osteophytic spurring and subchondral sclerosis noted, most prevalent at the medial femorotibial joint space compartment. No acute soft tissue abnormality. IMPRESSION: 1. No acute osseous abnormality about the left knee. 2. Moderate joint effusion. 3. Chronic osteochondral defect at the medial femoral condyle. 4. Moderate degenerative osteoarthrosis. Electronically Signed   By: Jeannine Boga M.D.   On: 07/01/2017 13:10   Dg Knee Complete 4 Views Right  Result Date: 07/01/2017 CLINICAL DATA:  Initial evaluation for acute trauma, fall. EXAM: RIGHT KNEE - COMPLETE 4+ VIEW COMPARISON:  Prior radiograph from 01/06/2017. FINDINGS: No acute fracture or dislocation. No appreciable joint effusion. Osseous mineralization within normal limits. Mild degenerative osteoarthritic changes with juxta-articular osteophytic spurring at the medial femorotibial joint space compartment. No acute soft tissue abnormality. IMPRESSION: 1. No acute osseous abnormality about the right knee. 2. Mild degenerative osteoarthrosis involving the medial femorotibial joint space compartment. Electronically Signed   By: Jeannine Boga M.D.   On: 07/01/2017 13:13      Subjective: Patient breathing much better ambulating in the room with no problems.  Still requiring 2 L of oxygen.  Discharge Exam: Vitals:   07/02/17 0447 07/02/17 0809  BP: 110/62   Pulse: 72   Resp: 16   Temp: 97.8 F (36.6 C)   SpO2: 92% 91%   Vitals:   07/01/17 1947 07/01/17 2158 07/02/17 0447 07/02/17 0809  BP:  129/71 110/62   Pulse:  77 72   Resp:  18 16   Temp:  98.2 F (36.8 C) 97.8 F (36.6 C)   TempSrc:  Oral Oral   SpO2: 95% 95% 92% 91%   Weight:      Height:       General: Pt is alert, awake, not in acute distress Cardiovascular: normal S1/S2 +, no rubs, no gallops Respiratory: bibasilar expiratory wheezing, no rhonchi Abdominal: Soft, NT, ND, bowel sounds + Extremities: no edema, no cyanosis   The results of significant diagnostics from this hospitalization (including imaging, microbiology, ancillary and laboratory) are listed below for reference.     Microbiology: No results found for this or any previous visit (from the past 240 hour(s)).   Labs: BNP (last 3 results) No results for input(s): BNP in the last 8760 hours. Basic Metabolic Panel:  Recent Labs Lab 07/01/17 1441 07/02/17 0442  NA 142 138  K 3.8 2.9*  CL 99* 98*  CO2 32 30  GLUCOSE 112* 140*  BUN 11 11  CREATININE 0.85 0.82  CALCIUM 8.9 8.6*  MG  --  1.7   Liver Function Tests: No results for input(s): AST, ALT, ALKPHOS, BILITOT, PROT, ALBUMIN in the last 168 hours. No results for input(s): LIPASE, AMYLASE in the last 168 hours. No results for input(s): AMMONIA in the last 168 hours. CBC:  Recent Labs Lab 07/01/17 1441 07/02/17 0442  WBC 11.8* 10.5  NEUTROABS 7.6  --   HGB 15.4* 14.4  HCT 45.0 43.5  MCV 94.5 94.0  PLT 297 289   Cardiac Enzymes: No results for input(s): CKTOTAL, CKMB, CKMBINDEX, TROPONINI in the last 168 hours. BNP: Invalid input(s): POCBNP CBG: No results for input(s): GLUCAP in the last 168 hours. D-Dimer No results for input(s): DDIMER in the last 72 hours. Hgb A1c No results for input(s): HGBA1C in the last 72 hours. Lipid Profile No results for input(s): CHOL, HDL, LDLCALC, TRIG, CHOLHDL, LDLDIRECT in the last 72 hours. Thyroid function studies No results for input(s): TSH, T4TOTAL, T3FREE, THYROIDAB in the last 72 hours.  Invalid input(s): FREET3 Anemia work up No results for input(s): VITAMINB12, FOLATE, FERRITIN, TIBC, IRON, RETICCTPCT in the last 72 hours. Urinalysis    Component Value  Date/Time   COLORURINE YELLOW 07/01/2017 1431   APPEARANCEUR HAZY (A) 07/01/2017 1431   LABSPEC 1.017 07/01/2017 1431   PHURINE 6.0 07/01/2017 1431   GLUCOSEU NEGATIVE 07/01/2017 1431   HGBUR NEGATIVE 07/01/2017 1431   BILIRUBINUR NEGATIVE 07/01/2017 1431   BILIRUBINUR negative 10/31/2015 1017   KETONESUR NEGATIVE 07/01/2017 1431   PROTEINUR NEGATIVE 07/01/2017 1431   UROBILINOGEN negative 10/31/2015 1017   UROBILINOGEN 0.2 12/28/2013 0101   NITRITE NEGATIVE 07/01/2017 1431   LEUKOCYTESUR SMALL (A) 07/01/2017 1431   Sepsis Labs Invalid input(s): PROCALCITONIN,  WBC,  LACTICIDVEN Microbiology No results found for this or any previous visit (from the past 240 hour(s)).  Time coordinating discharge: 32 mins  SIGNED:  Irwin Brakeman, MD  [facility] 07/02/2017, 9:44 AM Pager [phone]  If 7PM-7AM, please contact night-coverage [email or web address] Password [email or web address]

## 2017-07-02 NOTE — Progress Notes (Signed)
Removed pts IV. Clean, dry, and intact. Reviewed d/c paperwork with pt. Discharged but awaiting on arrival of oxygen. Dressed pt and answered all questions.

## 2017-07-02 NOTE — Discharge Instructions (Signed)
Follow with Primary MD  Health, [facility]  and other consultant's as instructed your Hospitalist MD  Please get a complete blood count and chemistry panel checked by your Primary MD at your next visit, and again as instructed by your Primary MD.  Get Medicines reviewed and adjusted: Please take all your medications with you for your next visit with your Primary MD  Laboratory/radiological data: Please request your Primary MD to go over all hospital tests and procedure/radiological results at the follow up, please ask your Primary MD to get all Hospital records sent to his/her office.  In some cases, they will be blood work, cultures and biopsy results pending at the time of your discharge. Please request that your primary care M.D. follows up on these results.  Also Note the following: If you experience worsening of your admission symptoms, develop shortness of breath, life threatening emergency, suicidal or homicidal thoughts you must seek medical attention immediately by calling 911 or calling your MD immediately  if symptoms less severe.  You must read complete instructions/literature along with all the possible adverse reactions/side effects for all the Medicines you take and that have been prescribed to you. Take any new Medicines after you have completely understood and accpet all the possible adverse reactions/side effects.   Do not drive when taking Pain medications or sleeping medications (Benzodaizepines)  Do not take more than prescribed Pain, Sleep and Anxiety Medications. It is not advisable to combine anxiety,sleep and pain medications without talking with your primary care practitioner  Special Instructions: If you have smoked or chewed Tobacco  in the last 2 yrs please stop smoking, stop any regular Alcohol  and or any Recreational drug use.  Wear Seat belts while driving.  Please note: You were cared for by a hospitalist during your hospital stay. Once you  are discharged, your primary care physician will handle any further medical issues. Please note that NO REFILLS for any discharge medications will be authorized once you are discharged, as it is imperative that you return to your primary care physician (or establish a relationship with a primary care physician if you do not have one) for your post hospital discharge needs so that they can reassess your need for medications and monitor your lab values.

## 2017-07-02 NOTE — Care Management (Signed)
    Durable Medical Equipment        Start     Ordered   07/02/17 0933  For home use only DME oxygen  Once    Question Answer Comment  Mode or (Route) Nasal cannula   Liters per Minute 2   Frequency Continuous (stationary and portable oxygen unit needed)   Oxygen conserving device Yes   Oxygen delivery system Gas      07/02/17 0932   07/02/17 0933  For home use only DME Nebulizer machine  Once    Question Answer Comment  Patient needs a nebulizer to treat with the following condition Chronic respiratory failure ([place])   Patient needs a nebulizer to treat with the following condition COPD (chronic obstructive pulmonary disease) ([place])   Patient needs a nebulizer to treat with the following condition COPD (chronic obstructive pulmonary disease) with acute bronchitis ([place])      07/02/17 0932     Pt will require supplemental oxygen due to alternative methods (duo neb treatments) being tried and found to be insufficient.

## 2017-07-02 NOTE — Care Management (Signed)
Patient Information   Patient Name Tiffany Lynch, Tiffany Lynch ([id number]) Sex Female DOB [birth year]  Room Bed  A337 A337-01  Patient Demographics   Address [place] [address] Phone [phone] (Home) *Preferred* [phone] (Work) [phone] (Mobile)  Patient Ethnicity & Race   Ethnic Group Patient Race  Not Hispanic or Latino White or Caucasian  Emergency Contact(s)   Name Tiffany Lynch Mother [phone]  [phone]  Tiffany Lynch [phone]    Documents on File    Status Date Received Description  Documents for the Patient  [facility] Release of Information Not Received    Release of Information Not Received    Release of Information Not Received    [place] Received 08/07/11   [place] E-Signature HIPAA Notice of Privacy Received 09/18/11   [place] E-Signature HIPAA Notice of Privacy Spanish Not Received    Driver's License Not Received    Advance Directives/Living Will/HCPOA/POA Not Received    [place] Not Received    Historic Radiology Documentation Not Received    [place] Not Received    Release of Information Not Received    Insurance Card Received 08/07/11 MCD/WRFM  Financial Application Not Received    HIM ROI Authorization Not Received    Release of Information Not Received    Insurance Card Not Received    [place] Not Received    Conversion ED [place] Not Received    Conversion ED [place] Not Received    HIM ROI Authorization Not Received  01/27/13 - Inpatient stay of April 2014, all notes, labs, x-rays, tests.  Release of Information Not Received    Advanced Beneficiary Notice (ABN) Not Received    AMB Correspondence  03/18/14 PATIENT CARE AGREEMENT/POC [facility]  Driver's License   WRFM  E-Signature AOB Spanish Not Received    AMB  Correspondence  08/11/14 APPROVAL STATUS INQUIRY [place] TRACKS PROVIDER PORTAL  Other Photo ID Not Received    AMB Correspondence  01/24/15 OFFICE NOTES [facility]  AMB Correspondence  04/27/15 OFFICE NOTE [facility]  Insurance Card Received 10/18/15   Insurance Card Received 10/31/15 WRFM/MCD  AMB Correspondence  10/18/15 OFFICE NOTE [facility]  HIM ROI Authorization  02/01/16   Release of Information  02/08/16   HIM ROI Authorization (Expired) 05/23/16 [place] Authorization  17/61/60 [place]  Release of Information  05/30/16   AMB Correspondence  06/27/16 APPROVED/PRE AUTHO CONFIRMATION FAX [facility]  AMB Correspondence  06/27/16 APPROVED CASE REQUEST RESULTS [facility] Card   [place] HIPAA NOTICE OF PRIVACY - Scanned   315  REC 07-10-16  AMB Provider Completed Forms  10/15/16 MEDICAL CLEARANCE/MEDICATIONS POWELL NP,A  HIM ROI Authorization  10/30/16   HIM ROI Authorization  10/30/16 [place]  AMB HH/NH/Hospice  10/30/16 MEDICATION ISSUE COMMNICATION/ORDERS [facility]  AMB HH/NH/Hospice  11/18/16 PHYSICIAN COMMUNICATION/INTERIM ORDER [facility]  AMB HH[facility]NH/Hospice  12/02/16 03/18 MISSED VISIT NOTE [facility] [place]  AMB HH/NH/Hospice  10/28/16 POC [facility] [place]  AMB Correspondence  01/16/17 PRE-AUTHORIZATION CONFIRMATION [facility]  AMB HH/NH/Hospice  11/22/16 MISSED VISIT NOTE [facility]  Insurance Card Received 03/01/17 315 INS/ID  AMB Outside ED Note  01/06/17 [facility]  HEALTH CARE  AMB Correspondence  03/31/17 [place]  AMB HH/NH/Hospice (Deleted) 12/02/16 03/18 MISSED VISIT NOTE [facility]  Documents for the Encounter  AOB (Assignment of Insurance Benefits) Not Received    E-signature AOB Signed 07/01/17   MEDICARE RIGHTS Not Received    E-signature Medicare Rights      Discharge Attachment   Chronic Bronchitis (English)  Discharge Attachment   Chronic Obstructive Pulmonary Disease Exacerbation Easy-to-Read (English)  Discharge Attachment   Cough Adult Easy-to-Read (English)  Discharge Attachment   Chronic Respiratory Failure (English)  Discharge Attachment   Home Oxygen Use Adult (English)  Discharge Attachment   Health Risks of Smoking (English)  Discharge Attachment   Steps to Quit Smoking Easy-to-Read (English)  Discharge Attachment   What You Need to Know About Electronic Cigarettes (English)  Discharge Attachment   Tobacco Use Disorder (English)  Discharge Attachment   Coping with Quitting Smoking (English)  Admission Information   Attending Provider Admitting Provider Admission Type Admission Date/Time  Murlean Iba, MD Karmen Bongo, MD Emergency 07/01/17 1050  Discharge Date Hospital Service Auth/Cert Status Service Area   Internal Medicine Incomplete [place]  Unit Room/Bed Admission Status   AP-DEPT 300 A337/A337-01 Admission (Confirmed)   Admission   Complaint  ---  Hospital Account   Name Acct ID Class Status Primary Coverage  Tiffany Lynch, Tiffany Lynch [id number] [place] Open MEDICAID [place] - MEDICAID [place] ACCESS      Guarantor Account (for [place] [id number])   Name Relation to Pt Service Area Active? Acct Type  Tiffany Lynch Self [facility] Yes Personal/Family  Address Phone    [place], [place] [address] [phone])        Coverage Information (for Hospital Account [id number])   F/O Payor/Plan Precert #  MEDICAID [place]/MEDICAID [place] ACCESS   Subscriber Subscriber #  Tiffany Lynch, Tiffany Lynch [id number] [facility]  Address Phone  [address] [place], [place] [address] [phone]

## 2017-07-02 NOTE — Progress Notes (Signed)
Pt instructed on proper IS use. Pt encouraged to use pillow for splinting. Pt states that she understands instructions

## 2017-07-02 NOTE — Care Management Note (Addendum)
Case Management Note  Patient Details  Name: Tiffany Lynch MRN: [id number] Date of Birth: 12-17-1960  Subjective/Objective:          Admitted after becoming hypoxic in ED being treated for broken ribs. Pt from home, ind with ADL's. She was going to [place] for care. She has medicaid with drug coverage. She uses [employer] for transportation to appointments. She does not meet criteria for supplemental oxygen. She needs neb machine for treatments. She has no preference of DME provider. She requests help finding PCP.  Pt asks about Darlyne Russian, MD and is also open to [facility] in [place]. Pt communicates no further needs/concerns about DC plan.         Action/Plan: DC home today with self care. Vaughan Basta, [facility] rep, given referral and will deliver neb machine to room prior to DC. Pt made appointment with [facility], Dr. Criss Rosales is in [place] and pt would struggle with transportation to those appointments. Pt demographics, H&P, DC summary, labs and imaging faxed to [facility] for continuity of care.  Pt had f/u appointment made with [facility] health department, CM cancelled this appointment.Pt encouraged to breath deeply, ambulate as much as possible and use IS so that she does not develop pneumonia.   Expected Discharge Date:  07/02/17               Expected Discharge Plan:  Home/Self Care  In-House Referral:  NA  Discharge planning Services  CM Consult, Follow-up appt scheduled, [place] Acute Care Choice:  Durable Medical Equipment Choice offered to:  Patient  DME Arranged:  Nebulizer machine, oxygen DME Agency:  [place].  Status of Service:  Completed, signed off   Addendum: pt now meeting requirements for supplemental oxygen. Vaughan Basta, [facility] rep, aware of need and will deliver DME to pt room.   Sherald Barge, RN 07/02/2017, 11:14 AM

## 2017-10-27 ENCOUNTER — Emergency Department (HOSPITAL_COMMUNITY)
Admission: EM | Admit: 2017-10-27 | Discharge: 2017-10-27 | Disposition: A | Discharge status: Home / Self Care | Payer: Medicaid Other | Attending: Emergency Medicine | Admitting: Emergency Medicine

## 2017-10-27 ENCOUNTER — Emergency Department (HOSPITAL_COMMUNITY): Payer: Medicaid Other

## 2017-10-27 ENCOUNTER — Encounter (HOSPITAL_COMMUNITY): Payer: Self-pay | Admitting: *Deleted

## 2017-10-27 DIAGNOSIS — I10 Essential (primary) hypertension: Secondary | ICD-10-CM | POA: Insufficient documentation

## 2017-10-27 DIAGNOSIS — Z9981 Dependence on supplemental oxygen: Secondary | ICD-10-CM | POA: Insufficient documentation

## 2017-10-27 DIAGNOSIS — Z79899 Other long term (current) drug therapy: Secondary | ICD-10-CM | POA: Diagnosis not present

## 2017-10-27 DIAGNOSIS — J410 Simple chronic bronchitis: Secondary | ICD-10-CM | POA: Diagnosis not present

## 2017-10-27 DIAGNOSIS — F17210 Nicotine dependence, cigarettes, uncomplicated: Secondary | ICD-10-CM | POA: Insufficient documentation

## 2017-10-27 DIAGNOSIS — R05 Cough: Secondary | ICD-10-CM | POA: Diagnosis present

## 2017-10-27 MED ORDER — ALBUTEROL SULFATE (2.5 MG/3ML) 0.083% IN NEBU
2.5000 mg | INHALATION_SOLUTION | Freq: Once | RESPIRATORY_TRACT | Status: AC
Start: 2017-10-27 — End: 2017-10-27
  Administered 2017-10-27: 2.5 mg via RESPIRATORY_TRACT
  Filled 2017-10-27: qty 3

## 2017-10-27 MED ORDER — PREDNISONE 50 MG PO TABS: ORAL_TABLET | ORAL | 0 refills | Status: DC

## 2017-10-27 MED ORDER — IPRATROPIUM-ALBUTEROL 0.5-2.5 (3) MG/3ML IN SOLN
3.0000 mL | Freq: Once | RESPIRATORY_TRACT | Status: AC
Start: 2017-10-27 — End: 2017-10-27
  Administered 2017-10-27: 3 mL via RESPIRATORY_TRACT
  Filled 2017-10-27: qty 3

## 2017-10-27 MED ORDER — PREDNISONE 10 MG PO TABS
60.0000 mg | ORAL_TABLET | Freq: Once | ORAL | Status: AC
  Administered 2017-10-27: 60 mg via ORAL
  Filled 2017-10-27: qty 1

## 2017-10-27 NOTE — ED Provider Notes (Signed)
[facility] EMERGENCY DEPARTMENT Provider Note   CSN: [id number] Arrival date & time: 10/27/17  1047     History   Chief Complaint Chief Complaint  Patient presents with  . Cough    HPI Tiffany Lynch is a 57 y.o. female with a one month history of uri type symptoms which includes nasal congestion with clear rhinorrhea, cough which has been mostly nonproductive but with occasional yellow sputum production and episodic wheezing.  She denies fevers, sore throat, sinus pain, headache.  She reports having diarrhea 3 days ago but this resolved without intervention.  Symptoms do include shortness of breath which is worsened at night. She denies chest pain,  Nausea, vomiting.  The patient has taken otc generic cold medicines most recently last night with no significant improvement in symptoms. She is using her albuterol nebulizer with some improvement, but has not used x 3 days. .  The history is provided by the patient.    Past Medical History:  Diagnosis Date  . Anxiety   . Bipolar 1 disorder ([place])   . Chronic back pain   . Chronic knee pain   . COPD (chronic obstructive pulmonary disease) ([place])   . Depression   . GERD (gastroesophageal reflux disease)   . History of anemia   . Hypertension   . Lumbar radiculopathy   . On home oxygen therapy "6 years ago"   3LPM PRN: "had this 6 years ago," pt no longer has O2 at home (07/01/2017)  . Sleep apnea     Patient Active Problem List   Diagnosis Date Noted  . Rib fractures 07/01/2017  . Tobacco dependence 07/01/2017  . Derangement of posterior horn of medial meniscus of left knee   . Loose body of left knee   . Hypotension 08/14/2016  . Respiratory failure with hypoxia ([place]) 08/14/2016  . Hyperglycemia 08/14/2016  . Alcohol abuse 08/14/2016  . Cocaine abuse ([place]) 08/14/2016  . Chronic pain 08/14/2016  . Oral thrush 08/14/2016  . Hypokalemia 05/31/2016  . Pre-syncope 05/31/2016  . Current smoker 07/20/2015  . Urge and  stress incontinence 07/14/2015  . Hyperlipidemia 01/02/2015  . Insomnia 01/02/2015  . OAB (overactive bladder) 01/02/2015  . Depression 01/02/2015  . GERD (gastroesophageal reflux disease) 01/02/2015  . ARF (acute renal failure) ([place]) 12/21/2013  . COPD (chronic obstructive pulmonary disease) ([place]) 05/22/2012  . Dysfunctional uterine bleeding 04/01/2012  . Orthostatic hypotension 03/31/2012  . Encephalopathy acute 03/30/2012  . Chronic back pain   . Bipolar 1 disorder ([place])   . Anxiety     Past Surgical History:  Procedure Laterality Date  . COLONOSCOPY WITH PROPOFOL N/A 07/18/2014   Procedure: COLONOSCOPY WITH PROPOFOL;  Surgeon: Rogene Houston, MD;  Location: [facility];  Service: Endoscopy;  Laterality: N/A;  in cecum at 0753, withdrawal time 20 min  . DILATION AND CURETTAGE OF UTERUS  age 40  . KNEE ARTHROSCOPY WITH MEDIAL MENISECTOMY Left 10/10/2016   Procedure: KNEE ARTHROSCOPY WITH MEDIAL MENISECTOMY REMOVAL LOSE BODY;  Surgeon: Carole Civil, MD;  Location: [facility];  Service: Orthopedics;  Laterality: Left;  . POLYPECTOMY  05/22/2012   Procedure: POLYPECTOMY;  Surgeon: Jonnie Kind, MD;  Location: [facility];  Service: Gynecology;  Laterality: N/A;  Endometrial Polypectomy  . POLYPECTOMY N/A 07/18/2014   Procedure: POLYPECTOMY;  Surgeon: Rogene Houston, MD;  Location: [facility];  Service: Endoscopy;  Laterality: N/A;  . TUBAL LIGATION      OB History    Gravida Para  Term Preterm AB Living   2 2 2     2    SAB TAB Ectopic Multiple Live Births                   Home Medications    Prior to Admission medications   Medication Sig Start Date End Date Taking? Authorizing Provider  ALPRAZolam Duanne Moron) 0.5 MG tablet Take 0.5 mg by mouth 4 (four) times daily. 10/05/17   [provider]  amLODipine (NORVASC) 5 MG tablet Take 5 mg by mouth daily. 06/16/17   [provider]  buPROPion (WELLBUTRIN XL) 300 MG 24 hr tablet Take 300 mg by mouth every morning.    [provider]  busPIRone (BUSPAR) 30 MG tablet Take 0.5 tablets by mouth 3 (three) times daily. 10/03/17   [provider]  cyclobenzaprine (FLEXERIL) 10 MG tablet Take 10 mg by mouth 3 (three) times daily as needed for muscle spasms.  09/10/16   [provider]  docusate sodium (COLACE) 100 MG capsule Take 1 capsule (100 mg total) by mouth 2 (two) times daily. 07/02/17   Johnson, Clanford L, MD  DULoxetine (CYMBALTA) 60 MG capsule Take 60 mg by mouth every morning.     [provider]  ibuprofen (ADVIL,MOTRIN) 800 MG tablet Take 800 mg by mouth 3 (three) times daily. 10/06/17   [provider]  ipratropium-albuterol (DUONEB) 0.5-2.5 (3) MG/3ML SOLN Take 3 mLs by nebulization every 6 (six) hours as needed. 07/02/17   Johnson, Clanford L, MD  lidocaine (LIDODERM) 5 % Place 1 patch onto the skin daily. Remove & Discard patch within 12 hours or as directed by MD 07/02/17   Murlean Iba, MD  loratadine (CLARITIN) 10 MG tablet Take 10 mg by mouth daily. 04/10/17   [provider]  meloxicam (MOBIC) 7.5 MG tablet Take 7.5 mg by mouth daily. 10/08/17   [provider]  metoprolol tartrate (LOPRESSOR) 25 MG tablet Take 25 mg by mouth 2 (two) times daily. 06/16/17   [provider]  omeprazole (PRILOSEC) 20 MG capsule Take 20 mg by mouth daily. 07/29/16   [provider]  oxybutynin (DITROPAN) 5 MG tablet Take 5 mg by mouth 2 (two) times daily. 10/22/17   [provider]  predniSONE (DELTASONE) 50 MG tablet Take one tablet daily for 4 days 10/28/17   Evalee Jefferson, PA-C  risperiDONE (RISPERDAL) 3 MG tablet Take 3 mg by mouth at bedtime. 05/23/16   [provider]  traMADol (ULTRAM) 50 MG tablet Take 1 tablet (50 mg total) by mouth every 6 (six) hours as needed for severe pain. 07/02/17   Johnson, Clanford L, MD  traZODone (DESYREL) 100 MG tablet Take 300 mg by mouth at bedtime. 07/01/16   [provider]  VOLTAREN 1  % GEL Apply 1 g topically 4 (four) times daily as needed for pain. 09/16/16   [provider]  paliperidone (INVEGA) 6 MG 24 hr tablet Take 6 mg by mouth daily.    11/29/11  [provider]    Family History Family History  Problem Relation Age of Onset  . Hypertension Mother   . Hypertension Brother   . Anxiety disorder Brother   . Depression Brother     Social History Social History   Tobacco Use  . Smoking status: Current Every Day Smoker    Packs/day: 1.00    Years: 41.00    Pack years: 41.00    Types: Cigarettes  . Smokeless  tobacco: Never Used  Substance Use Topics  . Alcohol use: Yes    Alcohol/week: 1.2 - 1.8 oz    Types: 2 - 3 Glasses of wine per week    Comment: drinks wine several times a week  . Drug use: No    Comment: history of cocaine and marijuana use, reports no use in about 6 months     Allergies   Patient has no known allergies.   Review of Systems Review of Systems  Constitutional: Negative for chills and fever.  HENT: Positive for congestion and rhinorrhea. Negative for ear pain, sinus pressure, sore throat, trouble swallowing and voice change.   Eyes: Negative for discharge.  Respiratory: Positive for cough, shortness of breath and wheezing. Negative for stridor.   Cardiovascular: Negative for chest pain, palpitations and leg swelling.  Gastrointestinal: Negative for abdominal pain.  Genitourinary: Negative.      Physical Exam Updated Vital Signs BP (!) 142/100 (BP Location: Right Arm)   Pulse (!) 102   Temp 98.6 F (37 C) (Oral)   Resp (!) 22   Ht 5\' 3"  (1.6 m)   Wt 93 kg (205 lb)   LMP 05/06/2012   SpO2 94%   BMI 36.31 kg/m   Physical Exam  Constitutional: She is oriented to person, place, and time. She appears well-developed and well-nourished.  HENT:  Head: Normocephalic and atraumatic.  Right Ear: Tympanic membrane and ear canal normal.  Left Ear: Tympanic membrane and ear canal normal.  Nose: Mucosal  edema and rhinorrhea present.  Mouth/Throat: Uvula is midline, oropharynx is clear and moist and mucous membranes are normal. No oropharyngeal exudate, posterior oropharyngeal edema, posterior oropharyngeal erythema or tonsillar abscesses.  Eyes: Conjunctivae are normal.  Cardiovascular: Normal rate and normal heart sounds. Exam reveals no friction rub.  No murmur heard. Pulmonary/Chest: Effort normal. No accessory muscle usage. No respiratory distress. She has wheezes. She has no rales.  Expiratory wheeze throughout all lung fields.  Prolonged expirations.  Abdominal: Soft. There is no tenderness.  Musculoskeletal: Normal range of motion.  Neurological: She is alert and oriented to person, place, and time.  Skin: Skin is warm and dry. No rash noted.  Psychiatric: She has a normal mood and affect.     ED Treatments / Results  Labs (all labs ordered are listed, but only abnormal results are displayed) Labs Reviewed - No data to display  EKG  EKG Interpretation None       Radiology Dg Chest 2 View  Result Date: 10/27/2017 CLINICAL DATA:  Cough and congestion with fever EXAM: CHEST  2 VIEW COMPARISON:  July 01, 2017 FINDINGS: Lungs are clear. Heart size and pulmonary vascularity are normal. No adenopathy. There is degenerative change in the thoracic spine. IMPRESSION: No edema or consolidation. Electronically Signed   By: Lowella Grip III M.D.   On: 10/27/2017 11:17    Procedures Procedures (including critical care time)  Medications Ordered in ED Medications  predniSONE (DELTASONE) tablet 60 mg (60 mg Oral Given 10/27/17 1307)  ipratropium-albuterol (DUONEB) 0.5-2.5 (3) MG/3ML nebulizer solution 3 mL (3 mLs Nebulization Given 10/27/17 1331)  albuterol (PROVENTIL) (2.5 MG/3ML) 0.083% nebulizer solution 2.5 mg (2.5 mg Nebulization Given 10/27/17 1331)     Initial Impression / Assessment and Plan / ED Course  I have reviewed the triage vital signs and the nursing  notes.  Pertinent labs & imaging results that were available during my care of the patient were reviewed by me and considered in my  medical decision making (see chart for details).     Patient was given an albuterol and Atrovent nebulizer treatment and after this treatment she was moving much better air, respirations slightly rhonchorous, wheezing resolved.  She was given prednisone dose here, will plan for pulse dosing prednisone for the next 4 days.  She has plenty of her albuterol at home for her nebulizer machine and was encouraged to continue taking this medicine every 4 hours as needed for cough or wheezing.  Advised recheck by her PCP, returning here for any worsening symptoms. Final Clinical Impressions(s) / ED Diagnoses   Final diagnoses:  Simple chronic bronchitis [facility])    ED Discharge Orders        Ordered    predniSONE (DELTASONE) 50 MG tablet     10/27/17 1350       Evalee Jefferson, PA-C 10/27/17 1358    Mesner, Corene Cornea, MD 10/28/17 [id number]

## 2017-10-27 NOTE — ED Triage Notes (Signed)
Cough and congestion x 4 weeks per pt, productive at times.  Diarrhea 3 days ago but denies at present.  ? Fever

## 2017-10-27 NOTE — Discharge Instructions (Signed)
Take your next dose of the prednisone tomorrow.  Take your breathing treatments every 4 hours if you are coughing, wheezing or having shortness of breath.

## 2017-11-11 ENCOUNTER — Telehealth: Payer: Self-pay | Admitting: Radiology

## 2017-11-11 ENCOUNTER — Other Ambulatory Visit: Payer: Self-pay | Admitting: Orthopedic Surgery

## 2017-11-11 MED ORDER — CYCLOBENZAPRINE HCL 10 MG PO TABS: 10.0000 mg | ORAL_TABLET | Freq: Three times a day (TID) | ORAL | 3 refills | Status: DC | PRN

## 2017-11-11 NOTE — Telephone Encounter (Signed)
Refill request received via fax for Cyclobenzoprene #60 one bid but I do not see you have ever prescribed this for her

## 2017-11-13 ENCOUNTER — Telehealth: Payer: Self-pay | Admitting: Orthopedic Surgery

## 2017-11-13 ENCOUNTER — Telehealth: Payer: Self-pay | Admitting: Radiology

## 2017-11-13 NOTE — Telephone Encounter (Signed)
Patient has had records sent from [facility] about her shoulder, Dr Aline Brochure has advised them to send her to physical therapy for the shoulder  Patient is asking if Dr Aline Brochure can order the physical therapy. I have advised her he cannot since he has not treated her for the shoulder, she should call the doctor who has seen her for the shoulder to get the physical therapy ordered.  Patient voiced understanding.

## 2018-02-19 ENCOUNTER — Other Ambulatory Visit: Payer: Self-pay | Admitting: Orthopedic Surgery

## 2018-03-22 ENCOUNTER — Emergency Department (HOSPITAL_COMMUNITY)
Admission: EM | Admit: 2018-03-22 | Discharge: 2018-03-22 | Disposition: A | Discharge status: Home / Self Care | Payer: Medicaid Other | Attending: Emergency Medicine | Admitting: Emergency Medicine

## 2018-03-22 ENCOUNTER — Encounter (HOSPITAL_COMMUNITY): Payer: Self-pay | Admitting: *Deleted

## 2018-03-22 ENCOUNTER — Emergency Department (HOSPITAL_COMMUNITY): Payer: Medicaid Other

## 2018-03-22 ENCOUNTER — Other Ambulatory Visit: Payer: Self-pay

## 2018-03-22 DIAGNOSIS — F17210 Nicotine dependence, cigarettes, uncomplicated: Secondary | ICD-10-CM | POA: Insufficient documentation

## 2018-03-22 DIAGNOSIS — M5441 Lumbago with sciatica, right side: Secondary | ICD-10-CM | POA: Diagnosis not present

## 2018-03-22 DIAGNOSIS — M25562 Pain in left knee: Secondary | ICD-10-CM

## 2018-03-22 DIAGNOSIS — I10 Essential (primary) hypertension: Secondary | ICD-10-CM | POA: Insufficient documentation

## 2018-03-22 DIAGNOSIS — M1990 Unspecified osteoarthritis, unspecified site: Secondary | ICD-10-CM

## 2018-03-22 DIAGNOSIS — W19XXXA Unspecified fall, initial encounter: Secondary | ICD-10-CM

## 2018-03-22 DIAGNOSIS — M7501 Adhesive capsulitis of right shoulder: Secondary | ICD-10-CM | POA: Insufficient documentation

## 2018-03-22 DIAGNOSIS — M7502 Adhesive capsulitis of left shoulder: Secondary | ICD-10-CM | POA: Insufficient documentation

## 2018-03-22 DIAGNOSIS — Z79899 Other long term (current) drug therapy: Secondary | ICD-10-CM | POA: Diagnosis not present

## 2018-03-22 DIAGNOSIS — J449 Chronic obstructive pulmonary disease, unspecified: Secondary | ICD-10-CM | POA: Insufficient documentation

## 2018-03-22 DIAGNOSIS — W07XXXA Fall from chair, initial encounter: Secondary | ICD-10-CM | POA: Diagnosis not present

## 2018-03-22 DIAGNOSIS — M545 Low back pain: Secondary | ICD-10-CM | POA: Diagnosis present

## 2018-03-22 MED ORDER — TRAMADOL HCL 50 MG PO TABS: 50.0000 mg | ORAL_TABLET | Freq: Two times a day (BID) | ORAL | 0 refills | Status: AC

## 2018-03-22 MED ORDER — TRAMADOL HCL 50 MG PO TABS
100.0000 mg | ORAL_TABLET | Freq: Once | ORAL | Status: AC
  Administered 2018-03-22: 100 mg via ORAL
  Filled 2018-03-22: qty 2

## 2018-03-22 NOTE — ED Notes (Signed)
Pt left with mother driving.

## 2018-03-22 NOTE — ED Triage Notes (Addendum)
Pt states that she fell a few weeks ago on some steps, tried to catch herself with a chair and the chair fell on her as well, pt c/o pain to bilateral shoulders and her tailbone area that radiates down right leg as well,

## 2018-03-22 NOTE — Discharge Instructions (Addendum)
Take Ultram as prescribed for pain. Follow up with your doctor or see your orthopedist for further treatment and evaluation.

## 2018-03-22 NOTE — ED Provider Notes (Signed)
[facility] EMERGENCY DEPARTMENT Provider Note   CSN: [id number] Arrival date & time: 03/22/18  1910     History   Chief Complaint Chief Complaint  Patient presents with  . Fall    HPI Tiffany Lynch is a 57 y.o. female.  57 year old female presents with complaint of injuries from a fall.  Patient states that she fell 2 to 3 weeks ago from a standing position landing on outstretched arms, resulting in pain in both of her shoulders.  Pain is worse with movement of the shoulders.  No loss of consciousness, did not feel weak or dizzy prior to the fall, states that she tripped and fell.  Patient states a few days ago she was sitting on a chair when the chair broke, landing on her buttocks and now has pain in her tailbone which radiates down her right leg.  Pain is worse with sitting. Also notes pain in her left knee from the second fall, known severe arthritis in the knee. Denies leg weakness, groin numbness, loss of bowel or bladder control. No other injuries, complaints, concerns.      Past Medical History:  Diagnosis Date  . Anxiety   . Bipolar 1 disorder ([place])   . Chronic back pain   . Chronic knee pain   . COPD (chronic obstructive pulmonary disease) ([place])   . Depression   . GERD (gastroesophageal reflux disease)   . History of anemia   . Hypertension   . Lumbar radiculopathy   . On home oxygen therapy "6 years ago"   3LPM PRN: "had this 6 years ago," pt no longer has O2 at home (07/01/2017)  . Sleep apnea     Patient Active Problem List   Diagnosis Date Noted  . Rib fractures 07/01/2017  . Tobacco dependence 07/01/2017  . Derangement of posterior horn of medial meniscus of left knee   . Loose body of left knee   . Hypotension 08/14/2016  . Respiratory failure with hypoxia ([place]) 08/14/2016  . Hyperglycemia 08/14/2016  . Alcohol abuse 08/14/2016  . Cocaine abuse ([place]) 08/14/2016  . Chronic pain 08/14/2016  . Oral thrush 08/14/2016  . Hypokalemia 05/31/2016  .  Pre-syncope 05/31/2016  . Current smoker 07/20/2015  . Urge and stress incontinence 07/14/2015  . Hyperlipidemia 01/02/2015  . Insomnia 01/02/2015  . OAB (overactive bladder) 01/02/2015  . Depression 01/02/2015  . GERD (gastroesophageal reflux disease) 01/02/2015  . ARF (acute renal failure) ([place]) 12/21/2013  . COPD (chronic obstructive pulmonary disease) ([place]) 05/22/2012  . Dysfunctional uterine bleeding 04/01/2012  . Orthostatic hypotension 03/31/2012  . Encephalopathy acute 03/30/2012  . Chronic back pain   . Bipolar 1 disorder ([place])   . Anxiety     Past Surgical History:  Procedure Laterality Date  . COLONOSCOPY WITH PROPOFOL N/A 07/18/2014   Procedure: COLONOSCOPY WITH PROPOFOL;  Surgeon: Rogene Houston, MD;  Location: [facility];  Service: Endoscopy;  Laterality: N/A;  in cecum at 0753, withdrawal time 20 min  . DILATION AND CURETTAGE OF UTERUS  age 18  . KNEE ARTHROSCOPY WITH MEDIAL MENISECTOMY Left 10/10/2016   Procedure: KNEE ARTHROSCOPY WITH MEDIAL MENISECTOMY REMOVAL LOSE BODY;  Surgeon: Carole Civil, MD;  Location: [facility];  Service: Orthopedics;  Laterality: Left;  . POLYPECTOMY  05/22/2012   Procedure: POLYPECTOMY;  Surgeon: Jonnie Kind, MD;  Location: [facility];  Service: Gynecology;  Laterality: N/A;  Endometrial Polypectomy  . POLYPECTOMY N/A 07/18/2014   Procedure: POLYPECTOMY;  Surgeon: Rogene Houston,  MD;  Location: [facility];  Service: Endoscopy;  Laterality: N/A;  . TUBAL LIGATION       OB History    Gravida  2   Para  2   Term  2   Preterm      AB      Living  2     SAB      TAB      Ectopic      Multiple      Live Births               Home Medications    Prior to Admission medications   Medication Sig Start Date End Date Taking? Authorizing Provider  ALPRAZolam Duanne Moron) 0.5 MG tablet Take 0.5 mg by mouth 4 (four) times daily. 10/05/17   [provider]  amLODipine (NORVASC) 5 MG tablet Take 5 mg by mouth daily. 06/16/17    [provider]  buPROPion (WELLBUTRIN XL) 300 MG 24 hr tablet Take 300 mg by mouth every morning.    [provider]  busPIRone (BUSPAR) 30 MG tablet Take 0.5 tablets by mouth 3 (three) times daily. 10/03/17   [provider]  cyclobenzaprine (FLEXERIL) 10 MG tablet TAKE 1 TABLET BY MOUTH THREE TIMES A DAY AS NEEDED FOR MUSCLE SPASMS 02/19/18   Carole Civil, MD  docusate sodium (COLACE) 100 MG capsule Take 1 capsule (100 mg total) by mouth 2 (two) times daily. 07/02/17   Johnson, Clanford L, MD  DULoxetine (CYMBALTA) 60 MG capsule Take 60 mg by mouth every morning.     [provider]  ibuprofen (ADVIL,MOTRIN) 800 MG tablet Take 800 mg by mouth 3 (three) times daily. 10/06/17   [provider]  ipratropium-albuterol (DUONEB) 0.5-2.5 (3) MG/3ML SOLN Take 3 mLs by nebulization every 6 (six) hours as needed. 07/02/17   Johnson, Clanford L, MD  lidocaine (LIDODERM) 5 % Place 1 patch onto the skin daily. Remove & Discard patch within 12 hours or as directed by MD 07/02/17   Murlean Iba, MD  loratadine (CLARITIN) 10 MG tablet Take 10 mg by mouth daily. 04/10/17   [provider]  meloxicam (MOBIC) 7.5 MG tablet Take 7.5 mg by mouth daily. 10/08/17   [provider]  metoprolol tartrate (LOPRESSOR) 25 MG tablet Take 25 mg by mouth 2 (two) times daily. 06/16/17   [provider]  omeprazole (PRILOSEC) 20 MG capsule Take 20 mg by mouth daily. 07/29/16   [provider]  oxybutynin (DITROPAN) 5 MG tablet Take 5 mg by mouth 2 (two) times daily. 10/22/17   [provider]  predniSONE (DELTASONE) 50 MG tablet Take one tablet daily for 4 days 10/28/17   Evalee Jefferson, PA-C  risperiDONE (RISPERDAL) 3 MG tablet Take 3 mg by mouth at bedtime. 05/23/16   [provider]  traMADol (ULTRAM) 50 MG tablet Take 1 tablet (50 mg total) by mouth 2 (two) times daily for 5 days. 03/22/18 03/27/18  Tacy Learn, PA-C    traZODone (DESYREL) 100 MG tablet Take 300 mg by mouth at bedtime. 07/01/16   [provider]  VOLTAREN 1 % GEL Apply 1 g topically 4 (four) times daily as needed for pain. 09/16/16   [provider]  paliperidone (INVEGA) 6 MG 24 hr tablet Take 6 mg by mouth daily.    11/29/11  [provider]    Family History Family History  Problem Relation Age of Onset  . Hypertension Mother   .  Hypertension Brother   . Anxiety disorder Brother   . Depression Brother     Social History Social History   Tobacco Use  . Smoking status: Current Every Day Smoker    Packs/day: 1.00    Years: 41.00    Pack years: 41.00    Types: Cigarettes  . Smokeless tobacco: Never Used  Substance Use Topics  . Alcohol use: Yes    Alcohol/week: 1.2 - 1.8 oz    Types: 2 - 3 Glasses of wine per week    Comment: drinks wine several times a week  . Drug use: No    Types: Marijuana    Comment: history of cocaine and marijuana use, reports no use in about 6 months     Allergies   Patient has no known allergies.   Review of Systems Review of Systems  Constitutional: Negative for fever.  Musculoskeletal: Positive for arthralgias, back pain, gait problem and myalgias. Negative for joint swelling and neck pain.  Skin: Negative for color change, rash and wound.  Allergic/Immunologic: Negative for immunocompromised state.  Neurological: Negative for dizziness and weakness.  Hematological: Does not bruise/bleed easily.  Psychiatric/Behavioral: Negative for confusion.  All other systems reviewed and are negative.    Physical Exam Updated Vital Signs BP 136/87   Pulse 74   Temp 98.2 F (36.8 C) (Oral)   Resp 20   Ht 5\' 3"  (1.6 m)   Wt 90.7 kg (200 lb)   LMP 05/06/2012   SpO2 95%   BMI 35.43 kg/m   Physical Exam  Constitutional: She is oriented to person, place, and time. She appears well-developed and well-nourished. No distress.  HENT:  Head: Normocephalic and  atraumatic.  Cardiovascular: Intact distal pulses.  Pulmonary/Chest: Effort normal.  Musculoskeletal: She exhibits tenderness. She exhibits no deformity.       Right shoulder: She exhibits decreased range of motion and tenderness. She exhibits no bony tenderness, no swelling, no effusion, no crepitus, no deformity and normal pulse.       Left shoulder: She exhibits decreased range of motion and tenderness. She exhibits no bony tenderness, no swelling, no effusion, no crepitus, no deformity, no laceration and normal pulse.       Left knee: She exhibits decreased range of motion. She exhibits no swelling, no effusion, no ecchymosis, no deformity and no laceration. Tenderness found. Medial joint line and lateral joint line tenderness noted.       Cervical back: Normal.       Lumbar back: She exhibits tenderness.       Back:  Neurological: She is alert and oriented to person, place, and time. She has normal strength. Gait normal.  Reflex Scores:      Patellar reflexes are 1+ on the right side and 1+ on the left side. Skin: Skin is warm and dry. No rash noted. She is not diaphoretic.  Psychiatric: She has a normal mood and affect. Her behavior is normal.  Nursing note and vitals reviewed.    ED Treatments / Results  Labs (all labs ordered are listed, but only abnormal results are displayed) Labs Reviewed - No data to display  EKG None  Radiology Dg Sacrum/coccyx  Result Date: 03/22/2018 CLINICAL DATA:  Per ED notes: Pt states that she fell a few weeks ago on some steps, tried to catch herself with a chair and the chair fell on her as well, pt c/o pain to bilateral shoulders and her tailbone area that radiates down right leg as  well EXAM: SACRUM AND COCCYX - 2+ VIEW COMPARISON:  None. FINDINGS: There is no evidence of fracture or other focal bone lesions. IMPRESSION: Negative. Electronically Signed   By: Lajean Manes M.D.   On: 03/22/2018 21:25   Dg Shoulder Right  Result Date:  03/22/2018 CLINICAL DATA:  Initial encounter for Per ED notes: Pt states that she fell a few weeks ago on some steps, tried to catch herself with a chair and the chair fell on her as well, pt c/o pain to bilateral shoulders and her tailbone area that radiates down right leg as well, EXAM: RIGHT SHOULDER - 2+ VIEW COMPARISON:  08/16/2017 FINDINGS: No acute fracture or dislocation. Mild degenerate changes of the glenohumeral joint. IMPRESSION: Degenerative change, without acute osseous finding. Electronically Signed   By: Abigail Miyamoto M.D.   On: 03/22/2018 21:23   Dg Shoulder Left  Result Date: 03/22/2018 CLINICAL DATA:  Fall a few weeks ago. EXAM: LEFT SHOULDER - 2+ VIEW COMPARISON:  Chest radiograph 10/27/2017 FINDINGS: Mild degenerative irregularity of the undersurface of the acromioclavicular joint. Mild to moderate glenohumeral joint osteoarthritis. Visualized portion of the left hemithorax is normal. No acute fracture or dislocation. IMPRESSION: Degenerative change, without acute osseous finding. Electronically Signed   By: Abigail Miyamoto M.D.   On: 03/22/2018 21:22   Dg Knee Complete 4 Views Left  Result Date: 03/22/2018 CLINICAL DATA:  Initial encounter for Per ED notes: Pt states that she fell a few weeks ago on some steps, tried to catch herself with a chair and the chair fell on her as well, pt c/o pain to bilateral shoulders and her tailbone area that radiates down right leg as well, EXAM: LEFT KNEE - COMPLETE 4+ VIEW COMPARISON:  07/01/2017 FINDINGS: Moderate osteoarthritis involving the medial and patellofemoral compartments. Osteochondral defect involving the medial femoral condyle. No acute fracture or dislocation. No joint effusion. IMPRESSION: Degenerative change, without acute osseous finding. Osteochondral defect involving the medial femoral condyle, as before. Electronically Signed   By: Abigail Miyamoto M.D.   On: 03/22/2018 21:25    Procedures Procedures (including critical care  time)  Medications Ordered in ED Medications  traMADol (ULTRAM) tablet 100 mg (100 mg Oral Given 03/22/18 2026)     Initial Impression / Assessment and Plan / ED Course  I have reviewed the triage vital signs and the nursing notes.  Pertinent labs & imaging results that were available during my care of the patient were reviewed by me and considered in my medical decision making (see chart for details).  Clinical Course as of Mar 22 2225  Sun Mar 22, 2018  2224 57yo female with various injuries from 2 falls over the past few weeks.    [LM]    Clinical Course User Index [LM] Tacy Learn, PA-C    Final Clinical Impressions(s) / ED Diagnoses   Final diagnoses:  Fall, initial encounter  Adhesive capsulitis of both shoulders  Acute pain of left knee  Acute midline low back pain with right-sided sciatica  Osteoarthritis, unspecified osteoarthritis type, unspecified site    ED Discharge Orders        Ordered    traMADol (ULTRAM) 50 MG tablet  2 times daily     03/22/18 2131       Tacy Learn, PA-C 03/22/18 2226    Mesner, Corene Cornea, MD 03/22/18 2228

## 2018-04-25 ENCOUNTER — Other Ambulatory Visit: Payer: Self-pay | Admitting: Orthopedic Surgery

## 2018-06-28 IMAGING — MR MR KNEE*L* W/O CM
4 of 6 series · 29 of 40 positions shown · non-contrast
Comparison: 03/29/2016 radiographs

CLINICAL DATA: Medial knee pain over the past 3 months after
falling in a driveway.

EXAM:
MRI OF THE LEFT KNEE WITHOUT CONTRAST
TECHNIQUE: Multiplanar, multisequence MR imaging of the knee was performed. No
intravenous contrast was administered.

[Series 6: PD fat-sat · axial · left · 3.0mm · 0.39mm/px · z∈[-76,+39]mm · 9 of 36 slices shown (1 of 3)]
[im 1/36]
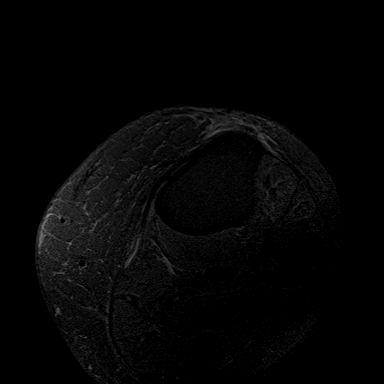
[im 5/36]
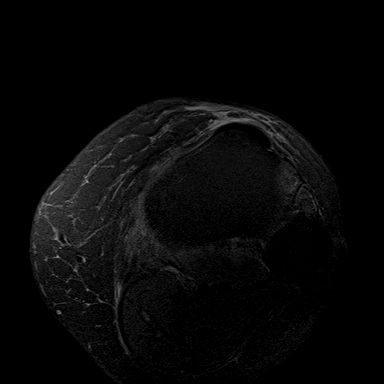
[im 9/36]
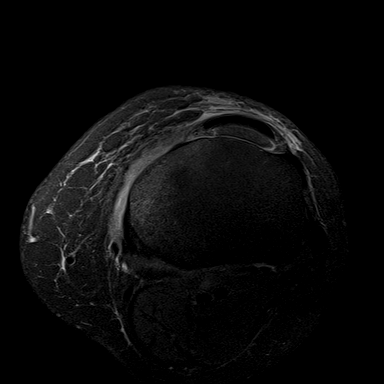
[im 14/36]
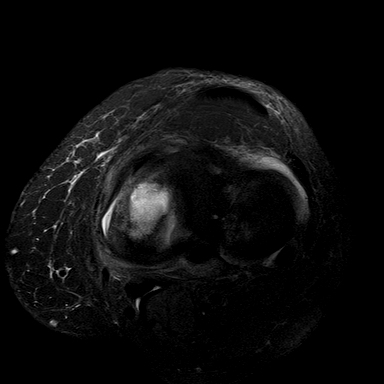
[im 18/36]
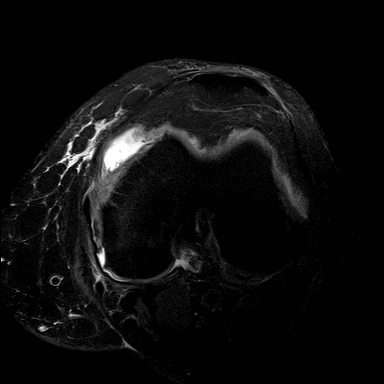
[im 22/36]
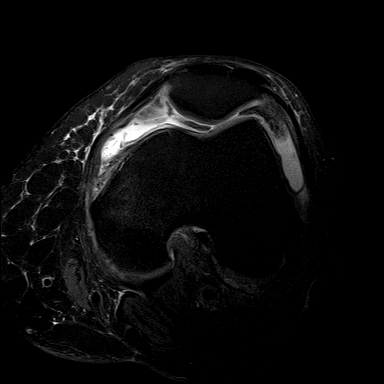
[im 27/36]
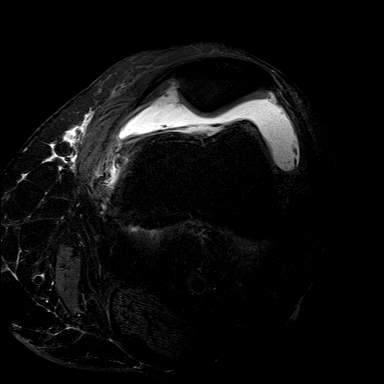
[im 31/36]
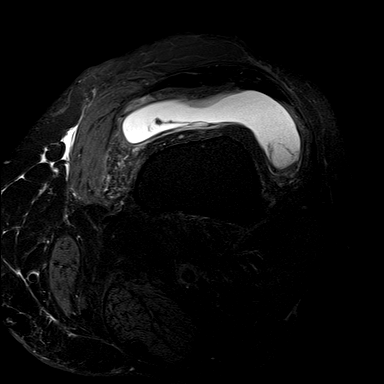
[im 36/36]
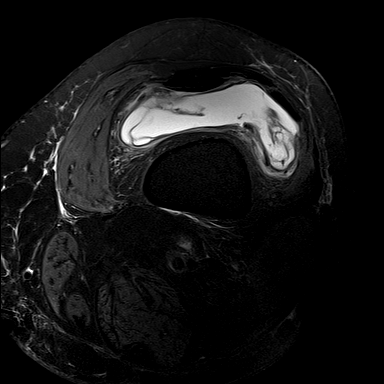

[Series 8: PD fat-sat · coronal · left · 3.0mm · 0.33mm/px · 7 of 33 slices shown (2 of 3)]
[im 1/33]
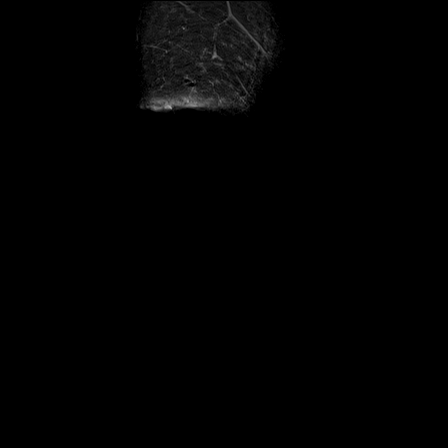
[im 6/33]
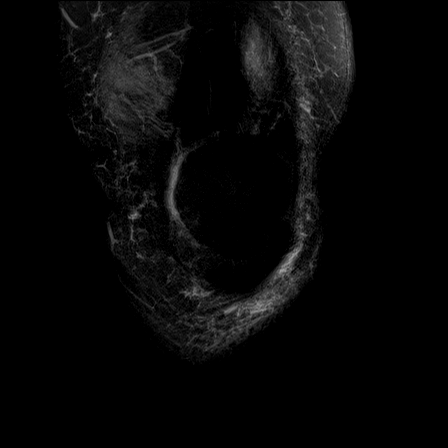
[im 11/33]
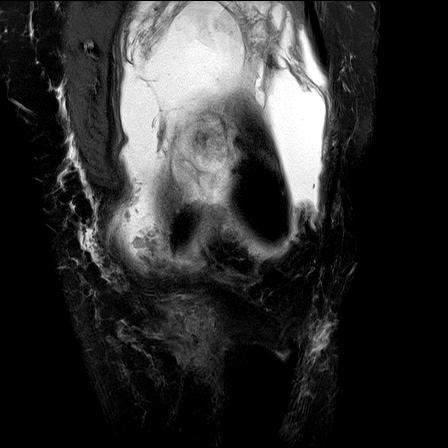
[im 17/33]
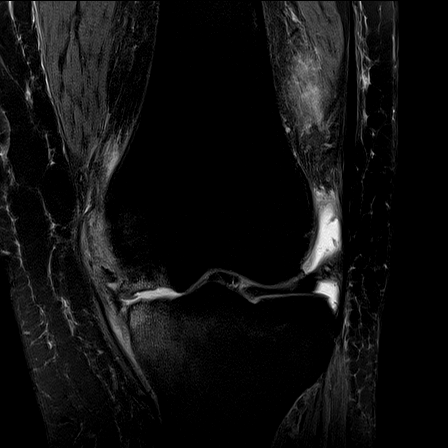
[im 22/33]
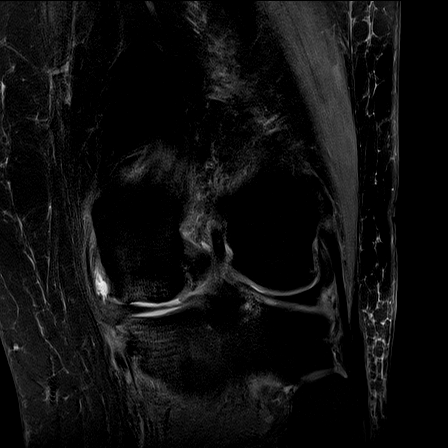
[im 27/33]
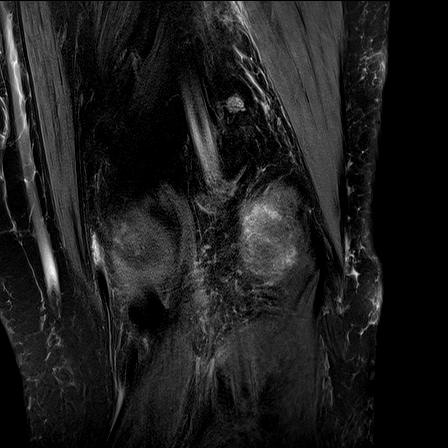
[im 33/33]
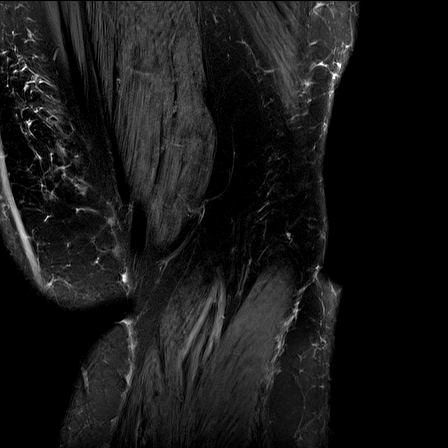

[Series 9: PD fat-sat · sagittal · left · 3.0mm · 0.39mm/px · 6 of 29 slices shown (3 of 3)]
[im 1/29]
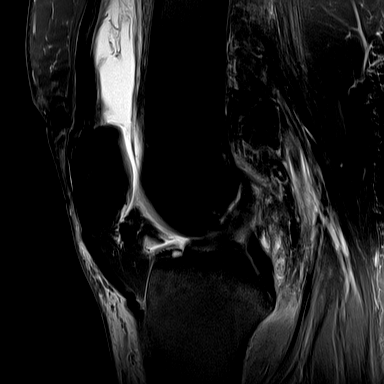
[im 6/29]
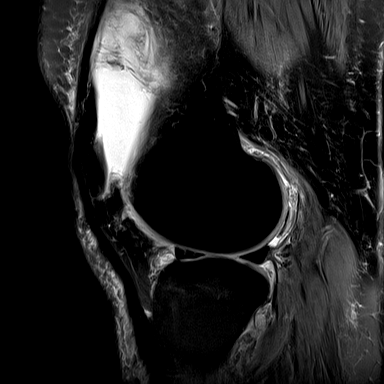
[im 12/29]
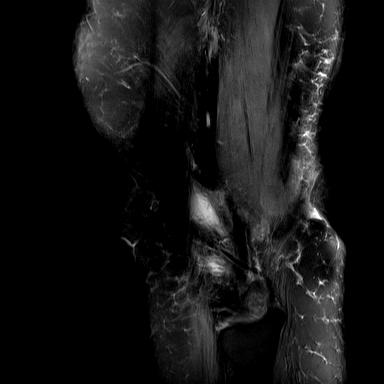
[im 17/29]
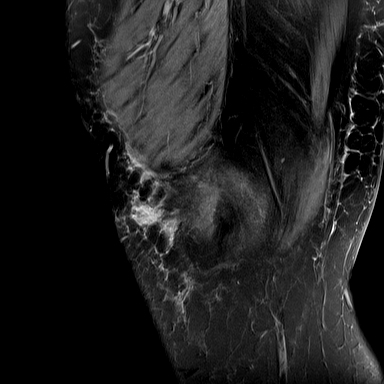
[im 23/29]
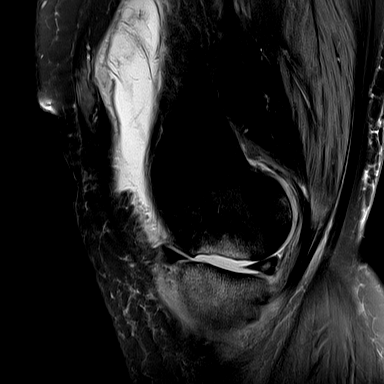
[im 29/29]
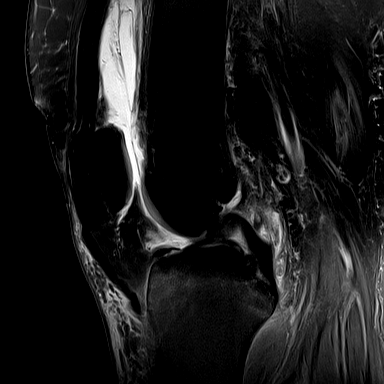

[Series 10: T2 fat-sat · coronal · left · 3.0mm · 0.39mm/px · 7 of 33 slices shown]
[im 1/33]
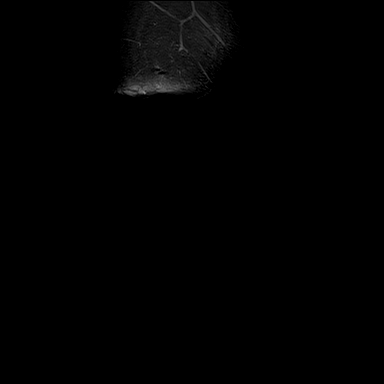
[im 6/33]
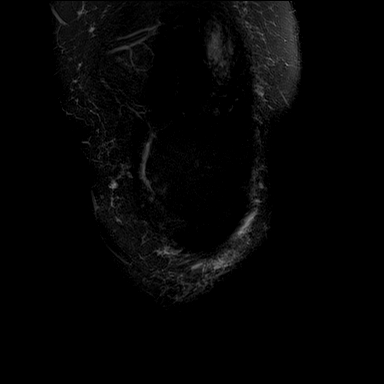
[im 11/33]
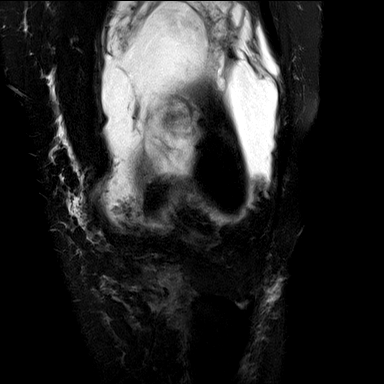
[im 17/33]
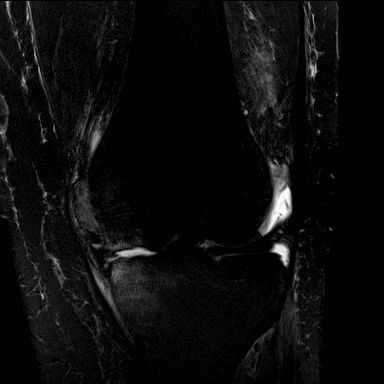
[im 22/33]
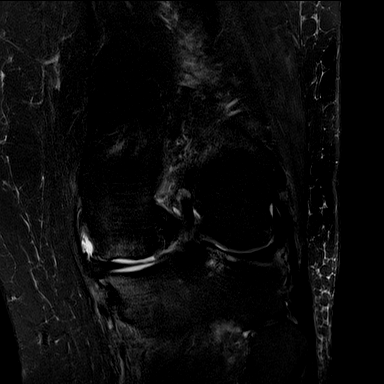
[im 27/33]
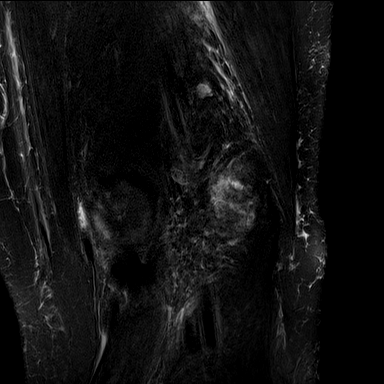
[im 33/33]
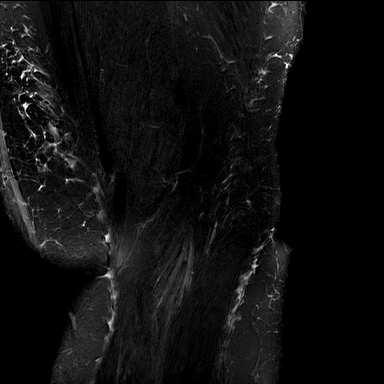

[29 of 40 positions shown; findings below may reference images not displayed]

FINDINGS: Despite efforts by the technologist and patient, motion artifact is
present on today's exam and could not be eliminated. This reduces
exam sensitivity and specificity.

MENISCI

Medial meniscus: Large radial tear of the midbody. Adjacent grade 1
signal in the posterior horn. Suspected flap of meniscal tissue in
the anteromedial gutter proximally.

Lateral meniscus: Mildly irregular grade 3 oblique tearing of the
anterior horn extending to the superior surface on image [DATE].

LIGAMENTS

Cruciates: There is an unusual appearance of accentuated tissue
along the inferior margin of the ACL anteriorly for example on image
[DATE], this may simply be a variant appearance of the ACL, less
likely to be a flap of meniscal tissue. PCL intact.

Collaterals: Thickened in the portion of the MCL probably related to
the underlying meniscal tear.

CARTILAGE

Patellofemoral:  Unremarkable

Medial: Severe full-thickness articular cartilage loss with a 2.2 by
1.5 cm scalloped cortical defect along the articular surface of the
medial femoral condyle. The loose 1.5 by 0.4 by 1.6 cm fragment
resulting from this lesion sits posteriorly in the joint behind the
lateral femoral condyle, image [DATE]. Subcortical marrow edema in the
medial compartment with marginal spurring. Possible subcortical
stress fracture in the medial tibial plateau, image [DATE].

Lateral:  Moderate chondral thinning with mild marginal spurring.

Joint: Large knee effusion with synovitis and septation superiorly.
Considerable medial synovitis in the joint especially below the
posterior portion of the medial patellar retinaculum and along the
margin of the medial tibial plateau.

Popliteal Fossa:  Unremarkable

Extensor Mechanism: Poor definition of the femoral attachment of the
medial patellofemoral ligament.

Bones: No significant extra-articular osseous abnormalities
identified.

Other: No supplemental non-categorized findings.
IMPRESSION: 1. Large radial tear mid body medial meniscus. Probable flap of
meniscal tissue in the anteromedial gutter proximally, adjacent to
considerable underlying synovitis.
2. Fragmented osteochondral lesion of the medial femoral condyle
with a scalloped bony defect along the articular surface on image
[DATE], and the resulting osteochondral fragment sitting posterior to
the lateral femoral condyle within the joint on image [DATE].
3. Severe osteoarthritis in the medial compartment with moderate
chondral thinning in the lateral compartment.
4. Suspected mild subcortical stress fracture in the medial tibial
plateau.
5. Thickened deep portion of the MCL probably related to the
underlying meniscal tear.
6. Accentuated tissue along the anterior inferior margin of the ACL,
probably incidental, less likely a flap of meniscal tissue.
7. Large knee effusion with synovitis and septation in the
suprapatellar bursa. Considerable medial synovitis.
8. Poor definition of the femoral attachment of the medial
patellofemoral ligament.

## 2018-07-16 ENCOUNTER — Encounter (INDEPENDENT_AMBULATORY_CARE_PROVIDER_SITE_OTHER): Payer: Self-pay | Admitting: Orthopaedic Surgery

## 2018-07-16 ENCOUNTER — Ambulatory Visit (INDEPENDENT_AMBULATORY_CARE_PROVIDER_SITE_OTHER): Payer: Medicaid Other | Admitting: Orthopaedic Surgery

## 2018-07-16 VITALS — BP 127/75 | HR 90 | Ht 63.0 in | Wt 224.0 lb

## 2018-07-16 DIAGNOSIS — M1712 Unilateral primary osteoarthritis, left knee: Secondary | ICD-10-CM

## 2018-07-16 MED ORDER — METHYLPREDNISOLONE ACETATE 40 MG/ML IJ SUSP
40.0000 mg | INTRAMUSCULAR | Status: AC | PRN
  Administered 2018-07-16: 40 mg via INTRA_ARTICULAR

## 2018-07-16 MED ORDER — LIDOCAINE HCL 1 % IJ SOLN
0.5000 mL | INTRAMUSCULAR | Status: AC | PRN
  Administered 2018-07-16: .5 mL

## 2018-07-16 MED ORDER — BUPIVACAINE HCL 0.5 % IJ SOLN
3.0000 mL | INTRAMUSCULAR | Status: AC | PRN
  Administered 2018-07-16: 3 mL via INTRA_ARTICULAR

## 2018-07-16 NOTE — Progress Notes (Signed)
Office Visit Note/orthopedic consultation requesting Octavio Graves   Patient: Tiffany Lynch           Date of Birth: [birth year]           MRN: [id number] Visit Date: 07/16/2018              Requested by: Octavio Graves, [place] [address] [place], [place] [id number] PCP: Octavio Graves, DO   Assessment & Plan: Visit Diagnoses:  1. Unilateral primary osteoarthritis, left knee     Plan: Intra-articular injection performed left knee.  She can return in a month.  She has a TENS unit that is been ordered she is used anti-inflammatories gel muscle relaxants.  We discussed her current weight and her BMI is just below 40 she needs to maintain her weight or lose some weight which we discussed.  Recheck 1 month.  Thank you for the opportunity to see her in consultation.  Follow-Up Instructions: No follow-ups on file.   Orders:  Orders Placed This Encounter  Procedures  . Large Joint Inj: L knee   No orders of the defined types were placed in this encounter.     Procedures: Large Joint Inj: L knee on 07/16/2018 2:14 PM Indications: joint swelling and pain Details: 22 G 1.5 in needle, anterolateral approach  Arthrogram: No  Medications: 0.5 mL lidocaine 1 %; 3 mL bupivacaine 0.5 %; 40 mg methylPREDNISolone acetate 40 MG/ML Outcome: tolerated well, no immediate complications Procedure, treatment alternatives, risks and benefits explained, specific risks discussed. Consent was given by the patient. Immediately prior to procedure a time out was called to verify the correct patient, procedure, equipment, support staff and site/side marked as required. Patient was prepped and draped in the usual sterile fashion.       Clinical Data: No additional findings.   Subjective: Chief Complaint  Patient presents with  . Left Knee - Pain  . Right Knee - Pain  . Right Shoulder - Pain  . Left Shoulder - Pain  . Lower Back - Pain    HPI 57 year old female seen with chronic knee pain left  more than right knee.  She also has chronic back pain she is been disabled she is in [place] [facility] currently taking [place].  Prior to that she was on buprenorphine, Norco.  She is to take Xanax for long period time and then had to be switched to Klonopin starting narcotic regiment.  Previous lumbar MRI last year showed some increased size of small foraminal disc protrusion on the right.  She states she has some right leg numbness.  No severe central stenosis.  History of multiple knee arthroscopies most recent 2 years ago.  She has upper respiratory infection currently her voice is cracking she is somnolent and poor historian.  Review of Systems 14 pt review of systems positive for history of alcohol abuse anxiety renal failure bipolar disorder chronic back pain with minimal changes on MRI scan, history of cocaine abuse now placed on a pain contract.  History of anxiety and depression bladder problems hypertension previous cervical conization.   Objective: Vital Signs: BP 127/75   Pulse 90   Ht 5\' 3"  (1.6 m)   Wt 224 lb (101.6 kg)   LMP 05/06/2012   BMI 39.68 kg/m   Physical Exam  Constitutional: She is oriented to person, place, and time. She appears well-developed.  HENT:  Head: Normocephalic.  Right Ear: External ear normal.  Left Ear: External ear normal.  Eyes: Pupils are equal, round, and reactive to light.  Neck: No tracheal deviation present. No thyromegaly present.  Cardiovascular: Normal rate.  Pulmonary/Chest: Effort normal.  Abdominal: Soft.  Neurological: She is alert and oriented to person, place, and time.  Skin: Skin is warm and dry.  Psychiatric: She has a normal mood and affect. Her behavior is normal.    Ortho Exam patient is amatory with a walker.  She has crepitus with the left knee more than right with 2+ knee effusion.  Bilateral pitting edema slow deliberate gait.  Some discomfort straight leg raising at 90 degrees.  Specialty Comments:  No specialty  comments available.  Imaging: No results found.   PMFS History: Patient Active Problem List   Diagnosis Date Noted  . Rib fractures 07/01/2017  . Tobacco dependence 07/01/2017  . Derangement of posterior horn of medial meniscus of left knee   . Loose body of left knee   . Hypotension 08/14/2016  . Respiratory failure with hypoxia ([place]) 08/14/2016  . Hyperglycemia 08/14/2016  . Alcohol abuse 08/14/2016  . Cocaine abuse ([place]) 08/14/2016  . Chronic pain 08/14/2016  . Oral thrush 08/14/2016  . Hypokalemia 05/31/2016  . Pre-syncope 05/31/2016  . Current smoker 07/20/2015  . Urge and stress incontinence 07/14/2015  . Hyperlipidemia 01/02/2015  . Insomnia 01/02/2015  . OAB (overactive bladder) 01/02/2015  . Depression 01/02/2015  . GERD (gastroesophageal reflux disease) 01/02/2015  . ARF (acute renal failure) ([place]) 12/21/2013  . COPD (chronic obstructive pulmonary disease) ([place]) 05/22/2012  . Dysfunctional uterine bleeding 04/01/2012  . Orthostatic hypotension 03/31/2012  . Encephalopathy acute 03/30/2012  . Chronic back pain   . Bipolar 1 disorder ([place])   . Anxiety    Past Medical History:  Diagnosis Date  . Anxiety   . Bipolar 1 disorder ([place])   . Chronic back pain   . Chronic knee pain   . COPD (chronic obstructive pulmonary disease) ([place])   . Depression   . GERD (gastroesophageal reflux disease)   . History of anemia   . Hypertension   . Lumbar radiculopathy   . On home oxygen therapy "6 years ago"   3LPM PRN: "had this 6 years ago," pt no longer has O2 at home (07/01/2017)  . Sleep apnea     Family History  Problem Relation Age of Onset  . Hypertension Mother   . Hypertension Brother   . Anxiety disorder Brother   . Depression Brother     Past Surgical History:  Procedure Laterality Date  . COLONOSCOPY WITH PROPOFOL N/A 07/18/2014   Procedure: COLONOSCOPY WITH PROPOFOL;  Surgeon: Rogene Houston, MD;  Location: [facility];  Service: Endoscopy;  Laterality:  N/A;  in cecum at 0753, withdrawal time 20 min  . DILATION AND CURETTAGE OF UTERUS  age 65  . KNEE ARTHROSCOPY WITH MEDIAL MENISECTOMY Left 10/10/2016   Procedure: KNEE ARTHROSCOPY WITH MEDIAL MENISECTOMY REMOVAL LOSE BODY;  Surgeon: Carole Civil, MD;  Location: [facility];  Service: Orthopedics;  Laterality: Left;  . POLYPECTOMY  05/22/2012   Procedure: POLYPECTOMY;  Surgeon: Jonnie Kind, MD;  Location: [facility];  Service: Gynecology;  Laterality: N/A;  Endometrial Polypectomy  . POLYPECTOMY N/A 07/18/2014   Procedure: POLYPECTOMY;  Surgeon: Rogene Houston, MD;  Location: [facility];  Service: Endoscopy;  Laterality: N/A;  . TUBAL LIGATION     Social History   Occupational History  . Occupation: disabled  Tobacco Use  . Smoking status: Current Every Day Smoker  Packs/day: 1.00    Years: 41.00    Pack years: 41.00    Types: Cigarettes  . Smokeless tobacco: Never Used  Substance and Sexual Activity  . Alcohol use: Yes    Alcohol/week: 2.0 - 3.0 standard drinks    Types: 2 - 3 Glasses of wine per week    Comment: drinks wine several times a week  . Drug use: No    Types: Marijuana    Comment: history of cocaine and marijuana use, reports no use in about 6 months  . Sexual activity: Yes    Birth control/protection: Surgical    Comment: tubes tied

## 2018-08-13 ENCOUNTER — Ambulatory Visit (INDEPENDENT_AMBULATORY_CARE_PROVIDER_SITE_OTHER): Payer: Medicaid Other | Admitting: Orthopaedic Surgery

## 2018-08-20 ENCOUNTER — Ambulatory Visit (INDEPENDENT_AMBULATORY_CARE_PROVIDER_SITE_OTHER): Payer: Medicaid Other | Admitting: Orthopaedic Surgery

## 2018-08-20 ENCOUNTER — Encounter (INDEPENDENT_AMBULATORY_CARE_PROVIDER_SITE_OTHER): Payer: Self-pay | Admitting: Orthopaedic Surgery

## 2018-08-20 VITALS — BP 122/77 | HR 87 | Ht 63.0 in | Wt 224.0 lb

## 2018-08-20 DIAGNOSIS — M1712 Unilateral primary osteoarthritis, left knee: Secondary | ICD-10-CM

## 2018-08-20 NOTE — Progress Notes (Signed)
Office Visit Note   Patient: Tiffany Lynch           Date of Birth: March 31, 1961           MRN: [id number] Visit Date: 08/20/2018              Requested by: Octavio Graves, [address] [address] [address] [place], [place] [address] PCP: Octavio Graves, DO   Assessment & Plan: Visit Diagnoses:  1. Unilateral primary osteoarthritis, left knee     Plan: Patient has knee osteoarthritis with osteochondral defect medial femoral condyle with joint space narrowing.  We discussed problems with total knee arthroplasty with pain control problems with making progress with physical therapy and problems with chronic pain.  She would need to wean down off of her pain medication and be off of it for a month for she be considered a surgical candidate so she could cooperate with physical therapy and be able to be given medication to control her pain.  She can work on this and then return when she is achieved it to consider further discussion.  Follow-Up Instructions: Return if symptoms worsen or fail to improve.   Orders:  No orders of the defined types were placed in this encounter.  No orders of the defined types were placed in this encounter.     Procedures: No procedures performed   Clinical Data: No additional findings.   Subjective: Chief Complaint  Patient presents with  . Lower Back - Pain, Follow-up  . Right Shoulder - Pain, Follow-up  . Left Shoulder - Pain, Follow-up  . Right Knee - Pain, Follow-up    HPI 57 year old female returns using a walker.  She states she is having continued problems with severe left knee pain.  She is somnolent from medication, states she is having trouble walking and has used a walker.  She keeps repeating and questioning what am I going to do".  Past history of bipolar 1 disorder, anxiety history of encephalopathy, alcohol abuse, cocaine abuse, in clinic patient on Belbuca 150 mcg film, Butrans patch 20 mcg/h, Flexeril Klonopin and Sinequan.  Review of Systems  positive for right history of renal failure, anxiety, chronic back pain with minimal changes on lumbar MRI, hypertension bladder problems previous cervical conization, chronic benzodiazepine use, left knee osteoarthritis, tobacco dependence, history of falls with rib fractures, 14 point review of systems otherwise negative is a pertains HPI.   Objective: Vital Signs: BP 122/77   Pulse 87   Ht 5\' 3"  (1.6 m)   Wt 224 lb (101.6 kg)   LMP 05/06/2012   BMI 39.68 kg/m   Physical Exam Constitutional:      Appearance: She is well-developed.     Comments: Patient appears medicated some slurring of her words, requests  repeat of explanations with repeating some questions.  HENT:     Head: Normocephalic.     Right Ear: External ear normal.     Left Ear: External ear normal.  Eyes:     Pupils: Pupils are equal, round, and reactive to light.  Neck:     Thyroid: No thyromegaly.     Trachea: No tracheal deviation.  Cardiovascular:     Rate and Rhythm: Normal rate.  Pulmonary:     Effort: Pulmonary effort is normal.  Abdominal:     Palpations: Abdomen is soft.  Skin:    General: Skin is warm and dry.  Neurological:     Mental Status: She is oriented to person, place,  and time.  Psychiatric:     Comments: Depressed, medicated, poor historian.     Ortho Exam left knee has tenderness with crepitus with knee range of motion medial joint line tenderness collateral ligaments are stable distal pulses intact.  Slow stride limping gait with walker.  Specialty Comments:  No specialty comments available.  Imaging: CLINICAL DATA:  Initial encounter for Per ED notes: Pt states that she fell a few weeks ago on some steps, tried to catch herself with a chair and the chair fell on her as well, pt c/o pain to bilateral shoulders and her tailbone area that radiates down right leg as well,  EXAM: LEFT KNEE - COMPLETE 4+ VIEW  COMPARISON:  07/01/2017  FINDINGS: Moderate osteoarthritis  involving the medial and patellofemoral compartments. Osteochondral defect involving the medial femoral condyle. No acute fracture or dislocation. No joint effusion.  IMPRESSION: Degenerative change, without acute osseous finding.  Osteochondral defect involving the medial femoral condyle, as before.   Electronically Signed   By: Abigail Miyamoto M.D.   On: 03/22/2018 21:25    PMFS History: Patient Active Problem List   Diagnosis Date Noted  . Rib fractures 07/01/2017  . Tobacco dependence 07/01/2017  . Derangement of posterior horn of medial meniscus of left knee   . Loose body of left knee   . Hypotension 08/14/2016  . Respiratory failure with hypoxia ([place]) 08/14/2016  . Hyperglycemia 08/14/2016  . Alcohol abuse 08/14/2016  . Cocaine abuse ([place]) 08/14/2016  . Chronic pain 08/14/2016  . Oral thrush 08/14/2016  . Hypokalemia 05/31/2016  . Pre-syncope 05/31/2016  . Current smoker 07/20/2015  . Urge and stress incontinence 07/14/2015  . Hyperlipidemia 01/02/2015  . Insomnia 01/02/2015  . OAB (overactive bladder) 01/02/2015  . Depression 01/02/2015  . GERD (gastroesophageal reflux disease) 01/02/2015  . ARF (acute renal failure) ([place]) 12/21/2013  . COPD (chronic obstructive pulmonary disease) ([place]) 05/22/2012  . Dysfunctional uterine bleeding 04/01/2012  . Orthostatic hypotension 03/31/2012  . Encephalopathy acute 03/30/2012  . Chronic back pain   . Bipolar 1 disorder ([place])   . Anxiety    Past Medical History:  Diagnosis Date  . Anxiety   . Bipolar 1 disorder ([place])   . Chronic back pain   . Chronic knee pain   . COPD (chronic obstructive pulmonary disease) ([place])   . Depression   . GERD (gastroesophageal reflux disease)   . History of anemia   . Hypertension   . Lumbar radiculopathy   . On home oxygen therapy "6 years ago"   3LPM PRN: "had this 6 years ago," pt no longer has O2 at home (07/01/2017)  . Sleep apnea     Family History  Problem Relation Age  of Onset  . Hypertension Mother   . Hypertension Brother   . Anxiety disorder Brother   . Depression Brother     Past Surgical History:  Procedure Laterality Date  . COLONOSCOPY WITH PROPOFOL N/A 07/18/2014   Procedure: COLONOSCOPY WITH PROPOFOL;  Surgeon: Rogene Houston, MD;  Location: [facility];  Service: Endoscopy;  Laterality: N/A;  in cecum at 0753, withdrawal time 20 min  . DILATION AND CURETTAGE OF UTERUS  age 60  . KNEE ARTHROSCOPY WITH MEDIAL MENISECTOMY Left 10/10/2016   Procedure: KNEE ARTHROSCOPY WITH MEDIAL MENISECTOMY REMOVAL LOSE BODY;  Surgeon: Carole Civil, MD;  Location: [facility];  Service: Orthopedics;  Laterality: Left;  . POLYPECTOMY  05/22/2012   Procedure: POLYPECTOMY;  Surgeon: Jonnie Kind, MD;  Location: [facility];  Service: Gynecology;  Laterality: N/A;  Endometrial Polypectomy  . POLYPECTOMY N/A 07/18/2014   Procedure: POLYPECTOMY;  Surgeon: Rogene Houston, MD;  Location: [facility];  Service: Endoscopy;  Laterality: N/A;  . TUBAL LIGATION     Social History   Occupational History  . Occupation: disabled  Tobacco Use  . Smoking status: Current Every Day Smoker    Packs/day: 1.00    Years: 41.00    Pack years: 41.00    Types: Cigarettes  . Smokeless tobacco: Never Used  Substance and Sexual Activity  . Alcohol use: Yes    Alcohol/week: 2.0 - 3.0 standard drinks    Types: 2 - 3 Glasses of wine per week    Comment: drinks wine several times a week  . Drug use: No    Types: Marijuana    Comment: history of cocaine and marijuana use, reports no use in about 6 months  . Sexual activity: Yes    Birth control/protection: Surgical    Comment: tubes tied

## 2018-09-24 ENCOUNTER — Encounter (INDEPENDENT_AMBULATORY_CARE_PROVIDER_SITE_OTHER): Payer: Self-pay | Admitting: Orthopaedic Surgery

## 2018-09-24 ENCOUNTER — Telehealth (INDEPENDENT_AMBULATORY_CARE_PROVIDER_SITE_OTHER): Payer: Self-pay | Admitting: Orthopaedic Surgery

## 2018-09-24 ENCOUNTER — Ambulatory Visit (INDEPENDENT_AMBULATORY_CARE_PROVIDER_SITE_OTHER): Payer: Medicaid Other | Admitting: Orthopaedic Surgery

## 2018-09-24 VITALS — BP 127/87 | HR 95 | Ht 63.0 in | Wt 204.0 lb

## 2018-09-24 DIAGNOSIS — M1712 Unilateral primary osteoarthritis, left knee: Secondary | ICD-10-CM

## 2018-09-24 DIAGNOSIS — F17200 Nicotine dependence, unspecified, uncomplicated: Secondary | ICD-10-CM | POA: Diagnosis not present

## 2018-09-24 DIAGNOSIS — G894 Chronic pain syndrome: Secondary | ICD-10-CM

## 2018-09-24 NOTE — Telephone Encounter (Signed)
Medical Clearance Request has been sent to patient's PCP Octavio Graves for the Left Total Knee Arthroplasty. Patient is also requesting that we send today's office visit notes to [facility]. She state she has an appointment with them tomorrow.   Attention:  Jeanella Anton, NP  Phone [phone] Fax  [phone]

## 2018-09-24 NOTE — Telephone Encounter (Signed)
Faxed per patient request

## 2018-09-24 NOTE — Progress Notes (Signed)
Office Visit Note   Patient: Tiffany Lynch           Date of Birth: 10/24/1960           MRN: [id number] Visit Date: 09/24/2018              Requested by: Octavio Graves, [address] [place], [place] [address] PCP: Octavio Graves, DO   Assessment & Plan: Visit Diagnoses:  1. Unilateral primary osteoarthritis, left knee   2. Chronic pain syndrome   3. Tobacco dependence     Plan: We discussed patient she should avoid narcotics she can use ice, continue using her walker.  We will schedule her for left total knee arthroplasty she will require medical clearance.  We discussed smoking cessation or minimizing cigarette use to help with her pulmonary status after the procedure.  Questions were elicited and answered.  We again discussed problems with narcotic withdrawal and pain management after surgery with use of short-term narcotics that would be weaned.  He understands and requests we proceed.  Multiple family members at home who could assist her after discharge.  We discussed home physical therapy for couple weeks and then outpatient therapy which could be done at the [facility] outpatient therapy campus.  She understands request to proceed.  Follow-Up Instructions: No follow-ups on file.   Orders:  No orders of the defined types were placed in this encounter.  No orders of the defined types were placed in this encounter.     Procedures: No procedures performed   Clinical Data: No additional findings.   Subjective: Chief Complaint  Patient presents with  . Left Knee - Pain    HPI 58 year old female returns with severe left knee osteoarthritis with medial femoral osteochondral defect and osteoarthritis.  She states she has been off Belbuca 150 mcg film which she took twice daily for 11 days.  She is requesting being scheduled for total knee arthroplasty as soon as possible and also is requesting something for pain.  She states her shoulders are bothering her where  she has osteoarthritis and also her right knee to some degree but her most painful problem is a left knee and she is having to ambulate with a walker.  Patient is on Wellbutrin, BuSpar and also Klonopin.  She has had diarrhea for the last 3 days likely related to narcotic withdrawal.  She denies nausea or vomiting. Review of Systems had past history of COPD she still smokes.  History of renal failure hyperlipidemia, past alcohol abuse as well as cocaine abuse she denies both currently.  Positive for bipolar disorder, hyperglycemia.  Negative for dyspnea.  Negative for chest pain.  Left knee arthroscopy medial meniscectomy 2018.   Objective: Vital Signs: BP 127/87   Pulse 95   Ht 5\' 3"  (1.6 m)   Wt 204 lb (92.5 kg)   LMP 05/06/2012   BMI 36.14 kg/m   Physical Exam Constitutional:      Appearance: She is well-developed.  HENT:     Head: Normocephalic.     Right Ear: External ear normal.     Left Ear: External ear normal.  Eyes:     Pupils: Pupils are equal, round, and reactive to light.  Neck:     Thyroid: No thyromegaly.     Trachea: No tracheal deviation.  Cardiovascular:     Rate and Rhythm: Normal rate.  Pulmonary:     Effort: Pulmonary effort is normal.  Abdominal:  Palpations: Abdomen is soft.  Skin:    General: Skin is warm and dry.  Neurological:     Mental Status: She is alert and oriented to person, place, and time.  Psychiatric:        Behavior: Behavior normal.     Ortho Exam patient has full knee extension flexion 110 degrees.  Palpable medial osteophytes with medial joint line tenderness patellofemoral crepitus.  Distal pulses are palpable.  No trochanteric bursal tenderness.  Opposite right knee shows mild crepitus.  Positive impingement right greater than left shoulder.  Knee and ankle jerk are intact.  Specialty Comments:  No specialty comments available.  Imaging: No results found.   PMFS History: Patient Active Problem List   Diagnosis Date Noted   . Rib fractures 07/01/2017  . Tobacco dependence 07/01/2017  . Derangement of posterior horn of medial meniscus of left knee   . Loose body of left knee   . Hypotension 08/14/2016  . Respiratory failure with hypoxia ([place]) 08/14/2016  . Hyperglycemia 08/14/2016  . Alcohol abuse 08/14/2016  . Cocaine abuse ([place]) 08/14/2016  . Chronic pain 08/14/2016  . Oral thrush 08/14/2016  . Hypokalemia 05/31/2016  . Pre-syncope 05/31/2016  . Current smoker 07/20/2015  . Urge and stress incontinence 07/14/2015  . Hyperlipidemia 01/02/2015  . Insomnia 01/02/2015  . OAB (overactive bladder) 01/02/2015  . Depression 01/02/2015  . GERD (gastroesophageal reflux disease) 01/02/2015  . ARF (acute renal failure) ([place]) 12/21/2013  . COPD (chronic obstructive pulmonary disease) ([place]) 05/22/2012  . Dysfunctional uterine bleeding 04/01/2012  . Orthostatic hypotension 03/31/2012  . Encephalopathy acute 03/30/2012  . Chronic back pain   . Bipolar 1 disorder ([place])   . Anxiety    Past Medical History:  Diagnosis Date  . Anxiety   . Bipolar 1 disorder ([place])   . Chronic back pain   . Chronic knee pain   . COPD (chronic obstructive pulmonary disease) ([place])   . Depression   . GERD (gastroesophageal reflux disease)   . History of anemia   . Hypertension   . Lumbar radiculopathy   . On home oxygen therapy "6 years ago"   3LPM PRN: "had this 6 years ago," pt no longer has O2 at home (07/01/2017)  . Sleep apnea     Family History  Problem Relation Age of Onset  . Hypertension Mother   . Hypertension Brother   . Anxiety disorder Brother   . Depression Brother     Past Surgical History:  Procedure Laterality Date  . COLONOSCOPY WITH PROPOFOL N/A 07/18/2014   Procedure: COLONOSCOPY WITH PROPOFOL;  Surgeon: Rogene Houston, MD;  Location: [facility];  Service: Endoscopy;  Laterality: N/A;  in cecum at 0753, withdrawal time 20 min  . DILATION AND CURETTAGE OF UTERUS  age 41  . KNEE ARTHROSCOPY WITH MEDIAL  MENISECTOMY Left 10/10/2016   Procedure: KNEE ARTHROSCOPY WITH MEDIAL MENISECTOMY REMOVAL LOSE BODY;  Surgeon: Carole Civil, MD;  Location: [facility];  Service: Orthopedics;  Laterality: Left;  . POLYPECTOMY  05/22/2012   Procedure: POLYPECTOMY;  Surgeon: Jonnie Kind, MD;  Location: [facility];  Service: Gynecology;  Laterality: N/A;  Endometrial Polypectomy  . POLYPECTOMY N/A 07/18/2014   Procedure: POLYPECTOMY;  Surgeon: Rogene Houston, MD;  Location: [facility];  Service: Endoscopy;  Laterality: N/A;  . TUBAL LIGATION     Social History   Occupational History  . Occupation: disabled  Tobacco Use  . Smoking status: Current Every Day Smoker  Packs/day: 1.00    Years: 41.00    Pack years: 41.00    Types: Cigarettes  . Smokeless tobacco: Never Used  Substance and Sexual Activity  . Alcohol use: Yes    Alcohol/week: 2.0 - 3.0 standard drinks    Types: 2 - 3 Glasses of wine per week    Comment: drinks wine several times a week  . Drug use: No    Types: Marijuana    Comment: history of cocaine and marijuana use, reports no use in about 6 months  . Sexual activity: Yes    Birth control/protection: Surgical    Comment: tubes tied

## 2018-11-04 ENCOUNTER — Other Ambulatory Visit (HOSPITAL_COMMUNITY): Payer: Self-pay

## 2018-11-04 NOTE — Pre-Procedure Instructions (Addendum)
Tiffany Lynch  11/04/2018      [place], [place] - [address] [place] [address] Phone: [phone] Fax: [phone]  [id number] - [place], [place] [address] Phone: [phone] Fax: [phone]    Your procedure is scheduled on Monday March 9th.  Report to [facility] Admitting at 1:00 PM.  Call this number if you have problems the morning of surgery:  [phone]   Remember:  Do not eat or drink after midnight.      Take these medicines the morning of surgery with A SIP OF WATER  amLODipine (NORVASC) buPROPion (WELLBUTRIN XL) busPIRone (BUSPAR)  DULoxetine (CYMBALTA) loratadine (CLARITIN)  metoprolol tartrate (LOPRESSOR)  mometasone (NASONEX)  omeprazole (PRILOSEC) oxybutynin (DITROPAN) ALPRAZolam (XANAX)-if needed clonazePAM (KLONOPIN)-if needed cyclobenzaprine (FLEXERIL)-if needed  7 days prior to surgery STOP taking any Aspirin(unless otherwise instructed by your surgeon), diclofenac (VOLTAREN), meloxicam (MOBIC),  Aleve, Naproxen, Ibuprofen, Motrin, Advil, Goody's, BC's, all herbal medications, fish oil, and all vitamins and VOLTAREN 1 % GEL   Do not wear jewelry, make-up or nail polish.  Do not wear lotions, powders, or perfumes, or deodorant.  Do not shave 48 hours prior to surgery.    Do not bring valuables to the hospital.  [facility] is not responsible for any belongings or valuables.  Contacts, dentures or bridgework may not be worn into surgery.  Leave your suitcase in the car.  After surgery it may be brought to your room.  For patients admitted to the hospital, discharge time will be determined by your treatment team.  Patients discharged the day of surgery will not be allowed to drive home.   Tiffany Lynch- Preparing For Surgery  Before surgery, you can play an important role. Because skin is not sterile, your skin needs to be as  free of germs as possible. You can reduce the number of germs on your skin by washing with CHG (chlorahexidine gluconate) Soap before surgery.  CHG is an antiseptic cleaner which kills germs and bonds with the skin to continue killing germs even after washing.    Oral Hygiene is also important to reduce your risk of infection.  Remember - BRUSH YOUR TEETH THE MORNING OF SURGERY WITH YOUR REGULAR TOOTHPASTE  Please do not use if you have an allergy to CHG or antibacterial soaps. If your skin becomes reddened/irritated stop using the CHG.  Do not shave (including legs and underarms) for at least 48 hours prior to first CHG shower. It is OK to shave your face.  Please follow these instructions carefully.   1. Shower the NIGHT BEFORE SURGERY and the MORNING OF SURGERY with CHG.   2. If you chose to wash your hair, wash your hair first as usual with your normal shampoo.  3. After you shampoo, rinse your hair and body thoroughly to remove the shampoo.  4. Use CHG as you would any other liquid soap. You can apply CHG directly to the skin and wash gently with a scrungie or a clean washcloth.   5. Apply the CHG Soap to your body ONLY FROM THE NECK DOWN.  Do not use on open wounds or open sores. Avoid contact with your eyes, ears, mouth and genitals (private parts). Wash Face and genitals (private parts)  with your normal soap.  6. Wash thoroughly, paying special attention to the area where your surgery will be performed.  7.  Thoroughly rinse your body with warm water from the neck down.  8. DO NOT shower/wash with your normal soap after using and rinsing off the CHG Soap.  9. Pat yourself dry with a CLEAN TOWEL.  10. Wear CLEAN PAJAMAS to bed the night before surgery, wear comfortable clothes the morning of surgery  11. Place CLEAN SHEETS on your bed the night of your first shower and DO NOT SLEEP WITH PETS.    Day of Surgery: Shower as stated above. Do not apply any deodorants/lotions.   Please wear clean clothes to the hospital/surgery center.   Remember to brush your teeth WITH YOUR REGULAR TOOTHPASTE.  Please read over the following fact sheets that you were given.

## 2018-11-05 ENCOUNTER — Encounter (INDEPENDENT_AMBULATORY_CARE_PROVIDER_SITE_OTHER): Payer: Self-pay | Admitting: Surgery

## 2018-11-05 ENCOUNTER — Ambulatory Visit (INDEPENDENT_AMBULATORY_CARE_PROVIDER_SITE_OTHER): Payer: Medicaid Other | Admitting: Surgery

## 2018-11-05 ENCOUNTER — Encounter (HOSPITAL_COMMUNITY)
Admission: RE | Admit: 2018-11-05 | Discharge: 2018-11-05 | Disposition: A | Discharge status: Home / Self Care | Payer: Medicaid Other | Source: Ambulatory Visit | Attending: *Deleted | Admitting: *Deleted

## 2018-11-05 VITALS — BP 133/90 | HR 78 | Temp 97.0°F | Ht 63.0 in | Wt 224.0 lb

## 2018-11-05 DIAGNOSIS — M1712 Unilateral primary osteoarthritis, left knee: Secondary | ICD-10-CM

## 2018-11-10 ENCOUNTER — Encounter (HOSPITAL_COMMUNITY): Payer: Self-pay | Admitting: Physician Assistant

## 2018-11-10 ENCOUNTER — Inpatient Hospital Stay (HOSPITAL_COMMUNITY): Admission: RE | Admit: 2018-11-10 | Payer: Self-pay | Source: Ambulatory Visit

## 2018-11-11 ENCOUNTER — Other Ambulatory Visit: Payer: Self-pay

## 2018-11-11 ENCOUNTER — Encounter (HOSPITAL_COMMUNITY): Payer: Self-pay

## 2018-11-11 ENCOUNTER — Ambulatory Visit (HOSPITAL_COMMUNITY)
Admission: RE | Admit: 2018-11-11 | Discharge: 2018-11-11 | Disposition: A | Discharge status: Home / Self Care | Payer: Medicaid Other | Source: Ambulatory Visit | Attending: Surgery | Admitting: Surgery

## 2018-11-11 ENCOUNTER — Encounter (HOSPITAL_COMMUNITY)
Admission: RE | Admit: 2018-11-11 | Discharge: 2018-11-11 | Disposition: A | Discharge status: Home / Self Care | Payer: Medicaid Other | Source: Ambulatory Visit | Attending: Orthopaedic Surgery | Admitting: Orthopaedic Surgery

## 2018-11-11 DIAGNOSIS — Z01818 Encounter for other preprocedural examination: Secondary | ICD-10-CM | POA: Diagnosis not present

## 2018-11-11 HISTORY — DX: Dyspnea, unspecified: R06.00

## 2018-11-11 HISTORY — DX: Unspecified osteoarthritis, unspecified site: M19.90

## 2018-11-11 LAB — COMPREHENSIVE METABOLIC PANEL
ALT: 32 U/L (ref 0–44)
AST: 17 U/L (ref 15–41)
Albumin: 3.6 g/dL (ref 3.5–5.0)
Alkaline Phosphatase: 91 U/L (ref 38–126)
Anion gap: 10 (ref 5–15)
BUN: 17 mg/dL (ref 6–20)
CO2: 31 mmol/L (ref 22–32)
Calcium: 8.9 mg/dL (ref 8.9–10.3)
Chloride: 98 mmol/L (ref 98–111)
Creatinine, Ser: 0.92 mg/dL (ref 0.44–1.00)
GFR calc Af Amer: >60 mL/min (ref >60)
GFR calc non Af Amer: >60 mL/min (ref >60)
Glucose, Bld: 91 mg/dL (ref 70–99)
Potassium: 3.2 mmol/L — ABNORMAL LOW (ref 3.5–5.1)
Sodium: 139 mmol/L (ref 135–145)
Total Bilirubin: 0.4 mg/dL (ref 0.3–1.2)
Total Protein: 6.3 g/dL — ABNORMAL LOW (ref 6.5–8.1)

## 2018-11-11 LAB — CBC
HCT: 46.6 % — ABNORMAL HIGH (ref 36.0–46.0)
Hemoglobin: 15.1 g/dL — ABNORMAL HIGH (ref 12.0–15.0)
MCH: 29.8 pg (ref 26.0–34.0)
MCHC: 32.4 g/dL (ref 30.0–36.0)
MCV: 92.1 fL (ref 80.0–100.0)
Platelets: 342 10*3/uL (ref 150–400)
RBC: 5.06 MIL/uL (ref 3.87–5.11)
RDW: 14.9 % (ref 11.5–15.5)
WBC: 18.8 10*3/uL — ABNORMAL HIGH (ref 4.0–10.5)
nRBC: 0 % (ref 0.0–0.2)

## 2018-11-11 LAB — URINALYSIS, ROUTINE W REFLEX MICROSCOPIC
Bilirubin Urine: NEGATIVE
Glucose, UA: NEGATIVE mg/dL
Hgb urine dipstick: NEGATIVE
Ketones, ur: NEGATIVE mg/dL
Leukocytes,Ua: NEGATIVE
Nitrite: NEGATIVE
Protein, ur: NEGATIVE mg/dL
Specific Gravity, Urine: 1.01 (ref 1.005–1.030)
pH: 6 (ref 5.0–8.0)

## 2018-11-11 LAB — SURGICAL PCR SCREEN
MRSA, PCR: NEGATIVE
Staphylococcus aureus: NEGATIVE

## 2018-11-11 NOTE — Progress Notes (Signed)
PCP -  Octavio Graves @ [facility] in [facility] Cardiologist - na  Chest x-ray - today EKG - today Stress Test - na ECHO - 12/2013 Cardiac Cath - na  Sleep Study - yrs. ago CPAP - pt. Doesn't use it  Fasting Blood Sugar -  na Checks Blood Sugar _____ times a day  Blood Thinner Instructions: Aspirin Instructions:na  Anesthesia review:  Cough,been to MD several times this month, been on antibiotics, states she is to pick up another one today.  Patient denies shortness of breath, fever, cough and chest pain at PAT appointment   Patient verbalized understanding of instructions that were given to them at the PAT appointment. Patient was also instructed that they will need to review over the PAT instructions again at home before surgery.

## 2018-11-12 NOTE — Progress Notes (Signed)
Anesthesia Chart Review:  Case:  [id number] Date/Time:  11/16/18 1439   Procedure:  LEFT TOTAL KNEE ARTHROPLASTY-CEMENTED (Left )   Anesthesia type:  Spinal   Pre-op diagnosis:  left knee osteoarthritis   Location:  [facility] ROOM 05 / [facility]   Surgeon:  Marybelle Killings, MD      DISCUSSION: 58 yo female current smoker. Pertinent hx includes Bipolar 1, Depression, OSA doesn't use CPAP, COPD, DOE, GERD, HTN, Anxiety.  I evaluated pt at [facility] due to recent history of respiratory infection. She reports she has had a cough and malaise for about 4 weeks, has been to her PCP multiple times, and has recently been started on her 3rd round of antibiotics. She has also been given IM and PO steroids. She reports today that her cough has improved, but she still doesn't feel well. Says she feels about "50%" of her baseline. Her baseline function is low, she is in a wheelchair today due to orthopedic pain and DOE. She is a poor historian. She says she does not know if she has COPD, but also says she was previously prescribed home O2 during hospitalization in 2018, but it was subsequently discontinued, unclear if by patient's own volition. She says she sleeps on 3 pillows at night, but also tells me she sleeps curled up on her side. She denies any orthopnea or PND. She denies any leg swelling. On exam she is afebrile, in NAD. Her lung sounds are distant but clear. She has very mild pedal edema. Heart RRR no M/R/G. EKG shows NSR. CXR with no active cardiopulmonary disease.  As pt was leaving appt she reported some epigastric pain, focal, located just below the sternum. Pain did not radiate, she denied any new SOB, no diaphoresis, no palpitations. She says she has GERD and the pain feels like "indigestion". She has not eaten today, has had 3 mountain dews. Requests something to drink, provided with coke. Immediately after starting to drink the coke the pt states her symptoms resolved. She was advised to be seen by PCP or ED if  symptoms worsen.  Last OV note from PCP dated 11/05/18 requested and reviewed. States pt being treated for COPD exacerbation, minimal details provided in note. By patient's own admission she is not back to her baseline. I called and discussed with Debbie at Dr. Lorin Mercy' office. Given recent COPD exacerbation pt will need to be cleared by her PCP prior to elective surgery.   At this time pt has not been cleared by PCP due to COPD exacerbation. Will be seen in followup for reassessment.  VS: BP 129/86 Comment: rechecked  Pulse 66   Temp 36.7 C   Resp (!) 22   Ht _0  (1.6 m)   Wt 101.6 kg   LMP 05/06/2012   SpO2 92%   BMI 39.68 kg/m   PROVIDERS: Adaline Sill, NP is PCP   LABS: Leukocytosis - pt has had recent IM and PO steroid for COPD exacerbation.  (all labs ordered are listed, but only abnormal results are displayed)  Labs Reviewed  CBC - Abnormal; Notable for the following components:      Result Value   WBC 18.8 (*)    Hemoglobin 15.1 (*)    HCT 46.6 (*)    All other components within normal limits  COMPREHENSIVE METABOLIC PANEL - Abnormal; Notable for the following components:   Potassium 3.2 (*)    Total Protein 6.3 (*)    All other components within  normal limits  SURGICAL PCR SCREEN  URINALYSIS, ROUTINE W REFLEX MICROSCOPIC     IMAGES: CHEST - 2 VIEW 11/11/18  COMPARISON:  10/27/2017  FINDINGS: There is new mild enlargement of the cardiac silhouette. No airspace consolidation, edema, pleural effusion, pneumothorax is identified. An old posterior right fourth rib fracture is again seen.  IMPRESSION: Mild cardiomegaly without evidence of active cardiopulmonary disease.    EKG: 11/11/2018: Normal sinus rhythm. Rate 66. RSR' or QR pattern in V1 suggests right ventricular conduction delay. Low voltage QRS  CV: TTE 12/23/2013: Study Conclusions  - Left ventricle: The cavity size was normal. Wall thickness was normal. Systolic function was  vigorous. The estimated ejection fraction was in the range of 65% to 70%. Wall motion was normal; there were no regional wall motion abnormalities. Doppler parameters are consistent with abnormal left ventricular relaxation (grade 1 diastolic dysfunction). Doppler parameters are consistent with high ventricular filling pressure. - Aortic valve: Mildly calcified annulus. Trileaflet. No significant regurgitation. - Mitral valve: Mildly thickened leaflets . Trivial regurgitation. - Right ventricle: The cavity size was mildly dilated. Systolic function was normal. - Right atrium: Central venous pressure: 84m Hg (est). - Tricuspid valve: Physiologic regurgitation. - Pulmonary arteries: Systolic pressure could not be accurately estimated. - Pericardium, extracardiac: A prominent pericardial fat pad was present. A trivial pericardial effusion was identified. Impressions:  - Normal LV chamber size and wall thickness with LVEF 601-09% grade 1 diastolic dysfunction with increased filling pressures. Mildly thickened mitral leaflets with trivial regurgitation. Unable to assess PASP. Probable trivial pericardial effusion, also prominent epicardial adipose tissue.  Past Medical History:  Diagnosis Date  . Anxiety   . Arthritis   . Bipolar 1 disorder ([facility]   . Chronic back pain   . Chronic knee pain   . COPD (chronic obstructive pulmonary disease) ([place]   . Depression   . Dyspnea   . GERD (gastroesophageal reflux disease)   . History of anemia   . Hypertension   . Lumbar radiculopathy   . On home oxygen therapy "6 years ago"   3LPM PRN: "had this 6 years ago," pt no longer has O2 at home (07/01/2017)  . Sleep apnea     Past Surgical History:  Procedure Laterality Date  . COLONOSCOPY WITH PROPOFOL N/A 07/18/2014   Procedure: COLONOSCOPY WITH PROPOFOL;  Surgeon: NRogene Houston MD;  Location: [facility];  Service: Endoscopy;  Laterality: N/A;  in cecum  at 0753, withdrawal time 20 min  . DILATION AND CURETTAGE OF UTERUS  age 58 . KNEE ARTHROSCOPY WITH MEDIAL MENISECTOMY Left 10/10/2016   Procedure: KNEE ARTHROSCOPY WITH MEDIAL MENISECTOMY REMOVAL LOSE BODY;  Surgeon: SCarole Civil MD;  Location: [facility];  Service: Orthopedics;  Laterality: Left;  . POLYPECTOMY  05/22/2012   Procedure: POLYPECTOMY;  Surgeon: JJonnie Kind MD;  Location: [facility];  Service: Gynecology;  Laterality: N/A;  Endometrial Polypectomy  . POLYPECTOMY N/A 07/18/2014   Procedure: POLYPECTOMY;  Surgeon: NRogene Houston MD;  Location: [facility];  Service: Endoscopy;  Laterality: N/A;  . TUBAL LIGATION      MEDICATIONS: . amLODipine (NORVASC) 10 MG tablet  . budesonide-formoterol (SYMBICORT) 160-4.5 MCG/ACT inhaler  . buPROPion (WELLBUTRIN XL) 300 MG 24 hr tablet  . busPIRone (BUSPAR) 30 MG tablet  . clonazePAM (KLONOPIN) 0.5 MG tablet  . cyclobenzaprine (FLEXERIL) 10 MG tablet  . docusate sodium (COLACE) 100 MG capsule  . doxepin (SINEQUAN) 75 MG capsule  . DULoxetine (CYMBALTA) 60 MG capsule  .  fluticasone (FLONASE) 50 MCG/ACT nasal spray  . ipratropium-albuterol (DUONEB) 0.5-2.5 (3) MG/3ML SOLN  . lidocaine (LIDODERM) 5 %  . metoprolol tartrate (LOPRESSOR) 25 MG tablet  . NARCAN 4 MG/0.1ML LIQD nasal spray kit  . omeprazole (PRILOSEC) 20 MG capsule  . oxybutynin (DITROPAN) 5 MG tablet  . risperiDONE (RISPERDAL) 3 MG tablet  . VOLTAREN 1 % GEL   No current facility-administered medications for this encounter.     Wynonia Musty [facility] Short Stay Center/Anesthesiology Phone [phone] 11/13/2018 1:38 PM

## 2018-11-13 ENCOUNTER — Telehealth (INDEPENDENT_AMBULATORY_CARE_PROVIDER_SITE_OTHER): Payer: Self-pay | Admitting: Orthopaedic Surgery

## 2018-11-13 NOTE — Telephone Encounter (Signed)
Patient's left total knee surgery for Monday March 9th has been cancelled.  PCP unable to clear her from a medical standpoint at this time.  Patient is currently on antibiotics and steroids for acute COPD exacerbation. Patient is to follow up with Sharlyne Cai, NP next week. Post op appointment in [place] cancelled.  Central scheduling, anesthesia short stay, and [facility] notified.

## 2018-11-13 NOTE — Telephone Encounter (Signed)
fyi

## 2018-11-13 NOTE — Telephone Encounter (Signed)
thanks

## 2018-11-16 ENCOUNTER — Encounter (HOSPITAL_COMMUNITY): Admission: RE | Payer: Self-pay | Source: Ambulatory Visit

## 2018-11-16 ENCOUNTER — Inpatient Hospital Stay (HOSPITAL_COMMUNITY): Admission: RE | Admit: 2018-11-16 | Payer: Medicaid Other | Source: Ambulatory Visit | Admitting: Orthopaedic Surgery

## 2018-11-16 SURGERY — ARTHROPLASTY, KNEE, TOTAL
Anesthesia: Spinal | Laterality: Left

## 2018-11-30 ENCOUNTER — Emergency Department (HOSPITAL_COMMUNITY)
Admission: EM | Admit: 2018-11-30 | Discharge: 2018-11-30 | Disposition: A | Discharge status: Home / Self Care | Payer: Medicaid Other | Attending: Emergency Medicine | Admitting: Emergency Medicine

## 2018-11-30 ENCOUNTER — Other Ambulatory Visit: Payer: Self-pay

## 2018-11-30 ENCOUNTER — Emergency Department (HOSPITAL_COMMUNITY): Payer: Medicaid Other

## 2018-11-30 ENCOUNTER — Encounter (HOSPITAL_COMMUNITY): Payer: Self-pay | Admitting: Emergency Medicine

## 2018-11-30 DIAGNOSIS — S300XXA Contusion of lower back and pelvis, initial encounter: Secondary | ICD-10-CM | POA: Diagnosis not present

## 2018-11-30 DIAGNOSIS — W19XXXD Unspecified fall, subsequent encounter: Secondary | ICD-10-CM

## 2018-11-30 DIAGNOSIS — M25511 Pain in right shoulder: Secondary | ICD-10-CM | POA: Insufficient documentation

## 2018-11-30 DIAGNOSIS — S3992XA Unspecified injury of lower back, initial encounter: Secondary | ICD-10-CM | POA: Diagnosis present

## 2018-11-30 DIAGNOSIS — S300XXD Contusion of lower back and pelvis, subsequent encounter: Secondary | ICD-10-CM

## 2018-11-30 DIAGNOSIS — F17210 Nicotine dependence, cigarettes, uncomplicated: Secondary | ICD-10-CM | POA: Diagnosis not present

## 2018-11-30 DIAGNOSIS — Y999 Unspecified external cause status: Secondary | ICD-10-CM | POA: Diagnosis not present

## 2018-11-30 DIAGNOSIS — S46919D Strain of unspecified muscle, fascia and tendon at shoulder and upper arm level, unspecified arm, subsequent encounter: Secondary | ICD-10-CM

## 2018-11-30 DIAGNOSIS — Z79899 Other long term (current) drug therapy: Secondary | ICD-10-CM | POA: Diagnosis not present

## 2018-11-30 DIAGNOSIS — Y9389 Activity, other specified: Secondary | ICD-10-CM | POA: Diagnosis not present

## 2018-11-30 DIAGNOSIS — J449 Chronic obstructive pulmonary disease, unspecified: Secondary | ICD-10-CM | POA: Diagnosis not present

## 2018-11-30 DIAGNOSIS — W1789XA Other fall from one level to another, initial encounter: Secondary | ICD-10-CM | POA: Insufficient documentation

## 2018-11-30 DIAGNOSIS — I10 Essential (primary) hypertension: Secondary | ICD-10-CM | POA: Insufficient documentation

## 2018-11-30 DIAGNOSIS — Y929 Unspecified place or not applicable: Secondary | ICD-10-CM | POA: Diagnosis not present

## 2018-11-30 DIAGNOSIS — M25512 Pain in left shoulder: Secondary | ICD-10-CM | POA: Insufficient documentation

## 2018-11-30 MED ORDER — NAPROXEN 500 MG PO TABS: 500.0000 mg | ORAL_TABLET | Freq: Two times a day (BID) | ORAL | 0 refills | Status: DC

## 2018-11-30 MED ORDER — HYDROCODONE-ACETAMINOPHEN 5-325 MG PO TABS: 1.0000 | ORAL_TABLET | ORAL | 0 refills | Status: DC | PRN

## 2018-11-30 MED ORDER — HYDROCODONE-ACETAMINOPHEN 5-325 MG PO TABS
1.0000 | ORAL_TABLET | Freq: Once | ORAL | Status: AC
  Administered 2018-11-30: 1 via ORAL
  Filled 2018-11-30: qty 1

## 2018-11-30 NOTE — ED Provider Notes (Signed)
[facility] EMERGENCY DEPARTMENT Provider Note   CSN: [id number] Arrival date & time: 11/30/18  1203    History   Chief Complaint Chief Complaint  Patient presents with  . Fall    HPI Tiffany Lynch is a 58 y.o. female with a history as outlined below, most significant for hypertension, chronic pain including back pain and knee pain, advanced osteoarthritis of the left knee in which she was scheduled for surgery on March 9, which had to be postponed secondary to recent COPD exacerbation and could not be cleared medically, now surgery postponed indefinitely secondary to current community Covid 19 presenting with persistent pain in the left knee, but endorses coccyx pain and bilateral shoulder pain from a fall sustained 9 days ago.  She was attempting to sit in a chair which collapsed, causing fall onto her coccyx region but also reports bilateral shoulder pain since attempting to catch her fall by grabbing surrounding chairs.  She was seen by her pcp and received a toradol injection without improvement in pain.  Patient was in chronic pain management but had her services temporarily suspended as they wanted her to be opiate nave in anticipation of her knee surgery.  She has taken Tylenol without improvement in her symptoms.  The history is provided by the patient.    Past Medical History:  Diagnosis Date  . Anxiety   . Arthritis   . Bipolar 1 disorder ([place])   . Chronic back pain   . Chronic knee pain   . COPD (chronic obstructive pulmonary disease) ([place])   . Depression   . Dyspnea   . GERD (gastroesophageal reflux disease)   . History of anemia   . Hypertension   . Lumbar radiculopathy   . On home oxygen therapy "6 years ago"   3LPM PRN: "had this 6 years ago," pt no longer has O2 at home (07/01/2017)  . Sleep apnea     Patient Active Problem List   Diagnosis Date Noted  . Rib fractures 07/01/2017  . Tobacco dependence 07/01/2017  . Derangement of posterior horn of  medial meniscus of left knee   . Loose body of left knee   . Hypotension 08/14/2016  . Respiratory failure with hypoxia ([place]) 08/14/2016  . Hyperglycemia 08/14/2016  . Alcohol abuse 08/14/2016  . Cocaine abuse ([place]) 08/14/2016  . Chronic pain 08/14/2016  . Oral thrush 08/14/2016  . Hypokalemia 05/31/2016  . Pre-syncope 05/31/2016  . Current smoker 07/20/2015  . Urge and stress incontinence 07/14/2015  . Hyperlipidemia 01/02/2015  . Insomnia 01/02/2015  . OAB (overactive bladder) 01/02/2015  . Depression 01/02/2015  . GERD (gastroesophageal reflux disease) 01/02/2015  . ARF (acute renal failure) ([place]) 12/21/2013  . COPD (chronic obstructive pulmonary disease) ([place]) 05/22/2012  . Dysfunctional uterine bleeding 04/01/2012  . Orthostatic hypotension 03/31/2012  . Encephalopathy acute 03/30/2012  . Chronic back pain   . Bipolar 1 disorder ([place])   . Anxiety     Past Surgical History:  Procedure Laterality Date  . COLONOSCOPY WITH PROPOFOL N/A 07/18/2014   Procedure: COLONOSCOPY WITH PROPOFOL;  Surgeon: Rogene Houston, MD;  Location: [facility];  Service: Endoscopy;  Laterality: N/A;  in cecum at 0753, withdrawal time 20 min  . DILATION AND CURETTAGE OF UTERUS  age 32  . KNEE ARTHROSCOPY WITH MEDIAL MENISECTOMY Left 10/10/2016   Procedure: KNEE ARTHROSCOPY WITH MEDIAL MENISECTOMY REMOVAL LOSE BODY;  Surgeon: Carole Civil, MD;  Location: [facility];  Service: Orthopedics;  Laterality: Left;  .  POLYPECTOMY  05/22/2012   Procedure: POLYPECTOMY;  Surgeon: Jonnie Kind, MD;  Location: [facility];  Service: Gynecology;  Laterality: N/A;  Endometrial Polypectomy  . POLYPECTOMY N/A 07/18/2014   Procedure: POLYPECTOMY;  Surgeon: Rogene Houston, MD;  Location: [facility];  Service: Endoscopy;  Laterality: N/A;  . TUBAL LIGATION       OB History    Gravida  2   Para  2   Term  2   Preterm      AB      Living  2     SAB      TAB      Ectopic      Multiple      Live Births                Home Medications    Prior to Admission medications   Medication Sig Start Date End Date Taking? Authorizing Provider  amLODipine (NORVASC) 10 MG tablet Take 10 mg by mouth daily. 07/02/18   [provider]  budesonide-formoterol (SYMBICORT) 160-4.5 MCG/ACT inhaler Inhale 2 puffs into the lungs 2 (two) times daily.    [provider]  buPROPion (WELLBUTRIN XL) 300 MG 24 hr tablet Take 300 mg by mouth every morning.    [provider]  busPIRone (BUSPAR) 30 MG tablet Take 15 mg by mouth 3 (three) times daily.  10/03/17   [provider]  clonazePAM (KLONOPIN) 0.5 MG tablet Take 0.5 mg by mouth 3 (three) times daily.  05/29/18   [provider]  cyclobenzaprine (FLEXERIL) 10 MG tablet TAKE 1 TABLET BY MOUTH THREE TIMES A DAY AS NEEDED FOR MUSCLE SPASMS Patient taking differently: Take 10 mg by mouth 3 (three) times daily as needed for muscle spasms.  04/26/18   Carole Civil, MD  docusate sodium (COLACE) 100 MG capsule Take 1 capsule (100 mg total) by mouth 2 (two) times daily. Patient not taking: Reported on 07/16/2018 07/02/17   Murlean Iba, MD  doxepin (SINEQUAN) 75 MG capsule Take 75 mg by mouth at bedtime.  05/27/18   [provider]  DULoxetine (CYMBALTA) 60 MG capsule Take 60 mg by mouth every morning.     [provider]  fluticasone (FLONASE) 50 MCG/ACT nasal spray Place 1 spray into both nostrils daily.    [provider]  HYDROcodone-acetaminophen (NORCO/VICODIN) 5-325 MG tablet Take 1 tablet by mouth every 4 (four) hours as needed. 11/30/18   Evalee Jefferson, PA-C  ipratropium-albuterol (DUONEB) 0.5-2.5 (3) MG/3ML SOLN Take 3 mLs by nebulization every 6 (six) hours as needed. Patient not taking: Reported on 07/16/2018 07/02/17   Irwin Brakeman L, MD  lidocaine (LIDODERM) 5 % Place 1 patch onto the skin daily. Remove & Discard patch within 12 hours or as directed by MD Patient not taking: Reported  on 11/11/2018 07/02/17   Murlean Iba, MD  metoprolol tartrate (LOPRESSOR) 25 MG tablet Take 25 mg by mouth 2 (two) times daily. 06/16/17   [provider]  naproxen (NAPROSYN) 500 MG tablet Take 1 tablet (500 mg total) by mouth 2 (two) times daily. 11/30/18   Evalee Jefferson, PA-C  NARCAN 4 MG/0.1ML LIQD nasal spray kit Place 1 spray into the nose as needed (accidental overdose).  04/09/18   [provider]  omeprazole (PRILOSEC) 20 MG capsule Take 20 mg by mouth daily. 07/29/16   [provider]  oxybutynin (DITROPAN) 5 MG tablet Take 5 mg by mouth 2 (two) times daily.  10/22/17   [provider]  risperiDONE (RISPERDAL) 3 MG tablet Take 3 mg by mouth at bedtime. 05/23/16   [provider]  VOLTAREN 1 % GEL Apply 1 g topically 4 (four) times daily as needed for pain. 09/16/16   [provider]  paliperidone (INVEGA) 6 MG 24 hr tablet Take 6 mg by mouth daily.    11/29/11  [provider]    Family History Family History  Problem Relation Age of Onset  . Hypertension Mother   . Hypertension Brother   . Anxiety disorder Brother   . Depression Brother     Social History Social History   Tobacco Use  . Smoking status: Current Every Day Smoker    Packs/day: 1.00    Years: 41.00    Pack years: 41.00    Types: Cigarettes  . Smokeless tobacco: Never Used  Substance Use Topics  . Alcohol use: Yes    Alcohol/week: 2.0 - 3.0 standard drinks    Types: 2 - 3 Glasses of wine per week    Comment: drinks wine several times a week  . Drug use: Yes    Frequency: 3.0 times per week    Types: Marijuana    Comment: history of cocaine and marijuana use, reports no use in about 6 months     Allergies   Patient has no known allergies.   Review of Systems Review of Systems  Constitutional: Negative for fever.  Musculoskeletal: Positive for arthralgias. Negative for joint swelling and myalgias.  Skin: Negative.   Neurological: Negative  for weakness and numbness.     Physical Exam Updated Vital Signs BP (!) 143/100 (BP Location: Right Arm)   Pulse 100   Temp 98.1 F (36.7 C) (Oral)   Resp 20   Ht 5' 3"  (1.6 m)   Wt 102 kg   LMP 05/06/2012   SpO2 95%   BMI 39.83 kg/m   Physical Exam Vitals signs reviewed.  Constitutional:      Appearance: She is well-developed.  HENT:     Head: Atraumatic.  Neck:     Musculoskeletal: Normal range of motion.  Cardiovascular:     Pulses:          Radial pulses are 2+ on the right side and 2+ on the left side.     Comments: Pulses equal bilaterally Musculoskeletal:        General: Tenderness present. No swelling or deformity.     Right shoulder: She exhibits bony tenderness. She exhibits no swelling, no effusion and no crepitus.     Left shoulder: She exhibits bony tenderness. She exhibits no swelling, no effusion and no crepitus.     Lumbar back: She exhibits bony tenderness. She exhibits no swelling and no edema.       Back:     Comments: ttp midline coccygeal region without erythema or fluctuance, no induration or deformity.  Bilateral anterior shoulder pain across anterior humeral heads.  No deformity appreciated.   Skin:    General: Skin is warm and dry.  Neurological:     Mental Status: She is alert.     Sensory: No sensory deficit.     Deep Tendon Reflexes: Reflexes normal.  Psychiatric:     Comments: Appears anxious.      ED Treatments / Results  Labs (all labs ordered are listed, but only abnormal results are displayed) Labs Reviewed - No data to display  EKG None  Radiology Dg Sacrum/coccyx  Result Date:  11/30/2018 CLINICAL DATA:  Fall several weeks ago with persistent pelvic pain, initial encounter EXAM: SACRUM AND COCCYX - 2+ VIEW COMPARISON:  03/22/2018 FINDINGS: Pelvic ring as visualized is intact. No definitive sacral fracture is seen. No soft tissue abnormality is noted. Mild degenerative changes in the lower lumbar spine are noted.  IMPRESSION: No acute abnormality noted. Electronically Signed   By: Inez Catalina M.D.   On: 11/30/2018 13:50   Dg Shoulder Right  Result Date: 11/30/2018 CLINICAL DATA:  Fall several weeks ago with persistent shoulder pain, initial encounter EXAM: RIGHT SHOULDER - 2+ VIEW COMPARISON:  03/22/2018 FINDINGS: Degenerative changes of the glenohumeral articulation are seen. Old right third rib fracture is noted anterior laterally. No acute fracture or dislocation is noted. IMPRESSION: Degenerative change without acute abnormality. Electronically Signed   By: Inez Catalina M.D.   On: 11/30/2018 13:48   Dg Shoulder Left  Result Date: 11/30/2018 CLINICAL DATA:  Fall several weeks ago with persistent left shoulder pain, initial encounter EXAM: LEFT SHOULDER - 2+ VIEW COMPARISON:  03/22/2018 FINDINGS: Mild degenerative changes of the acromioclavicular joint and glenohumeral articulation are noted. No acute fracture or dislocation is seen. No soft tissue abnormality is noted. IMPRESSION: Degenerative change without acute abnormality. Electronically Signed   By: Inez Catalina M.D.   On: 11/30/2018 13:50    Procedures Procedures (including critical care time)  Medications Ordered in ED Medications  HYDROcodone-acetaminophen (NORCO/VICODIN) 5-325 MG per tablet 1 tablet (1 tablet Oral Given 11/30/18 1347)     Initial Impression / Assessment and Plan / ED Course  I have reviewed the triage vital signs and the nursing notes.  Pertinent labs & imaging results that were available during my care of the patient were reviewed by me and considered in my medical decision making (see chart for details).        Narcotic database was reviewed.  Her last prescription through her pain specialist was in November, [place].  She has had 3 prescriptions for Tussionex cough syrup, last dating February 27 which was a 10-day supply.  Imaging negative for acute injury, suspect deep coccygeal contusion and acute on chronic  shoulder strain.  Imaging of shoulders positive for chronic arthritis changes. She was prescribed naproxen, #10 hydrocodone tabs.  She has f/u with her orthopedist in 2 days. Discussed other home tx including heat tx, donut pillow.    Final Clinical Impressions(s) / ED Diagnoses   Final diagnoses:  Fall, subsequent encounter  Coccygeal contusion, subsequent encounter  Shoulder strain, unspecified laterality, subsequent encounter    ED Discharge Orders         Ordered    HYDROcodone-acetaminophen (NORCO/VICODIN) 5-325 MG tablet  Every 4 hours PRN     11/30/18 1421    naproxen (NAPROSYN) 500 MG tablet  2 times daily     11/30/18 1421           Evalee Jefferson, Hershal Coria 11/30/18 1455    Maudie Flakes, MD 12/02/18 1128

## 2018-11-30 NOTE — ED Triage Notes (Signed)
Patient states she sat in a chair and it broke 9 days ago. Complaining of pain to "tailbone" and bilateral arms. Denies head injury or LOC.

## 2018-11-30 NOTE — Discharge Instructions (Addendum)
Your x-rays today are negative for any acute injuries from your fall.  You may use the medications prescribed for pain and inflammation.  I also suggested a heating pad 20 minutes several times daily to your areas of pain.  Follow-up with your primary doctor or your orthopedic MD as planned for further evaluation and management of your symptoms.

## 2018-12-10 NOTE — Progress Notes (Signed)
58 year old female comes in today for preop evaluation for left total knee replacement scheduled November 16, 2018.  Patient states that she has been having ongoing issues with cough, wheezing and dyspnea x1 month.  In the last 3 weeks she has been treated with 2 antibiotics.  Complains of having fever and chills along with diarrhea.  No nausea vomiting.  Recommended patient return back to her primary care physician for medical clearance.  We will help arrange this.

## 2019-01-14 ENCOUNTER — Ambulatory Visit (INDEPENDENT_AMBULATORY_CARE_PROVIDER_SITE_OTHER): Payer: Medicaid Other | Admitting: Orthopaedic Surgery

## 2019-01-14 ENCOUNTER — Other Ambulatory Visit: Payer: Self-pay

## 2019-01-14 ENCOUNTER — Encounter: Payer: Self-pay | Admitting: Orthopaedic Surgery

## 2019-01-14 VITALS — Ht 63.0 in | Wt 224.0 lb

## 2019-01-14 DIAGNOSIS — M1712 Unilateral primary osteoarthritis, left knee: Secondary | ICD-10-CM | POA: Diagnosis not present

## 2019-01-14 NOTE — Progress Notes (Signed)
 Office Visit Note   Patient: Tiffany Lynch           Date of Birth: 08/10/1961           MRN: [id number] Visit Date: 01/14/2019              Requested by: Dickey, Kirkland M, NP [address] [address] [place], [place] [address] PCP: Dickey, Kirkland M, NP   Assessment & Plan: Visit Diagnoses:  1. Unilateral primary osteoarthritis, left knee     Plan: We discussed possibly injecting the knee but she decided not to since we reviewed how long she needs to wait after the injection before proceeding with total knee arthroplasty due to increased risk of infection.  Once elective totals are being performed we will proceed with surgical scheduling.  Questions were elicited and answered.  We discussed his best avoid narcotic medication before surgery so that her pain medication will be effective after the surgery.    Follow-Up Instructions: No follow-ups on file.   Orders:  No orders of the defined types were placed in this encounter.  No orders of the defined types were placed in this encounter.     Procedures: No procedures performed   Clinical Data: No additional findings.   Subjective: Chief Complaint  Patient presents with  . Left Knee - Pain    HPI 58-year-old female returns with ongoing problems with severe left knee pain.  Previous x-ray showed large osteochondral defect medial femoral condyle with secondary osteoarthritis some varus deformity marginal osteophytes medial and lateral as well as patellofemoral.  She is able to use a walker for ambulation.  She has been in pain management for the last 2 years and last prescription was for Belbuca 300mcg #60 which was on 12/02/2019 film twice daily.  She still takes clonazepam 1 mg 3 times daily.  Patient's been approved for total knee arthroplasty and is awaiting lifting of coronavirus elective surgery restrictions.  Patient is using a walker she is failed injections anti-inflammatories which are limited due to her past renal problems as  well as GERD.  Review of Systems positive for COPD still current smoker.  History of hyperlipidemia past alcohol abuse, cocaine abuse, pain clinic x2 years.  Bipolar disorder hyperglycemia dyspnea obesity.  Previous left knee arthroscopy medial meniscectomy 2018.  History of renal failure last seen met 11/11/2018 BUN creatinine normal.  Positive for depression urge and stress incontinence.Otherwise neg as it pertains to HPI.   Objective: Vital Signs: Ht 5' 3" (1.6 m)   Wt 224 lb (101.6 kg)   LMP 05/06/2012   BMI 39.68 kg/m   Physical Exam Constitutional:      Appearance: She is well-developed.  HENT:     Head: Normocephalic.     Right Ear: External ear normal.     Left Ear: External ear normal.  Eyes:     Pupils: Pupils are equal, round, and reactive to light.  Neck:     Thyroid: No thyromegaly.     Trachea: No tracheal deviation.  Cardiovascular:     Rate and Rhythm: Normal rate.  Pulmonary:     Effort: Pulmonary effort is normal.  Abdominal:     Palpations: Abdomen is soft.  Skin:    General: Skin is warm and dry.  Neurological:     Mental Status: She is alert and oriented to person, place, and time.  Psychiatric:        Behavior: Behavior normal.     Ortho Exam patient   has full extension flexion 110 medial joint line tenderness 2+ knee effusion amatory with a pronounced left knee limp no pain with hip range of motion distal pulses are intact.  Specialty Comments:  No specialty comments available.  Imaging: No results found.   PMFS History: Patient Active Problem List   Diagnosis Date Noted  . Rib fractures 07/01/2017  . Tobacco dependence 07/01/2017  . Derangement of posterior horn of medial meniscus of left knee   . Loose body of left knee   . Hypotension 08/14/2016  . Respiratory failure with hypoxia (HCC) 08/14/2016  . Hyperglycemia 08/14/2016  . Alcohol abuse 08/14/2016  . Cocaine abuse (HCC) 08/14/2016  . Chronic pain 08/14/2016  . Oral thrush  08/14/2016  . Hypokalemia 05/31/2016  . Pre-syncope 05/31/2016  . Current smoker 07/20/2015  . Urge and stress incontinence 07/14/2015  . Hyperlipidemia 01/02/2015  . Insomnia 01/02/2015  . OAB (overactive bladder) 01/02/2015  . Depression 01/02/2015  . GERD (gastroesophageal reflux disease) 01/02/2015  . ARF (acute renal failure) (HCC) 12/21/2013  . COPD (chronic obstructive pulmonary disease) (HCC) 05/22/2012  . Dysfunctional uterine bleeding 04/01/2012  . Orthostatic hypotension 03/31/2012  . Encephalopathy acute 03/30/2012  . Chronic back pain   . Bipolar 1 disorder (HCC)   . Anxiety    Past Medical History:  Diagnosis Date  . Anxiety   . Arthritis   . Bipolar 1 disorder (HCC)   . Chronic back pain   . Chronic knee pain   . COPD (chronic obstructive pulmonary disease) (HCC)   . Depression   . Dyspnea   . GERD (gastroesophageal reflux disease)   . History of anemia   . Hypertension   . Lumbar radiculopathy   . On home oxygen therapy "6 years ago"   3LPM PRN: "had this 6 years ago," pt no longer has O2 at home (07/01/2017)  . Sleep apnea     Family History  Problem Relation Age of Onset  . Hypertension Mother   . Hypertension Brother   . Anxiety disorder Brother   . Depression Brother     Past Surgical History:  Procedure Laterality Date  . COLONOSCOPY WITH PROPOFOL N/A 07/18/2014   Procedure: COLONOSCOPY WITH PROPOFOL;  Surgeon: Najeeb U Rehman, MD;  Location: [facility];  Service: Endoscopy;  Laterality: N/A;  in cecum at 0753, withdrawal time 20 min  . DILATION AND CURETTAGE OF UTERUS  age 21  . KNEE ARTHROSCOPY WITH MEDIAL MENISECTOMY Left 10/10/2016   Procedure: KNEE ARTHROSCOPY WITH MEDIAL MENISECTOMY REMOVAL LOSE BODY;  Surgeon: Stanley E Harrison, MD;  Location: [facility];  Service: Orthopedics;  Laterality: Left;  . POLYPECTOMY  05/22/2012   Procedure: POLYPECTOMY;  Surgeon: John V Ferguson, MD;  Location: [facility];  Service: Gynecology;  Laterality: N/A;   Endometrial Polypectomy  . POLYPECTOMY N/A 07/18/2014   Procedure: POLYPECTOMY;  Surgeon: Najeeb U Rehman, MD;  Location: [facility];  Service: Endoscopy;  Laterality: N/A;  . TUBAL LIGATION     Social History   Occupational History  . Occupation: disabled  Tobacco Use  . Smoking status: Current Every Day Smoker    Packs/day: 1.00    Years: 41.00    Pack years: 41.00    Types: Cigarettes  . Smokeless tobacco: Never Used  Substance and Sexual Activity  . Alcohol use: Yes    Alcohol/week: 2.0 - 3.0 standard drinks    Types: 2 - 3 Glasses of wine per week    Comment: drinks wine several times   a week  . Drug use: Yes    Frequency: 3.0 times per week    Types: Marijuana    Comment: history of cocaine and marijuana use, reports no use in about 6 months  . Sexual activity: Yes    Birth control/protection: Surgical    Comment: tubes tied

## 2019-01-14 NOTE — H&P (View-Only) (Signed)
 Office Visit Note   Patient: Tiffany Lynch           Date of Birth: 08/24/1961           MRN: [id number] Visit Date: 01/14/2019              Requested by: Dickey, Kirkland M, NP [address] [address] [place], [place] [address] PCP: Dickey, Kirkland M, NP   Assessment & Plan: Visit Diagnoses:  1. Unilateral primary osteoarthritis, left knee     Plan: We discussed possibly injecting the knee but she decided not to since we reviewed how long she needs to wait after the injection before proceeding with total knee arthroplasty due to increased risk of infection.  Once elective totals are being performed we will proceed with surgical scheduling.  Questions were elicited and answered.  We discussed his best avoid narcotic medication before surgery so that her pain medication will be effective after the surgery.    Follow-Up Instructions: No follow-ups on file.   Orders:  No orders of the defined types were placed in this encounter.  No orders of the defined types were placed in this encounter.     Procedures: No procedures performed   Clinical Data: No additional findings.   Subjective: Chief Complaint  Patient presents with  . Left Knee - Pain    HPI 58-year-old female returns with ongoing problems with severe left knee pain.  Previous x-ray showed large osteochondral defect medial femoral condyle with secondary osteoarthritis some varus deformity marginal osteophytes medial and lateral as well as patellofemoral.  She is able to use a walker for ambulation.  She has been in pain management for the last 2 years and last prescription was for Belbuca 300mcg #60 which was on 12/02/2019 film twice daily.  She still takes clonazepam 1 mg 3 times daily.  Patient's been approved for total knee arthroplasty and is awaiting lifting of coronavirus elective surgery restrictions.  Patient is using a walker she is failed injections anti-inflammatories which are limited due to her past renal problems as  well as GERD.  Review of Systems positive for COPD still current smoker.  History of hyperlipidemia past alcohol abuse, cocaine abuse, pain clinic x2 years.  Bipolar disorder hyperglycemia dyspnea obesity.  Previous left knee arthroscopy medial meniscectomy 2018.  History of renal failure last seen met 11/11/2018 BUN creatinine normal.  Positive for depression urge and stress incontinence.Otherwise neg as it pertains to HPI.   Objective: Vital Signs: Ht 5' 3" (1.6 m)   Wt 224 lb (101.6 kg)   LMP 05/06/2012   BMI 39.68 kg/m   Physical Exam Constitutional:      Appearance: She is well-developed.  HENT:     Head: Normocephalic.     Right Ear: External ear normal.     Left Ear: External ear normal.  Eyes:     Pupils: Pupils are equal, round, and reactive to light.  Neck:     Thyroid: No thyromegaly.     Trachea: No tracheal deviation.  Cardiovascular:     Rate and Rhythm: Normal rate.  Pulmonary:     Effort: Pulmonary effort is normal.  Abdominal:     Palpations: Abdomen is soft.  Skin:    General: Skin is warm and dry.  Neurological:     Mental Status: She is alert and oriented to person, place, and time.  Psychiatric:        Behavior: Behavior normal.     Ortho Exam patient   has full extension flexion 110 medial joint line tenderness 2+ knee effusion amatory with a pronounced left knee limp no pain with hip range of motion distal pulses are intact.  Specialty Comments:  No specialty comments available.  Imaging: No results found.   PMFS History: Patient Active Problem List   Diagnosis Date Noted  . Rib fractures 07/01/2017  . Tobacco dependence 07/01/2017  . Derangement of posterior horn of medial meniscus of left knee   . Loose body of left knee   . Hypotension 08/14/2016  . Respiratory failure with hypoxia (HCC) 08/14/2016  . Hyperglycemia 08/14/2016  . Alcohol abuse 08/14/2016  . Cocaine abuse (HCC) 08/14/2016  . Chronic pain 08/14/2016  . Oral thrush  08/14/2016  . Hypokalemia 05/31/2016  . Pre-syncope 05/31/2016  . Current smoker 07/20/2015  . Urge and stress incontinence 07/14/2015  . Hyperlipidemia 01/02/2015  . Insomnia 01/02/2015  . OAB (overactive bladder) 01/02/2015  . Depression 01/02/2015  . GERD (gastroesophageal reflux disease) 01/02/2015  . ARF (acute renal failure) (HCC) 12/21/2013  . COPD (chronic obstructive pulmonary disease) (HCC) 05/22/2012  . Dysfunctional uterine bleeding 04/01/2012  . Orthostatic hypotension 03/31/2012  . Encephalopathy acute 03/30/2012  . Chronic back pain   . Bipolar 1 disorder (HCC)   . Anxiety    Past Medical History:  Diagnosis Date  . Anxiety   . Arthritis   . Bipolar 1 disorder (HCC)   . Chronic back pain   . Chronic knee pain   . COPD (chronic obstructive pulmonary disease) (HCC)   . Depression   . Dyspnea   . GERD (gastroesophageal reflux disease)   . History of anemia   . Hypertension   . Lumbar radiculopathy   . On home oxygen therapy "6 years ago"   3LPM PRN: "had this 6 years ago," pt no longer has O2 at home (07/01/2017)  . Sleep apnea     Family History  Problem Relation Age of Onset  . Hypertension Mother   . Hypertension Brother   . Anxiety disorder Brother   . Depression Brother     Past Surgical History:  Procedure Laterality Date  . COLONOSCOPY WITH PROPOFOL N/A 07/18/2014   Procedure: COLONOSCOPY WITH PROPOFOL;  Surgeon: Najeeb U Rehman, MD;  Location: [facility];  Service: Endoscopy;  Laterality: N/A;  in cecum at 0753, withdrawal time 20 min  . DILATION AND CURETTAGE OF UTERUS  age 21  . KNEE ARTHROSCOPY WITH MEDIAL MENISECTOMY Left 10/10/2016   Procedure: KNEE ARTHROSCOPY WITH MEDIAL MENISECTOMY REMOVAL LOSE BODY;  Surgeon: Stanley E Harrison, MD;  Location: [facility];  Service: Orthopedics;  Laterality: Left;  . POLYPECTOMY  05/22/2012   Procedure: POLYPECTOMY;  Surgeon: John V Ferguson, MD;  Location: [facility];  Service: Gynecology;  Laterality: N/A;   Endometrial Polypectomy  . POLYPECTOMY N/A 07/18/2014   Procedure: POLYPECTOMY;  Surgeon: Najeeb U Rehman, MD;  Location: [facility];  Service: Endoscopy;  Laterality: N/A;  . TUBAL LIGATION     Social History   Occupational History  . Occupation: disabled  Tobacco Use  . Smoking status: Current Every Day Smoker    Packs/day: 1.00    Years: 41.00    Pack years: 41.00    Types: Cigarettes  . Smokeless tobacco: Never Used  Substance and Sexual Activity  . Alcohol use: Yes    Alcohol/week: 2.0 - 3.0 standard drinks    Types: 2 - 3 Glasses of wine per week    Comment: drinks wine several times   a week  . Drug use: Yes    Frequency: 3.0 times per week    Types: Marijuana    Comment: history of cocaine and marijuana use, reports no use in about 6 months  . Sexual activity: Yes    Birth control/protection: Surgical    Comment: tubes tied       

## 2019-02-08 NOTE — Pre-Procedure Instructions (Addendum)
VIANNY SCHRAEDER  02/08/2019      [place], [place] - [address] [place] [address] Phone: [phone] Fax: [phone]  [id number] - [place], [place] [address] Phone: [phone] Fax: [phone]    Your procedure is scheduled on Wednesday 02-10-19   Report to [facility] Admitting at 1030A.M.  Call this number if you have problems the morning of surgery:  [phone]   Remember:  Do not eat  after midnight.  You may drink clear liquids until  0930am.  Clear liquids allowed are:  Water, Juice (non-citric and without pulp), Carbonated beverages, Clear Tea, Black Coffee only, Plain Jell-O only, Gatorade and Plain Popsicles only   Please complete your PRE-SURGERY ENSURE that was provided to you 3 hours prior to you surgery start time 0930 am).  Please, if able, drink it in one setting. DO NOT SIP.           Take these medicines the morning of surgery with A SIP OF WATER     Albuterol (PROVENTIL)     AmLODipine (NORVASC)     BuPROPion (WELLBUTRIN XL)     BusPIRone (BUSPAR)     ClonazePAM (KLONOPIN)     DULoxetine (CYMBALTA)     Fluticasone-Umeclidin-Vilant (TRELEGY ELLIPTA)     Metoprolol tartrate (LOPRESSOR)     Nitrofurantoin (MACRODANTIN)     Omeprazole (PRILOSEC)     Oxybutynin (DITROPAN-XL)     Olopatadine HCl (PAZEO) eye drops  As   needed:      Acetaminophen (TYLENOL)  Bring your inhaler with you on day of surgery.  As of today, STOP taking BELBUCA,any Aspirin (unless otherwise instructed by your surgeon), Aleve, Naproxen, Ibuprofen, Motrin, Advil, Goody's, BC's, all herbal medications, fish oil, and all vitamins.    Do not wear jewelry, make-up or nail polish.  Do not wear lotions, powders, or perfumes, or deodorant.  Do not shave 48 hours prior to surgery.  Men may shave face and neck.  Do not bring valuables to the hospital.  [facility] is not  responsible for any belongings or valuables.  Contacts, dentures or bridgework may not be worn into surgery.  Leave your suitcase in the car.  After surgery it may be brought to your room.  For patients admitted to the hospital, discharge time will be determined by your treatment team.  Patients discharged the day of surgery will not be allowed to drive home.   Special instructions:  [facility]- Preparing For Surgery  Before surgery, you can play an important role. Because skin is not sterile, your skin needs to be as free of germs as possible. You can reduce the number of germs on your skin by washing with CHG (chlorahexidine gluconate) Soap before surgery.  CHG is an antiseptic cleaner which kills germs and bonds with the skin to continue killing germs even after washing.    Oral Hygiene is also important to reduce your risk of infection.  Remember - BRUSH YOUR TEETH THE MORNING OF SURGERY WITH YOUR REGULAR TOOTHPASTE  Please do not use if you have an allergy to CHG or antibacterial soaps. If your skin becomes reddened/irritated stop using the CHG.  Do not shave (including legs and underarms) for at least 48 hours prior to first CHG shower. It is OK to shave your face.  Please follow these instructions carefully.   1. Shower the [employer]  BEFORE SURGERY and the MORNING OF SURGERY with CHG.   2. If you chose to wash your hair, wash your hair first as usual with your normal shampoo.  3. After you shampoo, rinse your hair and body thoroughly to remove the shampoo.  4. Use CHG as you would any other liquid soap. You can apply CHG directly to the skin and wash gently with a scrungie or a clean washcloth.   5. Apply the CHG Soap to your body ONLY FROM THE NECK DOWN.  Do not use on open wounds or open sores. Avoid contact with your eyes, ears, mouth and genitals (private parts). Wash Face and genitals (private parts)  with your normal soap.  6. Wash thoroughly, paying special attention to the  area where your surgery will be performed.  7. Thoroughly rinse your body with warm water from the neck down.  8. DO NOT shower/wash with your normal soap after using and rinsing off the CHG Soap.  9. Pat yourself dry with a CLEAN TOWEL.  10. Wear CLEAN PAJAMAS to bed the night before surgery, wear comfortable clothes the morning of surgery  11. Place CLEAN SHEETS on your bed the night of your first shower and DO NOT SLEEP WITH PETS.  Day of Surgery:  Do not apply any deodorants/lotions.  Please wear clean clothes to the hospital/surgery center.   Remember to brush your teeth WITH YOUR REGULAR TOOTHPASTE.  Please read over the following fact sheets that you were given. Pain Booklet, Coughing and Deep Breathing, MRSA Information and Surgical Site Infection Prevention

## 2019-02-09 ENCOUNTER — Other Ambulatory Visit: Payer: Self-pay

## 2019-02-09 ENCOUNTER — Encounter (HOSPITAL_COMMUNITY): Payer: Self-pay

## 2019-02-09 ENCOUNTER — Inpatient Hospital Stay (HOSPITAL_COMMUNITY)
Admission: RE | Admit: 2019-02-09 | Discharge: 2019-02-09 | Disposition: A | Discharge status: Home / Self Care | Payer: Medicaid Other | Source: Ambulatory Visit

## 2019-02-09 ENCOUNTER — Other Ambulatory Visit (HOSPITAL_COMMUNITY)
Admission: RE | Admit: 2019-02-09 | Discharge: 2019-02-09 | Disposition: A | Discharge status: Home / Self Care | Payer: Medicaid Other | Source: Ambulatory Visit | Attending: Orthopaedic Surgery | Admitting: Orthopaedic Surgery

## 2019-02-09 DIAGNOSIS — Z1159 Encounter for screening for other viral diseases: Secondary | ICD-10-CM | POA: Diagnosis not present

## 2019-02-09 LAB — SARS CORONAVIRUS 2 BY RT PCR (HOSPITAL ORDER, PERFORMED IN ~~LOC~~ HOSPITAL LAB): SARS Coronavirus 2: NEGATIVE

## 2019-02-09 NOTE — Progress Notes (Signed)
Pt. Instructed on pre-op instructions. May have clear liquids until  0930 day of surgery.NPO at midnight. Pt. Stated she go go for the covid screening at [facility] today.   Coronavirus Screening  Have you experienced the following symptoms:  Cough yes-chronic due to CODP Fever (>100.36F)  no Runny nose no Sore throat no Difficulty breathing/shortness of breath  H/o of shortness of breath due to COPD  Have you or a family member traveled in the last 14 days and where? no   If the patient indicates "YES" to the above questions, their PAT will be rescheduled to limit the exposure to others and, the surgeon will be notified. THE PATIENT WILL NEED TO BE ASYMPTOMATIC FOR 14 DAYS.   If the patient is not experiencing any of these symptoms, the PAT nurse will instruct them to NOT bring anyone with them to their appointment since they may have these symptoms or traveled as well.   Please remind your patients and families that hospital visitation restrictions are in effect and the importance of the restrictions.

## 2019-02-10 ENCOUNTER — Inpatient Hospital Stay (HOSPITAL_COMMUNITY)
Admission: RE | Admit: 2019-02-10 | Discharge: 2019-02-15 | DRG: 470 | Disposition: A | Discharge status: Home Health | Payer: Medicaid Other | Attending: Orthopaedic Surgery | Admitting: Orthopaedic Surgery

## 2019-02-10 ENCOUNTER — Observation Stay (HOSPITAL_COMMUNITY): Payer: Medicaid Other

## 2019-02-10 ENCOUNTER — Encounter (HOSPITAL_COMMUNITY): Payer: Self-pay | Admitting: *Deleted

## 2019-02-10 ENCOUNTER — Other Ambulatory Visit: Payer: Self-pay

## 2019-02-10 ENCOUNTER — Ambulatory Visit (HOSPITAL_COMMUNITY): Payer: Medicaid Other | Admitting: Certified Registered"

## 2019-02-10 ENCOUNTER — Encounter (HOSPITAL_COMMUNITY)
Admission: RE | Disposition: A | Discharge status: Home Health | Payer: Self-pay | Source: Home / Self Care | Attending: Orthopaedic Surgery

## 2019-02-10 ENCOUNTER — Ambulatory Visit (HOSPITAL_COMMUNITY): Payer: Medicaid Other | Admitting: Physician Assistant

## 2019-02-10 DIAGNOSIS — G4730 Sleep apnea, unspecified: Secondary | ICD-10-CM | POA: Diagnosis present

## 2019-02-10 DIAGNOSIS — M25762 Osteophyte, left knee: Secondary | ICD-10-CM | POA: Diagnosis present

## 2019-02-10 DIAGNOSIS — Z6839 Body mass index (BMI) 39.0-39.9, adult: Secondary | ICD-10-CM

## 2019-02-10 DIAGNOSIS — F319 Bipolar disorder, unspecified: Secondary | ICD-10-CM | POA: Diagnosis present

## 2019-02-10 DIAGNOSIS — Z79899 Other long term (current) drug therapy: Secondary | ICD-10-CM

## 2019-02-10 DIAGNOSIS — Z09 Encounter for follow-up examination after completed treatment for conditions other than malignant neoplasm: Secondary | ICD-10-CM

## 2019-02-10 DIAGNOSIS — F418 Other specified anxiety disorders: Secondary | ICD-10-CM | POA: Diagnosis present

## 2019-02-10 DIAGNOSIS — Z818 Family history of other mental and behavioral disorders: Secondary | ICD-10-CM

## 2019-02-10 DIAGNOSIS — Z96642 Presence of left artificial hip joint: Secondary | ICD-10-CM | POA: Diagnosis present

## 2019-02-10 DIAGNOSIS — E669 Obesity, unspecified: Secondary | ICD-10-CM | POA: Diagnosis present

## 2019-02-10 DIAGNOSIS — I10 Essential (primary) hypertension: Secondary | ICD-10-CM | POA: Diagnosis present

## 2019-02-10 DIAGNOSIS — M5416 Radiculopathy, lumbar region: Secondary | ICD-10-CM | POA: Diagnosis present

## 2019-02-10 DIAGNOSIS — J449 Chronic obstructive pulmonary disease, unspecified: Secondary | ICD-10-CM | POA: Diagnosis present

## 2019-02-10 DIAGNOSIS — G8929 Other chronic pain: Secondary | ICD-10-CM | POA: Diagnosis present

## 2019-02-10 DIAGNOSIS — Z8249 Family history of ischemic heart disease and other diseases of the circulatory system: Secondary | ICD-10-CM

## 2019-02-10 DIAGNOSIS — Z79891 Long term (current) use of opiate analgesic: Secondary | ICD-10-CM

## 2019-02-10 DIAGNOSIS — M1712 Unilateral primary osteoarthritis, left knee: Principal | ICD-10-CM | POA: Diagnosis present

## 2019-02-10 DIAGNOSIS — Z96652 Presence of left artificial knee joint: Secondary | ICD-10-CM

## 2019-02-10 DIAGNOSIS — M2342 Loose body in knee, left knee: Secondary | ICD-10-CM | POA: Diagnosis present

## 2019-02-10 DIAGNOSIS — F17210 Nicotine dependence, cigarettes, uncomplicated: Secondary | ICD-10-CM | POA: Diagnosis present

## 2019-02-10 DIAGNOSIS — N179 Acute kidney failure, unspecified: Secondary | ICD-10-CM | POA: Diagnosis present

## 2019-02-10 DIAGNOSIS — K219 Gastro-esophageal reflux disease without esophagitis: Secondary | ICD-10-CM | POA: Diagnosis present

## 2019-02-10 HISTORY — PX: TOTAL KNEE ARTHROPLASTY: SHX125

## 2019-02-10 LAB — COMPREHENSIVE METABOLIC PANEL
ALT: 16 U/L (ref 0–44)
AST: 17 U/L (ref 15–41)
Albumin: 3.6 g/dL (ref 3.5–5.0)
Alkaline Phosphatase: 114 U/L (ref 38–126)
Anion gap: 12 (ref 5–15)
BUN: 11 mg/dL (ref 6–20)
CO2: 27 mmol/L (ref 22–32)
Calcium: 9.3 mg/dL (ref 8.9–10.3)
Chloride: 101 mmol/L (ref 98–111)
Creatinine, Ser: 0.76 mg/dL (ref 0.44–1.00)
GFR calc Af Amer: >60 mL/min (ref >60)
GFR calc non Af Amer: >60 mL/min (ref >60)
Glucose, Bld: 132 mg/dL — ABNORMAL HIGH (ref 70–99)
Potassium: 4 mmol/L (ref 3.5–5.1)
Sodium: 140 mmol/L (ref 135–145)
Total Bilirubin: 0.4 mg/dL (ref 0.3–1.2)
Total Protein: 6.5 g/dL (ref 6.5–8.1)

## 2019-02-10 LAB — URINALYSIS, ROUTINE W REFLEX MICROSCOPIC
Bilirubin Urine: NEGATIVE
Glucose, UA: NEGATIVE mg/dL
Hgb urine dipstick: NEGATIVE
Ketones, ur: NEGATIVE mg/dL
Leukocytes,Ua: NEGATIVE
Nitrite: NEGATIVE
Protein, ur: NEGATIVE mg/dL
Specific Gravity, Urine: 1.014 (ref 1.005–1.030)
pH: 6 (ref 5.0–8.0)

## 2019-02-10 LAB — CBC
HCT: 45.5 % (ref 36.0–46.0)
Hemoglobin: 14.7 g/dL (ref 12.0–15.0)
MCH: 28.9 pg (ref 26.0–34.0)
MCHC: 32.3 g/dL (ref 30.0–36.0)
MCV: 89.4 fL (ref 80.0–100.0)
Platelets: 334 10*3/uL (ref 150–400)
RBC: 5.09 MIL/uL (ref 3.87–5.11)
RDW: 14.8 % (ref 11.5–15.5)
WBC: 12.7 10*3/uL — ABNORMAL HIGH (ref 4.0–10.5)
nRBC: 0 % (ref 0.0–0.2)

## 2019-02-10 LAB — SURGICAL PCR SCREEN
MRSA, PCR: NEGATIVE
Staphylococcus aureus: NEGATIVE

## 2019-02-10 SURGERY — ARTHROPLASTY, KNEE, TOTAL
Anesthesia: Regional | Site: Knee | Laterality: Left

## 2019-02-10 MED ORDER — HYDROMORPHONE HCL 1 MG/ML IJ SOLN
0.5000 mg | INTRAMUSCULAR | Status: DC | PRN
  Administered 2019-02-10: 16:00:00 0.5 mg via INTRAVENOUS

## 2019-02-10 MED ORDER — FENTANYL CITRATE (PF) 100 MCG/2ML IJ SOLN
INTRAMUSCULAR | Status: AC
  Administered 2019-02-10: 13:00:00 100 ug via INTRAVENOUS
  Filled 2019-02-10: qty 2

## 2019-02-10 MED ORDER — OXYCODONE HCL 5 MG PO TABS
ORAL_TABLET | ORAL | Status: AC
  Filled 2019-02-10: qty 1

## 2019-02-10 MED ORDER — DEXAMETHASONE SODIUM PHOSPHATE 10 MG/ML IJ SOLN
INTRAMUSCULAR | Status: AC
  Filled 2019-02-10: qty 2

## 2019-02-10 MED ORDER — BUPIVACAINE HCL (PF) 0.25 % IJ SOLN
INTRAMUSCULAR | Status: DC | PRN
  Administered 2019-02-10: 30 mL

## 2019-02-10 MED ORDER — METOPROLOL TARTRATE 25 MG PO TABS
25.0000 mg | ORAL_TABLET | Freq: Two times a day (BID) | ORAL | Status: DC
  Administered 2019-02-11 – 2019-02-15 (×7): 25 mg via ORAL
  Filled 2019-02-10 (×9): qty 1

## 2019-02-10 MED ORDER — BUPROPION HCL ER (XL) 150 MG PO TB24
300.0000 mg | ORAL_TABLET | Freq: Every morning | ORAL | Status: DC
  Administered 2019-02-11 – 2019-02-15 (×5): 300 mg via ORAL
  Filled 2019-02-10 (×5): qty 2

## 2019-02-10 MED ORDER — PHENOL 1.4 % MT LIQD: 1.0000 | OROMUCOSAL | Status: DC | PRN

## 2019-02-10 MED ORDER — RISPERIDONE 1 MG PO TABS
1.0000 mg | ORAL_TABLET | Freq: Every day | ORAL | Status: DC
  Administered 2019-02-10 – 2019-02-14 (×4): 1 mg via ORAL
  Filled 2019-02-10 (×6): qty 1

## 2019-02-10 MED ORDER — METOCLOPRAMIDE HCL 5 MG PO TABS: 5.0000 mg | ORAL_TABLET | Freq: Three times a day (TID) | ORAL | Status: DC | PRN

## 2019-02-10 MED ORDER — CHLORHEXIDINE GLUCONATE 4 % EX LIQD: 60.0000 mL | Freq: Once | CUTANEOUS | Status: DC

## 2019-02-10 MED ORDER — PANTOPRAZOLE SODIUM 40 MG PO TBEC
40.0000 mg | DELAYED_RELEASE_TABLET | Freq: Every day | ORAL | Status: DC
  Administered 2019-02-10 – 2019-02-15 (×6): 40 mg via ORAL
  Filled 2019-02-10 (×6): qty 1

## 2019-02-10 MED ORDER — PROPOFOL 1000 MG/100ML IV EMUL
INTRAVENOUS | Status: AC
  Filled 2019-02-10: qty 100

## 2019-02-10 MED ORDER — DOXEPIN HCL 25 MG PO CAPS
75.0000 mg | ORAL_CAPSULE | Freq: Every day | ORAL | Status: DC
  Administered 2019-02-10 – 2019-02-14 (×4): 75 mg via ORAL
  Filled 2019-02-10 (×6): qty 3

## 2019-02-10 MED ORDER — TRANEXAMIC ACID-NACL 1000-0.7 MG/100ML-% IV SOLN
INTRAVENOUS | Status: AC
  Filled 2019-02-10: qty 100

## 2019-02-10 MED ORDER — DOXEPIN HCL 75 MG PO CAPS
75.0000 mg | ORAL_CAPSULE | Freq: Every day | ORAL | Status: DC
  Filled 2019-02-10: qty 1

## 2019-02-10 MED ORDER — EPINEPHRINE 1 MG/10ML IJ SOSY
PREFILLED_SYRINGE | INTRAMUSCULAR | Status: AC
  Filled 2019-02-10: qty 10

## 2019-02-10 MED ORDER — ROCURONIUM BROMIDE 10 MG/ML (PF) SYRINGE
PREFILLED_SYRINGE | INTRAVENOUS | Status: AC
  Filled 2019-02-10: qty 20

## 2019-02-10 MED ORDER — BUPIVACAINE HCL (PF) 0.25 % IJ SOLN
INTRAMUSCULAR | Status: AC
  Filled 2019-02-10: qty 30

## 2019-02-10 MED ORDER — SODIUM CHLORIDE (PF) 0.9 % IJ SOLN
INTRAMUSCULAR | Status: AC
  Filled 2019-02-10: qty 10

## 2019-02-10 MED ORDER — BUSPIRONE HCL 5 MG PO TABS
15.0000 mg | ORAL_TABLET | Freq: Three times a day (TID) | ORAL | Status: DC
  Administered 2019-02-10 – 2019-02-15 (×13): 15 mg via ORAL
  Filled 2019-02-10 (×13): qty 1

## 2019-02-10 MED ORDER — POLYETHYLENE GLYCOL 3350 17 G PO PACK: 17.0000 g | PACK | Freq: Every day | ORAL | Status: DC | PRN

## 2019-02-10 MED ORDER — CLONAZEPAM 1 MG PO TABS
1.0000 mg | ORAL_TABLET | Freq: Three times a day (TID) | ORAL | Status: DC
  Administered 2019-02-10 – 2019-02-15 (×12): 1 mg via ORAL
  Filled 2019-02-10 (×12): qty 1

## 2019-02-10 MED ORDER — ONDANSETRON HCL 4 MG PO TABS: 4.0000 mg | ORAL_TABLET | Freq: Four times a day (QID) | ORAL | Status: DC | PRN

## 2019-02-10 MED ORDER — MIDAZOLAM HCL 2 MG/2ML IJ SOLN
INTRAMUSCULAR | Status: AC
  Filled 2019-02-10: qty 2

## 2019-02-10 MED ORDER — ALBUTEROL SULFATE (2.5 MG/3ML) 0.083% IN NEBU
2.5000 mg | INHALATION_SOLUTION | Freq: Two times a day (BID) | RESPIRATORY_TRACT | Status: DC
  Administered 2019-02-11 – 2019-02-15 (×6): 2.5 mg via RESPIRATORY_TRACT
  Filled 2019-02-10 (×7): qty 3

## 2019-02-10 MED ORDER — DOCUSATE SODIUM 100 MG PO CAPS
100.0000 mg | ORAL_CAPSULE | Freq: Two times a day (BID) | ORAL | Status: DC
  Administered 2019-02-10 – 2019-02-15 (×9): 100 mg via ORAL
  Filled 2019-02-10 (×9): qty 1

## 2019-02-10 MED ORDER — METHOCARBAMOL 1000 MG/10ML IJ SOLN
500.0000 mg | Freq: Four times a day (QID) | INTRAVENOUS | Status: DC | PRN
  Filled 2019-02-10: qty 5

## 2019-02-10 MED ORDER — TRANEXAMIC ACID 1000 MG/10ML IV SOLN
2000.0000 mg | Freq: Once | INTRAVENOUS | Status: DC
  Filled 2019-02-10: qty 20

## 2019-02-10 MED ORDER — MIDAZOLAM HCL 2 MG/2ML IJ SOLN
INTRAMUSCULAR | Status: AC
  Administered 2019-02-10: 2 mg via INTRAVENOUS
  Filled 2019-02-10: qty 2

## 2019-02-10 MED ORDER — METHOCARBAMOL 500 MG PO TABS
500.0000 mg | ORAL_TABLET | Freq: Four times a day (QID) | ORAL | Status: DC | PRN
  Administered 2019-02-10 – 2019-02-14 (×5): 500 mg via ORAL
  Filled 2019-02-10 (×4): qty 1

## 2019-02-10 MED ORDER — AMLODIPINE BESYLATE 10 MG PO TABS
10.0000 mg | ORAL_TABLET | Freq: Every day | ORAL | Status: DC
  Administered 2019-02-12 – 2019-02-15 (×4): 10 mg via ORAL
  Filled 2019-02-10 (×5): qty 1

## 2019-02-10 MED ORDER — HYDROMORPHONE HCL 1 MG/ML IJ SOLN
INTRAMUSCULAR | Status: AC
  Filled 2019-02-10: qty 1

## 2019-02-10 MED ORDER — ASPIRIN EC 325 MG PO TBEC
325.0000 mg | DELAYED_RELEASE_TABLET | Freq: Every day | ORAL | Status: DC
  Administered 2019-02-11 – 2019-02-15 (×5): 325 mg via ORAL
  Filled 2019-02-10 (×5): qty 1

## 2019-02-10 MED ORDER — ACETAMINOPHEN 325 MG PO TABS
325.0000 mg | ORAL_TABLET | Freq: Four times a day (QID) | ORAL | Status: DC | PRN
  Administered 2019-02-11: 650 mg via ORAL
  Filled 2019-02-10: qty 2

## 2019-02-10 MED ORDER — DULOXETINE HCL 60 MG PO CPEP
60.0000 mg | ORAL_CAPSULE | Freq: Two times a day (BID) | ORAL | Status: DC
  Administered 2019-02-10 – 2019-02-15 (×9): 60 mg via ORAL
  Filled 2019-02-10 (×9): qty 1

## 2019-02-10 MED ORDER — FENTANYL CITRATE (PF) 250 MCG/5ML IJ SOLN
INTRAMUSCULAR | Status: AC
  Filled 2019-02-10: qty 5

## 2019-02-10 MED ORDER — ONDANSETRON HCL 4 MG/2ML IJ SOLN: 4.0000 mg | Freq: Four times a day (QID) | INTRAMUSCULAR | Status: DC | PRN

## 2019-02-10 MED ORDER — SUCCINYLCHOLINE CHLORIDE 200 MG/10ML IV SOSY
PREFILLED_SYRINGE | INTRAVENOUS | Status: AC
  Filled 2019-02-10: qty 20

## 2019-02-10 MED ORDER — PROPOFOL 500 MG/50ML IV EMUL
INTRAVENOUS | Status: DC | PRN
  Administered 2019-02-10: 100 ug/kg/min via INTRAVENOUS

## 2019-02-10 MED ORDER — MIDAZOLAM HCL 2 MG/2ML IJ SOLN
2.0000 mg | Freq: Once | INTRAMUSCULAR | Status: AC
  Administered 2019-02-10: 2 mg via INTRAVENOUS

## 2019-02-10 MED ORDER — CEFAZOLIN SODIUM-DEXTROSE 2-4 GM/100ML-% IV SOLN
2.0000 g | INTRAVENOUS | Status: AC
  Administered 2019-02-10: 13:00:00 2 g via INTRAVENOUS

## 2019-02-10 MED ORDER — ONDANSETRON HCL 4 MG/2ML IJ SOLN
INTRAMUSCULAR | Status: AC
  Filled 2019-02-10: qty 4

## 2019-02-10 MED ORDER — UMECLIDINIUM BROMIDE 62.5 MCG/INH IN AEPB
1.0000 | INHALATION_SPRAY | Freq: Every day | RESPIRATORY_TRACT | Status: DC
  Administered 2019-02-11 – 2019-02-15 (×4): 1 via RESPIRATORY_TRACT
  Filled 2019-02-10: qty 7

## 2019-02-10 MED ORDER — FLUTICASONE-UMECLIDIN-VILANT 100-62.5-25 MCG/INH IN AEPB: 1.0000 | INHALATION_SPRAY | Freq: Every day | RESPIRATORY_TRACT | Status: DC

## 2019-02-10 MED ORDER — ONDANSETRON HCL 4 MG/2ML IJ SOLN
INTRAMUSCULAR | Status: DC | PRN
  Administered 2019-02-10: 4 mg via INTRAVENOUS

## 2019-02-10 MED ORDER — METHOCARBAMOL 500 MG PO TABS
ORAL_TABLET | ORAL | Status: AC
  Filled 2019-02-10: qty 1

## 2019-02-10 MED ORDER — BUPIVACAINE LIPOSOME 1.3 % IJ SUSP
INTRAMUSCULAR | Status: DC | PRN
  Administered 2019-02-10: 20 mL

## 2019-02-10 MED ORDER — BUPIVACAINE LIPOSOME 1.3 % IJ SUSP
20.0000 mL | Freq: Once | INTRAMUSCULAR | Status: DC
  Filled 2019-02-10: qty 20

## 2019-02-10 MED ORDER — ONDANSETRON HCL 4 MG/2ML IJ SOLN
INTRAMUSCULAR | Status: AC
  Filled 2019-02-10: qty 2

## 2019-02-10 MED ORDER — SODIUM CHLORIDE 0.9 % IV SOLN
INTRAVENOUS | Status: DC
  Administered 2019-02-10: 17:00:00 via INTRAVENOUS

## 2019-02-10 MED ORDER — PROPOFOL 10 MG/ML IV BOLUS
INTRAVENOUS | Status: DC | PRN
  Administered 2019-02-10: 30 mg via INTRAVENOUS

## 2019-02-10 MED ORDER — OXYBUTYNIN CHLORIDE ER 5 MG PO TB24
5.0000 mg | ORAL_TABLET | Freq: Every day | ORAL | Status: DC
  Administered 2019-02-11 – 2019-02-15 (×5): 5 mg via ORAL
  Filled 2019-02-10 (×5): qty 1

## 2019-02-10 MED ORDER — BUPRENORPHINE HCL 300 MCG BU FILM: 300.0000 mg | ORAL_FILM | Freq: Two times a day (BID) | BUCCAL | Status: DC

## 2019-02-10 MED ORDER — LIDOCAINE 2% (20 MG/ML) 5 ML SYRINGE
INTRAMUSCULAR | Status: AC
  Filled 2019-02-10: qty 5

## 2019-02-10 MED ORDER — FLUTICASONE FUROATE-VILANTEROL 100-25 MCG/INH IN AEPB
1.0000 | INHALATION_SPRAY | Freq: Every day | RESPIRATORY_TRACT | Status: DC
  Administered 2019-02-11 – 2019-02-15 (×4): 1 via RESPIRATORY_TRACT
  Filled 2019-02-10: qty 28

## 2019-02-10 MED ORDER — CEFAZOLIN SODIUM-DEXTROSE 1-4 GM/50ML-% IV SOLN
1.0000 g | Freq: Three times a day (TID) | INTRAVENOUS | Status: AC
  Administered 2019-02-10 (×2): 1 g via INTRAVENOUS
  Filled 2019-02-10 (×2): qty 50

## 2019-02-10 MED ORDER — SODIUM CHLORIDE 0.9 % IR SOLN
Status: DC | PRN
  Administered 2019-02-10: 1000 mL

## 2019-02-10 MED ORDER — OLOPATADINE HCL 0.1 % OP SOLN
1.0000 [drp] | Freq: Every day | OPHTHALMIC | Status: DC
  Administered 2019-02-10 – 2019-02-15 (×6): 1 [drp] via OPHTHALMIC
  Filled 2019-02-10: qty 5

## 2019-02-10 MED ORDER — METOCLOPRAMIDE HCL 5 MG/ML IJ SOLN: 5.0000 mg | Freq: Three times a day (TID) | INTRAMUSCULAR | Status: DC | PRN

## 2019-02-10 MED ORDER — MENTHOL 3 MG MT LOZG: 1.0000 | LOZENGE | OROMUCOSAL | Status: DC | PRN

## 2019-02-10 MED ORDER — CEFAZOLIN SODIUM-DEXTROSE 2-4 GM/100ML-% IV SOLN
INTRAVENOUS | Status: AC
  Filled 2019-02-10: qty 100

## 2019-02-10 MED ORDER — FENTANYL CITRATE (PF) 100 MCG/2ML IJ SOLN
100.0000 ug | Freq: Once | INTRAMUSCULAR | Status: AC
  Administered 2019-02-10: 100 ug via INTRAVENOUS

## 2019-02-10 MED ORDER — CEFAZOLIN SODIUM 1 G IJ SOLR
INTRAMUSCULAR | Status: AC
  Filled 2019-02-10: qty 30

## 2019-02-10 MED ORDER — TRANEXAMIC ACID-NACL 1000-0.7 MG/100ML-% IV SOLN
1000.0000 mg | INTRAVENOUS | Status: AC
  Administered 2019-02-10: 1000 mg via INTRAVENOUS
  Filled 2019-02-10: qty 100

## 2019-02-10 MED ORDER — OXYCODONE HCL 5 MG PO TABS
5.0000 mg | ORAL_TABLET | ORAL | Status: DC | PRN
  Administered 2019-02-10: 16:00:00 5 mg via ORAL
  Administered 2019-02-10: 10 mg via ORAL
  Administered 2019-02-11: 09:00:00 5 mg via ORAL
  Administered 2019-02-11 – 2019-02-14 (×10): 10 mg via ORAL
  Administered 2019-02-15 (×2): 5 mg via ORAL
  Filled 2019-02-10 (×4): qty 2
  Filled 2019-02-10: qty 1
  Filled 2019-02-10: qty 2
  Filled 2019-02-10: qty 1
  Filled 2019-02-10 (×3): qty 2
  Filled 2019-02-10 (×2): qty 1
  Filled 2019-02-10 (×3): qty 2

## 2019-02-10 MED ORDER — LACTATED RINGERS IV SOLN
INTRAVENOUS | Status: DC
  Administered 2019-02-10 (×2): via INTRAVENOUS

## 2019-02-10 SURGICAL SUPPLY — 72 items
APL SKNCLS STERI-STRIP NONHPOA (GAUZE/BANDAGES/DRESSINGS)
ATTUNE PS FEM LT SZ 3 CEM KNEE (Femur) ×2 IMPLANT
ATTUNE PSRP INSR SZ3 5 KNEE (Insert) ×1 IMPLANT
BANDAGE ACE 4X5 VEL STRL LF (GAUZE/BANDAGES/DRESSINGS) ×2 IMPLANT
BANDAGE ESMARK 6X9 LF (GAUZE/BANDAGES/DRESSINGS) ×1 IMPLANT
BASEPLATE TIBIAL ROTATING SZ 4 (Knees) ×1 IMPLANT
BENZOIN TINCTURE PRP APPL 2/3 (GAUZE/BANDAGES/DRESSINGS) IMPLANT
BLADE SAGITTAL 25.0X1.19X90 (BLADE) ×2 IMPLANT
BLADE SAW SGTL 13X75X1.27 (BLADE) ×2 IMPLANT
BNDG CMPR 9X6 STRL LF SNTH (GAUZE/BANDAGES/DRESSINGS) ×1
BNDG CMPR MED 10X6 ELC LF (GAUZE/BANDAGES/DRESSINGS) ×1
BNDG ELASTIC 6X10 VLCR STRL LF (GAUZE/BANDAGES/DRESSINGS) ×2 IMPLANT
BNDG ESMARK 6X9 LF (GAUZE/BANDAGES/DRESSINGS) ×2
BOWL SMART MIX CTS (DISPOSABLE) ×2 IMPLANT
BSPLAT TIB 4 CMNT ROT PLAT STR (Knees) ×1 IMPLANT
CEMENT HV SMART SET (Cement) ×4 IMPLANT
COVER SURGICAL LIGHT HANDLE (MISCELLANEOUS) ×2 IMPLANT
COVER WAND RF STERILE (DRAPES) ×2 IMPLANT
CUFF TOURNIQUET SINGLE 34IN LL (TOURNIQUET CUFF) ×2 IMPLANT
CUFF TOURNIQUET SINGLE 44IN (TOURNIQUET CUFF) IMPLANT
DRAPE ORTHO SPLIT 77X108 STRL (DRAPES) ×4
DRAPE SURG ORHT 6 SPLT 77X108 (DRAPES) ×2 IMPLANT
DRAPE U-SHAPE 47X51 STRL (DRAPES) ×2 IMPLANT
DRSG PAD ABDOMINAL 8X10 ST (GAUZE/BANDAGES/DRESSINGS) ×4 IMPLANT
DURAPREP 26ML APPLICATOR (WOUND CARE) ×2 IMPLANT
ELECT REM PT RETURN 9FT ADLT (ELECTROSURGICAL) ×2
ELECTRODE REM PT RTRN 9FT ADLT (ELECTROSURGICAL) ×1 IMPLANT
EVACUATOR 1/8 PVC DRAIN (DRAIN) IMPLANT
FACESHIELD WRAPAROUND (MASK) ×4 IMPLANT
GAUZE SPONGE 4X4 12PLY STRL (GAUZE/BANDAGES/DRESSINGS) ×2 IMPLANT
GAUZE XEROFORM 5X9 LF (GAUZE/BANDAGES/DRESSINGS) ×2 IMPLANT
GLOVE BIOGEL PI IND STRL 8 (GLOVE) ×2 IMPLANT
GLOVE BIOGEL PI INDICATOR 8 (GLOVE) ×2
GLOVE ORTHO TXT STRL SZ7.5 (GLOVE) ×4 IMPLANT
GOWN STRL REUS W/ TWL LRG LVL3 (GOWN DISPOSABLE) ×1 IMPLANT
GOWN STRL REUS W/ TWL XL LVL3 (GOWN DISPOSABLE) ×1 IMPLANT
GOWN STRL REUS W/TWL 2XL LVL3 (GOWN DISPOSABLE) ×2 IMPLANT
GOWN STRL REUS W/TWL LRG LVL3 (GOWN DISPOSABLE) ×2
GOWN STRL REUS W/TWL XL LVL3 (GOWN DISPOSABLE) ×2
HANDPIECE INTERPULSE COAX TIP (DISPOSABLE) ×2
IMMOBILIZER KNEE 22 UNIV (SOFTGOODS) ×1 IMPLANT
KIT BASIN OR (CUSTOM PROCEDURE TRAY) ×2 IMPLANT
KIT TURNOVER KIT B (KITS) ×2 IMPLANT
MANIFOLD NEPTUNE II (INSTRUMENTS) ×2 IMPLANT
MARKER SKIN DUAL TIP RULER LAB (MISCELLANEOUS) ×2 IMPLANT
NEEDLE 18GX1X1/2 (RX/OR ONLY) (NEEDLE) ×2 IMPLANT
NEEDLE HYPO 25GX1X1/2 BEV (NEEDLE) ×2 IMPLANT
NS IRRIG 1000ML POUR BTL (IV SOLUTION) ×2 IMPLANT
PACK TOTAL JOINT (CUSTOM PROCEDURE TRAY) ×2 IMPLANT
PAD ARMBOARD 7.5X6 YLW CONV (MISCELLANEOUS) ×4 IMPLANT
PAD CAST 4YDX4 CTTN HI CHSV (CAST SUPPLIES) ×1 IMPLANT
PADDING CAST COTTON 4X4 STRL (CAST SUPPLIES) ×2
PADDING CAST COTTON 6X4 STRL (CAST SUPPLIES) ×2 IMPLANT
PATELLA MEDIAL ATTUN 35MM KNEE (Knees) ×1 IMPLANT
PIN STEINMAN FIXATION KNEE (PIN) ×1 IMPLANT
SET HNDPC FAN SPRY TIP SCT (DISPOSABLE) ×1 IMPLANT
STAPLER VISISTAT 35W (STAPLE) ×1 IMPLANT
STRIP CLOSURE SKIN 1/2X4 (GAUZE/BANDAGES/DRESSINGS) IMPLANT
SUCTION FRAZIER HANDLE 10FR (MISCELLANEOUS) ×1
SUCTION TUBE FRAZIER 10FR DISP (MISCELLANEOUS) ×1 IMPLANT
SUT VIC AB 0 CT1 27 (SUTURE) ×2
SUT VIC AB 0 CT1 27XBRD ANBCTR (SUTURE) ×1 IMPLANT
SUT VIC AB 1 CTX 36 (SUTURE) ×4
SUT VIC AB 1 CTX36XBRD ANBCTR (SUTURE) ×2 IMPLANT
SUT VIC AB 2-0 CT1 27 (SUTURE) ×4
SUT VIC AB 2-0 CT1 TAPERPNT 27 (SUTURE) ×2 IMPLANT
SUT VIC AB 3-0 X1 27 (SUTURE) ×2 IMPLANT
SYR 50ML LL SCALE MARK (SYRINGE) ×2 IMPLANT
SYR CONTROL 10ML LL (SYRINGE) ×2 IMPLANT
TOWEL OR 17X24 6PK STRL BLUE (TOWEL DISPOSABLE) ×2 IMPLANT
TOWEL OR 17X26 10 PK STRL BLUE (TOWEL DISPOSABLE) ×2 IMPLANT
TRAY CATH 16FR W/PLASTIC CATH (SET/KITS/TRAYS/PACK) IMPLANT

## 2019-02-10 NOTE — Op Note (Signed)
Preop diagnosis: Left knee primary osteoarthritis with medial femoral condyle osteochondral defect.  Postop diagnosis: Same  Procedure: Left cemented total knee arthroplasty  Surgeon: Rodell Perna, MD  Assistant: Benjiman Core, PA-C medically necessary and present for the entire procedure  Anesthesia: Preoperative block plus spinal anesthesia plus Exparel and Marcaine.  Tourniquet time 47 minutes x350  Implants:Depuy #3 femur attune, #4 tibial rotating platform 5 mm rotating poly-tibial tray.  35 mm patella.  Procedure: After induction of spinal anesthesia preoperative block timeout procedure Exparel, Ancef prophylaxis proximal thigh tourniquet lateral post heel bump have been applied leg was prepped from the tourniquet to the tip the toes with DuraPrep usual extremity sheets drapes sterile skin marker Betadine Steri-Drape was applied.  Leg was wrapped in Esmarch tourniquet inflated medial parapatellar incision was made.  There was tricompartmental degenerative changes with osteochondral defect on the femur bone-on-bone changes marginal osteophytes.  Bursa removed synovectomy was performed with chronic synovitis noted.  Patella was resected 10 mm.  Sized and drilled for 35 mm 3 peg poly-.  Sizing on the femur after intramedullary hole was drilled was a size 3.  Distal cut chamfer cuts were made and 9 mm resected off the tibia.  5 mm spacer block gave nice fit full extension.  Spurs removed a large posterior osteophytes removed off the medial femoral condyle and 2 loose bodies that were greater than a centimeter removed from the popliteal region.  Menisci were resected medial meniscus had degenerative tearing.  Keel preparation on the tibia was performed.  Trial sizers were inserted full extension.  Pulsatile lavage back to mixing of the cement.  Tibia was cemented first followed by femur placement of the 5 mm rotating permanent poly-and then cementing the patella held with a self-retaining clamp all  excessive cement was removed.  Cement was hardened 15 minutes tourniquet deflated hemostasis obtained there is some popliteal oozing no pulsatile bright red blood.  The Mikael retractor had been used to protect posterior structures throughout cutting of the tibia in preparation.  Standard interrupted closure was used for the deep capsule 2-0 Vicryl subtenons tissue skin staple closure postop dressing and knee immobilizer.  Patient tolerated procedure well transferred to care room in stable condition.

## 2019-02-10 NOTE — Progress Notes (Signed)
Per pt's request, pt's mother, Naela Nodal, updated via mobile number [phone]. Will continue to monitor.

## 2019-02-10 NOTE — Anesthesia Procedure Notes (Signed)
Spinal  Start time: 02/10/2019 12:48 PM End time: 02/10/2019 12:52 PM Staffing Anesthesiologist: Lillia Abed, MD Performed: anesthesiologist  Preanesthetic Checklist Completed: patient identified, surgical consent, pre-op evaluation, timeout performed, IV checked, risks and benefits discussed and monitors and equipment checked Spinal Block Patient position: sitting Prep: ChloraPrep Patient monitoring: heart rate, cardiac monitor, continuous pulse ox and blood pressure Approach: right paramedian Location: L3-4 Injection technique: single-shot Needle Needle type: Pencan  Needle gauge: 24 G Needle length: 9 cm Needle insertion depth: 7 cm

## 2019-02-10 NOTE — Interval H&P Note (Signed)
History and Physical Interval Note:  02/10/2019 12:37 PM  Tiffany Lynch  has presented today for surgery, with the diagnosis of Left Osteomyelitis Knee.  The various methods of treatment have been discussed with the patient and family. After consideration of risks, benefits and other options for treatment, the patient has consented to  Procedure(s): LEFT TOTAL KNEE ARTHROPLASTY-CEMENTED (Left) as a surgical intervention.  The patient's history has been reviewed, patient examined, no change in status, stable for surgery.  I have reviewed the patient's chart and labs.  Questions were answered to the patient's satisfaction.     Marybelle Killings

## 2019-02-10 NOTE — Transfer of Care (Signed)
Immediate Anesthesia Transfer of Care Note  Patient: Tiffany Lynch  Procedure(s) Performed: LEFT TOTAL KNEE ARTHROPLASTY-CEMENTED (Left Knee)  Patient Location: PACU  Anesthesia Type:MAC combined with regional for post-op pain  Level of Consciousness: awake, alert  and oriented  Airway & Oxygen Therapy: Patient Spontanous Breathing and Patient connected to face mask oxygen  Post-op Assessment: Report given to RN and Post -op Vital signs reviewed and stable  Post vital signs: Reviewed and stable  Last Vitals:  Vitals Value Taken Time  BP 100/80 02/10/2019  2:50 PM  Temp    Pulse 72 02/10/2019  2:53 PM  Resp 18 02/10/2019  2:53 PM  SpO2 92 % 02/10/2019  2:53 PM  Vitals shown include unvalidated device data.  Last Pain:  Vitals:   02/10/19 1237  TempSrc:   PainSc: 0-No pain      Patients Stated Pain Goal: 3 (12/75/17 0017)  Complications: No apparent anesthesia complications

## 2019-02-10 NOTE — Anesthesia Procedure Notes (Signed)
Procedure Name: MAC Date/Time: 02/10/2019 1:02 PM Performed by: Imagene Riches, CRNA Pre-anesthesia Checklist: Patient identified, Emergency Drugs available, Suction available and Patient being monitored Patient Re-evaluated:Patient Re-evaluated prior to induction Oxygen Delivery Method: Nasal cannula

## 2019-02-10 NOTE — Progress Notes (Signed)
Pt arrived to room 5N29 via bed after surgery. Received report from Richardson Landry, [place] in PACU. See assessment. Will continue to monitor.

## 2019-02-10 NOTE — H&P (Signed)
TOTAL KNEE ADMISSION H&P  Patient is being admitted for left total knee arthroplasty.  Subjective:  Chief Complaint:left knee pain.  HPI: Tiffany Lynch, 58 y.o. female, has a history of pain and functional disability in the left knee due to arthritis and has failed non-surgical conservative treatments for greater than 12 weeks to includeNSAID's and/or analgesics, corticosteriod injections and activity modification.  Onset of symptoms was gradual, starting 2 years ago with gradually worsening course since that time. The patient noted no past surgery on the left knee(s).  Patient currently rates pain in the left knee(s) at 7 out of 10 with activity. Patient has night pain, worsening of pain with activity and weight bearing, pain that interferes with activities of daily living, pain with passive range of motion, crepitus and joint swelling.  Patient has evidence of subchondral cysts and periarticular osteophytes by imaging studies. 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 There is no active infection.  Patient Active Problem List   Diagnosis Date Noted  . Rib fractures 07/01/2017  . Tobacco dependence 07/01/2017  . Derangement of posterior horn of medial meniscus of left knee   . Loose body of left knee   . Hypotension 08/14/2016  . Respiratory failure with hypoxia ([place]) 08/14/2016  . Hyperglycemia 08/14/2016  . Alcohol abuse 08/14/2016  . Cocaine abuse ([place]) 08/14/2016  . Chronic pain 08/14/2016  . Oral thrush 08/14/2016  . Hypokalemia 05/31/2016  . Pre-syncope 05/31/2016  . Current smoker 07/20/2015  . Urge and stress incontinence 07/14/2015  . Hyperlipidemia 01/02/2015  . Insomnia 01/02/2015  . OAB (overactive bladder) 01/02/2015  . Depression 01/02/2015  . GERD (gastroesophageal reflux disease) 01/02/2015  . ARF (acute renal failure) ([place]) 12/21/2013  . COPD (chronic obstructive pulmonary disease) ([place]) 05/22/2012  . Dysfunctional uterine bleeding  04/01/2012  . Orthostatic hypotension 03/31/2012  . Encephalopathy acute 03/30/2012  . Chronic back pain   . Bipolar 1 disorder ([place])   . Anxiety    Past Medical History:  Diagnosis Date  . Anxiety   . Arthritis   . Bipolar 1 disorder ([place])   . Chronic back pain   . Chronic knee pain   . COPD (chronic obstructive pulmonary disease) ([place])   . Depression   . Dyspnea   . GERD (gastroesophageal reflux disease)   . History of anemia   . Hypertension   . Lumbar radiculopathy   . On home oxygen therapy "6 years ago"   3LPM PRN: "had this 6 years ago," pt no longer has O2 at home (07/01/2017)  . Sleep apnea     Past Surgical History:  Procedure Laterality Date  . COLONOSCOPY WITH PROPOFOL N/A 07/18/2014   Procedure: COLONOSCOPY WITH PROPOFOL;  Surgeon: Rogene Houston, MD;  Location: [facility];  Service: Endoscopy;  Laterality: N/A;  in cecum at 0753, withdrawal time 20 min  . DILATION AND CURETTAGE OF UTERUS  age 49  . KNEE ARTHROSCOPY WITH MEDIAL MENISECTOMY Left 10/10/2016   Procedure: KNEE ARTHROSCOPY WITH MEDIAL MENISECTOMY REMOVAL LOSE BODY;  Surgeon: Carole Civil, MD;  Location: [facility];  Service: Orthopedics;  Laterality: Left;  . POLYPECTOMY  05/22/2012   Procedure: POLYPECTOMY;  Surgeon: Jonnie Kind, MD;  Location: [facility];  Service: Gynecology;  Laterality: N/A;  Endometrial Polypectomy  . POLYPECTOMY N/A 07/18/2014   Procedure: POLYPECTOMY;  Surgeon: Rogene Houston, MD;  Location: [facility];  Service: Endoscopy;  Laterality: N/A;  . TUBAL LIGATION  Current Facility-Administered Medications  Medication Dose Route Frequency Provider Last Rate Last Dose  . bupivacaine liposome (EXPAREL) 1.3 % injection 266 mg  20 mL Infiltration Once Marybelle Killings, MD      . ceFAZolin (ANCEF) 2-4 GM/100ML-% IVPB           . ceFAZolin (ANCEF) IVPB 2g/100 mL premix  2 g Intravenous On Call to OR Marybelle Killings, MD      . chlorhexidine (HIBICLENS) 4 % liquid 4 application  60 mL Topical Once  Benjiman Core M, PA-C      . fentaNYL (SUBLIMAZE) 100 MCG/2ML injection           . lactated ringers infusion   Intravenous Continuous Lillia Abed, MD 10 mL/hr at 02/10/19 1028    . midazolam (VERSED) 2 MG/2ML injection           . tranexamic acid (CYKLOKAPRON) 2,000 mg in sodium chloride 0.9 % 50 mL Topical Application  0,960 mg Topical Once Marybelle Killings, MD       No Known Allergies  Social History   Tobacco Use  . Smoking status: Current Every Day Smoker    Packs/day: 0.50    Years: 41.00    Pack years: 20.50    Types: Cigarettes  . Smokeless tobacco: Never Used  Substance Use Topics  . Alcohol use: Not Currently    Alcohol/week: 2.0 - 3.0 standard drinks    Types: 2 - 3 Glasses of wine per week    Comment: drinks wine several times a week    Family History  Problem Relation Age of Onset  . Hypertension Mother   . Hypertension Brother   . Anxiety disorder Brother   . Depression Brother      ROS  Objective:  Physical Exam  Vital signs in last 24 hours: Temp:  [98.7 F (37.1 C)] 98.7 F (37.1 C) (06/03 1010) Pulse Rate:  [75] 75 (06/03 1010) Resp:  [18] 18 (06/03 1010) BP: (138)/(67) 138/67 (06/03 1010) SpO2:  [94 %] 94 % (06/03 1010) Weight:  [101.2 kg] 101.2 kg (06/03 1010)  Labs:   Estimated body mass index is 39.5 kg/m as calculated from the following:   Height as of this encounter: 5\' 3"  (1.6 m).   Weight as of this encounter: 101.2 kg.   Imaging Review Plain radiographs demonstrate moderate degenerative joint disease of the left knee(s). The overall alignment ismild varus. The bone quality appears to be good for age and reported activity level.      Assessment/Plan:  End stage arthritis, left knee   The patient history, physical examination, clinical judgment of the provider and imaging studies are consistent with end stage degenerative joint disease of the left knee(s) and total knee arthroplasty is deemed medically necessary. The treatment  options including medical management, injection therapy arthroscopy and arthroplasty were discussed at length. The risks and benefits of total knee arthroplasty were presented and reviewed. The risks due to aseptic loosening, infection, stiffness, patella tracking problems, thromboembolic complications and other imponderables were discussed. The patient acknowledged the explanation, agreed to proceed with the plan and consent was signed. Patient is being admitted for inpatient treatment for surgery, pain control, PT, OT, prophylactic antibiotics, VTE prophylaxis, progressive ambulation and ADL's and discharge planning. The patient is planning to be discharged home with home health services    Anticipated LOS equal to or greater than 2 midnights due to - Age 28 and older with one or more of the  following:  - Obesity  - Expected need for hospital services (PT, OT, Nursing) required for safe  discharge  - Anticipated need for postoperative skilled nursing care or inpatient rehab  - Active co-morbidities: Chronic pain requiring opiods OR   - Unanticipated findings during/Post Surgery:   - Patient is a high risk of re-admission due to: None

## 2019-02-10 NOTE — Plan of Care (Signed)
Problem: Education: Goal: Knowledge of the prescribed therapeutic regimen will improve Outcome: Progressing   Problem: Activity: Goal: Range of joint motion will improve Outcome: Progressing   Problem: Clinical Measurements: Goal: Postoperative complications will be avoided or minimized Outcome: Progressing   Problem: Pain Management: Goal: Pain level will decrease with appropriate interventions Outcome: Progressing   Problem: Skin Integrity: Goal: Will show signs of wound healing Outcome: Progressing

## 2019-02-10 NOTE — Anesthesia Procedure Notes (Signed)
Anesthesia Regional Block: Adductor canal block   Pre-Anesthetic Checklist: ,, timeout performed, Correct Patient, Correct Site, Correct Laterality, Correct Procedure, Correct Position, site marked, Risks and benefits discussed,  Surgical consent,  Pre-op evaluation,  At surgeon's request and post-op pain management  Laterality: Left  Prep: chloraprep       Needles:  Injection technique: Single-shot  Needle Type: Echogenic Stimulator Needle     Needle Length: 9cm  Needle Gauge: 21     Additional Needles:   Narrative:  Start time: 02/10/2019 12:28 PM End time: 02/10/2019 12:38 PM Injection made incrementally with aspirations every 5 mL.  Performed by: Personally  Anesthesiologist: Lillia Abed, MD  Additional Notes: Monitors applied. Patient sedated. Sterile prep and drape,hand hygiene and sterile gloves were used. Relevant anatomy identified.Needle position confirmed.Local anesthetic injected incrementally after negative aspiration. Local anesthetic spread visualized around nerve(s). Vascular puncture avoided. No complications. Image printed for medical record.The patient tolerated the procedure well.    Lillia Abed MD

## 2019-02-10 NOTE — Progress Notes (Signed)
Orthopedic Tech Progress Note Patient Details:  Tiffany Lynch [birth year] [id number]  Ortho Devices Ortho Device/Splint Interventions: Application   Post Interventions Patient Tolerated: Well Instructions Provided: Care of device   [name] 02/10/2019, 3:35 PM

## 2019-02-10 NOTE — Progress Notes (Signed)
Orthopedic Tech Progress Note Patient Details:  Tiffany Lynch 1961-06-12 [id number]  Ortho Devices Type of Ortho Device: Knee Immobilizer Ortho Device/Splint Interventions: Application   Post Interventions Patient Tolerated: Well Instructions Provided: Care of device   [name] 02/10/2019, 4:49 PM

## 2019-02-10 NOTE — Evaluation (Signed)
Physical Therapy Evaluation Patient Details Name: Tiffany Lynch MRN: [id number] DOB: 05-23-1961 Today's Date: 02/10/2019   History of Present Illness  Pt is a 58 y/o female s/p L TKA. PMH includes tobacco use, drug abuse, COPD, bipolar disorder, and HTN.   Clinical Impression  Pt is s/p surgery above with deficits below. Pt with increased pain and lethargy and with limited mobility tolerance this session. Required mod A to stand with RW this session. Unable to tolerate LLE exercises this session, so will need review during next session. Will continue to follow acutely to maximize functional mobility independence and safety.     Follow Up Recommendations Follow surgeon's recommendation for DC plan and follow-up therapies;Supervision for mobility/OOB    Equipment Recommendations  3in1 (PT)    Recommendations for Other Services OT consult     Precautions / Restrictions Precautions Precautions: Knee Precaution Booklet Issued: No Precaution Comments: Reviewed knee precautions with pt.  Restrictions Weight Bearing Restrictions: Yes LLE Weight Bearing: Weight bearing as tolerated      Mobility  Bed Mobility Overal bed mobility: Needs Assistance Bed Mobility: Supine to Sit;Sit to Supine     Supine to sit: Min assist Sit to supine: Min assist   General bed mobility comments: Min A for trunk elevation and LLE assist to come to sitting. Required min A for LLE assist for return to supine.   Transfers Overall transfer level: Needs assistance Equipment used: Rolling walker (2 wheeled) Transfers: Sit to/from Stand Sit to Stand: Mod assist         General transfer comment: Mod A for lift assist and steadying assist. Pt with increased pain upon standing and requesting to sit back down. Returned to supine.   Ambulation/Gait                Stairs            Wheelchair Mobility    Modified Rankin (Stroke Patients Only)       Balance Overall balance assessment:  Needs assistance Sitting-balance support: No upper extremity supported;Feet supported Sitting balance-Leahy Scale: Fair     Standing balance support: Bilateral upper extremity supported;During functional activity Standing balance-Leahy Scale: Poor Standing balance comment: Reliant on BUE support                              Pertinent Vitals/Pain Pain Assessment: 0-10 Pain Score: 10-Worst pain ever Pain Location: L knee  Pain Descriptors / Indicators: Aching;Operative site guarding;Sharp Pain Intervention(s): Limited activity within patient's tolerance;Monitored during session;Repositioned    Home Living Family/patient expects to be discharged to:: Private residence Living Arrangements: Non-relatives/Friends Available Help at Discharge: Friend(s);Available PRN/intermittently Type of Home: House Home Access: Stairs to enter Entrance Stairs-Rails: Right;Left;Can reach both Entrance Stairs-Number of Steps: 2 Home Layout: One level Home Equipment: Walker - 2 wheels      Prior Function Level of Independence: Independent with assistive device(s)         Comments: Used RW for ambulation      Hand Dominance        Extremity/Trunk Assessment   Upper Extremity Assessment Upper Extremity Assessment: Defer to OT evaluation    Lower Extremity Assessment Lower Extremity Assessment: LLE deficits/detail LLE Deficits / Details: Deficits consistent with post op pain and weakness.     Cervical / Trunk Assessment Cervical / Trunk Assessment: Kyphotic  Communication   Communication: No difficulties  Cognition Arousal/Alertness: Lethargic;Suspect due to medications Behavior During Therapy:  Flat affect Overall Cognitive Status: No family/caregiver present to determine baseline cognitive functioning                                 General Comments: Pt very sleepy throughout, however, when PT mentioned that she was sleepy, pt stated "I'm not sleepy, I'm  in pain and need to close my eyes.       General Comments      Exercises     Assessment/Plan    PT Assessment Patient needs continued PT services  PT Problem List Decreased strength;Decreased activity tolerance;Decreased balance;Decreased range of motion;Decreased mobility;Decreased knowledge of use of DME;Decreased knowledge of precautions;Decreased safety awareness;Pain       PT Treatment Interventions DME instruction;Gait training;Stair training;Functional mobility training;Therapeutic activities;Therapeutic exercise;Balance training;Patient/family education    PT Goals (Current goals can be found in the Care Plan section)  Acute Rehab PT Goals Patient Stated Goal: for pain to get better PT Goal Formulation: With patient Time For Goal Achievement: 02/24/19 Potential to Achieve Goals: Good    Frequency 7X/week   Barriers to discharge Decreased caregiver support      Co-evaluation               AM-PAC PT "6 Clicks" Mobility  Outcome Measure Help needed turning from your back to your side while in a flat bed without using bedrails?: A Little Help needed moving from lying on your back to sitting on the side of a flat bed without using bedrails?: A Little Help needed moving to and from a bed to a chair (including a wheelchair)?: A Lot Help needed standing up from a chair using your arms (e.g., wheelchair or bedside chair)?: A Lot Help needed to walk in hospital room?: A Lot Help needed climbing 3-5 steps with a railing? : A Lot 6 Click Score: 14    End of Session   Activity Tolerance: Patient limited by pain Patient left: in bed;with call bell/phone within reach;with bed alarm set Nurse Communication: Mobility status;Patient requests pain meds PT Visit Diagnosis: Difficulty in walking, not elsewhere classified (R26.2);Muscle weakness (generalized) (M62.81);Pain Pain - Right/Left: Left Pain - part of body: Knee    Time: 2774-1287 PT Time Calculation (min)  (ACUTE ONLY): 17 min   Charges:   PT Evaluation $PT Eval Low Complexity: Goodman, PT, DPT  Acute Rehabilitation Services  Pager: [phone] Office: [phone]   Rudean Hitt 02/10/2019, 6:21 PM

## 2019-02-10 NOTE — Anesthesia Postprocedure Evaluation (Signed)
Anesthesia Post Note  Patient: Tiffany Lynch  Procedure(s) Performed: LEFT TOTAL KNEE ARTHROPLASTY-CEMENTED (Left Knee)     Patient location during evaluation: PACU Anesthesia Type: Regional Level of consciousness: oriented and awake and alert Pain management: pain level controlled Vital Signs Assessment: post-procedure vital signs reviewed and stable Respiratory status: spontaneous breathing, respiratory function stable and patient connected to nasal cannula oxygen Cardiovascular status: blood pressure returned to baseline and stable Postop Assessment: no headache, no backache and no apparent nausea or vomiting Anesthetic complications: no    Last Vitals:  Vitals:   02/10/19 1500 02/10/19 1505  BP: 100/80 111/74  Pulse: 72 74  Resp: 15 19  Temp: (!) 36.2 C   SpO2: 90% 95%    Last Pain:  Vitals:   02/10/19 1520  TempSrc:   PainSc: 0-No pain            L Sensory Level: S1-Sole of foot, small toes (02/10/19 1520) R Sensory Level: S1-Sole of foot, small toes (02/10/19 1520)  Naleigha Raimondi DAVID

## 2019-02-10 NOTE — Anesthesia Preprocedure Evaluation (Signed)
Anesthesia Evaluation  Patient identified by MRN, date of birth, ID band Patient awake    Reviewed: Allergy & Precautions, NPO status , Patient's Chart, lab work & pertinent test results  Airway Mallampati: I  TM Distance: >3 FB Neck ROM: Full    Dental   Pulmonary sleep apnea , COPD, Current Smoker,    Pulmonary exam normal        Cardiovascular hypertension, Pt. on medications Normal cardiovascular exam     Neuro/Psych Anxiety Depression Bipolar Disorder    GI/Hepatic GERD  Medicated and Controlled,  Endo/Other    Renal/GU Renal disease     Musculoskeletal   Abdominal   Peds  Hematology   Anesthesia Other Findings   Reproductive/Obstetrics                             Anesthesia Physical Anesthesia Plan  ASA: III  Anesthesia Plan: Regional   Post-op Pain Management:  Regional for Post-op pain   Induction: Intravenous  PONV Risk Score and Plan: 1 and Ondansetron  Airway Management Planned: Simple Face Mask  Additional Equipment:   Intra-op Plan:   Post-operative Plan:   Informed Consent: I have reviewed the patients History and Physical, chart, labs and discussed the procedure including the risks, benefits and alternatives for the proposed anesthesia with the patient or authorized representative who has indicated his/her understanding and acceptance.       Plan Discussed with: CRNA and Surgeon  Anesthesia Plan Comments:         Anesthesia Quick Evaluation

## 2019-02-11 ENCOUNTER — Encounter (HOSPITAL_COMMUNITY): Payer: Self-pay | Admitting: Orthopaedic Surgery

## 2019-02-11 DIAGNOSIS — Z8249 Family history of ischemic heart disease and other diseases of the circulatory system: Secondary | ICD-10-CM | POA: Diagnosis not present

## 2019-02-11 DIAGNOSIS — G4730 Sleep apnea, unspecified: Secondary | ICD-10-CM | POA: Diagnosis present

## 2019-02-11 DIAGNOSIS — F319 Bipolar disorder, unspecified: Secondary | ICD-10-CM | POA: Diagnosis present

## 2019-02-11 DIAGNOSIS — K219 Gastro-esophageal reflux disease without esophagitis: Secondary | ICD-10-CM | POA: Diagnosis present

## 2019-02-11 DIAGNOSIS — Z79891 Long term (current) use of opiate analgesic: Secondary | ICD-10-CM | POA: Diagnosis not present

## 2019-02-11 DIAGNOSIS — M5416 Radiculopathy, lumbar region: Secondary | ICD-10-CM | POA: Diagnosis present

## 2019-02-11 DIAGNOSIS — I10 Essential (primary) hypertension: Secondary | ICD-10-CM | POA: Diagnosis present

## 2019-02-11 DIAGNOSIS — M1712 Unilateral primary osteoarthritis, left knee: Secondary | ICD-10-CM | POA: Diagnosis present

## 2019-02-11 DIAGNOSIS — N179 Acute kidney failure, unspecified: Secondary | ICD-10-CM | POA: Diagnosis present

## 2019-02-11 DIAGNOSIS — M25762 Osteophyte, left knee: Secondary | ICD-10-CM | POA: Diagnosis present

## 2019-02-11 DIAGNOSIS — F17210 Nicotine dependence, cigarettes, uncomplicated: Secondary | ICD-10-CM | POA: Diagnosis present

## 2019-02-11 DIAGNOSIS — Z818 Family history of other mental and behavioral disorders: Secondary | ICD-10-CM | POA: Diagnosis not present

## 2019-02-11 DIAGNOSIS — F418 Other specified anxiety disorders: Secondary | ICD-10-CM | POA: Diagnosis present

## 2019-02-11 DIAGNOSIS — J449 Chronic obstructive pulmonary disease, unspecified: Secondary | ICD-10-CM | POA: Diagnosis present

## 2019-02-11 DIAGNOSIS — E669 Obesity, unspecified: Secondary | ICD-10-CM | POA: Diagnosis present

## 2019-02-11 DIAGNOSIS — Z96642 Presence of left artificial hip joint: Secondary | ICD-10-CM | POA: Diagnosis present

## 2019-02-11 DIAGNOSIS — Z79899 Other long term (current) drug therapy: Secondary | ICD-10-CM | POA: Diagnosis not present

## 2019-02-11 DIAGNOSIS — M2342 Loose body in knee, left knee: Secondary | ICD-10-CM | POA: Diagnosis present

## 2019-02-11 DIAGNOSIS — G8929 Other chronic pain: Secondary | ICD-10-CM | POA: Diagnosis present

## 2019-02-11 DIAGNOSIS — Z6839 Body mass index (BMI) 39.0-39.9, adult: Secondary | ICD-10-CM | POA: Diagnosis not present

## 2019-02-11 DIAGNOSIS — Z96652 Presence of left artificial knee joint: Secondary | ICD-10-CM

## 2019-02-11 LAB — BASIC METABOLIC PANEL
Anion gap: 11 (ref 5–15)
BUN: 6 mg/dL (ref 6–20)
CO2: 26 mmol/L (ref 22–32)
Calcium: 8.6 mg/dL — ABNORMAL LOW (ref 8.9–10.3)
Chloride: 99 mmol/L (ref 98–111)
Creatinine, Ser: 0.64 mg/dL (ref 0.44–1.00)
GFR calc Af Amer: >60 mL/min (ref >60)
GFR calc non Af Amer: >60 mL/min (ref >60)
Glucose, Bld: 169 mg/dL — ABNORMAL HIGH (ref 70–99)
Potassium: 4.6 mmol/L (ref 3.5–5.1)
Sodium: 136 mmol/L (ref 135–145)

## 2019-02-11 LAB — CBC
HCT: 41.4 % (ref 36.0–46.0)
Hemoglobin: 13.8 g/dL (ref 12.0–15.0)
MCH: 29.6 pg (ref 26.0–34.0)
MCHC: 33.3 g/dL (ref 30.0–36.0)
MCV: 88.7 fL (ref 80.0–100.0)
Platelets: 309 10*3/uL (ref 150–400)
RBC: 4.67 MIL/uL (ref 3.87–5.11)
RDW: 14.6 % (ref 11.5–15.5)
WBC: 15.5 10*3/uL — ABNORMAL HIGH (ref 4.0–10.5)
nRBC: 0 % (ref 0.0–0.2)

## 2019-02-11 NOTE — Evaluation (Signed)
Occupational Therapy Evaluation Patient Details Name: Tiffany Lynch MRN: [id number] DOB: [birth year] Today's Date: 02/11/2019    History of Present Illness Pt is a 58 y/o female s/p L TKA. PMH includes tobacco use, drug abuse, COPD, bipolar disorder, and HTN.    Clinical Impression   Pt reports that she was mod I with RW prior to sx. Today she is mod A for bed mobility and transfer to recliner (unable to WB through LLE enough to lift RLE off the ground for steps), She is max A for LB ADL, min A due to lethargy for UB ADL. Pt will benefit from skilled OT in the acute setting as well as post-acute as determined by the MD.    Follow Up Recommendations  Follow surgeon's recommendation for DC plan and follow-up therapies;Supervision/Assistance - 24 hour    Equipment Recommendations  3 in 1 bedside commode    Recommendations for Other Services       Precautions / Restrictions Precautions Precautions: Knee Precaution Comments: Pt with external rotation at rest, educated that she needs to keep toes up to ceiling Restrictions Weight Bearing Restrictions: Yes LLE Weight Bearing: Weight bearing as tolerated      Mobility Bed Mobility Overal bed mobility: Needs Assistance Bed Mobility: Supine to Sit     Supine to sit: Mod assist;HOB elevated     General bed mobility comments: cues for sequencing, assist for LLE to EOB, Pt pulling on therapist to elevate trunk  Transfers Overall transfer level: Needs assistance Equipment used: Rolling walker (2 wheeled) Transfers: Sit to/from [facility] Sit to Stand: Mod assist;From elevated surface(bed) Stand pivot transfers: Mod assist       General transfer comment: Mod A for lift assist and steadying assist. Pt unable to lift RLE due to pain in LLE for steps of any kind, was able to heel toe and sit abruptly in chair despite cues for safety    Balance Overall balance assessment: Needs assistance Sitting-balance support: No  upper extremity supported;Feet supported Sitting balance-Leahy Scale: Fair     Standing balance support: Bilateral upper extremity supported;During functional activity Standing balance-Leahy Scale: Poor Standing balance comment: Reliant on BUE support                            ADL either performed or assessed with clinical judgement   ADL Overall ADL's : Needs assistance/impaired Eating/Feeding: Independent   Grooming: Sitting;Minimal assistance Grooming Details (indicate cue type and reason): Pt's ability impacted by lethargy Upper Body Bathing: Minimal assistance   Lower Body Bathing: Maximal assistance   Upper Body Dressing : Moderate assistance   Lower Body Dressing: Total assistance   Toilet Transfer: Moderate assistance;Stand-pivot;BSC;RW;Requires wide/bariatric Armed forces technical officer Details (indicate cue type and reason): Pt able to stand, but requires significant assist for pivot Toileting- Clothing Manipulation and Hygiene: Maximal assistance;Sit to/from stand Toileting - Clothing Manipulation Details (indicate cue type and reason): requires BUE support in standing     Functional mobility during ADLs: Moderate assistance;Rolling walker;Cueing for safety;Cueing for sequencing General ADL Comments: Pt with lethargy, decreased cognition     Vision         Perception     Praxis      Pertinent Vitals/Pain Pain Assessment: 0-10 Pain Score: 10-Worst pain ever Pain Location: L knee  Pain Descriptors / Indicators: Aching;Operative site guarding;Sharp;Moaning Pain Intervention(s): Limited activity within patient's tolerance;Monitored during session;Repositioned;Ice applied;Premedicated before session     Hand Dominance  Extremity/Trunk Assessment Upper Extremity Assessment Upper Extremity Assessment: Overall WFL for tasks assessed   Lower Extremity Assessment Lower Extremity Assessment: LLE deficits/detail LLE Deficits / Details: Deficits consistent  with post op pain and weakness.  LLE: Unable to fully assess due to pain LLE Sensation: WNL LLE Coordination: decreased gross motor   Cervical / Trunk Assessment Cervical / Trunk Assessment: Kyphotic   Communication Communication Communication: No difficulties   Cognition Arousal/Alertness: Lethargic;Suspect due to medications Behavior During Therapy: Flat affect Overall Cognitive Status: No family/caregiver present to determine baseline cognitive functioning                                 General Comments: Pt very sleepy throughout, required verbal cues throughout session to open eyes, Pt unable to recall what was going on.    General Comments  Pt's O2 started at 94 dropped to 90 on 3L    Exercises     Shoulder Instructions      Home Living Family/patient expects to be discharged to:: Private residence Living Arrangements: Non-relatives/Friends Available Help at Discharge: Friend(s);Available PRN/intermittently Type of Home: House Home Access: Stairs to enter [employer] of Steps: 2 Entrance Stairs-Rails: Right;Left;Can reach both Home Layout: One level     Bathroom Shower/Tub: Teacher, early years/pre: Standard     Home Equipment: Environmental consultant - 2 wheels          Prior Functioning/Environment Level of Independence: Independent with assistive device(s)        Comments: Used RW for ambulation         OT Problem List: Decreased strength;Decreased range of motion;Decreased activity tolerance;Impaired balance (sitting and/or standing);Decreased cognition;Decreased safety awareness;Decreased knowledge of use of DME or AE;Decreased knowledge of precautions;Obesity;Pain      OT Treatment/Interventions: Self-care/ADL training;DME and/or AE instruction;Therapeutic activities;Patient/family education;Balance training    OT Goals(Current goals can be found in the care plan section) Acute Rehab OT Goals Patient Stated Goal: for pain to  get better OT Goal Formulation: With patient Time For Goal Achievement: 02/25/19 Potential to Achieve Goals: Good ADL Goals Pt Will Perform Grooming: with modified independence;sitting Pt Will Perform Upper Body Dressing: with modified independence;sitting Pt Will Perform Lower Body Dressing: with min guard assist;sit to/from stand Pt Will Transfer to Toilet: with supervision;ambulating Pt Will Perform Toileting - Clothing Manipulation and hygiene: with modified independence;sitting/lateral leans Pt Will Perform Tub/Shower Transfer: Tub transfer;with supervision;ambulating;3 in 1;rolling walker Additional ADL Goal #1: Pt will perform bed mobility at mod I level prior to engaging in ADL  OT Frequency: Min 3X/week   Barriers to D/C: Decreased caregiver support  Pt has a roommate that is only available PRN       Co-evaluation              AM-PAC OT "6 Clicks" Daily Activity     Outcome Measure Help from another person eating meals?: None Help from another person taking care of personal grooming?: A Little Help from another person toileting, which includes using toliet, bedpan, or urinal?: A Lot Help from another person bathing (including washing, rinsing, drying)?: A Lot Help from another person to put on and taking off regular upper body clothing?: A Little Help from another person to put on and taking off regular lower body clothing?: A Lot 6 Click Score: 16   End of Session Equipment Utilized During Treatment: Gait belt;Rolling walker;Oxygen(3L) CPM Left Knee CPM Left Knee: Off Left  Knee Flexion (Degrees): 60 Left Knee Extension (Degrees): 0 Nurse Communication: Mobility status;Precautions;Weight bearing status  Activity Tolerance: Patient limited by lethargy;Patient limited by pain Patient left: in chair;with call bell/phone within reach;with chair alarm set  OT Visit Diagnosis: Unsteadiness on feet (R26.81);Other abnormalities of gait and mobility (R26.89);Muscle  weakness (generalized) (M62.81);Other symptoms and signs involving cognitive function;Pain Pain - Right/Left: Left Pain - part of body: Knee                Time: 1829-9371 OT Time Calculation (min): 29 min Charges:  OT General Charges $OT Visit: 1 Visit OT Evaluation $OT Eval Moderate Complexity: 1 Mod OT Treatments $Self Care/Home Management : 8-22 mins  Hulda Humphrey OTR/L Acute Rehabilitation Services Pager: [phone] Office: [place] 02/11/2019, 10:44 AM

## 2019-02-11 NOTE — Plan of Care (Signed)
  Problem: Activity: Goal: Ability to avoid complications of mobility impairment will improve Outcome: Progressing   Problem: Pain Management: Goal: Pain level will decrease with appropriate interventions Outcome: Progressing   Problem: Skin Integrity: Goal: Will show signs of wound healing Outcome: Progressing   

## 2019-02-11 NOTE — Progress Notes (Signed)
Pt unable to tolerate CPM @ 90, Nurse decrease it to 60 degrees and updated day shift nurse in increase as needed till it reach 90 degrees, thanks!

## 2019-02-11 NOTE — Progress Notes (Signed)
Physical Therapy Treatment Patient Details Name: Tiffany Lynch MRN: [id number] DOB: 1961-02-13 Today's Date: 02/11/2019    History of Present Illness Pt is a 58 y/o female s/p L TKA. PMH includes tobacco use, drug abuse, COPD, bipolar disorder, and HTN.     PT Comments    Pt performed gait training with good progression.  She continues to require max VCs to progress mobility.  Pt did advance gt to 12 ft with moderate assistance.  Pt stopping to rest in standing x 3 times with VCs to keep hands on hand grips of RW.  Pt performed limited exercises as her lunch tray was present in her room.  Pt more alert but continues to benefit from SNF placement at this time.  ROM 0- 50 degrees L knee.     Follow Up Recommendations  Follow surgeon's recommendation for DC plan and follow-up therapies;Supervision for mobility/OOB     Equipment Recommendations  3in1 (PT)    Recommendations for Other Services       Precautions / Restrictions Precautions Precautions: Knee Precaution Booklet Issued: No Precaution Comments: Pt with external rotation at rest, educated that she needs to keep toes up to ceiling, placed towel roll to avoid ER.   Restrictions Weight Bearing Restrictions: Yes LLE Weight Bearing: Weight bearing as tolerated    Mobility  Bed Mobility Overal bed mobility: Needs Assistance             General bed mobility comments: Pt standing at edge of bed attempting to get to recliner with NT and RN present.  PTA took over to progress gt training.    Transfers Overall transfer level: Needs assistance Equipment used: Rolling walker (2 wheeled) Transfers: Sit to/from Stand Sit to Stand: (pt already standing with +2 assistance from nursing.  PTA took over to progress ambulation.  )         General transfer comment: mod assistance to return to recliner with poor eccentric loading.    Ambulation/Gait Ambulation/Gait assistance: Mod assist;+2 safety/equipment Gait Distance (Feet):  12 Feet Assistive device: Rolling walker (2 wheeled) Gait Pattern/deviations: Step-to pattern;Decreased stance time - right;Decreased stride length;Decreased step length - right;Decreased step length - left;Antalgic;Trunk flexed     General Gait Details: Cues for sequencing and weight shifting. Before performing 6 ft trial she stood and attempted steps 2 different time and after experiencing pain she sat impulsively and refused further gt.  Pt required max assistance to stand for 3rd trial and progress gt to 6 ft.     Stairs             Wheelchair Mobility    Modified Rankin (Stroke Patients Only)       Balance Overall balance assessment: Needs assistance Sitting-balance support: No upper extremity supported;Feet supported Sitting balance-Leahy Scale: Fair     Standing balance support: Bilateral upper extremity supported;During functional activity Standing balance-Leahy Scale: Poor Standing balance comment: Reliant on BUE support                             Cognition Arousal/Alertness: Awake/alert Behavior During Therapy: Anxious Overall Cognitive Status: No family/caregiver present to determine baseline cognitive functioning                                 General Comments: Pt alert and able to keep eyes open.  Pt continues to require max VCs to progress  mobility.        Exercises General Exercises - Lower Extremity Ankle Circles/Pumps: AROM;Both;10 reps;Supine  Heel Slides: AAROM;10 reps;Left;Supine    General Comments        Pertinent Vitals/Pain Pain Assessment: Faces Pain Score: 5  Faces Pain Scale: Hurts little more Pain Location: with movement to L knee Pain Descriptors / Indicators: Aching;Operative site guarding;Sharp;Moaning Pain Intervention(s): Monitored during session;Repositioned;Ice applied    Home Living                      Prior Function            PT Goals (current goals can now be found in the  care plan section) Acute Rehab PT Goals Patient Stated Goal: for pain to get better Potential to Achieve Goals: Good Progress towards PT goals: Progressing toward goals    Frequency    7X/week      PT Plan Current plan remains appropriate    Co-evaluation              AM-PAC PT "6 Clicks" Mobility   Outcome Measure  Help needed turning from your back to your side while in a flat bed without using bedrails?: A Little Help needed moving from lying on your back to sitting on the side of a flat bed without using bedrails?: A Little Help needed moving to and from a bed to a chair (including a wheelchair)?: A Lot Help needed standing up from a chair using your arms (e.g., wheelchair or bedside chair)?: A Lot Help needed to walk in hospital room?: A Lot Help needed climbing 3-5 steps with a railing? : A Lot 6 Click Score: 14    End of Session Equipment Utilized During Treatment: Gait belt Activity Tolerance: Patient limited by pain Patient left: with call bell/phone within reach;in chair;with chair alarm set Nurse Communication: Mobility status PT Visit Diagnosis: Difficulty in walking, not elsewhere classified (R26.2);Muscle weakness (generalized) (M62.81);Pain Pain - Right/Left: Left Pain - part of body: Knee     Time: 1424-1440 PT Time Calculation (min) (ACUTE ONLY): 16 min  Charges:  $Gait Training: 8-22 mins                     Governor Rooks, PTA Acute Rehabilitation Services Pager [phone] Office [phone]     Alberta [place] Eli Hose 02/11/2019, 2:49 PM

## 2019-02-11 NOTE — Progress Notes (Signed)
CSW acknowledges SNF consult, will follow for discharge planning and PT recs. Potential SNF to be discussed with patient.  [place], [place]

## 2019-02-11 NOTE — Progress Notes (Signed)
   Subjective: 1 Day Post-Op Procedure(s) (LRB): LEFT TOTAL KNEE ARTHROPLASTY-CEMENTED (Left) Patient reports pain as severe.  Patient somulent and slurring her words from pain meds.   Objective: Vital signs in last 24 hours: Temp:  [97.2 F (36.2 C)-98.7 F (37.1 C)] 98.5 F (36.9 C) (06/04 0647) Pulse Rate:  [70-92] 84 (06/04 0647) Resp:  [15-23] 18 (06/04 0647) BP: (100-138)/(64-80) 108/77 (06/04 0647) SpO2:  [90 %-96 %] 95 % (06/04 0647) Weight:  [101.2 kg] 101.2 kg (06/03 1010)  Intake/Output from previous day: 06/03 0701 - 06/04 0700 In: 1293.9 [P.O.:120; I.V.:1126.6; IV Piggyback:47.3] Out: 3000 [Urine:2900; Blood:100] Intake/Output this shift: No intake/output data recorded.  Recent Labs    02/10/19 1004 02/11/19 0301  HGB 14.7 13.8   Recent Labs    02/10/19 1004 02/11/19 0301  WBC 12.7* 15.5*  RBC 5.09 4.67  HCT 45.5 41.4  PLT 334 309   Recent Labs    02/10/19 1004 02/11/19 0301  NA 140 136  K 4.0 4.6  CL 101 99  CO2 27 26  BUN 11 6  CREATININE 0.76 0.64  GLUCOSE 132* 169*  CALCIUM 9.3 8.6*   No results for input(s): LABPT, INR in the last 72 hours.  dressing intact.  Dg Knee 1-2 Views Left  Result Date: 02/10/2019 CLINICAL DATA:  Postop check EXAM: LEFT KNEE - 1-2 VIEW COMPARISON:  03/22/2018 FINDINGS: Left hip replacement is noted. No acute fracture or dislocation is seen. No soft tissue abnormality is noted. IMPRESSION: Status post left hip replacement. Electronically Signed   By: Inez Catalina M.D.   On: 02/10/2019 15:29    Assessment/Plan: 1 Day Post-Op Procedure(s) (LRB): LEFT TOTAL KNEE ARTHROPLASTY-CEMENTED (Left) Up with therapy  Marybelle Killings 02/11/2019, 7:52 AM

## 2019-02-11 NOTE — Progress Notes (Signed)
Physical Therapy Treatment Patient Details Name: Tiffany Lynch MRN: [id number] DOB: 28-Dec-1960 Today's Date: 02/11/2019    History of Present Illness Pt is a 58 y/o female s/p L TKA. PMH includes tobacco use, drug abuse, COPD, bipolar disorder, and HTN.     PT Comments    Pt performed gait training and functrional mobility during session with max VCs for encouragement.  Pt performed sit to stand x3 reps with moderate assistance. She presents with poor safety and motivation.  Pt sat impulsively x2 without warning she required max VCs for safety.  On 3rd standing trial she was able to advance gt to 6 ft with close chair follow.  Pt has the ability to clear R foot but mostly slides forward due to short stance time on left.  Guided patient through minimal HEP exercises.  Will return this pm to further progress mobility at this time.  Based on level of function SNF placement is appropriate to improve strength and function before returning home.      Follow Up Recommendations  Follow surgeon's recommendation for DC plan and follow-up therapies;Supervision for mobility/OOB     Equipment Recommendations  3in1 (PT)    Recommendations for Other Services       Precautions / Restrictions Precautions Precautions: Knee Precaution Booklet Issued: No Precaution Comments: Pt with external rotation at rest, educated that she needs to keep toes up to ceiling, placed towel roll to avoid ER.   Restrictions Weight Bearing Restrictions: Yes LLE Weight Bearing: Weight bearing as tolerated    Mobility  Bed Mobility Overal bed mobility: Needs Assistance Bed Mobility: Supine to Sit     Supine to sit: Mod assist;HOB elevated     General bed mobility comments: Pt in recliner on arrival.    Transfers Overall transfer level: Needs assistance Equipment used: Rolling walker (2 wheeled) Transfers: Sit to/from Stand Sit to Stand: Mod assist Stand pivot transfers: Mod assist       General transfer  comment: Cues for hand placement and forward weight shifting to achieve standing.  pt sat impulsively x2 to unlocked chair depsite cues for safety.  She has no awareness of her poor safety when mobilizing.    Ambulation/Gait Ambulation/Gait assistance: Mod assist;+2 safety/equipment Gait Distance (Feet): 6 Feet Assistive device: Rolling walker (2 wheeled) Gait Pattern/deviations: Step-to pattern;Decreased stance time - right;Decreased stride length;Decreased step length - right;Decreased step length - left;Antalgic;Trunk flexed     General Gait Details: Cues for sequencing and weight shifting. Before performing 6 ft trial she stood and attempted steps 2 different time and after experiencing pain she sat impulsively and refused further gt.  Pt required max assistance to stand for 3rd trial and progress gt to 6 ft.     Stairs             Wheelchair Mobility    Modified Rankin (Stroke Patients Only)       Balance Overall balance assessment: Needs assistance Sitting-balance support: No upper extremity supported;Feet supported Sitting balance-Leahy Scale: Fair     Standing balance support: Bilateral upper extremity supported;During functional activity Standing balance-Leahy Scale: Poor Standing balance comment: Reliant on BUE support                             Cognition Arousal/Alertness: Lethargic;Suspect due to medications Behavior During Therapy: Flat affect Overall Cognitive Status: No family/caregiver present to determine baseline cognitive functioning  General Comments: Pt having difficulty keeping her eyes open, presents with problem solving and decreased awareness of deficits.        Exercises General Exercises - Lower Extremity Ankle Circles/Pumps: AROM;Both;10 reps;Supine Quad Sets: AROM;Left;10 reps;Supine(trace movement and unable to follow commands to hold for isometric ) Long Arc Quad: AAROM;10  reps;Left;Supine Heel Slides: AAROM;10 reps;Left;Supine    General Comments General comments (skin integrity, edema, etc.): Pt's O2 started at 94 dropped to 90 on 3L      Pertinent Vitals/Pain Pain Assessment: Faces Pain Score: 10-Worst pain ever Faces Pain Scale: Hurts little more Pain Location: with movement to L knee Pain Descriptors / Indicators: Aching;Operative site guarding;Sharp;Moaning Pain Intervention(s): Monitored during session;Repositioned;Ice applied    Home Living Family/patient expects to be discharged to:: Private residence Living Arrangements: Non-relatives/Friends Available Help at Discharge: Friend(s);Available PRN/intermittently Type of Home: House Home Access: Stairs to enter Entrance Stairs-Rails: Right;Left;Can reach both Home Layout: One level Home Equipment: Environmental consultant - 2 wheels      Prior Function Level of Independence: Independent with assistive device(s)      Comments: Used RW for ambulation    PT Goals (current goals can now be found in the care plan section) Acute Rehab PT Goals Patient Stated Goal: for pain to get better Potential to Achieve Goals: Good Progress towards PT goals: Progressing toward goals    Frequency    7X/week      PT Plan Current plan remains appropriate    Co-evaluation              AM-PAC PT "6 Clicks" Mobility   Outcome Measure  Help needed turning from your back to your side while in a flat bed without using bedrails?: A Little Help needed moving from lying on your back to sitting on the side of a flat bed without using bedrails?: A Little Help needed moving to and from a bed to a chair (including a wheelchair)?: A Lot Help needed standing up from a chair using your arms (e.g., wheelchair or bedside chair)?: A Lot Help needed to walk in hospital room?: A Lot Help needed climbing 3-5 steps with a railing? : A Lot 6 Click Score: 14    End of Session Equipment Utilized During Treatment: Gait  belt Activity Tolerance: Patient limited by pain Patient left: in bed;with call bell/phone within reach;with bed alarm set Nurse Communication: Mobility status;Patient requests pain meds PT Visit Diagnosis: Difficulty in walking, not elsewhere classified (R26.2);Muscle weakness (generalized) (M62.81);Pain Pain - Right/Left: Left Pain - part of body: Knee     Time: 4287-6811 PT Time Calculation (min) (ACUTE ONLY): 28 min  Charges:  $Gait Training: 8-22 mins $Therapeutic Exercise: 8-22 mins                     Governor Rooks, PTA Acute Rehabilitation Services Pager [phone] Office [phone]     Charon Akamine Eli Hose 02/11/2019, 11:13 AM

## 2019-02-11 NOTE — Progress Notes (Signed)
Did poorly with PT/OT. Changed to inpatient. Only made 6 ft.

## 2019-02-11 NOTE — Progress Notes (Signed)
Pt unable to bear weight on the left leg and very unstedy when trying to void using bathroom and  bedside Commode.  Patient is alert and oriented x 2, had frequent voiding with incontinent episode. she tried to get out of bed several times without using the call light bottom, so nurse went ahead and put in [facility] for last night,  will update incoming nurse to take it off before therapy this morning, thanks.

## 2019-02-11 NOTE — Progress Notes (Signed)
Orthopedic Tech Progress Note Patient Details:  Tiffany Lynch 1961-06-06 [id number]  CPM Left Knee CPM Left Knee: On Left Knee Flexion (Degrees): 60 Left Knee Extension (Degrees): 0  Post Interventions Patient Tolerated: Well Instructions Provided: Care of device  Tiffany Lynch 02/11/2019, 12:40 PM

## 2019-02-12 LAB — CBC
HCT: 37.6 % (ref 36.0–46.0)
Hemoglobin: 12.3 g/dL (ref 12.0–15.0)
MCH: 28.7 pg (ref 26.0–34.0)
MCHC: 32.7 g/dL (ref 30.0–36.0)
MCV: 87.9 fL (ref 80.0–100.0)
Platelets: 269 10*3/uL (ref 150–400)
RBC: 4.28 MIL/uL (ref 3.87–5.11)
RDW: 14.9 % (ref 11.5–15.5)
WBC: 15.7 10*3/uL — ABNORMAL HIGH (ref 4.0–10.5)
nRBC: 0 % (ref 0.0–0.2)

## 2019-02-12 NOTE — TOC Initial Note (Addendum)
Transition of Care [facility]) - Initial/Assessment Note    Patient Details  Name: Tiffany Lynch MRN: [id number] Date of Birth: Oct 24, 1960  Transition of Care [facility]) CM/SW Contact:    Alberteen Sam, [place] Phone Number: [phone] 02/12/2019, 1:53 PM  Clinical Narrative:                  CSW consulted with patient regarding potential SNF placement at time of discharge. Patient was very irritable and reported she was not going to SNF she was going home. She reports she is going home tomorrow. Patient reports she is in a lot of pain. CSW informed patient that SNF is recommended as it appears patient continues to struggle with mobility according to PT notes. Patient reports it is due to the hardwood floors and that she has carpet at home which will be better for her. CSW inquired about support at home, patient reports she can set up people to stay with her but would not inform CSW of who the people are. Patient became upset and inquired if CSW has spoke to her mom who is "useless" and who will not "get up off her fat ass to help me with anything". CSW informed patient that CSW has not been in contact with her mother as CSW needs patient's permission for that. Patient does not give CSW permission to contact any family at this time. Patient continues to report she will go home at discharge. CSW encouraged patient to continue to work with PT if that is her goal to return home.   **Please also note patient is Medicaid and would need to relinquish Medicaid check to any SNF facility and stay for 30 days at facility if she becomes agreeable.   ** Patient's substance abuse history and Bipolar Disorder potential barriers to placement if becomes agreeable.   Expected Discharge Plan: [place] Barriers to Discharge: Continued Medical Work up   Patient Goals and CMS Choice Patient states their goals for this hospitalization and ongoing recovery are:: to go home CMS Medicare.gov Compare Post Acute  Care list provided to:: Patient Choice offered to / list presented to : Patient  Expected Discharge Plan and Services Expected Discharge Plan: [place]       Living arrangements for the past 2 months: Single Family Home                                      Prior Living Arrangements/Services Living arrangements for the past 2 months: Single Family Home Lives with:: Self Patient language and need for interpreter reviewed:: Yes Do you feel safe going back to the place where you live?: Yes      Need for Family Participation in Patient Care: No (Comment) Care giver support system in place?: Yes (comment)   Criminal Activity/Legal Involvement Pertinent to Current Situation/Hospitalization: No - Comment as needed  Activities of Daily Living Home Assistive Devices/Equipment: None ADL Screening (condition at time of admission) Patient's cognitive ability adequate to safely complete daily activities?: Yes Is the patient deaf or have difficulty hearing?: No Does the patient have difficulty seeing, even when wearing glasses/contacts?: No Does the patient have difficulty concentrating, remembering, or making decisions?: No Patient able to express need for assistance with ADLs?: Yes Does the patient have difficulty dressing or bathing?: No Independently performs ADLs?: Yes (appropriate for developmental age) Does the patient have difficulty  walking or climbing stairs?: Yes Weakness of Legs: Both Weakness of Arms/Hands: Both  Permission Sought/Granted Permission sought to share information with : Case Manager, Customer service manager                Emotional Assessment Appearance:: Appears stated age Attitude/Demeanor/Rapport: Engaged, Complaining, Suspicious Affect (typically observed): Overwhelmed, Irritable Orientation: : Oriented to Self, Oriented to Place, Oriented to Situation, Oriented to  Time Alcohol / Substance Use: Not Applicable Psych  Involvement: No (comment)  Admission diagnosis:  Arthritis of left knee [M17.12] Patient Active Problem List   Diagnosis Date Noted  . S/P total knee arthroplasty, left 02/11/2019  . Unilateral primary osteoarthritis, left knee 02/10/2019  . Arthritis of left knee 02/10/2019  . Rib fractures 07/01/2017  . Tobacco dependence 07/01/2017  . Derangement of posterior horn of medial meniscus of left knee   . Loose body of left knee   . Hypotension 08/14/2016  . Respiratory failure with hypoxia ([place]) 08/14/2016  . Hyperglycemia 08/14/2016  . Alcohol abuse 08/14/2016  . Cocaine abuse ([place]) 08/14/2016  . Chronic pain 08/14/2016  . Oral thrush 08/14/2016  . Hypokalemia 05/31/2016  . Pre-syncope 05/31/2016  . Current smoker 07/20/2015  . Urge and stress incontinence 07/14/2015  . Hyperlipidemia 01/02/2015  . Insomnia 01/02/2015  . OAB (overactive bladder) 01/02/2015  . Depression 01/02/2015  . GERD (gastroesophageal reflux disease) 01/02/2015  . ARF (acute renal failure) ([place]) 12/21/2013  . COPD (chronic obstructive pulmonary disease) ([place]) 05/22/2012  . Dysfunctional uterine bleeding 04/01/2012  . Orthostatic hypotension 03/31/2012  . Encephalopathy acute 03/30/2012  . Chronic back pain   . Bipolar 1 disorder ([place])   . Anxiety    PCP:  Adaline Sill, NP Pharmacy:   [facility], [place] - [address] [place]  [address] Phone: [phone] Fax: [phone]  [id number] - [place], [place] [address] Phone: [phone] Fax: [phone]     Social Determinants of Health (SDOH) Interventions    Readmission Risk Interventions No flowsheet data found.

## 2019-02-12 NOTE — Progress Notes (Signed)
Orthopedic Tech Progress Note Patient Details:  Tiffany Lynch Jan 21, 1961 [id number]  Ortho Devices Type of Ortho Device: Bone foam zero knee Ortho Device/Splint Location: LLE Ortho Device/Splint Interventions: Application, Adjustment, Ordered   Post Interventions Patient Tolerated: Well Instructions Provided: Care of device, Adjustment of device   Janit Pagan 02/12/2019, 2:39 PM

## 2019-02-12 NOTE — Plan of Care (Signed)
  Problem: Pain Management: °Goal: Pain level will decrease with appropriate interventions °Outcome: Progressing °  °Problem: Activity: °Goal: Ability to avoid complications of mobility impairment will improve °Outcome: Progressing °  °

## 2019-02-12 NOTE — Progress Notes (Signed)
Tried to ambulate patient to bathroom, tolerated poorly. Pt was able to dangle and stand at the bedside. Bed exit alarm maintained and phone in reach. Pt with occasional incontinence, PW kept in place. Pox sats at 87% when pt sleeps, applied O2 2L nasal cannula. Pox sats between 91-96%

## 2019-02-12 NOTE — Progress Notes (Signed)
Left left placed in Bone Foam zero knee

## 2019-02-12 NOTE — NC FL2 (Signed)
[place] LEVEL OF CARE SCREENING TOOL     IDENTIFICATION  Patient Name: Tiffany Lynch Birthdate: 1960-09-14 Sex: female Admission Date (Current Location): 02/10/2019  [facility] and [place] Number:  [employer] and Address:  [facility] [place], [place] [address] [place], [place] [address]      Provider Number: [id number]  Attending Physician Name and Address:  Marybelle Killings, MD  Relative Name and Phone Number:  Jeanett Schlein (mother) [phone]    Current Level of Care: Hospital Recommended Level of Care: [place] Prior Approval Number:    Date Approved/Denied:   PASRR Number: (under review)  Discharge Plan: SNF    Current Diagnoses: Patient Active Problem List   Diagnosis Date Noted  . S/P total knee arthroplasty, left 02/11/2019  . Unilateral primary osteoarthritis, left knee 02/10/2019  . Arthritis of left knee 02/10/2019  . Rib fractures 07/01/2017  . Tobacco dependence 07/01/2017  . Derangement of posterior horn of medial meniscus of left knee   . Loose body of left knee   . Hypotension 08/14/2016  . Respiratory failure with hypoxia ([place]) 08/14/2016  . Hyperglycemia 08/14/2016  . Alcohol abuse 08/14/2016  . Cocaine abuse ([place]) 08/14/2016  . Chronic pain 08/14/2016  . Oral thrush 08/14/2016  . Hypokalemia 05/31/2016  . Pre-syncope 05/31/2016  . Current smoker 07/20/2015  . Urge and stress incontinence 07/14/2015  . Hyperlipidemia 01/02/2015  . Insomnia 01/02/2015  . OAB (overactive bladder) 01/02/2015  . Depression 01/02/2015  . GERD (gastroesophageal reflux disease) 01/02/2015  . ARF (acute renal failure) ([place]) 12/21/2013  . COPD (chronic obstructive pulmonary disease) ([place]) 05/22/2012  . Dysfunctional uterine bleeding 04/01/2012  . Orthostatic hypotension 03/31/2012  . Encephalopathy acute 03/30/2012  . Chronic back pain   . Bipolar 1 disorder ([place])   . Anxiety     Orientation RESPIRATION BLADDER  Height & Weight     Self, Time, Situation, Place  O2(nasal cannula 2-3 L/min) Incontinent, External catheter Weight: 223 lb (101.2 kg) Height:  5\' 3"  (160 cm)  BEHAVIORAL SYMPTOMS/MOOD NEUROLOGICAL BOWEL NUTRITION STATUS      Continent Diet(see discharge summary)  AMBULATORY STATUS COMMUNICATION OF NEEDS Skin   Extensive Assist Verbally Other (Comment)(right knee closed surgical incision)                       Personal Care Assistance Level of Assistance  Bathing, Feeding, Dressing, Total care Bathing Assistance: Limited assistance Feeding assistance: Independent Dressing Assistance: Limited assistance Total Care Assistance: Maximum assistance   Functional Limitations Info  Sight, Hearing, Speech Sight Info: Adequate Hearing Info: Adequate Speech Info: Adequate    SPECIAL CARE FACTORS FREQUENCY  PT (By licensed PT), OT (By licensed OT)     PT Frequency: min 5x weekly OT Frequency: min 5x weekly            Contractures Contractures Info: Not present    Additional Factors Info  Code Status, Allergies Code Status Info: full Allergies Info: No Known Allergies           Current Medications (02/12/2019):  This is the current hospital active medication list Current Facility-Administered Medications  Medication Dose Route Frequency Provider Last Rate Last Dose  . 0.9 %  sodium chloride infusion   Intravenous Continuous Lanae Crumbly, PA-C 85 mL/hr at 02/11/19 2952    . acetaminophen (TYLENOL) tablet 325-650 mg  325-650 mg Oral Q6H PRN Lanae Crumbly, PA-C   650 mg  at 02/11/19 1605  . albuterol (PROVENTIL) (2.5 MG/3ML) 0.083% nebulizer solution 2.5 mg  2.5 mg Nebulization BID Lanae Crumbly, PA-C   2.5 mg at 02/11/19 1109  . amLODipine (NORVASC) tablet 10 mg  10 mg Oral Daily Lanae Crumbly, PA-C   10 mg at 02/12/19 0940  . aspirin EC tablet 325 mg  325 mg Oral Q breakfast Lanae Crumbly, PA-C   325 mg at 02/12/19 7782  . bupivacaine liposome (EXPAREL) 1.3 % injection  266 mg  20 mL Infiltration Once Marybelle Killings, MD      . Buprenorphine HCl FILM 300 mg  300 mg Sublingual BID Benjiman Core M, PA-C      . buPROPion (WELLBUTRIN XL) 24 hr tablet 300 mg  300 mg Oral q morning - 10a Lanae Crumbly, PA-C   300 mg at 02/12/19 4235  . busPIRone (BUSPAR) tablet 15 mg  15 mg Oral TID Lanae Crumbly, PA-C   15 mg at 02/12/19 0940  . clonazePAM (KLONOPIN) tablet 1 mg  1 mg Oral TID Lanae Crumbly, PA-C   1 mg at 02/12/19 3614  . docusate sodium (COLACE) capsule 100 mg  100 mg Oral BID Lanae Crumbly, PA-C   100 mg at 02/12/19 4315  . doxepin (SINEQUAN) capsule 75 mg  75 mg Oral QHS Marybelle Killings, MD   75 mg at 02/11/19 2159  . DULoxetine (CYMBALTA) DR capsule 60 mg  60 mg Oral BID Lanae Crumbly, PA-C   60 mg at 02/12/19 0940  . fluticasone furoate-vilanterol (BREO ELLIPTA) 100-25 MCG/INH 1 puff  1 puff Inhalation Daily Marybelle Killings, MD   1 puff at 02/11/19 1109   And  . umeclidinium bromide (INCRUSE ELLIPTA) 62.5 MCG/INH 1 puff  1 puff Inhalation Daily Marybelle Killings, MD   1 puff at 02/11/19 1108  . lactated ringers infusion   Intravenous Continuous Lillia Abed, MD 10 mL/hr at 02/10/19 1028    . menthol-cetylpyridinium (CEPACOL) lozenge 3 mg  1 lozenge Oral PRN Lanae Crumbly, PA-C       Or  . phenol (CHLORASEPTIC) mouth spray 1 spray  1 spray Mouth/Throat PRN Lanae Crumbly, PA-C      . methocarbamol (ROBAXIN) tablet 500 mg  500 mg Oral Q6H PRN Lanae Crumbly, PA-C   500 mg at 02/12/19 4008  . metoCLOPramide (REGLAN) tablet 5-10 mg  5-10 mg Oral Q8H PRN Lanae Crumbly, PA-C       Or  . metoCLOPramide (REGLAN) injection 5-10 mg  5-10 mg Intravenous Q8H PRN Benjiman Core M, PA-C      . metoprolol tartrate (LOPRESSOR) tablet 25 mg  25 mg Oral BID Lanae Crumbly, PA-C   25 mg at 02/12/19 0940  . olopatadine (PATANOL) 0.1 % ophthalmic solution 1 drop  1 drop Both Eyes Daily Lanae Crumbly, PA-C   1 drop at 02/12/19 0947  . ondansetron (ZOFRAN) tablet 4 mg  4 mg Oral Q6H PRN  Lanae Crumbly, PA-C       Or  . ondansetron [facility]) injection 4 mg  4 mg Intravenous Q6H PRN Lanae Crumbly, PA-C      . oxybutynin (DITROPAN-XL) 24 hr tablet 5 mg  5 mg Oral Daily Lanae Crumbly, PA-C   5 mg at 02/12/19 6761  . oxyCODONE (Oxy IR/ROXICODONE) immediate release tablet 5-10 mg  5-10 mg Oral Q4H PRN Lanae Crumbly, PA-C   10 mg  at 02/12/19 0712  . pantoprazole (PROTONIX) EC tablet 40 mg  40 mg Oral Daily Lanae Crumbly, PA-C   40 mg at 02/12/19 0941  . polyethylene glycol (MIRALAX / GLYCOLAX) packet 17 g  17 g Oral Daily PRN Lanae Crumbly, PA-C      . risperiDONE (RISPERDAL) tablet 1 mg  1 mg Oral QHS Benjiman Core M, PA-C   1 mg at 02/11/19 2159  . tranexamic acid (CYKLOKAPRON) 2,000 mg in sodium chloride 0.9 % 50 mL Topical Application  1,975 mg Topical Once Marybelle Killings, MD         Discharge Medications: Please see discharge summary for a list of discharge medications.  Relevant Imaging Results:  Relevant Lab Results:   Additional Information SSN: [id number]  Alberteen Sam, LCSW

## 2019-02-12 NOTE — Progress Notes (Signed)
Subjective: C/o left knee pain.  Moving slow with PT.  Has not ambulated in hall yet.  States that she has limited assistance at home.  Denies cp, sob.   Objective: Vital signs in last 24 hours: Temp:  [98.2 F (36.8 C)-98.8 F (37.1 C)] 98.2 F (36.8 C) (06/05 0217) Pulse Rate:  [81-89] 88 (06/05 0940) Resp:  [15-18] 15 (06/05 0217) BP: (95-130)/(56-110) 130/90 (06/05 0940) SpO2:  [87 %-95 %] 95 % (06/05 0217)  Intake/Output from previous day: 06/04 0701 - 06/05 0700 In: 1320.8 [P.O.:600; I.V.:720.8] Out: 600 [Urine:600] Intake/Output this shift: Total I/O In: 120 [P.O.:120] Out: -   Recent Labs    02/10/19 1004 02/11/19 0301 02/12/19 0121  HGB 14.7 13.8 12.3   Recent Labs    02/11/19 0301 02/12/19 0121  WBC 15.5* 15.7*  RBC 4.67 4.28  HCT 41.4 37.6  PLT 309 269   Recent Labs    02/10/19 1004 02/11/19 0301  NA 140 136  K 4.0 4.6  CL 101 99  CO2 27 26  BUN 11 6  CREATININE 0.76 0.64  GLUCOSE 132* 169*  CALCIUM 9.3 8.6*   No results for input(s): LABPT, INR in the last 72 hours.  Exam: Pleasant female. Alert and oriented.  NAD.  Knee wound looks good.  Lacks about 10-15 deg full extension.  Staples intact.  No drainage or signs of infection.  Calf nontender. Nvi.     Assessment/Plan: Ordered zero degree knee bone foam 1-2 hours TID as tolerated.  Moving slow with PT.  Hopefully will make more progress over weekend so she can discharge home and not need SNF/rehab.  Again she states that she has limited reliable help at home.      Benjiman Core 02/12/2019, 2:24 PM

## 2019-02-12 NOTE — Progress Notes (Signed)
Physical Therapy Treatment Patient Details Name: Tiffany Lynch MRN: [id number] DOB: 03-Jul-1961 Today's Date: 02/12/2019    History of Present Illness Pt is a 58 y/o female s/p L TKA. PMH includes tobacco use, drug abuse, COPD, bipolar disorder, and HTN.     PT Comments    Pt supine in bed on arrival with L knee flexed and L hip ER.  Pt incontinent and lying in a ring of urine.  Pt guided through supine LE exercises and ROM of L knee assessed.  Pt transferred to standing to perform perianal care.  Pt stood min +2 and require mod +2 for stand pivot from bed to recliner.  Pt unable to advance gait due to self limiting behavior.  Pt reports," I can't because it hurts."  Pt appears uncomfortable but pain she is describing does not match her demeanor.  Will return this pm to progress gt distance.    Follow Up Recommendations  Follow surgeon's recommendation for DC plan and follow-up therapies;Supervision for mobility/OOB     Equipment Recommendations  3in1 (PT)    Recommendations for Other Services       Precautions / Restrictions Precautions Precautions: Knee Precaution Booklet Issued: No Precaution Comments: Pt with external rotation at rest, educated that she needs to keep toes up to ceiling, placed towel roll to avoid ER.   Restrictions Weight Bearing Restrictions: No LLE Weight Bearing: Weight bearing as tolerated    Mobility  Bed Mobility Overal bed mobility: Needs Assistance Bed Mobility: Supine to Sit     Supine to sit: Mod assist;HOB elevated     General bed mobility comments: Pt required assistance to advance B LEs to edge of bed and to elevate trunk into sitting.    Transfers Overall transfer level: Needs assistance Equipment used: Rolling walker (2 wheeled) Transfers: Sit to/from Stand Sit to Stand: Min assist;+2 physical assistance Stand pivot transfers: Mod assist;+2 physical assistance       General transfer comment: Pt stood with min +2 slow and guarded  to achieve standing.  Pt able to stand for perianal care.  Pt started to take steps but tried to sit impulsively several times and required max VCs to return to standing.  Pt with poor step length and poor ability to progress steps.  Required chair to be brought under her bottom before stand pivot was complete.    Ambulation/Gait Ambulation/Gait assistance: Mod assist;+2 physical assistance Gait Distance (Feet): 4 Feet Assistive device: Rolling walker (2 wheeled) Gait Pattern/deviations: Step-to pattern;Decreased stance time - right;Decreased stride length;Decreased step length - right;Decreased step length - left;Antalgic;Trunk flexed;Shuffle     General Gait Details: Short shuffling pattern with poor endurance and self limiting behavior to progress.     Stairs             Wheelchair Mobility    Modified Rankin (Stroke Patients Only)       Balance Overall balance assessment: Needs assistance   Sitting balance-Leahy Scale: Fair       Standing balance-Leahy Scale: Poor                              Cognition Arousal/Alertness: Awake/alert Behavior During Therapy: Anxious Overall Cognitive Status: No family/caregiver present to determine baseline cognitive functioning                                 General Comments:  Pt alert and able to keep eyes open.  Pt continues to require max VCs to progress mobility.        Exercises Total Joint Exercises Ankle Circles/Pumps: AROM;Both;20 reps;Supine Quad Sets: AROM;Left;5 reps;Supine;Limitations Quad Sets Limitations: trace contractions Heel Slides: Left;10 reps;AAROM;Supine Hip ABduction/ADduction: AAROM;Left;10 reps;Supine Straight Leg Raises: AAROM;10 reps;Supine;Left Goniometric ROM: 18-74 degrees L knee.      General Comments        Pertinent Vitals/Pain Pain Assessment: Faces Faces Pain Scale: Hurts little more(once movement stops appears to be in no pain.  ) Pain Location: with  movement to L knee Pain Descriptors / Indicators: Aching;Operative site guarding;Sharp;Moaning Pain Intervention(s): Monitored during session;Repositioned    Home Living                      Prior Function            PT Goals (current goals can now be found in the care plan section) Acute Rehab PT Goals Patient Stated Goal: for pain to get better Potential to Achieve Goals: Good Progress towards PT goals: Progressing toward goals    Frequency    7X/week      PT Plan Current plan remains appropriate    Co-evaluation              AM-PAC PT "6 Clicks" Mobility   Outcome Measure  Help needed turning from your back to your side while in a flat bed without using bedrails?: A Little Help needed moving from lying on your back to sitting on the side of a flat bed without using bedrails?: A Little Help needed moving to and from a bed to a chair (including a wheelchair)?: A Lot Help needed standing up from a chair using your arms (e.g., wheelchair or bedside chair)?: A Lot Help needed to walk in hospital room?: A Lot Help needed climbing 3-5 steps with a railing? : A Lot 6 Click Score: 14    End of Session Equipment Utilized During Treatment: Gait belt Activity Tolerance: Patient limited by pain(self limiting behavior) Patient left: with call bell/phone within reach;in chair;with chair alarm set Nurse Communication: Mobility status PT Visit Diagnosis: Difficulty in walking, not elsewhere classified (R26.2);Muscle weakness (generalized) (M62.81);Pain Pain - Right/Left: Left Pain - part of body: Knee     Time: 7893-8101 PT Time Calculation (min) (ACUTE ONLY): 31 min  Charges:  $Therapeutic Exercise: 8-22 mins $Therapeutic Activity: 8-22 mins                     Tiffany Lynch, PTA Acute Rehabilitation Services Pager [phone] Office [phone]     Tiffany Lynch 02/12/2019, 11:44 AM

## 2019-02-12 NOTE — Progress Notes (Signed)
Physical Therapy Treatment Patient Details Name: Tiffany Lynch MRN: [id number] DOB: Jun 17, 1961 Today's Date: 02/12/2019    History of Present Illness Pt is a 57 y/o female s/p L TKA. PMH includes tobacco use, drug abuse, COPD, bipolar disorder, and HTN.     PT Comments    Pt continues to refuse gait training despite education and encouragement.  She was heavily re-educated on the benefits of mobility and she lacks insight to improving her function.  Will continue efforts to progress mobility.  Pt did tolerate transfer from bed to recliner and knee flexion exercises.  Positioned L hip to neutral as she continues to rest in ER with knee flexed.  Based on her lack of progress SNF placement remains most appropriate.      Follow Up Recommendations  Follow surgeon's recommendation for DC plan and follow-up therapies;Supervision for mobility/OOB     Equipment Recommendations  3in1 (PT)    Recommendations for Other Services       Precautions / Restrictions Precautions Precautions: Knee Precaution Booklet Issued: No Precaution Comments: Pt with external rotation at rest, educated that she needs to keep toes up to ceiling, placed towel roll to avoid ER.   Restrictions Weight Bearing Restrictions: No LLE Weight Bearing: Weight bearing as tolerated    Mobility  Bed Mobility Overal bed mobility: Needs Assistance Bed Mobility: Sit to Supine     Supine to sit: Mod assist;HOB elevated Sit to supine: Max assist   General bed mobility comments: Pt seated edge of bed on arrival attempting OOB to find her remote.  PT entered room and she was attempting to lie back down.  PTA educated on the benefits of mobility and encouraged patient to get back OOB and sit in the recliner.    Transfers Overall transfer level: Needs assistance Equipment used: Rolling walker (2 wheeled) Transfers: Sit to/from Stand Sit to Stand: Min assist Stand pivot transfers: Mod assist       General transfer  comment: Cues for hand placement to and from seated surface.  Once in standing patient took one step toward chair and flexed to reach for recliner.  Pt required cues to stand upright and turn toward chair before she is to sit down.  pt took one more step and was not aligned with chair she started to sit and scooted her hip to complete pivot.  Pt educated this is not safe, she lacks safety awareness.    Ambulation/Gait Ambulation/Gait assistance: (Refused gait training.  ) Gait Distance (Feet): 4 Feet Assistive device: Rolling walker (2 wheeled) Gait Pattern/deviations: Step-to pattern;Decreased stance time - right;Decreased stride length;Decreased step length - right;Decreased step length - left;Antalgic;Trunk flexed;Shuffle     General Gait Details: Short shuffling pattern with poor endurance and self limiting behavior to progress.     Stairs             Wheelchair Mobility    Modified Rankin (Stroke Patients Only)       Balance Overall balance assessment: Needs assistance   Sitting balance-Leahy Scale: Fair       Standing balance-Leahy Scale: Poor Standing balance comment: Reliant on BUE support, sits impulsively.                              Cognition Arousal/Alertness: Awake/alert Behavior During Therapy: WFL for tasks assessed/performed;Agitated Overall Cognitive Status: Within Functional Limits for tasks assessed  General Comments: Pt reports," I will not walk today, I want to go home."  Pt educated on the benefits of mobility but she continues to make poor decisions in her rehab and self limits her progress.        Exercises Total Joint Exercises Ankle Circles/Pumps: AROM;Both;20 reps;Supine Long Arc Quad: AAROM;10 reps;Left;Seated Goniometric ROM: 18-74 degrees L knee.      General Comments        Pertinent Vitals/Pain Pain Assessment: 0-10 Pain Score: 10-Worst pain ever Faces Pain Scale: Hurts  little more(once movement stops appears to be in no pain.  ) Pain Location: with movement to L knee Pain Descriptors / Indicators: Aching;Operative site guarding;Sharp;Moaning Pain Intervention(s): Monitored during session;Repositioned;RN gave pain meds during session;Patient requesting pain meds-RN notified    Home Living                      Prior Function            PT Goals (current goals can now be found in the care plan section) Acute Rehab PT Goals Patient Stated Goal: for pain to get better Potential to Achieve Goals: Good Progress towards PT goals: Progressing toward goals    Frequency    7X/week      PT Plan Current plan remains appropriate    Co-evaluation              AM-PAC PT "6 Clicks" Mobility   Outcome Measure  Help needed turning from your back to your side while in a flat bed without using bedrails?: A Little Help needed moving from lying on your back to sitting on the side of a flat bed without using bedrails?: A Little Help needed moving to and from a bed to a chair (including a wheelchair)?: A Little Help needed standing up from a chair using your arms (e.g., wheelchair or bedside chair)?: A Lot Help needed to walk in hospital room?: A Little Help needed climbing 3-5 steps with a railing? : A Little 6 Click Score: 17    End of Session Equipment Utilized During Treatment: Gait belt Activity Tolerance: Patient limited by pain;Treatment limited secondary to agitation(self limiting behavior) Patient left: with call bell/phone within reach;in chair;with chair alarm set Nurse Communication: Mobility status PT Visit Diagnosis: Difficulty in walking, not elsewhere classified (R26.2);Muscle weakness (generalized) (M62.81);Pain Pain - Right/Left: Left Pain - part of body: Knee     Time: 9924-2683 PT Time Calculation (min) (ACUTE ONLY): 21 min  Charges:  $Therapeutic Activity: 8-22 mins                     Governor Rooks, PTA Acute  Rehabilitation Services Pager [phone] Office [phone]     Tiffany Lynch 02/12/2019, 2:25 PM

## 2019-02-12 NOTE — Progress Notes (Signed)
Occupational Therapy Treatment Patient Details Name: Tiffany Lynch MRN: [id number] DOB: 03/02/1961 Today's Date: 02/12/2019    History of present illness Pt is a 58 y/o female s/p L TKA. PMH includes tobacco use, drug abuse, COPD, bipolar disorder, and HTN.    OT comments  Pt. Seen for skilled OT. Simulated toilet transfer with recliner to eob. Pt. Lethargic and also impulsive trying to sit before pivot transfer complete.  Max cues and assistance to complete transfer and bed mobility.    Follow Up Recommendations  Follow surgeon's recommendation for DC plan and follow-up therapies;Supervision/Assistance - 24 hour    Equipment Recommendations  3 in 1 bedside commode    Recommendations for Other Services      Precautions / Restrictions Precautions Precautions: Knee Precaution Booklet Issued: No Precaution Comments: Pt with external rotation at rest, educated that she needs to keep toes up to ceiling, placed towel roll to avoid ER.   Restrictions Weight Bearing Restrictions: No LLE Weight Bearing: Weight bearing as tolerated       Mobility Bed Mobility Overal bed mobility: Needs Assistance Bed Mobility: Sit to Supine     Supine to sit: Mod assist;HOB elevated Sit to supine: Max assist   General bed mobility comments: Pt required assistance x2 to advance B LEs into bed and pull pt. up in bed  Transfers Overall transfer level: Needs assistance Equipment used: Rolling walker (2 wheeled) Transfers: Sit to/from [facility] Sit to Stand: Mod assist Stand pivot transfers: Mod assist;Max assist       General transfer comment: Pt stood with min +2 slow and guarded to achieve standing.  Pt able to stand for perianal care.  Pt started to take steps but tried to sit impulsively several times and required max VCs to return to standing.  Pt with poor step length and poor ability to progress steps.  Required chair to be brought under her bottom before stand pivot was  complete.      Balance Overall balance assessment: Needs assistance   Sitting balance-Leahy Scale: Fair       Standing balance-Leahy Scale: Poor                             ADL either performed or assessed with clinical judgement   ADL Overall ADL's : Needs assistance/impaired                         Toilet Transfer: Moderate assistance;Stand-pivot;RW Toilet Transfer Details (indicate cue type and reason): Pt able to stand, but requires significant assist for pivot (simulated with transfer from recliner to eob), attempted to sit when she wasnt to the bed yet, max physical assistance to lift her off of the chair rail and transfer to [place] and Hygiene: Maximal assistance;Sit to/from stand Toileting - Clothing Manipulation Details (indicate cue type and reason): requires BUE support in standing-simulated during transfer     Functional mobility during ADLs: Moderate assistance;Rolling walker;Cueing for safety;Cueing for sequencing       Vision       Perception     Praxis      Cognition Arousal/Alertness: Lethargic Behavior During Therapy: Flat affect Overall Cognitive Status: Within Functional Limits for tasks assessed                                 General  Comments: Pt alert and able to keep eyes open.  Pt continues to require max VCs to progress mobility.          Exercises Total Joint Exercises Ankle Circles/Pumps: AROM;Both;20 reps;Supine Quad Sets: AROM;Left;5 reps;Supine;Limitations Quad Sets Limitations: trace contractions Heel Slides: Left;10 reps;AAROM;Supine Hip ABduction/ADduction: AAROM;Left;10 reps;Supine Straight Leg Raises: AAROM;10 reps;Supine;Left Goniometric ROM: 18-74 degrees L knee.     Shoulder Instructions       General Comments      Pertinent Vitals/ Pain       Pain Assessment: No/denies pain Faces Pain Scale: Hurts little more(once movement stops appears to be in no  pain.  ) Pain Location: with movement to L knee Pain Descriptors / Indicators: Aching;Operative site guarding;Sharp;Moaning Pain Intervention(s): Monitored during session;Repositioned  Home Living                                          Prior Functioning/Environment              Frequency  Min 3X/week        Progress Toward Goals  OT Goals(current goals can now be found in the care plan section)  Progress towards OT goals: Progressing toward goals  Acute Rehab OT Goals Patient Stated Goal: for pain to get better  Plan      Co-evaluation                 AM-PAC OT "6 Clicks" Daily Activity     Outcome Measure   Help from another person eating meals?: None Help from another person taking care of personal grooming?: A Little Help from another person toileting, which includes using toliet, bedpan, or urinal?: A Lot Help from another person bathing (including washing, rinsing, drying)?: A Lot Help from another person to put on and taking off regular upper body clothing?: A Little Help from another person to put on and taking off regular lower body clothing?: A Lot 6 Click Score: 16    End of Session Equipment Utilized During Treatment: Gait belt;Rolling walker  OT Visit Diagnosis: Unsteadiness on feet (R26.81);Other abnormalities of gait and mobility (R26.89);Muscle weakness (generalized) (M62.81);Other symptoms and signs involving cognitive function;Pain Pain - Right/Left: Left Pain - part of body: Knee   Activity Tolerance Patient limited by lethargy   Patient Left in bed;with call bell/phone within reach;with bed alarm set   Nurse Communication Other (comment)(CNA assisted with bed mobility and placement of pure wik)        Time: 0034-9179 OT Time Calculation (min): 11 min  Charges: OT General Charges $OT Visit: 1 Visit OT Treatments $Self Care/Home Management : 8-22 mins   Janice Coffin, COTA/L 02/12/2019, 1:28  PM

## 2019-02-13 LAB — CBC
HCT: 36 % (ref 36.0–46.0)
Hemoglobin: 11.9 g/dL — ABNORMAL LOW (ref 12.0–15.0)
MCH: 29.4 pg (ref 26.0–34.0)
MCHC: 33.1 g/dL (ref 30.0–36.0)
MCV: 88.9 fL (ref 80.0–100.0)
Platelets: 277 10*3/uL (ref 150–400)
RBC: 4.05 MIL/uL (ref 3.87–5.11)
RDW: 14.8 % (ref 11.5–15.5)
WBC: 16 10*3/uL — ABNORMAL HIGH (ref 4.0–10.5)
nRBC: 0 % (ref 0.0–0.2)

## 2019-02-13 NOTE — Plan of Care (Signed)
  Problem: Pain Management: Goal: Pain level will decrease with appropriate interventions 02/13/2019 0317 by Josepha Pigg, RN Outcome: Progressing 02/13/2019 0315 by Josepha Pigg, RN Outcome: Progressing   Problem: Activity: Goal: Range of joint motion will improve 02/13/2019 0317 by Josepha Pigg, RN Outcome: Progressing 02/13/2019 0315 by Josepha Pigg, RN Outcome: Progressing

## 2019-02-13 NOTE — Progress Notes (Addendum)
Pt slept from shift start till 01:30. Pt was easy to arouse, but very drowsy. Appropriate with responses but would fall to sleep mid sentence.  Pt called out around 01:30 for L knee pain, more alert and able to make a phone call to [place].  Pt had 2 episodes of incontinence of urine. Peri care given.  Pt much more alert this morning, able to recall events since 01:30 am, but failed to remember anything prior at the start of the shift.  Bed exit alarm maintained, call light and phone in reach.

## 2019-02-13 NOTE — Plan of Care (Signed)
  Problem: Pain Management: Goal: Pain level will decrease with appropriate interventions Outcome: Progressing   Problem: Activity: Goal: Range of joint motion will improve Outcome: Progressing

## 2019-02-13 NOTE — Progress Notes (Signed)
Physical Therapy Treatment Patient Details Name: Tiffany Lynch MRN: [id number] DOB: [birth year] Today's Date: 02/13/2019    History of Present Illness Pt is a 58 y/o female s/p L TKA. PMH includes tobacco use, drug abuse, COPD, bipolar disorder, and HTN.     PT Comments    Patient seen for mobility progression. Pt is making progress toward PT goals and tolerated gait training distance of 90 ft. Pt overall requires min guard assist for mobility this session. +2 for safety with gait training as pt has been known to sit impulsively. Continue to progress as tolerated.      Follow Up Recommendations  Follow surgeon's recommendation for DC plan and follow-up therapies;Supervision for mobility/OOB     Equipment Recommendations  3in1 (PT)    Recommendations for Other Services       Precautions / Restrictions Precautions Precautions: Knee Precaution Comments: reviewed precautions/positioning with pt; pt with knee flexed in bed upon arrival Restrictions Weight Bearing Restrictions: Yes LLE Weight Bearing: Weight bearing as tolerated    Mobility  Bed Mobility Overal bed mobility: Needs Assistance Bed Mobility: Supine to Sit     Supine to sit: HOB elevated;Min guard     General bed mobility comments: use of rail and HOB elevated; min guard for safety; increased time and effort needed  Transfers Overall transfer level: Needs assistance Equipment used: Rolling walker (2 wheeled) Transfers: Sit to/from Stand Sit to Stand: Min guard         General transfer comment: cues for safe hand placement; min guard for safety  Ambulation/Gait Ambulation/Gait assistance: Min guard;+2 safety/equipment(chair follow) Gait Distance (Feet): 90 Feet Assistive device: Rolling walker (2 wheeled) Gait Pattern/deviations: Step-to pattern;Decreased stance time - left;Decreased step length - right;Decreased step length - left;Decreased weight shift to left;Trunk flexed;Antalgic Gait velocity:  decreased   General Gait Details: cues for L heel strike, increased stride length, and L knee extension during stance phase; min guard for safety; several standing rest breaks; chair follow for safety   Stairs             Wheelchair Mobility    Modified Rankin (Stroke Patients Only)       Balance Overall balance assessment: Needs assistance Sitting-balance support: No upper extremity supported;Feet supported Sitting balance-Leahy Scale: Fair     Standing balance support: Bilateral upper extremity supported;During functional activity Standing balance-Leahy Scale: Poor Standing balance comment: Reliant on BUE support                              Cognition Arousal/Alertness: Awake/alert Behavior During Therapy: WFL for tasks assessed/performed;Agitated Overall Cognitive Status: Within Functional Limits for tasks assessed                                        Exercises      General Comments General comments (skin integrity, edema, etc.): SpO2 90% on RA while ambulating      Pertinent Vitals/Pain Pain Assessment: Faces Faces Pain Scale: Hurts little more Pain Location: L knee Pain Descriptors / Indicators: Guarding;Sore Pain Intervention(s): Limited activity within patient's tolerance;Monitored during session;Repositioned;Patient requesting pain meds-RN notified    Home Living                      Prior Function            PT  Goals (current goals can now be found in the care plan section) Progress towards PT goals: Progressing toward goals    Frequency    7X/week      PT Plan Current plan remains appropriate    Co-evaluation              AM-PAC PT "6 Clicks" Mobility   Outcome Measure  Help needed turning from your back to your side while in a flat bed without using bedrails?: A Little Help needed moving from lying on your back to sitting on the side of a flat bed without using bedrails?: A Little Help  needed moving to and from a bed to a chair (including a wheelchair)?: A Little Help needed standing up from a chair using your arms (e.g., wheelchair or bedside chair)?: A Little Help needed to walk in hospital room?: A Little Help needed climbing 3-5 steps with a railing? : A Little 6 Click Score: 18    End of Session Equipment Utilized During Treatment: Gait belt Activity Tolerance: Patient tolerated treatment well Patient left: with call bell/phone within reach;in chair;with chair alarm set Nurse Communication: Mobility status PT Visit Diagnosis: Difficulty in walking, not elsewhere classified (R26.2);Muscle weakness (generalized) (M62.81);Pain Pain - Right/Left: Left Pain - part of body: Knee     Time: 1017-5102 PT Time Calculation (min) (ACUTE ONLY): 26 min  Charges:  $Gait Training: 23-37 mins                     Earney Navy, PTA Acute Rehabilitation Services Pager: [phone] Office: [phone]     Darliss Cheney 02/13/2019, 11:05 AM

## 2019-02-13 NOTE — Progress Notes (Signed)
   Subjective: 3 Days Post-Op Procedure(s) (LRB): LEFT TOTAL KNEE ARTHROPLASTY-CEMENTED (Left) Patient reports pain as moderate.  No facial changes of pain and vital changes of pain but on chronic narcotics . More alert. In chair. Knee extended first time I have seen her with it almost straight.     Objective: Vital signs in last 24 hours: Temp:  [98.4 F (36.9 C)-98.9 F (37.2 C)] 98.4 F (36.9 C) (06/06 0610) Pulse Rate:  [80-105] 105 (06/06 0610) Resp:  [17] 17 (06/05 1557) BP: (100-140)/(63-83) 140/83 (06/06 0610) SpO2:  [90 %-97 %] 91 % (06/06 0757)  Intake/Output from previous day: 06/05 0701 - 06/06 0700 In: 600 [P.O.:600] Out: 1400 [Urine:1400] Intake/Output this shift: No intake/output data recorded.  Recent Labs    02/11/19 0301 02/12/19 0121 02/13/19 0222  HGB 13.8 12.3 11.9*   Recent Labs    02/12/19 0121 02/13/19 0222  WBC 15.7* 16.0*  RBC 4.28 4.05  HCT 37.6 36.0  PLT 269 277   Recent Labs    02/11/19 0301  NA 136  K 4.6  CL 99  CO2 26  BUN 6  CREATININE 0.64  GLUCOSE 169*  CALCIUM 8.6*   No results for input(s): LABPT, INR in the last 72 hours.  Neurologically intact No results found.  Assessment/Plan: 3 Days Post-Op Procedure(s) (LRB): LEFT TOTAL KNEE ARTHROPLASTY-CEMENTED (Left) Up with therapy, her knee is able to passively extend to full extension after bome foam Zero knee use.  Plan is home Monday if therapy can continue to make progress with her.   Tiffany Lynch 02/13/2019, 10:04 AM

## 2019-02-14 NOTE — Plan of Care (Signed)
  Problem: Activity: Goal: Ability to avoid complications of mobility impairment will improve Outcome: Progressing Goal: Range of joint motion will improve Outcome: Progressing   Problem: Pain Management: Goal: Pain level will decrease with appropriate interventions Outcome: Progressing   Problem: Skin Integrity: Goal: Will show signs of wound healing Outcome: Progressing   

## 2019-02-14 NOTE — Progress Notes (Signed)
Physical Therapy Treatment Patient Details Name: Tiffany Lynch MRN: [id number] DOB: May 04, 1961 Today's Date: 02/14/2019    History of Present Illness Pt is a 58 y/o female s/p L TKA. PMH includes tobacco use, drug abuse, COPD, bipolar disorder, and HTN.     PT Comments    Continuing work on functional mobility and activity tolerance;  Still painful, but making gains in amb distance and activity tolerance; Plan to focus on therex next session   Follow Up Recommendations  Follow surgeon's recommendation for DC plan and follow-up therapies;Supervision for mobility/OOB     Equipment Recommendations  3in1 (PT)    Recommendations for Other Services OT consult     Precautions / Restrictions Precautions Precautions: Knee Precaution Comments: reviewed precautions/positioning with pt; pt with knee flexed in bed upon arrival Restrictions LLE Weight Bearing: Weight bearing as tolerated  Pt educated to not allow any pillow or bolster under knee for healing with optimal range of motion.    Mobility  Bed Mobility Overal bed mobility: Needs Assistance Bed Mobility: Supine to Sit     Supine to sit: Supervision     General bed mobility comments: Used bed rail  Transfers Overall transfer level: Needs assistance Equipment used: Rolling walker (2 wheeled) Transfers: Sit to/from Stand Sit to Stand: Min guard         General transfer comment: cues for safe hand placement; min guard for safety  Ambulation/Gait Ambulation/Gait assistance: Min guard Gait Distance (Feet): 120 Feet(2 seated rest breaks) Assistive device: Rolling walker (2 wheeled) Gait Pattern/deviations: Step-through pattern;Antalgic Gait velocity: decreased   General Gait Details: Cues to activate L quad for stance stability; Cues also to progress to step through pattern for more efficient gait; no gross knee buckling noted   Stairs             Wheelchair Mobility    Modified Rankin (Stroke Patients  Only)       Balance     Sitting balance-Leahy Scale: Fair       Standing balance-Leahy Scale: Poor                              Cognition Arousal/Alertness: Awake/alert Behavior During Therapy: WFL for tasks assessed/performed Overall Cognitive Status: Within Functional Limits for tasks assessed                                        Exercises      General Comments General comments (skin integrity, edema, etc.): O2 sats 95% after walking on room air      Pertinent Vitals/Pain Pain Assessment: 0-10 Pain Score: 7  Pain Location: L knee Pain Descriptors / Indicators: Guarding;Sore Pain Intervention(s): Monitored during session;Premedicated before session    Home Living                      Prior Function            PT Goals (current goals can now be found in the care plan section) Acute Rehab PT Goals Patient Stated Goal: for pain to get better PT Goal Formulation: With patient Time For Goal Achievement: 02/24/19 Potential to Achieve Goals: Good Progress towards PT goals: Progressing toward goals    Frequency    7X/week      PT Plan Current plan remains appropriate    Co-evaluation  AM-PAC PT "6 Clicks" Mobility   Outcome Measure  Help needed turning from your back to your side while in a flat bed without using bedrails?: None Help needed moving from lying on your back to sitting on the side of a flat bed without using bedrails?: None Help needed moving to and from a bed to a chair (including a wheelchair)?: A Little Help needed standing up from a chair using your arms (e.g., wheelchair or bedside chair)?: A Little Help needed to walk in hospital room?: A Little Help needed climbing 3-5 steps with a railing? : A Little 6 Click Score: 20    End of Session Equipment Utilized During Treatment: Gait belt Activity Tolerance: Patient tolerated treatment well Patient left: in chair;with call  bell/phone within reach;with chair alarm set;Other (comment)(in bone foam knee extension) Nurse Communication: Mobility status PT Visit Diagnosis: Difficulty in walking, not elsewhere classified (R26.2);Muscle weakness (generalized) (M62.81);Pain Pain - Right/Left: Left Pain - part of body: Knee     Time: 1962-2297 PT Time Calculation (min) (ACUTE ONLY): 38 min  Charges:  $Gait Training: 23-37 mins $Therapeutic Activity: 8-22 mins                     Roney Marion, PT  Acute Rehabilitation Services Pager [phone] Office [phone]    Tiffany Lynch 02/14/2019, 2:03 PM

## 2019-02-14 NOTE — Progress Notes (Signed)
Physical Therapy Treatment Patient Details Name: Tiffany Lynch MRN: [id number] DOB: [birth year] Today's Date: 02/14/2019    History of Present Illness Pt is a 58 y/o female s/p L TKA. PMH includes tobacco use, drug abuse, COPD, bipolar disorder, and HTN.     PT Comments    Continuing work on functional mobility and activity tolerance;  Notably improved knee flexion ROM, and better knee control with straight leg raise, though still with quad lag; Walked to and from bathroom, where she was able to manage toileting more independently ; she seemed encouraged; on track for dc home tomorrow   Follow Up Recommendations  Follow surgeon's recommendation for DC plan and follow-up therapies;Supervision for mobility/OOB     Equipment Recommendations  3in1 (PT)    Recommendations for Other Services       Precautions / Restrictions Precautions Precautions: Knee Precaution Booklet Issued: Yes (comment) Precaution Comments: reviewed precautions/positioning with pt; pt with knee flexed in bed upon arrival Restrictions LLE Weight Bearing: Weight bearing as tolerated  Pt educated to not allow any pillow or bolster under knee for healing with optimal range of motion.    Mobility  Bed Mobility Overal bed mobility: Needs Assistance Bed Mobility: Supine to Sit     Supine to sit: Supervision     General bed mobility comments: Used bed rail  Transfers Overall transfer level: Needs assistance Equipment used: Rolling walker (2 wheeled) Transfers: Sit to/from Stand Sit to Stand: Min guard         General transfer comment: cues for safe hand placement; min guard for safety  Ambulation/Gait Ambulation/Gait assistance: Min guard Gait Distance (Feet): 30 Feet Assistive device: Rolling walker (2 wheeled) Gait Pattern/deviations: Step-through pattern;Antalgic Gait velocity: decreased   General Gait Details: Cues to activate L quad for stance stability; Cues also to progress to step through  pattern for more efficient gait; no gross knee buckling noted   Stairs             Wheelchair Mobility    Modified Rankin (Stroke Patients Only)       Balance                                            Cognition Arousal/Alertness: Awake/alert(though sleepy) Behavior During Therapy: WFL for tasks assessed/performed Overall Cognitive Status: Within Functional Limits for tasks assessed                                        Exercises Total Joint Exercises Quad Sets: AROM;Left;10 reps Heel Slides: Left;10 reps;AAROM;Supine Hip ABduction/ADduction: AAROM;Left;10 reps;Supine Straight Leg Raises: AAROM;10 reps;Supine;Left Goniometric ROM: approx 5-90 degrees    General Comments        Pertinent Vitals/Pain Pain Assessment: 0-10 Pain Score: 7  Pain Location: L knee Pain Descriptors / Indicators: Guarding;Sore Pain Intervention(s): Monitored during session    Home Living                      Prior Function            PT Goals (current goals can now be found in the care plan section) Acute Rehab PT Goals Patient Stated Goal: for pain to get better PT Goal Formulation: With patient Time For Goal Achievement: 02/24/19 Potential to Achieve Goals:  Good Progress towards PT goals: Progressing toward goals    Frequency    7X/week      PT Plan Current plan remains appropriate    Co-evaluation              AM-PAC PT "6 Clicks" Mobility   Outcome Measure  Help needed turning from your back to your side while in a flat bed without using bedrails?: None Help needed moving from lying on your back to sitting on the side of a flat bed without using bedrails?: None Help needed moving to and from a bed to a chair (including a wheelchair)?: A Little Help needed standing up from a chair using your arms (e.g., wheelchair or bedside chair)?: A Little Help needed to walk in hospital room?: A Little Help needed climbing  3-5 steps with a railing? : A Little 6 Click Score: 20    End of Session Equipment Utilized During Treatment: Gait belt Activity Tolerance: Patient tolerated treatment well Patient left: in bed;with call bell/phone within reach;with bed alarm set(in bone foam) Nurse Communication: Mobility status PT Visit Diagnosis: Difficulty in walking, not elsewhere classified (R26.2);Muscle weakness (generalized) (M62.81);Pain Pain - Right/Left: Left Pain - part of body: Knee     Time: 1510(time in is approx)-1538 PT Time Calculation (min) (ACUTE ONLY): 28 min  Charges:  $Gait Training: 8-22 mins $Therapeutic Exercise: 8-22 mins                     Roney Marion, PT  Acute Rehabilitation Services Pager [phone] Office [place] 02/14/2019, 5:40 PM

## 2019-02-14 NOTE — Progress Notes (Signed)
   Subjective: 4 Days Post-Op Procedure(s) (LRB): LEFT TOTAL KNEE ARTHROPLASTY-CEMENTED (Left) Patient reports pain as mild and moderate.    Objective: Vital signs in last 24 hours: Temp:  [98.2 F (36.8 C)-98.3 F (36.8 C)] 98.3 F (36.8 C) (06/07 0539) Pulse Rate:  [85-95] 86 (06/07 1035) Resp:  [14-16] 14 (06/07 0539) BP: (102-124)/(52-77) 102/67 (06/07 1035) SpO2:  [81 %-94 %] 94 % (06/07 0920)  Intake/Output from previous day: 06/06 0701 - 06/07 0700 In: 840 [P.O.:840] Out: 600 [Urine:600] Intake/Output this shift: Total I/O In: 240 [P.O.:240] Out: -   Recent Labs    02/12/19 0121 02/13/19 0222  HGB 12.3 11.9*   Recent Labs    02/12/19 0121 02/13/19 0222  WBC 15.7* 16.0*  RBC 4.28 4.05  HCT 37.6 36.0  PLT 269 277   No results for input(s): NA, K, CL, CO2, BUN, CREATININE, GLUCOSE, CALCIUM in the last 72 hours. No results for input(s): LABPT, INR in the last 72 hours.  Neurologically intact No results found.  Assessment/Plan: 4 Days Post-Op Procedure(s) (LRB): LEFT TOTAL KNEE ARTHROPLASTY-CEMENTED (Left) Up with therapy, plan home Monday with HHPT.   Marybelle Killings 02/14/2019, 1:29 PM

## 2019-02-14 NOTE — Plan of Care (Signed)
  Problem: Activity: Goal: Ability to avoid complications of mobility impairment will improve Outcome: Progressing   Problem: Pain Management: Goal: Pain level will decrease with appropriate interventions Outcome: Progressing   Problem: Skin Integrity: Goal: Will show signs of wound healing Outcome: Progressing   

## 2019-02-15 MED ORDER — ASPIRIN 325 MG PO TBEC: 325.0000 mg | DELAYED_RELEASE_TABLET | Freq: Every day | ORAL | 0 refills | Status: DC

## 2019-02-15 MED ORDER — OXYCODONE-ACETAMINOPHEN 5-325 MG PO TABS: 1.0000 | ORAL_TABLET | Freq: Four times a day (QID) | ORAL | 0 refills | Status: DC | PRN

## 2019-02-15 MED ORDER — CLONAZEPAM 1 MG PO TABS: 1.0000 mg | ORAL_TABLET | Freq: Two times a day (BID) | ORAL | 0 refills | Status: AC

## 2019-02-15 NOTE — Progress Notes (Signed)
PT Cancellation Note  Patient Details Name: Tiffany Lynch MRN: [id number] DOB: 1961/08/27   Cancelled Treatment:    Reason Eval/Treat Not Completed: Pain limiting ability to participate(Pt reports, " I did not recieve my pain pills till late and i can only do a little bit."  Pt refusing to participate in gait progression of progression to stair training.  Based on refusal informed nursing to proceed with d/c home. )  After educating patient that her MD wanted her to d/c after therapy she continues to refuse stating, " I am going home today."   Cristela Blue 02/15/2019, 10:18 AM  Governor Rooks, PTA Acute Rehabilitation Services Pager [phone] Office [phone]

## 2019-02-15 NOTE — Progress Notes (Signed)
Discharge today after Therapy. Office one week . HHPT. Made slow progress but now walking in the hall. Percocet will be sent in to her pharmacy. With klonopin , wellbutrin ,buspar, and Belbuca will not add muscle relaxant.

## 2019-02-15 NOTE — Progress Notes (Signed)
Pt is ambulating to the bathroom with walker, steady gait. Left knee dressing dry and intact. Discharge instructions given to pt. Discharged to home picked up by family.

## 2019-02-15 NOTE — TOC Transition Note (Signed)
Transition of Care [facility]) - CM/SW Discharge Note   Patient Details  Name: JENEL GIERKE MRN: [id number] Date of Birth: 1961-05-12  [facility]) CM/SW Contact:  Ninfa Meeker, RN Phone Number: 02/15/2019, 11:55 AM   Clinical Narrative:   58 yr old female s/p left total knee arthroplasty. Case manager spoke with patient via telephone to discuss discharge needs. Choice for Home Health agency was offered, referral called to [facility]. Patient says she has family that will assist her when discharged, she already has DME.       Barriers to Discharge: No Barriers Identified   Patient Goals and CMS Choice Patient states their goals for this hospitalization and ongoing recovery are:: to go home CMS Medicare.gov Compare Post Acute Care list provided to:: Patient Choice offered to / list presented to : Patient  Discharge Placement                       Discharge Plan and Services   Discharge Planning Services: CM Consult Post Acute Care Choice: Home Health          DME Arranged: N/A         HH Arranged: PT [place] Agency: [place] ([place]) Date [place]: 02/15/19 Time [place]: [place] Representative spoke with at [place]: [place] ([place]) Interventions     Readmission Risk Interventions No flowsheet data found.

## 2019-02-15 NOTE — Discharge Instructions (Signed)

## 2019-02-18 ENCOUNTER — Telehealth: Payer: Self-pay | Admitting: Orthopaedic Surgery

## 2019-02-18 NOTE — Telephone Encounter (Signed)
I spoke with Marzetta Board and advised.

## 2019-02-18 NOTE — Telephone Encounter (Signed)
OK - thanks

## 2019-02-18 NOTE — Telephone Encounter (Signed)
Ok for orders? 

## 2019-02-18 NOTE — Discharge Summary (Signed)
Patient ID: Tiffany Lynch MRN: [id number] DOB/AGE: 03/16/1961 58 y.o.  Admit date: 02/10/2019 Discharge date: 02/18/2019  Admission Diagnoses:  Active Problems:   Unilateral primary osteoarthritis, left knee   Arthritis of left knee   S/P total knee arthroplasty, left   Discharge Diagnoses:  Active Problems:   Unilateral primary osteoarthritis, left knee   Arthritis of left knee   S/P total knee arthroplasty, left  status post Procedure(s): LEFT TOTAL KNEE ARTHROPLASTY-CEMENTED  Past Medical History:  Diagnosis Date  . Anxiety   . Arthritis   . Bipolar 1 disorder ([place])   . Chronic back pain   . Chronic knee pain   . COPD (chronic obstructive pulmonary disease) ([place])   . Depression   . Dyspnea   . GERD (gastroesophageal reflux disease)   . History of anemia   . Hypertension   . Lumbar radiculopathy   . On home oxygen therapy "6 years ago"   3LPM PRN: "had this 6 years ago," pt no longer has O2 at home (07/01/2017)  . Sleep apnea     Surgeries: Procedure(s): LEFT TOTAL KNEE ARTHROPLASTY-CEMENTED on 02/10/2019   Consultants:   Discharged Condition: Improved  Hospital Course: Tiffany Lynch is an 58 y.o. female who was admitted 02/10/2019 for operative treatment of left knee arthritis. Patient failed conservative treatments (please see the history and physical for the specifics) and had severe unremitting pain that affects sleep, daily activities and work/hobbies. After pre-op clearance, the patient was taken to the operating room on 02/10/2019 and underwent  Procedure(s): LEFT TOTAL KNEE ARTHROPLASTY-CEMENTED.    Patient was given perioperative antibiotics:  Anti-infectives (From admission, onward)   Start     Dose/Rate Route Frequency Ordered Stop   02/10/19 1700  ceFAZolin (ANCEF) IVPB 1 g/50 mL premix     1 g 100 mL/hr over 30 Minutes Intravenous Every 8 hours 02/10/19 1646 02/10/19 2231   02/10/19 1015  ceFAZolin (ANCEF) IVPB 2g/100 mL premix     2 g 200 mL/hr  over 30 Minutes Intravenous On call to O.R. 02/10/19 1004 02/10/19 1323   02/10/19 1015  ceFAZolin (ANCEF) 2-4 GM/100ML-% IVPB    Note to Pharmacy: Jasmine Pang   : cabinet override      02/10/19 1015 02/10/19 1253       Patient was given sequential compression devices and early ambulation to prevent DVT.   Patient benefited maximally from hospital stay and there were no complications. At the time of discharge, the patient was urinating/moving their bowels without difficulty, tolerating a regular diet, pain is controlled with oral pain medications and they have been cleared by PT/OT.   Recent vital signs: No data found.   Recent laboratory studies: No results for input(s): WBC, HGB, HCT, PLT, NA, K, CL, CO2, BUN, CREATININE, GLUCOSE, INR, CALCIUM in the last 72 hours.  Invalid input(s): PT, 2   Discharge Medications:   Allergies as of 02/15/2019   No Known Allergies     Medication List    STOP taking these medications   acetaminophen 650 MG CR tablet Commonly known as: TYLENOL   HYDROcodone-acetaminophen 5-325 MG tablet Commonly known as: NORCO/VICODIN   naproxen 500 MG tablet Commonly known as: NAPROSYN     TAKE these medications   albuterol (2.5 MG/3ML) 0.083% nebulizer solution Commonly known as: PROVENTIL Take 2.5 mg by nebulization 2 (two) times a day.   amLODipine 10 MG tablet Commonly known as: NORVASC Take 10 mg by mouth daily.   aspirin  325 MG EC tablet Take 1 tablet (325 mg total) by mouth daily with breakfast.   Belbuca 300 MCG Film Generic drug: Buprenorphine HCl Place 300 mcg under the tongue 2 (two) times a day.   buPROPion 300 MG 24 hr tablet Commonly known as: WELLBUTRIN XL Take 300 mg by mouth every morning.   busPIRone 15 MG tablet Commonly known as: BUSPAR Take 15 mg by mouth 3 (three) times daily.   clonazePAM 1 MG tablet Commonly known as: KLONOPIN Take 1 tablet (1 mg total) by mouth 2 (two) times daily. What changed: when to take  this   doxepin 75 MG capsule Commonly known as: SINEQUAN Take 75 mg by mouth at bedtime.   DULoxetine 60 MG capsule Commonly known as: CYMBALTA Take 60 mg by mouth 2 (two) times daily.   metoprolol tartrate 25 MG tablet Commonly known as: LOPRESSOR Take 25 mg by mouth 2 (two) times daily.   multivitamin with minerals Tabs tablet Take 1 tablet by mouth daily.   nitrofurantoin 100 MG capsule Commonly known as: MACRODANTIN Take 100 mg by mouth 2 (two) times a day.   omeprazole 20 MG capsule Commonly known as: PRILOSEC Take 20 mg by mouth daily.   oxybutynin 5 MG 24 hr tablet Commonly known as: DITROPAN-XL Take 5 mg by mouth daily.   oxyCODONE-acetaminophen 5-325 MG tablet Commonly known as: Percocet Take 1-2 tablets by mouth every 6 (six) hours as needed for severe pain.   Pazeo 0.7 % Soln Generic drug: Olopatadine HCl Place 1 drop into both eyes daily.   risperiDONE 1 MG tablet Commonly known as: RISPERDAL Take 1 mg by mouth at bedtime.   Trelegy Ellipta 100-62.5-25 MCG/INH Aepb Generic drug: Fluticasone-Umeclidin-Vilant Inhale 1 puff into the lungs daily.   Voltaren 1 % Gel Generic drug: diclofenac sodium Apply 1 g topically 4 (four) times daily as needed for pain.       Diagnostic Studies: Dg Knee 1-2 Views Left  Result Date: 02/10/2019 CLINICAL DATA:  Postop check EXAM: LEFT KNEE - 1-2 VIEW COMPARISON:  03/22/2018 FINDINGS: Left hip replacement is noted. No acute fracture or dislocation is seen. No soft tissue abnormality is noted. IMPRESSION: Status post left hip replacement. Electronically Signed   By: Inez Catalina M.D.   On: 02/10/2019 15:29      Follow-up Information    Marybelle Killings, MD Follow up in 1 week(s).   Specialty: Orthopedic Surgery Contact information: [place] [address] [place], [place] Follow up.   Why: A representative from [facility] will contact you to  arrange start date and time for your therapy. Contact information: [place] [address] [phone]           Discharge Plan:  discharge to home  Disposition:     Signed: Benjiman Core for Rodell Perna MD 02/18/2019, 10:17 AM

## 2019-02-18 NOTE — Telephone Encounter (Signed)
Received voicemail message from [employer] (PT) with [place] needing verbal orders for HHPT 3 Wk 1, 2 Wk 1 and  1 Wk 1   The number to contact Marzetta Board is [phone]   She advised ok to leave message

## 2019-02-25 ENCOUNTER — Ambulatory Visit (INDEPENDENT_AMBULATORY_CARE_PROVIDER_SITE_OTHER): Payer: Medicaid Other | Admitting: Orthopaedic Surgery

## 2019-02-25 ENCOUNTER — Ambulatory Visit: Payer: Self-pay

## 2019-02-25 ENCOUNTER — Other Ambulatory Visit: Payer: Self-pay

## 2019-02-25 ENCOUNTER — Encounter: Payer: Self-pay | Admitting: Orthopaedic Surgery

## 2019-02-25 VITALS — Ht 63.0 in | Wt 223.0 lb

## 2019-02-25 DIAGNOSIS — Z96652 Presence of left artificial knee joint: Secondary | ICD-10-CM

## 2019-02-25 MED ORDER — OXYCODONE-ACETAMINOPHEN 5-325 MG PO TABS: 1.0000 | ORAL_TABLET | ORAL | 0 refills | Status: DC | PRN

## 2019-02-25 NOTE — Progress Notes (Signed)
Post-Op Visit Note   Patient: Tiffany Lynch           Date of Birth: Jul 12, 1961           MRN: [id number] Visit Date: 02/25/2019 PCP: Adaline Sill, NP   Assessment & Plan:  Chief Complaint:  Chief Complaint  Patient presents with  . Left Knee - Routine Post Op    02/10/2019 Left TKA Cemented   Visit Diagnoses:  1. S/P total knee arthroplasty, left     Plan:  patient used all her Percocet new prescription 30 tablets prescribed.  Recheck 4 weeks.  Outpatient physical therapy in [place] close to her home.  Follow-Up Instructions: No follow-ups on file.   Orders:  Orders Placed This Encounter  Procedures  . XR KNEE 3 VIEW LEFT   No orders of the defined types were placed in this encounter.   Imaging: No results found.  PMFS History: Patient Active Problem List   Diagnosis Date Noted  . S/P total knee arthroplasty, left 02/11/2019  . Unilateral primary osteoarthritis, left knee 02/10/2019  . Arthritis of left knee 02/10/2019  . Rib fractures 07/01/2017  . Tobacco dependence 07/01/2017  . Derangement of posterior horn of medial meniscus of left knee   . Loose body of left knee   . Hypotension 08/14/2016  . Respiratory failure with hypoxia ([place]) 08/14/2016  . Hyperglycemia 08/14/2016  . Alcohol abuse 08/14/2016  . Cocaine abuse ([place]) 08/14/2016  . Chronic pain 08/14/2016  . Oral thrush 08/14/2016  . Hypokalemia 05/31/2016  . Pre-syncope 05/31/2016  . Current smoker 07/20/2015  . Urge and stress incontinence 07/14/2015  . Hyperlipidemia 01/02/2015  . Insomnia 01/02/2015  . OAB (overactive bladder) 01/02/2015  . Depression 01/02/2015  . GERD (gastroesophageal reflux disease) 01/02/2015  . ARF (acute renal failure) ([place]) 12/21/2013  . COPD (chronic obstructive pulmonary disease) ([place]) 05/22/2012  . Dysfunctional uterine bleeding 04/01/2012  . Orthostatic hypotension 03/31/2012  . Encephalopathy acute 03/30/2012  . Chronic back pain   .  Bipolar 1 disorder ([place])   . Anxiety    Past Medical History:  Diagnosis Date  . Anxiety   . Arthritis   . Bipolar 1 disorder ([place])   . Chronic back pain   . Chronic knee pain   . COPD (chronic obstructive pulmonary disease) ([place])   . Depression   . Dyspnea   . GERD (gastroesophageal reflux disease)   . History of anemia   . Hypertension   . Lumbar radiculopathy   . On home oxygen therapy "6 years ago"   3LPM PRN: "had this 6 years ago," pt no longer has O2 at home (07/01/2017)  . Sleep apnea     Family History  Problem Relation Age of Onset  . Hypertension Mother   . Hypertension Brother   . Anxiety disorder Brother   . Depression Brother     Past Surgical History:  Procedure Laterality Date  . COLONOSCOPY WITH PROPOFOL N/A 07/18/2014   Procedure: COLONOSCOPY WITH PROPOFOL;  Surgeon: Rogene Houston, MD;  Location: [facility];  Service: Endoscopy;  Laterality: N/A;  in cecum at 0753, withdrawal time 20 min  . DILATION AND CURETTAGE OF UTERUS  age 9  . KNEE ARTHROSCOPY WITH MEDIAL MENISECTOMY Left 10/10/2016   Procedure: KNEE ARTHROSCOPY WITH MEDIAL MENISECTOMY REMOVAL LOSE BODY;  Surgeon: Carole Civil, MD;  Location: [facility];  Service: Orthopedics;  Laterality: Left;  . POLYPECTOMY  05/22/2012   Procedure: POLYPECTOMY;  Surgeon: Jonnie Kind, MD;  Location: [facility];  Service: Gynecology;  Laterality: N/A;  Endometrial Polypectomy  . POLYPECTOMY N/A 07/18/2014   Procedure: POLYPECTOMY;  Surgeon: Rogene Houston, MD;  Location: [facility];  Service: Endoscopy;  Laterality: N/A;  . TOTAL KNEE ARTHROPLASTY Left 02/10/2019   Procedure: LEFT TOTAL KNEE ARTHROPLASTY-CEMENTED;  Surgeon: Marybelle Killings, MD;  Location: [facility];  Service: Orthopedics;  Laterality: Left;  . TUBAL LIGATION     Social History   Occupational History  . Occupation: disabled  Tobacco Use  . Smoking status: Current Every Day Smoker    Packs/day: 0.50    Years: 41.00    Pack years: 20.50    Types: Cigarettes   . Smokeless tobacco: Never Used  Substance and Sexual Activity  . Alcohol use: Not Currently    Alcohol/week: 2.0 - 3.0 standard drinks    Types: 2 - 3 Glasses of wine per week    Comment: drinks wine several times a week  . Drug use: Yes    Frequency: 3.0 times per week    Types: Marijuana    Comment: history of cocaine and marijuana use, reports no use in about 6 months  . Sexual activity: Yes    Birth control/protection: Surgical    Comment: tubes tied

## 2019-02-25 NOTE — Addendum Note (Signed)
Addended by: Meyer Cory on: 02/25/2019 03:25 PM   Modules accepted: Orders

## 2019-02-26 ENCOUNTER — Inpatient Hospital Stay: Payer: Medicaid Other | Admitting: Orthopaedic Surgery

## 2019-03-05 ENCOUNTER — Telehealth: Payer: Self-pay | Admitting: Orthopaedic Surgery

## 2019-03-05 ENCOUNTER — Other Ambulatory Visit: Payer: Self-pay

## 2019-03-05 ENCOUNTER — Ambulatory Visit: Payer: Medicaid Other | Attending: Orthopaedic Surgery | Admitting: Physical Therapy

## 2019-03-05 ENCOUNTER — Encounter: Payer: Self-pay | Admitting: Physical Therapy

## 2019-03-05 DIAGNOSIS — M25662 Stiffness of left knee, not elsewhere classified: Secondary | ICD-10-CM | POA: Diagnosis present

## 2019-03-05 DIAGNOSIS — R262 Difficulty in walking, not elsewhere classified: Secondary | ICD-10-CM | POA: Diagnosis present

## 2019-03-05 DIAGNOSIS — M25562 Pain in left knee: Secondary | ICD-10-CM

## 2019-03-05 NOTE — Telephone Encounter (Signed)
Pt called to request a refill of Percocet 500. States she has been in so much pain.  Please call patient to advise.  [phone]

## 2019-03-05 NOTE — Therapy (Signed)
[facility] [place], [place], [address] Phone: [phone]   Fax:  [phone]  Physical Therapy Evaluation  Patient Details  Name: Tiffany Lynch MRN: [id number] Date of Birth: 12-16-1960 Referring Provider (PT): Rodell Perna, MD   Encounter Date: 03/05/2019  PT End of Session - 03/05/19 1015    Visit Number  1    Number of Visits  18    Date for PT Re-Evaluation  04/30/19    Authorization Type  Medicaid    PT Start Time  0915    PT Stop Time  0950    PT Time Calculation (min)  35 min    Activity Tolerance  Patient limited by pain    Behavior During Therapy  [facility] for tasks assessed/performed       Past Medical History:  Diagnosis Date  . Anxiety   . Arthritis   . Bipolar 1 disorder ([place])   . Chronic back pain   . Chronic knee pain   . COPD (chronic obstructive pulmonary disease) ([place])   . Depression   . Dyspnea   . GERD (gastroesophageal reflux disease)   . History of anemia   . Hypertension   . Lumbar radiculopathy   . On home oxygen therapy "6 years ago"   3LPM PRN: "had this 6 years ago," pt no longer has O2 at home (07/01/2017)  . Sleep apnea     Past Surgical History:  Procedure Laterality Date  . COLONOSCOPY WITH PROPOFOL N/A 07/18/2014   Procedure: COLONOSCOPY WITH PROPOFOL;  Surgeon: Rogene Houston, MD;  Location: [facility];  Service: Endoscopy;  Laterality: N/A;  in cecum at 0753, withdrawal time 20 min  . DILATION AND CURETTAGE OF UTERUS  age 45  . KNEE ARTHROSCOPY WITH MEDIAL MENISECTOMY Left 10/10/2016   Procedure: KNEE ARTHROSCOPY WITH MEDIAL MENISECTOMY REMOVAL LOSE BODY;  Surgeon: Carole Civil, MD;  Location: [facility];  Service: Orthopedics;  Laterality: Left;  . POLYPECTOMY  05/22/2012   Procedure: POLYPECTOMY;  Surgeon: Jonnie Kind, MD;  Location: [facility];  Service: Gynecology;  Laterality: N/A;  Endometrial Polypectomy  . POLYPECTOMY N/A 07/18/2014   Procedure: POLYPECTOMY;  Surgeon:  Rogene Houston, MD;  Location: [facility];  Service: Endoscopy;  Laterality: N/A;  . TOTAL KNEE ARTHROPLASTY Left 02/10/2019   Procedure: LEFT TOTAL KNEE ARTHROPLASTY-CEMENTED;  Surgeon: Marybelle Killings, MD;  Location: [facility];  Service: Orthopedics;  Laterality: Left;  . TUBAL LIGATION      There were no vitals filed for this visit.   Subjective Assessment - 03/05/19 1055    Subjective  COVID-19 screening performed prior to patient entering the building.Patient arrives to physical therapy with reports of left knee pain, stiffness, and difficulty walking due to a left total knee replacement on 02/10/2019. She reports having 2 weeks of home health therapy and has been compliant with some but not all exercises on HEP. Patient reports requiring assistance from roommate and brother for 80% of her daily activities. She reports has a tub bench and only showers when someone is at home with her. Patient has difficulties with walking around her home with her walker. Patient has not been able to access her bedroom due to pain. Patient's pain at worst is 8/10 with activity and pain at best is 5/10 with pain medication. Patient's goals are to decrease pain, improve movement, and walk without her walker.    Pertinent History  left TKA 02/10/2019, HTN, COPD, Bipolar  Limitations  Sitting;Standing;Walking;House hold activities    How long can you sit comfortably?  20 mins    How long can you stand comfortably?  less than 5 minutes    How long can you walk comfortably?  room to room    Patient Stated Goals  "get back on feet and walk normal"    Currently in Pain?  Yes    Pain Score  6     Pain Location  Knee    Pain Orientation  Left    Pain Descriptors / Indicators  Aching;Sore    Pain Type  Surgical pain    Pain Onset  1 to 4 weeks ago    Pain Frequency  Constant    Aggravating Factors   movement and activitiy    Pain Relieving Factors  pain medication    Effect of Pain on Daily Activities  needs assistance for  80% of ADLs         [facility] PT Assessment - 03/05/19 0001      Assessment   Medical Diagnosis  S/P total knee arthroplasty, left    Referring Provider (PT)  Rodell Perna, MD    Onset Date/Surgical Date  02/10/19    Next MD Visit  July 2020    Prior Therapy  home health PT      Precautions   Precautions  None      Restrictions   Weight Bearing Restrictions  No      Balance Screen   Has the patient fallen in the past 6 months  Yes    How many times?  6 total; 2 since surgery    Has the patient had a decrease in activity level because of a fear of falling?   No    Is the patient reluctant to leave their home because of a fear of falling?   Yes      Home Environment   Living Environment  Private residence    Living Arrangements  Other relatives;Non-relatives/Friends    Home Access  Level entry    Home Layout  Two level;Able to live on main level with bedroom/bathroom    [place] - 2 wheels;Shower seat      Prior Function   Level of Independence  Independent with household mobility with device;Needs assistance with ADLs;Needs assistance with homemaking      Observation/Other Assessments   Skin Integrity  increased redness throughout scar, moderate scabbing      ROM / Strength   AROM / PROM / Strength  AROM;PROM      AROM   Overall AROM   Deficits    AROM Assessment Site  Knee    Right/Left Knee  Left    Left Knee Extension  14    Left Knee Flexion  104      PROM   Overall PROM   Deficits    PROM Assessment Site  Knee    Right/Left Knee  Left    Left Knee Extension  13    Left Knee Flexion  114      Palpation   Patella mobility  decreased increased mobility in all planes    Palpation comment  tenderness to palpation throughout left knee      Transfers   Transfers  Supine to Sit    Supine to Sit  4: Min assist      Ambulation/Gait   Assistive device  Rolling walker    Gait Pattern  Step-through pattern;Decreased step  length - left;Decreased stance  time - right;Decreased stride length;Antalgic;Trunk flexed;Left flexed knee in stance                Objective measurements completed on examination: See above findings.              PT Education - 03/05/19 1107    Education Details  heel prop for knee extension, hamstring stretch, hamstring set, seated knee flexion stretch    Person(s) Educated  Patient    Methods  Explanation;Demonstration;Handout    Comprehension  Verbalized understanding;Returned demonstration       PT Short Term Goals - 03/05/19 1016      PT SHORT TERM GOAL #1   Title  Patient will be independent with HEP    Baseline  no knowledge of exercises    Time  3    Period  Weeks    Status  New      PT SHORT TERM GOAL #2   Title  Patient will demonstrate independence with bed mobility.    Baseline  Min A for sidelying to sit.    Time  3    Period  Weeks    Status  New        PT Long Term Goals - 03/05/19 1018      PT LONG TERM GOAL #1   Title  Patient will be independent with advanced HEP    Baseline  no knowledge of exercises    Time  6    Period  Weeks    Status  New      PT LONG TERM GOAL #2   Title  Patient will demonstrate 115+ degrees or left knee flexion AROM to improve ability to perform functional tasks    Baseline  104 degrees of left flexion AROM    Time  6    Period  Weeks    Status  New      PT LONG TERM GOAL #3   Title  Patient will demonstrate 5 degrees or less of left knee extension AROM to improve gait mechanics.    Baseline  14 degrees from neutral    Time  6    Period  Weeks    Status  New      PT LONG TERM GOAL #4   Title  Patient will report ability to perform ADLs with pain less than or equal to 4/10.    Baseline  pain 8/10    Time  6    Period  Weeks    Status  New      PT LONG TERM GOAL #5   Title  Patient will report ability to negotiate 5 steps with both railings and step to step pattern to safely access her bedroom.    Baseline  unable to  access bedroom per patient report    Time  6    Period  Weeks    Status  New             Plan - 03/05/19 1108    Clinical Impression Statement  Patient is a 58 year old female who presents to physical therapy with left knee pain, decreased left ROM, and increased edema. Patient able to perform sit to supine with no assistance but required min A to transition from sidelying to sit. Patient ambulates with and antalgic gait pattern, decreased left stance time, decreased knee flexion during swing, and increased knee flexion during stance. Patient educated on importance of performing HEP to  maximize PT benefit. Patient reported understanding. Patient would benefit from skilled physical therapy to address deficits and address patient's goals.    Stability/Clinical Decision Making  Stable/Uncomplicated    Clinical Decision Making  Low    Rehab Potential  Good    PT Frequency  3x / week    PT Duration  6 weeks    PT Treatment/Interventions  ADLs/Self Care Home Management;Stair training;Gait training;Neuromuscular re-education;Therapeutic activities;Therapeutic exercise;Moist Heat;Electrical Stimulation;Cryotherapy;Balance training;Patient/family education;Manual techniques;Scar mobilization;Passive range of motion;Vasopneumatic Device;Taping    PT Next Visit Plan  Nustep, knee AROM, knee PROM modalities PRN for pain relief.    PT Home Exercise Plan  see patient  education section    Consulted and Agree with Plan of Care  Patient       Patient will benefit from skilled therapeutic intervention in order to improve the following deficits and impairments:  Pain, Decreased scar mobility, Decreased balance, Decreased activity tolerance, Decreased strength, Increased edema, Difficulty walking, Decreased range of motion  Visit Diagnosis: 1. Acute pain of left knee   2. Stiffness of left knee, not elsewhere classified   3. Difficulty in walking, not elsewhere classified        Problem  List Patient Active Problem List   Diagnosis Date Noted  . S/P total knee arthroplasty, left 02/11/2019  . Unilateral primary osteoarthritis, left knee 02/10/2019  . Arthritis of left knee 02/10/2019  . Rib fractures 07/01/2017  . Tobacco dependence 07/01/2017  . Derangement of posterior horn of medial meniscus of left knee   . Loose body of left knee   . Hypotension 08/14/2016  . Respiratory failure with hypoxia ([place]) 08/14/2016  . Hyperglycemia 08/14/2016  . Alcohol abuse 08/14/2016  . Cocaine abuse ([place]) 08/14/2016  . Chronic pain 08/14/2016  . Oral thrush 08/14/2016  . Hypokalemia 05/31/2016  . Pre-syncope 05/31/2016  . Current smoker 07/20/2015  . Urge and stress incontinence 07/14/2015  . Hyperlipidemia 01/02/2015  . Insomnia 01/02/2015  . OAB (overactive bladder) 01/02/2015  . Depression 01/02/2015  . GERD (gastroesophageal reflux disease) 01/02/2015  . ARF (acute renal failure) ([place]) 12/21/2013  . COPD (chronic obstructive pulmonary disease) ([place]) 05/22/2012  . Dysfunctional uterine bleeding 04/01/2012  . Orthostatic hypotension 03/31/2012  . Encephalopathy acute 03/30/2012  . Chronic back pain   . Bipolar 1 disorder ([place])   . Anxiety     Gabriela Eves, PT, DPT 03/05/2019, 12:42 PM  [facility] [address] [place], [place], [address] Phone: [phone]   Fax:  [phone]  Name: Tiffany Lynch MRN: [id number] Date of Birth: [birth year]

## 2019-03-05 NOTE — Telephone Encounter (Signed)
Please advise 

## 2019-03-06 ENCOUNTER — Other Ambulatory Visit: Payer: Self-pay | Admitting: Orthopaedic Surgery

## 2019-03-06 MED ORDER — HYDROCODONE-ACETAMINOPHEN 5-325 MG PO TABS: 1.0000 | ORAL_TABLET | Freq: Three times a day (TID) | ORAL | 0 refills | Status: DC | PRN

## 2019-03-06 NOTE — Telephone Encounter (Signed)
I called discussed. . norco 5/325 # 30 one po q 8 hrs prn pain. FYI

## 2019-03-08 NOTE — Telephone Encounter (Signed)
noted 

## 2019-03-09 ENCOUNTER — Telehealth: Payer: Self-pay | Admitting: Radiology

## 2019-03-09 NOTE — Telephone Encounter (Signed)
Received prior auth for hydrocodone.  Entered into [employer]. Approval # [id number]

## 2019-03-25 ENCOUNTER — Encounter: Payer: Self-pay | Admitting: Orthopaedic Surgery

## 2019-03-25 ENCOUNTER — Other Ambulatory Visit: Payer: Self-pay

## 2019-03-25 ENCOUNTER — Ambulatory Visit (INDEPENDENT_AMBULATORY_CARE_PROVIDER_SITE_OTHER): Payer: Medicaid Other | Admitting: Orthopaedic Surgery

## 2019-03-25 VITALS — BP 109/69 | HR 72 | Ht 63.0 in | Wt 215.0 lb

## 2019-03-25 DIAGNOSIS — Z96652 Presence of left artificial knee joint: Secondary | ICD-10-CM

## 2019-03-25 DIAGNOSIS — M7542 Impingement syndrome of left shoulder: Secondary | ICD-10-CM

## 2019-03-25 MED ORDER — TRAMADOL HCL 50 MG PO TABS: 50.0000 mg | ORAL_TABLET | Freq: Two times a day (BID) | ORAL | 0 refills | Status: DC | PRN

## 2019-03-25 NOTE — Progress Notes (Signed)
Post-Op Visit Note   Patient: Tiffany Lynch           Date of Birth: 06-Jun-1961           MRN: [id number] Visit Date: 03/25/2019 PCP: Adaline Sill, NP   Assessment & Plan: Patient returns she had problems with insurance and finally has gotten outpatient therapy approved she does have full extension and can flex 100 degrees on her own she still has some quad weakness but can do a straight leg raise.  She walks in the house some without a walker hang onto the walls and counter.  She can use a walker when she goes out.  She will continue with outpatient therapy I will recheck her in 1 month.  She has had problems with left shoulder impingement and subacromial injection performed today which she tolerated well.  Chief Complaint:  Chief Complaint  Patient presents with  . Left Knee - Routine Post Op    June 3rd left total knee replacement    Visit Diagnoses:  1. S/P total knee arthroplasty, left 02/10/2019   2. Impingement syndrome of left shoulder     Plan: Left subacromial injection performed.  Outpatient therapy ordered.  Recheck 1 month.  Follow-Up Instructions: No follow-ups on file.   Orders:  No orders of the defined types were placed in this encounter.  Meds ordered this encounter  Medications  . traMADol (ULTRAM) 50 MG tablet    Sig: Take 1 tablet (50 mg total) by mouth every 12 (twelve) hours as needed.    Dispense:  30 tablet    Refill:  0    Imaging: No results found.  PMFS History: Patient Active Problem List   Diagnosis Date Noted  . Impingement syndrome of left shoulder 03/25/2019  . S/P total knee arthroplasty, left 02/11/2019  . Unilateral primary osteoarthritis, left knee 02/10/2019  . Arthritis of left knee 02/10/2019  . Rib fractures 07/01/2017  . Tobacco dependence 07/01/2017  . Derangement of posterior horn of medial meniscus of left knee   . Loose body of left knee   . Hypotension 08/14/2016  . Respiratory failure with hypoxia ([place])  08/14/2016  . Hyperglycemia 08/14/2016  . Alcohol abuse 08/14/2016  . Cocaine abuse ([place]) 08/14/2016  . Chronic pain 08/14/2016  . Oral thrush 08/14/2016  . Hypokalemia 05/31/2016  . Pre-syncope 05/31/2016  . Current smoker 07/20/2015  . Urge and stress incontinence 07/14/2015  . Hyperlipidemia 01/02/2015  . Insomnia 01/02/2015  . OAB (overactive bladder) 01/02/2015  . Depression 01/02/2015  . GERD (gastroesophageal reflux disease) 01/02/2015  . ARF (acute renal failure) ([place]) 12/21/2013  . COPD (chronic obstructive pulmonary disease) ([place]) 05/22/2012  . Dysfunctional uterine bleeding 04/01/2012  . Orthostatic hypotension 03/31/2012  . Encephalopathy acute 03/30/2012  . Chronic back pain   . Bipolar 1 disorder ([place])   . Anxiety    Past Medical History:  Diagnosis Date  . Anxiety   . Arthritis   . Bipolar 1 disorder ([place])   . Chronic back pain   . Chronic knee pain   . COPD (chronic obstructive pulmonary disease) ([place])   . Depression   . Dyspnea   . GERD (gastroesophageal reflux disease)   . History of anemia   . Hypertension   . Lumbar radiculopathy   . On home oxygen therapy "6 years ago"   3LPM PRN: "had this 6 years ago," pt no longer has O2 at home (07/01/2017)  . Sleep apnea  Family History  Problem Relation Age of Onset  . Hypertension Mother   . Hypertension Brother   . Anxiety disorder Brother   . Depression Brother     Past Surgical History:  Procedure Laterality Date  . COLONOSCOPY WITH PROPOFOL N/A 07/18/2014   Procedure: COLONOSCOPY WITH PROPOFOL;  Surgeon: Rogene Houston, MD;  Location: [facility];  Service: Endoscopy;  Laterality: N/A;  in cecum at 0753, withdrawal time 20 min  . DILATION AND CURETTAGE OF UTERUS  age 61  . KNEE ARTHROSCOPY WITH MEDIAL MENISECTOMY Left 10/10/2016   Procedure: KNEE ARTHROSCOPY WITH MEDIAL MENISECTOMY REMOVAL LOSE BODY;  Surgeon: Carole Civil, MD;  Location: [facility];  Service: Orthopedics;  Laterality: Left;  .  POLYPECTOMY  05/22/2012   Procedure: POLYPECTOMY;  Surgeon: Jonnie Kind, MD;  Location: [facility];  Service: Gynecology;  Laterality: N/A;  Endometrial Polypectomy  . POLYPECTOMY N/A 07/18/2014   Procedure: POLYPECTOMY;  Surgeon: Rogene Houston, MD;  Location: [facility];  Service: Endoscopy;  Laterality: N/A;  . TOTAL KNEE ARTHROPLASTY Left 02/10/2019   Procedure: LEFT TOTAL KNEE ARTHROPLASTY-CEMENTED;  Surgeon: Marybelle Killings, MD;  Location: [facility];  Service: Orthopedics;  Laterality: Left;  . TUBAL LIGATION     Social History   Occupational History  . Occupation: disabled  Tobacco Use  . Smoking status: Current Every Day Smoker    Packs/day: 0.50    Years: 41.00    Pack years: 20.50    Types: Cigarettes  . Smokeless tobacco: Never Used  Substance and Sexual Activity  . Alcohol use: Not Currently    Alcohol/week: 2.0 - 3.0 standard drinks    Types: 2 - 3 Glasses of wine per week    Comment: drinks wine several times a week  . Drug use: Yes    Frequency: 3.0 times per week    Types: Marijuana    Comment: history of cocaine and marijuana use, reports no use in about 6 months  . Sexual activity: Yes    Birth control/protection: Surgical    Comment: tubes tied

## 2019-03-29 ENCOUNTER — Ambulatory Visit: Payer: Medicaid Other | Admitting: Physical Therapy

## 2019-03-30 ENCOUNTER — Ambulatory Visit: Payer: Medicaid Other | Admitting: Physical Therapy

## 2019-04-05 ENCOUNTER — Ambulatory Visit: Payer: Medicaid Other | Admitting: Physical Therapy

## 2019-04-06 ENCOUNTER — Encounter: Payer: Medicaid Other | Admitting: Physical Therapy

## 2019-04-07 ENCOUNTER — Other Ambulatory Visit: Payer: Self-pay | Admitting: Orthopaedic Surgery

## 2019-04-07 NOTE — Telephone Encounter (Signed)
Can you please advise for Dr. Lorin Mercy?

## 2019-04-08 ENCOUNTER — Ambulatory Visit: Payer: Medicaid Other | Admitting: Physical Therapy

## 2019-04-12 NOTE — Telephone Encounter (Signed)
Called to pharmacy 

## 2019-04-12 NOTE — Telephone Encounter (Signed)
Okay to refill? 

## 2019-04-13 ENCOUNTER — Ambulatory Visit: Payer: Medicaid Other | Admitting: *Deleted

## 2019-04-15 ENCOUNTER — Ambulatory Visit: Payer: Medicaid Other | Admitting: *Deleted

## 2019-04-20 ENCOUNTER — Ambulatory Visit: Payer: Medicaid Other | Admitting: Physical Therapy

## 2019-04-22 ENCOUNTER — Inpatient Hospital Stay: Payer: Medicaid Other | Admitting: Orthopaedic Surgery

## 2019-04-22 ENCOUNTER — Other Ambulatory Visit: Payer: Self-pay

## 2019-04-28 ENCOUNTER — Encounter (HOSPITAL_COMMUNITY): Payer: Self-pay | Admitting: Emergency Medicine

## 2019-04-28 ENCOUNTER — Emergency Department (HOSPITAL_COMMUNITY)
Admission: EM | Admit: 2019-04-28 | Discharge: 2019-04-28 | Disposition: A | Discharge status: Home / Self Care | Payer: Medicaid Other | Attending: Emergency Medicine | Admitting: Emergency Medicine

## 2019-04-28 ENCOUNTER — Other Ambulatory Visit: Payer: Self-pay

## 2019-04-28 DIAGNOSIS — F17210 Nicotine dependence, cigarettes, uncomplicated: Secondary | ICD-10-CM | POA: Diagnosis not present

## 2019-04-28 DIAGNOSIS — J449 Chronic obstructive pulmonary disease, unspecified: Secondary | ICD-10-CM | POA: Diagnosis not present

## 2019-04-28 DIAGNOSIS — K029 Dental caries, unspecified: Secondary | ICD-10-CM | POA: Insufficient documentation

## 2019-04-28 DIAGNOSIS — I10 Essential (primary) hypertension: Secondary | ICD-10-CM | POA: Diagnosis not present

## 2019-04-28 DIAGNOSIS — K0889 Other specified disorders of teeth and supporting structures: Secondary | ICD-10-CM | POA: Diagnosis present

## 2019-04-28 DIAGNOSIS — N390 Urinary tract infection, site not specified: Secondary | ICD-10-CM

## 2019-04-28 DIAGNOSIS — Z7982 Long term (current) use of aspirin: Secondary | ICD-10-CM | POA: Diagnosis not present

## 2019-04-28 DIAGNOSIS — K047 Periapical abscess without sinus: Secondary | ICD-10-CM | POA: Insufficient documentation

## 2019-04-28 DIAGNOSIS — Z79899 Other long term (current) drug therapy: Secondary | ICD-10-CM | POA: Insufficient documentation

## 2019-04-28 LAB — URINALYSIS, ROUTINE W REFLEX MICROSCOPIC
Bilirubin Urine: NEGATIVE
Glucose, UA: NEGATIVE mg/dL
Hgb urine dipstick: NEGATIVE
Ketones, ur: NEGATIVE mg/dL
Nitrite: NEGATIVE
Protein, ur: 30 mg/dL — AB
Specific Gravity, Urine: 1.019 (ref 1.005–1.030)
WBC, UA: >50 WBC/hpf — ABNORMAL HIGH (ref 0–5)
pH: 5 (ref 5.0–8.0)

## 2019-04-28 MED ORDER — ONDANSETRON HCL 4 MG PO TABS
4.0000 mg | ORAL_TABLET | Freq: Once | ORAL | Status: AC
  Administered 2019-04-28: 13:00:00 4 mg via ORAL
  Filled 2019-04-28: qty 1

## 2019-04-28 MED ORDER — IBUPROFEN 800 MG PO TABS
800.0000 mg | ORAL_TABLET | Freq: Once | ORAL | Status: AC
  Administered 2019-04-28: 800 mg via ORAL
  Filled 2019-04-28: qty 1

## 2019-04-28 MED ORDER — CEPHALEXIN 500 MG PO CAPS: 500.0000 mg | ORAL_CAPSULE | Freq: Four times a day (QID) | ORAL | 0 refills | Status: DC

## 2019-04-28 MED ORDER — AMOXICILLIN 250 MG PO CAPS
500.0000 mg | ORAL_CAPSULE | Freq: Once | ORAL | Status: AC
  Administered 2019-04-28: 500 mg via ORAL
  Filled 2019-04-28: qty 2

## 2019-04-28 MED ORDER — TRAMADOL HCL 50 MG PO TABS
100.0000 mg | ORAL_TABLET | Freq: Once | ORAL | Status: AC
  Administered 2019-04-28: 100 mg via ORAL
  Filled 2019-04-28: qty 2

## 2019-04-28 NOTE — ED Triage Notes (Signed)
Patient went to the dentist 2 days ago. Has 3 cracked teeth. Was placed on antibiotic but states her Medicaid doesn't cover it. Wants antibiotic and pain medicine.

## 2019-04-28 NOTE — ED Notes (Signed)
Patient asked to be checked for UTI after Triage.

## 2019-04-28 NOTE — ED Provider Notes (Signed)
[facility] Provider Note   CSN: [id number] Arrival date & time: 04/28/19  1031     History   Chief Complaint Chief Complaint  Patient presents with  . Dental Pain    HPI Tiffany Lynch is a 58 y.o. female.     Patient is a 58 year old female who presents to the emergency department with a complaint of dental pain.  The patient states she has had problems with her teeth for some time.  She says now the problem is getting unbearable.  She had an emergency dental appointment with a dentist in [place] recently.  She was told that her teeth were infected and that she would need to be on antibiotics before dental procedure could be done.  She was also told that her blood pressure was elevated too high to do any procedures anyway.  She was given a letter and told to bring back a letter of clearance to the dentist.  It is also of note that the patient had knee replacement surgery a few months ago and there was concern that the patient should be on antibiotics before having a dental procedure done.  Patient states she has been trying Tylenol and ibuprofen, but she says this is just not helping her pain.  She was given a prescription for Amoxil during the dental appointment, but the dentist who wrote the prescription was not recognized by Medicaid, and the patient could not get the prescription filled.  The history is provided by the patient.  Dental Pain Associated symptoms: no congestion, no facial swelling and no neck pain     Past Medical History:  Diagnosis Date  . Anxiety   . Arthritis   . Bipolar 1 disorder ([place])   . Chronic back pain   . Chronic knee pain   . COPD (chronic obstructive pulmonary disease) ([place])   . Depression   . Dyspnea   . GERD (gastroesophageal reflux disease)   . History of anemia   . Hypertension   . Lumbar radiculopathy   . On home oxygen therapy "6 years ago"   3LPM PRN: "had this 6 years ago," pt no longer has O2 at home  (07/01/2017)  . Sleep apnea     Patient Active Problem List   Diagnosis Date Noted  . Impingement syndrome of left shoulder 03/25/2019  . S/P total knee arthroplasty, left 02/11/2019  . Unilateral primary osteoarthritis, left knee 02/10/2019  . Arthritis of left knee 02/10/2019  . Rib fractures 07/01/2017  . Tobacco dependence 07/01/2017  . Derangement of posterior horn of medial meniscus of left knee   . Loose body of left knee   . Hypotension 08/14/2016  . Respiratory failure with hypoxia ([place]) 08/14/2016  . Hyperglycemia 08/14/2016  . Alcohol abuse 08/14/2016  . Cocaine abuse ([place]) 08/14/2016  . Chronic pain 08/14/2016  . Oral thrush 08/14/2016  . Hypokalemia 05/31/2016  . Pre-syncope 05/31/2016  . Current smoker 07/20/2015  . Urge and stress incontinence 07/14/2015  . Hyperlipidemia 01/02/2015  . Insomnia 01/02/2015  . OAB (overactive bladder) 01/02/2015  . Depression 01/02/2015  . GERD (gastroesophageal reflux disease) 01/02/2015  . ARF (acute renal failure) ([place]) 12/21/2013  . COPD (chronic obstructive pulmonary disease) ([place]) 05/22/2012  . Dysfunctional uterine bleeding 04/01/2012  . Orthostatic hypotension 03/31/2012  . Encephalopathy acute 03/30/2012  . Chronic back pain   . Bipolar 1 disorder ([place])   . Anxiety     Past Surgical History:  Procedure Laterality Date  .  COLONOSCOPY WITH PROPOFOL N/A 07/18/2014   Procedure: COLONOSCOPY WITH PROPOFOL;  Surgeon: Rogene Houston, MD;  Location: [facility];  Service: Endoscopy;  Laterality: N/A;  in cecum at 0753, withdrawal time 20 min  . DILATION AND CURETTAGE OF UTERUS  age 26  . KNEE ARTHROSCOPY WITH MEDIAL MENISECTOMY Left 10/10/2016   Procedure: KNEE ARTHROSCOPY WITH MEDIAL MENISECTOMY REMOVAL LOSE BODY;  Surgeon: Carole Civil, MD;  Location: [facility];  Service: Orthopedics;  Laterality: Left;  . POLYPECTOMY  05/22/2012   Procedure: POLYPECTOMY;  Surgeon: Jonnie Kind, MD;  Location: [facility];  Service: Gynecology;   Laterality: N/A;  Endometrial Polypectomy  . POLYPECTOMY N/A 07/18/2014   Procedure: POLYPECTOMY;  Surgeon: Rogene Houston, MD;  Location: [facility];  Service: Endoscopy;  Laterality: N/A;  . TOTAL KNEE ARTHROPLASTY Left 02/10/2019   Procedure: LEFT TOTAL KNEE ARTHROPLASTY-CEMENTED;  Surgeon: Marybelle Killings, MD;  Location: [facility];  Service: Orthopedics;  Laterality: Left;  . TUBAL LIGATION       OB History    Gravida  2   Para  2   Term  2   Preterm      AB      Living  2     SAB      TAB      Ectopic      Multiple      Live Births               Home Medications    Prior to Admission medications   Medication Sig Start Date End Date Taking? Authorizing Provider  albuterol (PROVENTIL) (2.5 MG/3ML) 0.083% nebulizer solution Take 2.5 mg by nebulization 2 (two) times a day.    [provider]  amLODipine (NORVASC) 10 MG tablet Take 10 mg by mouth daily. 07/02/18   [provider]  aspirin EC 325 MG EC tablet Take 1 tablet (325 mg total) by mouth daily with breakfast. 02/16/19   Lanae Crumbly, PA-C  buPROPion (WELLBUTRIN XL) 300 MG 24 hr tablet Take 300 mg by mouth every morning.    [provider]  busPIRone (BUSPAR) 15 MG tablet Take 15 mg by mouth 3 (three) times daily.    [provider]  clonazePAM (KLONOPIN) 1 MG tablet Take 1 tablet (1 mg total) by mouth 2 (two) times daily. 02/15/19   Marybelle Killings, MD  doxepin (SINEQUAN) 75 MG capsule Take 75 mg by mouth at bedtime.  05/27/18   [provider]  DULoxetine (CYMBALTA) 60 MG capsule Take 60 mg by mouth 2 (two) times daily.     [provider]  Fluticasone-Umeclidin-Vilant (TRELEGY ELLIPTA) 100-62.5-25 MCG/INH AEPB Inhale 1 puff into the lungs daily.    [provider]  HYDROcodone-acetaminophen (NORCO/VICODIN) 5-325 MG tablet Take 1 tablet by mouth every 8 (eight) hours as needed for moderate pain. Post op total knee 03/06/19   Marybelle Killings, MD  metoprolol  tartrate (LOPRESSOR) 25 MG tablet Take 25 mg by mouth 2 (two) times daily. 06/16/17   [provider]  Multiple Vitamin (MULTIVITAMIN WITH MINERALS) TABS tablet Take 1 tablet by mouth daily.    [provider]  nitrofurantoin (MACRODANTIN) 100 MG capsule Take 100 mg by mouth 2 (two) times a day.    [provider]  Olopatadine HCl (PAZEO) 0.7 % SOLN Place 1 drop into both eyes daily.    [provider]  omeprazole (PRILOSEC) 20 MG capsule Take 20 mg by mouth daily.  07/29/16   [provider]  oxybutynin (DITROPAN-XL) 10 MG 24 hr tablet Take 10 mg by mouth daily. 03/09/19   [provider]  oxybutynin (DITROPAN-XL) 5 MG 24 hr tablet Take 5 mg by mouth daily.    [provider]  oxyCODONE-acetaminophen (PERCOCET) 5-325 MG tablet Take 1-2 tablets by mouth every 6 (six) hours as needed for severe pain. 02/15/19 02/15/20  Marybelle Killings, MD  oxyCODONE-acetaminophen (PERCOCET/ROXICET) 5-325 MG tablet Take 1 tablet by mouth every 4 (four) hours as needed for severe pain. 02/25/19   Marybelle Killings, MD  risperiDONE (RISPERDAL) 1 MG tablet Take 1 mg by mouth at bedtime.    [provider]  traMADol (ULTRAM) 50 MG tablet TAKE 1 TABLET (50 MG TOTAL) BY MOUTH EVERY 12 (TWELVE) HOURS AS NEEDED. 04/12/19   Lanae Crumbly, PA-C  VOLTAREN 1 % GEL Apply 1 g topically 4 (four) times daily as needed for pain. 09/16/16   [provider]  paliperidone (INVEGA) 6 MG 24 hr tablet Take 6 mg by mouth daily.    11/29/11  [provider]    Family History Family History  Problem Relation Age of Onset  . Hypertension Mother   . Hypertension Brother   . Anxiety disorder Brother   . Depression Brother     Social History Social History   Tobacco Use  . Smoking status: Current Every Day Smoker    Packs/day: 0.50    Years: 41.00    Pack years: 20.50    Types: Cigarettes  . Smokeless tobacco: Never Used  Substance Use Topics  . Alcohol use:  Not Currently    Alcohol/week: 2.0 - 3.0 standard drinks    Types: 2 - 3 Glasses of wine per week    Comment: drinks wine several times a week  . Drug use: Yes    Frequency: 3.0 times per week    Types: Marijuana    Comment: history of cocaine and marijuana use, reports no use in about 6 months     Allergies   Patient has no known allergies.   Review of Systems Review of Systems  Constitutional: Negative for activity change and appetite change.  HENT: Positive for dental problem. Negative for congestion, ear discharge, ear pain, facial swelling, nosebleeds, rhinorrhea, sneezing and tinnitus.   Eyes: Negative for photophobia, pain and discharge.  Respiratory: Negative for cough, choking, shortness of breath and wheezing.   Cardiovascular: Negative for chest pain, palpitations and leg swelling.  Gastrointestinal: Negative for abdominal pain, blood in stool, constipation, diarrhea, nausea and vomiting.  Genitourinary: Positive for dysuria. Negative for difficulty urinating, flank pain, frequency and hematuria.  Musculoskeletal: Negative for back pain, gait problem, myalgias and neck pain.  Skin: Negative for color change, rash and wound.  Neurological: Negative for dizziness, seizures, syncope, facial asymmetry, speech difficulty, weakness and numbness.  Hematological: Negative for adenopathy. Does not bruise/bleed easily.  Psychiatric/Behavioral: Negative for agitation, confusion, hallucinations, self-injury and suicidal ideas. The patient is not nervous/anxious.      Physical Exam Updated Vital Signs BP 115/76 (BP Location: Right Arm)   Pulse 76   Temp 98.3 F (36.8 C) (Oral)   Resp 16   LMP 05/06/2012   SpO2 97%   Physical Exam Vitals signs and nursing note reviewed.  Constitutional:      Appearance: She is well-developed. She is not toxic-appearing.  HENT:     Head: Normocephalic.     Right Ear: Tympanic membrane and external ear  normal.     Left Ear: Tympanic  membrane and external ear normal.     Mouth/Throat:     Comments: There are multiple dental caries present of the upper and lower gum area.  There are some broken and chipped teeth noted.  There is mild swelling of the gum, upper more so than lower, but no visible abscess.  The airway is patent.  The uvula is in the midline.  There is no swelling under the tongue. Eyes:     General: Lids are normal.     Pupils: Pupils are equal, round, and reactive to light.  Neck:     Musculoskeletal: Normal range of motion and neck supple.     Vascular: No carotid bruit.  Cardiovascular:     Rate and Rhythm: Normal rate and regular rhythm.     Pulses: Normal pulses.     Heart sounds: Normal heart sounds.  Pulmonary:     Effort: No respiratory distress.     Breath sounds: Rhonchi present.     Comments: Few scattered rhonchi noted on lung examination.  There is symmetrical rise and fall of the chest noted. Abdominal:     General: Bowel sounds are normal.     Palpations: Abdomen is soft.     Tenderness: There is no abdominal tenderness. There is no right CVA tenderness, left CVA tenderness or guarding.  Musculoskeletal: Normal range of motion.  Lymphadenopathy:     Head:     Right side of head: No submandibular adenopathy.     Left side of head: No submandibular adenopathy.     Cervical: No cervical adenopathy.  Skin:    General: Skin is warm and dry.  Neurological:     Mental Status: She is alert and oriented to person, place, and time.     Cranial Nerves: No cranial nerve deficit.     Sensory: No sensory deficit.  Psychiatric:        Speech: Speech normal.      ED Treatments / Results  Labs (all labs ordered are listed, but only abnormal results are displayed) Labs Reviewed  URINALYSIS, ROUTINE W REFLEX MICROSCOPIC - Abnormal; Notable for the following components:      Result Value   APPearance CLOUDY (*)    Protein, ur 30 (*)    Leukocytes,Ua LARGE (*)    WBC, UA >50 (*)     Bacteria, UA RARE (*)    All other components within normal limits    EKG None  Radiology No results found.  Procedures Procedures (including critical care time)  Medications Ordered in ED Medications  amoxicillin (AMOXIL) capsule 500 mg (has no administration in time range)  traMADol (ULTRAM) tablet 100 mg (has no administration in time range)  ibuprofen (ADVIL) tablet 800 mg (has no administration in time range)  ondansetron (ZOFRAN) tablet 4 mg (has no administration in time range)     Initial Impression / Assessment and Plan / ED Course  I have reviewed the triage vital signs and the nursing notes.  Pertinent labs & imaging results that were available during my care of the patient were reviewed by me and considered in my medical decision making (see chart for details).          Final Clinical Impressions(s) / ED Diagnoses MDM  Vital signs are within normal limits.  Blood pressure is 115/76 today.  Apparently the blood pressure was significantly elevated at the dental office recently.  Patient has multiple dental caries, but  no evidence for Ludewig's angina, and no evidence for acute visible abscess at this time.  The patient requested to be evaluated for urinary tract infection.  The urine analysis shows a cloudy yellow specimen with a specific gravity of 1.019.  There is a large leukocyte esterase present, and greater than 50 white blood cells with rare bacteria present.  There is also noted 11-20 squamous cells.  The patient's urine will be sent to the lab for culture.  In the interim the patient will be placed on Keflex 500 mg 4 times daily for both her dental infection as well as for possible urinary tract infection.  I called the patient's dentist to get a better understanding of what was needed for a "clearance letter".  I discussed the case with the dental office.  I have asked the patient to use the Keflex 4 times daily.  I have asked her to continue the Tylenol  and ibuprofen for pain and discomfort.  I have asked her to see her primary physician concerning her blood pressure and to see if there needs to be any adjustments in the medications.  I reassured her that the blood pressure was in excellent range today.  She will see the primary physician for the clearance letter.  The patient was treated in the emergency department with Amoxil, Ultram, and ibuprofen.  Patient acknowledges understanding of the need for the next chain of events.   Final diagnoses:  Infected dental caries  Urinary tract infection without hematuria, site unspecified    ED Discharge Orders         Ordered    cephALEXin (KEFLEX) 500 MG capsule  4 times daily     04/28/19 1255           Lily Kocher, PA-C 04/28/19 1919    Fredia Sorrow, MD 05/08/19 1155

## 2019-04-28 NOTE — Discharge Instructions (Addendum)
Your blood pressure is in excellent range today, at 115/76.  Your temperature is normal at 98.3, and your pulse rate is normal at 76.  Your examination shows multiple infected teeth.  Your urine analysis shows evidence of a urinary tract infection.  A culture of your urine has been sent to the lab.  Please use Keflex 500 mg 4 times daily with each meal and at bedtime.  Please use 600 mg of ibuprofen with each meal and at bedtime.  May use Tylenol in between the ibuprofen doses.  You were treated in the emergency department with a narcotic pain medication.  This medication may cause drowsiness, please use caution getting around.  Please see your primary physician as soon as possible so that they can review your antibiotic medication as well as your blood pressure medication and also monitor your blood pressure.  Your primary physician will be the one to give you the clearance letter for your dentist.

## 2019-04-29 ENCOUNTER — Other Ambulatory Visit: Payer: Self-pay

## 2019-04-29 ENCOUNTER — Ambulatory Visit (INDEPENDENT_AMBULATORY_CARE_PROVIDER_SITE_OTHER): Payer: Medicaid Other | Admitting: Orthopaedic Surgery

## 2019-04-29 VITALS — BP 119/76 | HR 74 | Ht 63.0 in | Wt 210.0 lb

## 2019-04-29 DIAGNOSIS — Z96652 Presence of left artificial knee joint: Secondary | ICD-10-CM

## 2019-04-30 ENCOUNTER — Encounter: Payer: Self-pay | Admitting: Orthopaedic Surgery

## 2019-04-30 LAB — URINE CULTURE
Culture: >=100000 — AB
Special Requests: NORMAL

## 2019-05-01 ENCOUNTER — Telehealth: Payer: Self-pay | Admitting: Emergency Medicine

## 2019-05-01 NOTE — Telephone Encounter (Signed)
Post ED Visit - Positive Culture Follow-up  Culture report reviewed by antimicrobial stewardship pharmacist: [facility] Team []  Elenor Quinones, Pharm.D. []  Heide Guile, Pharm.D., BCPS AQ-ID []  Parks Neptune, Pharm.D., BCPS []  Alycia Rossetti, Pharm.D., BCPS []  [place], Pharm.D., BCPS, AAHIVP []  Legrand Como, Pharm.D., BCPS, AAHIVP [x]  Salome Arnt, PharmD, BCPS []  Johnnette Gourd, PharmD, BCPS []  Hughes Better, PharmD, BCPS []  Leeroy Cha, PharmD []  Laqueta Linden, PharmD, BCPS []  Albertina Parr, PharmD  [facility] Team []  Leodis Sias, PharmD []  Lindell Spar, PharmD []  Royetta Asal, PharmD []  Graylin Shiver, Rph []  Rema Fendt) Glennon Mac, PharmD []  Arlyn Dunning, PharmD []  Netta Cedars, PharmD []  Dia Sitter, PharmD []  Leone Haven, PharmD []  Gretta Arab, PharmD []  Theodis Shove, PharmD []  Peggyann Juba, PharmD []  Reuel Boom, PharmD   Positive urine culture Treated with Cephalexin, organism sensitive to the same and no further patient follow-up is required at this time.  Sandi Raveling Blannie Shedlock 05/01/2019, 1:58 PM

## 2019-05-03 NOTE — Progress Notes (Signed)
Post-Op Visit Note   Patient: Tiffany Lynch           Date of Birth: 16-Jul-1961           MRN: [id number] Visit Date: 04/29/2019 PCP: Adaline Sill, NP   Assessment & Plan: Post left total knee arthroplasty.  She has good range of motion good quad strength is done well with therapy at home.  She starts outpatient therapy next week.  She requested more pain medication with her past history of narcotic use, bipolar disorder we discussed to be best to use Tylenol and ice.  Bettles profile score Narcotic 470.   Chief Complaint:  Chief Complaint  Patient presents with  . Left Knee - Follow-up    02/11/2019 Left TKA Cemented   Visit Diagnoses:  1. S/P total knee arthroplasty, left     Plan: 57 year old female returns post total knee arthroplasty.  She states she has had some recent   Follow-Up Instructions: No follow-ups on file.   Orders:  No orders of the defined types were placed in this encounter.  No orders of the defined types were placed in this encounter.   Imaging: No results found.  PMFS History: Patient Active Problem List   Diagnosis Date Noted  . Impingement syndrome of left shoulder 03/25/2019  . S/P total knee arthroplasty, left 02/11/2019  . Rib fractures 07/01/2017  . Tobacco dependence 07/01/2017  . Hypotension 08/14/2016  . Respiratory failure with hypoxia ([place]) 08/14/2016  . Hyperglycemia 08/14/2016  . Alcohol abuse 08/14/2016  . Cocaine abuse ([place]) 08/14/2016  . Chronic pain 08/14/2016  . Oral thrush 08/14/2016  . Hypokalemia 05/31/2016  . Pre-syncope 05/31/2016  . Current smoker 07/20/2015  . Urge and stress incontinence 07/14/2015  . Hyperlipidemia 01/02/2015  . Insomnia 01/02/2015  . OAB (overactive bladder) 01/02/2015  . Depression 01/02/2015  . GERD (gastroesophageal reflux disease) 01/02/2015  . ARF (acute renal failure) ([place]) 12/21/2013  . COPD (chronic obstructive pulmonary disease) ([place]) 05/22/2012  . Dysfunctional uterine bleeding  04/01/2012  . Orthostatic hypotension 03/31/2012  . Encephalopathy acute 03/30/2012  . Chronic back pain   . Bipolar 1 disorder ([place])   . Anxiety    Past Medical History:  Diagnosis Date  . Anxiety   . Arthritis   . Bipolar 1 disorder ([place])   . Chronic back pain   . Chronic knee pain   . COPD (chronic obstructive pulmonary disease) ([place])   . Depression   . Dyspnea   . GERD (gastroesophageal reflux disease)   . History of anemia   . Hypertension   . Lumbar radiculopathy   . On home oxygen therapy "6 years ago"   3LPM PRN: "had this 6 years ago," pt no longer has O2 at home (07/01/2017)  . Sleep apnea     Family History  Problem Relation Age of Onset  . Hypertension Mother   . Hypertension Brother   . Anxiety disorder Brother   . Depression Brother     Past Surgical History:  Procedure Laterality Date  . COLONOSCOPY WITH PROPOFOL N/A 07/18/2014   Procedure: COLONOSCOPY WITH PROPOFOL;  Surgeon: Rogene Houston, MD;  Location: [facility];  Service: Endoscopy;  Laterality: N/A;  in cecum at 0753, withdrawal time 20 min  . DILATION AND CURETTAGE OF UTERUS  age 27  . KNEE ARTHROSCOPY WITH MEDIAL MENISECTOMY Left 10/10/2016   Procedure: KNEE ARTHROSCOPY WITH MEDIAL MENISECTOMY REMOVAL LOSE BODY;  Surgeon: Carole Civil, MD;  Location: [facility];  Service: Orthopedics;  Laterality: Left;  . POLYPECTOMY  05/22/2012   Procedure: POLYPECTOMY;  Surgeon: Jonnie Kind, MD;  Location: [facility];  Service: Gynecology;  Laterality: N/A;  Endometrial Polypectomy  . POLYPECTOMY N/A 07/18/2014   Procedure: POLYPECTOMY;  Surgeon: Rogene Houston, MD;  Location: [facility];  Service: Endoscopy;  Laterality: N/A;  . TOTAL KNEE ARTHROPLASTY Left 02/10/2019   Procedure: LEFT TOTAL KNEE ARTHROPLASTY-CEMENTED;  Surgeon: Marybelle Killings, MD;  Location: [facility];  Service: Orthopedics;  Laterality: Left;  . TUBAL LIGATION     Social History   Occupational History  . Occupation: disabled  Tobacco Use  . Smoking  status: Current Every Day Smoker    Packs/day: 0.50    Years: 41.00    Pack years: 20.50    Types: Cigarettes  . Smokeless tobacco: Never Used  Substance and Sexual Activity  . Alcohol use: Not Currently    Alcohol/week: 2.0 - 3.0 standard drinks    Types: 2 - 3 Glasses of wine per week    Comment: drinks wine several times a week  . Drug use: Yes    Frequency: 3.0 times per week    Types: Marijuana    Comment: history of cocaine and marijuana use, reports no use in about 6 months  . Sexual activity: Yes    Birth control/protection: Surgical    Comment: tubes tied

## 2019-05-20 ENCOUNTER — Encounter: Payer: Self-pay | Admitting: Orthopaedic Surgery

## 2019-05-20 ENCOUNTER — Other Ambulatory Visit: Payer: Self-pay

## 2019-05-20 ENCOUNTER — Ambulatory Visit (INDEPENDENT_AMBULATORY_CARE_PROVIDER_SITE_OTHER): Payer: Medicaid Other

## 2019-05-20 ENCOUNTER — Ambulatory Visit (INDEPENDENT_AMBULATORY_CARE_PROVIDER_SITE_OTHER): Payer: Medicaid Other | Admitting: Orthopaedic Surgery

## 2019-05-20 VITALS — BP 128/83 | HR 77 | Ht 63.0 in | Wt 215.0 lb

## 2019-05-20 DIAGNOSIS — M5126 Other intervertebral disc displacement, lumbar region: Secondary | ICD-10-CM | POA: Diagnosis not present

## 2019-05-20 DIAGNOSIS — S80219A Abrasion, unspecified knee, initial encounter: Secondary | ICD-10-CM | POA: Insufficient documentation

## 2019-05-20 DIAGNOSIS — Z96652 Presence of left artificial knee joint: Secondary | ICD-10-CM

## 2019-05-20 DIAGNOSIS — M545 Low back pain, unspecified: Secondary | ICD-10-CM

## 2019-05-20 DIAGNOSIS — S80212A Abrasion, left knee, initial encounter: Secondary | ICD-10-CM

## 2019-05-20 MED ORDER — TRAMADOL HCL 50 MG PO TABS: 50.0000 mg | ORAL_TABLET | Freq: Two times a day (BID) | ORAL | 0 refills | Status: DC | PRN
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
## 2019-05-24 ENCOUNTER — Telehealth: Payer: Self-pay | Admitting: Orthopaedic Surgery

## 2019-05-24 NOTE — Telephone Encounter (Signed)
Patient called advised she has a dental appointment Friday morning at 9:00am and she need 2 capsules for that appointment. The number to contact patient is [phone]

## 2019-05-24 NOTE — Telephone Encounter (Signed)
Do amoxicillin 500mg  . # 3 tabs one night before and 2 before procedure thanks. Also had recent UTI

## 2019-05-24 NOTE — Telephone Encounter (Signed)
Please advise. Did you want patient to be on an antibiotic prior to dental work?

## 2019-05-25 MED ORDER — AMOXICILLIN 500 MG PO TABS: ORAL_TABLET | ORAL | 0 refills | Status: DC

## 2019-05-25 NOTE — Telephone Encounter (Signed)
Rx sent to pharmacy. I called patient and gave instructions.

## 2019-06-08 ENCOUNTER — Encounter: Payer: Medicaid Other | Admitting: Physical Medicine and Rehabilitation

## 2019-06-08 ENCOUNTER — Telehealth: Payer: Self-pay | Admitting: Physical Medicine and Rehabilitation

## 2019-06-08 NOTE — Telephone Encounter (Signed)
Patient called requesting to cancel her appointment due to her brother passing away.  She would like to r/s as soon as possible.  CB#[phone].  Thank you.

## 2019-06-08 NOTE — Telephone Encounter (Signed)
Patient has been rescheduled.

## 2019-06-21 ENCOUNTER — Telehealth: Payer: Self-pay | Admitting: Orthopaedic Surgery

## 2019-06-21 NOTE — Telephone Encounter (Signed)
I tried to reach patient. Was disconnected the first time, busy next 2 times I called. Dr. Lorin Mercy does not prescribe antibiotics prior to dental work, none are needed.

## 2019-06-21 NOTE — Telephone Encounter (Signed)
Patient called asked if she can get an antibiotic called into the [facility] in [place]. Ph# is [phone]   Patient said she is going to the dentist tomorrow around  1:00pm. Patient said she has a cracked tooth. The number to contact patient is [phone]

## 2019-06-24 ENCOUNTER — Ambulatory Visit (INDEPENDENT_AMBULATORY_CARE_PROVIDER_SITE_OTHER): Payer: Medicaid Other | Admitting: Surgery

## 2019-06-24 ENCOUNTER — Encounter: Payer: Self-pay | Admitting: Surgery

## 2019-06-24 VITALS — BP 118/70 | HR 70 | Ht 63.0 in | Wt 225.0 lb

## 2019-06-24 DIAGNOSIS — M25512 Pain in left shoulder: Secondary | ICD-10-CM | POA: Diagnosis not present

## 2019-06-24 MED ORDER — METHYLPREDNISOLONE ACETATE 40 MG/ML IJ SUSP
40.0000 mg | INTRAMUSCULAR | Status: AC | PRN
  Administered 2019-06-24: 40 mg via INTRA_ARTICULAR

## 2019-06-24 MED ORDER — LIDOCAINE HCL 1 % IJ SOLN
3.0000 mL | INTRAMUSCULAR | Status: AC | PRN
  Administered 2019-06-24: 3 mL

## 2019-06-24 MED ORDER — BUPIVACAINE HCL 0.25 % IJ SOLN
4.0000 mL | INTRAMUSCULAR | Status: AC | PRN
  Administered 2019-06-24: 4 mL via INTRA_ARTICULAR
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
## 2019-06-28 ENCOUNTER — Other Ambulatory Visit: Payer: Self-pay

## 2019-06-28 ENCOUNTER — Telehealth: Payer: Self-pay | Admitting: Orthopaedic Surgery

## 2019-06-28 ENCOUNTER — Encounter: Payer: Medicaid Other | Admitting: Physical Medicine and Rehabilitation

## 2019-06-28 NOTE — Telephone Encounter (Signed)
Patient left a voicemail stating that she has a dentist appointment on Thursday and is needing pre meds.  CB#[phone].  Thank you.

## 2019-06-29 NOTE — Telephone Encounter (Signed)
I called patient and advised. 

## 2019-06-29 NOTE — Telephone Encounter (Signed)
No premed needed for dental appt.  I called patient, no answer. Will try again.

## 2019-07-06 ENCOUNTER — Encounter (INDEPENDENT_AMBULATORY_CARE_PROVIDER_SITE_OTHER): Payer: Self-pay | Admitting: *Deleted

## 2019-07-15 ENCOUNTER — Ambulatory Visit: Payer: Medicaid Other | Admitting: Orthopaedic Surgery

## 2019-07-15 ENCOUNTER — Other Ambulatory Visit: Payer: Self-pay

## 2019-07-19 ENCOUNTER — Ambulatory Visit (INDEPENDENT_AMBULATORY_CARE_PROVIDER_SITE_OTHER): Payer: Medicaid Other | Admitting: Physical Medicine and Rehabilitation

## 2019-07-19 ENCOUNTER — Ambulatory Visit: Payer: Self-pay

## 2019-07-19 ENCOUNTER — Other Ambulatory Visit: Payer: Self-pay

## 2019-07-19 ENCOUNTER — Encounter: Payer: Self-pay | Admitting: Physical Medicine and Rehabilitation

## 2019-07-19 VITALS — BP 111/77 | HR 63

## 2019-07-19 DIAGNOSIS — M5416 Radiculopathy, lumbar region: Secondary | ICD-10-CM | POA: Diagnosis not present

## 2019-07-19 MED ORDER — BETAMETHASONE SOD PHOS & ACET 6 (3-3) MG/ML IJ SUSP
12.0000 mg | Freq: Once | INTRAMUSCULAR | Status: AC
  Administered 2019-07-19: 12 mg

## 2019-07-19 NOTE — Progress Notes (Signed)
 .  Numeric Pain Rating Scale and Functional Assessment Average Pain 8   In the last MONTH (on 0-10 scale) has pain interfered with the following?  1. General activity like being  able to carry out your everyday physical activities such as walking, climbing stairs, carrying groceries, or moving a chair?  Rating(7)   +Driver, -BT, -Dye Allergies.  

## 2019-07-29 ENCOUNTER — Ambulatory Visit: Payer: Medicaid Other | Admitting: Orthopaedic Surgery

## 2019-08-19 ENCOUNTER — Other Ambulatory Visit: Payer: Self-pay

## 2019-08-19 ENCOUNTER — Ambulatory Visit (INDEPENDENT_AMBULATORY_CARE_PROVIDER_SITE_OTHER): Payer: Medicaid Other | Admitting: Orthopaedic Surgery

## 2019-08-19 ENCOUNTER — Encounter: Payer: Self-pay | Admitting: Orthopaedic Surgery

## 2019-08-19 DIAGNOSIS — R0781 Pleurodynia: Secondary | ICD-10-CM

## 2019-08-19 MED ORDER — HYDROCODONE-ACETAMINOPHEN 5-325 MG PO TABS: 1.0000 | ORAL_TABLET | Freq: Two times a day (BID) | ORAL | 0 refills | Status: DC | PRN

## 2019-08-19 NOTE — Progress Notes (Signed)
Office Visit Note   Patient: Tiffany Lynch           Date of Birth: 1960-11-26           MRN: [id number] Visit Date: 08/19/2019              Requested by: Adaline Sill, NP [address] [place] [address] [place],  [place] [address] PCP: Adaline Sill, NP   Assessment & Plan: Visit Diagnoses:  1. Rib pain     Plan: Norco 5/325 tablet prescribed total number 5 tablets 1 p.o. every 12 as needed no refills.  She needs to continue walking program use a cane or an umbrella to decrease fall risk.  She has hand rails and needs to use them when she gets out of the shower slowdown.  She tends to rush sometimes and is an increased risk for falling.  I discussed with her no refills on her pain medication.  Return as needed.  Follow-Up Instructions: No follow-ups on file.   Orders:  No orders of the defined types were placed in this encounter.  Meds ordered this encounter  Medications  . HYDROcodone-acetaminophen (NORCO/VICODIN) 5-325 MG tablet    Sig: Take 1 tablet by mouth 2 (two) times daily as needed for moderate pain.    Dispense:  5 tablet    Refill:  0      Procedures: No procedures performed   Clinical Data: No additional findings.   Subjective: Chief Complaint  Patient presents with  . Lower Back - Pain  . Left Shoulder - Pain    HPI 58 year old female returns and states she slipped and fell in the bathtub x2 since her lumbar injection and bruised some ribs.  Past history of rib fractures.  No dyspnea she is walking without any ambulatory aids.  She states shoulder injection October helped for a period of time.  Past history of significant sedative and narcotic usage.  Review of Systems 14 point systems updated positive for weight loss of 16 pounds over the last year with a walking program.  Positive for bipolar disorder cocaine abuse COPD depression.  She has been in pain clinics in the past.  Shoulder problems.  Recent epidural lumbar injection.   Objective: Vital Signs:  BP (!) 151/102   Pulse 70   Ht 5\' 3"  (1.6 m)   Wt 208 lb (94.3 kg)   LMP 05/06/2012   BMI 36.85 kg/m   Physical Exam Constitutional:      Appearance: She is well-developed.  HENT:     Head: Normocephalic.     Right Ear: External ear normal.     Left Ear: External ear normal.  Eyes:     Pupils: Pupils are equal, round, and reactive to light.  Neck:     Thyroid: No thyromegaly.     Trachea: No tracheal deviation.  Cardiovascular:     Rate and Rhythm: Normal rate.  Pulmonary:     Effort: Pulmonary effort is normal.  Abdominal:     Palpations: Abdomen is soft.  Skin:    General: Skin is warm and dry.  Neurological:     Mental Status: She is alert and oriented to person, place, and time.  Psychiatric:        Behavior: Behavior normal.     Ortho Exam she goes from sitting to standing comfortably no increased respiratory rate no dyspnea lower extremity strength is intact. Specialty Comments:  No specialty comments available.  Imaging: No results  found.   PMFS History: Patient Active Problem List   Diagnosis Date Noted  . Rib pain 08/19/2019  . Protrusion of lumbar intervertebral disc 05/20/2019  . Knee abrasion 05/20/2019  . Impingement syndrome of left shoulder 03/25/2019  . S/P total knee arthroplasty, left 02/11/2019  . Rib fractures 07/01/2017  . Tobacco dependence 07/01/2017  . Hypotension 08/14/2016  . Respiratory failure with hypoxia ([place]) 08/14/2016  . Hyperglycemia 08/14/2016  . Alcohol abuse 08/14/2016  . Cocaine abuse ([place]) 08/14/2016  . Chronic pain 08/14/2016  . Oral thrush 08/14/2016  . Hypokalemia 05/31/2016  . Pre-syncope 05/31/2016  . Current smoker 07/20/2015  . Urge and stress incontinence 07/14/2015  . Hyperlipidemia 01/02/2015  . Insomnia 01/02/2015  . OAB (overactive bladder) 01/02/2015  . Depression 01/02/2015  . GERD (gastroesophageal reflux disease) 01/02/2015  . ARF (acute renal failure) ([place]) 12/21/2013  . COPD (chronic  obstructive pulmonary disease) ([place]) 05/22/2012  . Dysfunctional uterine bleeding 04/01/2012  . Orthostatic hypotension 03/31/2012  . Encephalopathy acute 03/30/2012  . Chronic back pain   . Bipolar 1 disorder ([place])   . Anxiety    Past Medical History:  Diagnosis Date  . Anxiety   . Arthritis   . Bipolar 1 disorder ([place])   . Chronic back pain   . Chronic knee pain   . COPD (chronic obstructive pulmonary disease) ([place])   . Depression   . Dyspnea   . GERD (gastroesophageal reflux disease)   . History of anemia   . Hypertension   . Lumbar radiculopathy   . On home oxygen therapy "6 years ago"   3LPM PRN: "had this 6 years ago," pt no longer has O2 at home (07/01/2017)  . Sleep apnea     Family History  Problem Relation Age of Onset  . Hypertension Mother   . Hypertension Brother   . Anxiety disorder Brother   . Depression Brother     Past Surgical History:  Procedure Laterality Date  . COLONOSCOPY WITH PROPOFOL N/A 07/18/2014   Procedure: COLONOSCOPY WITH PROPOFOL;  Surgeon: Rogene Houston, MD;  Location[facility];  Service: Endoscopy;  Laterality: N/A;  in cecum at 0753, withdrawal time 20 min  . DILATION AND CURETTAGE OF UTERUS  age 36  . KNEE ARTHROSCOPY WITH MEDIAL MENISECTOMY Left 10/10/2016   Procedure: KNEE ARTHROSCOPY WITH MEDIAL MENISECTOMY REMOVAL LOSE BODY;  Surgeon: Carole Civil, MD;  [facility];  Service: Orthopedics;  Laterality: Left;  . POLYPECTOMY  05/22/2012   Procedure: POLYPECTOMY;  Surgeon: Jonnie Kind, MD;  Location: [facility];  Service: Gynecology;  Laterality: N/A;  Endometrial Polypectomy  . POLYPECTOMY N/A 07/18/2014   Procedure: POLYPECTOMY;  Surgeon: Rogene Houston, MD;  Location[facility];  Service: Endoscopy;  Laterality: N/A;  . TOTAL KNEE ARTHROPLASTY Left 02/10/2019   Procedure: LEFT TOTAL KNEE ARTHROPLASTY-CEMENTED;  Surgeon: Marybelle Killings, MD;  Location: [facility];  Service: Orthopedics;  Laterality: Left;  . TUBAL LIGATION     Social  History   Occupational History  . Occupation: disabled  Tobacco Use  . Smoking status: Current Every Day Smoker    Packs/day: 0.50    Years: 41.00    Pack years: 20.50    Types: Cigarettes  . Smokeless tobacco: Never Used  Substance and Sexual Activity  . Alcohol use: Not Currently    Alcohol/week: 2.0 - 3.0 standard drinks    Types: 2 - 3 Glasses of wine per week    Comment: drinks wine  several times a week  . Drug use: Yes    Frequency: 3.0 times per week    Types: Marijuana    Comment: history of cocaine and marijuana use, reports no use in about 6 months  . Sexual activity: Yes    Birth control/protection: Surgical    Comment: tubes tied

## 2019-08-26 ENCOUNTER — Other Ambulatory Visit: Payer: Self-pay

## 2019-08-26 ENCOUNTER — Ambulatory Visit: Payer: Medicaid Other | Admitting: Orthopaedic Surgery

## 2019-09-09 ENCOUNTER — Ambulatory Visit: Payer: Medicaid Other | Admitting: Orthopaedic Surgery

## 2019-09-16 ENCOUNTER — Other Ambulatory Visit: Payer: Self-pay

## 2019-09-16 ENCOUNTER — Ambulatory Visit: Payer: Medicaid Other | Admitting: Orthopaedic Surgery

## 2019-09-23 ENCOUNTER — Ambulatory Visit: Payer: Medicaid Other | Admitting: Orthopaedic Surgery

## 2019-09-23 ENCOUNTER — Other Ambulatory Visit: Payer: Self-pay

## 2019-09-30 ENCOUNTER — Other Ambulatory Visit: Payer: Self-pay

## 2019-09-30 ENCOUNTER — Ambulatory Visit: Payer: Medicaid Other | Admitting: Orthopaedic Surgery

## 2019-10-04 NOTE — Procedures (Signed)
Lumbosacral Transforaminal Epidural Steroid Injection - Sub-Pedicular Approach with Fluoroscopic Guidance  Patient: Tiffany Lynch      Date of Birth: [birth year] MRN: [id number] PCP: Adaline Sill, NP      Visit Date: 07/19/2019   Universal Protocol:    Date/Time: 07/19/2019  Consent Given By: the patient  Position: PRONE  Additional Comments: Vital signs were monitored before and after the procedure. Patient was prepped and draped in the usual sterile fashion. The correct patient, procedure, and site was verified.   Injection Procedure Details:  Procedure Site One Meds Administered:  Meds ordered this encounter  Medications  . betamethasone acetate-betamethasone sodium phosphate (CELESTONE) injection 12 mg    Laterality: Right  Location/Site:  L5-S1  Needle size: 22 G  Needle type: Spinal  Needle Placement: Transforaminal  Findings:    -Comments: Excellent flow of contrast along the nerve and into the epidural space.  Flow was stenotic.  Procedure Details: After squaring off the end-plates to get a true AP view, the C-arm was positioned so that an oblique view of the foramen as noted above was visualized. The target area is just inferior to the "nose of the scotty dog" or sub pedicular. The soft tissues overlying this structure were infiltrated with 2-3 ml. of 1% Lidocaine without Epinephrine.  The spinal needle was inserted toward the target using a "trajectory" view along the fluoroscope beam.  Under AP and lateral visualization, the needle was advanced so it did not puncture dura and was located close the 6 O'Clock position of the pedical in AP tracterory. Biplanar projections were used to confirm position. Aspiration was confirmed to be negative for CSF and/or blood. A 1-2 ml. volume of Isovue-250 was injected and flow of contrast was noted at each level. Radiographs were obtained for documentation purposes.   After attaining the desired flow of contrast  documented above, a 0.5 to 1.0 ml test dose of 0.25% Marcaine was injected into each respective transforaminal space.  The patient was observed for 90 seconds post injection.  After no sensory deficits were reported, and normal lower extremity motor function was noted,   the above injectate was administered so that equal amounts of the injectate were placed at each foramen (level) into the transforaminal epidural space.   Additional Comments:  The patient tolerated the procedure well Dressing: 2 x 2 sterile gauze and Band-Aid    Post-procedure details: Patient was observed during the procedure. Post-procedure instructions were reviewed.  Patient left the clinic in stable condition.

## 2019-10-04 NOTE — Progress Notes (Signed)
Tiffany Lynch - 59 y.o. female MRN [id number]  Date of birth: 11-25-1960  Office Visit Note: Visit Date: 07/19/2019 PCP: Adaline Sill, NP Referred by: Adaline Sill, NP  Subjective: Chief Complaint  Patient presents with  . Lower Back - Pain  . Right Leg - Pain   HPI: Tiffany Lynch is a 59 y.o. female who comes in today At the request of Dr. Rodell Perna for right L5 transforaminal epidural steroid injection for right radicular leg pain.  The patient has failed conservative care including home exercise, medications, time and activity modification.  This injection will be diagnostic and hopefully therapeutic.  Please see requesting physician notes for further details and justification.   ROS Otherwise per HPI.  Assessment & Plan: Visit Diagnoses:  1. Lumbar radiculopathy     Plan: No additional findings.   Meds & Orders:  Meds ordered this encounter  Medications  . betamethasone acetate-betamethasone sodium phosphate (CELESTONE) injection 12 mg    Orders Placed This Encounter  Procedures  . XR C-ARM NO REPORT  . Epidural Steroid injection    Follow-up: Return for visit to requesting physician as needed.   Procedures: No procedures performed  Lumbosacral Transforaminal Epidural Steroid Injection - Sub-Pedicular Approach with Fluoroscopic Guidance  Patient: Tiffany Lynch      Date of Birth: [birth year] MRN: [id number] PCP: Adaline Sill, NP      Visit Date: 07/19/2019   Universal Protocol:    Date/Time: 07/19/2019  Consent Given By: the patient  Position: PRONE  Additional Comments: Vital signs were monitored before and after the procedure. Patient was prepped and draped in the usual sterile fashion. The correct patient, procedure, and site was verified.   Injection Procedure Details:  Procedure Site One Meds Administered:  Meds ordered this encounter  Medications  . betamethasone acetate-betamethasone sodium phosphate (CELESTONE) injection  12 mg    Laterality: Right  Location/Site:  L5-S1  Needle size: 22 G  Needle type: Spinal  Needle Placement: Transforaminal  Findings:    -Comments: Excellent flow of contrast along the nerve and into the epidural space.  Flow was stenotic.  Procedure Details: After squaring off the end-plates to get a true AP view, the C-arm was positioned so that an oblique view of the foramen as noted above was visualized. The target area is just inferior to the "nose of the scotty dog" or sub pedicular. The soft tissues overlying this structure were infiltrated with 2-3 ml. of 1% Lidocaine without Epinephrine.  The spinal needle was inserted toward the target using a "trajectory" view along the fluoroscope beam.  Under AP and lateral visualization, the needle was advanced so it did not puncture dura and was located close the 6 O'Clock position of the pedical in AP tracterory. Biplanar projections were used to confirm position. Aspiration was confirmed to be negative for CSF and/or blood. A 1-2 ml. volume of Isovue-250 was injected and flow of contrast was noted at each level. Radiographs were obtained for documentation purposes.   After attaining the desired flow of contrast documented above, a 0.5 to 1.0 ml test dose of 0.25% Marcaine was injected into each respective transforaminal space.  The patient was observed for 90 seconds post injection.  After no sensory deficits were reported, and normal lower extremity motor function was noted,   the above injectate was administered so that equal amounts of the injectate were placed at each foramen (level) into the transforaminal epidural space.   Additional Comments:  The patient tolerated the procedure well Dressing: 2 x 2 sterile gauze and Band-Aid    Post-procedure details: Patient was observed during the procedure. Post-procedure instructions were reviewed.  Patient left the clinic in stable condition.     Clinical History: MRI LUMBAR  SPINE WITHOUT CONTRAST  TECHNIQUE: Multiplanar, multisequence MR imaging of the lumbar spine was performed. No intravenous contrast was administered.  COMPARISON:  08/07/2011  FINDINGS: Segmentation:  Standard.  Alignment:  Normal.  Vertebrae: No fracture or osseous lesion. Minimal degenerative endplate edema at 075-GRM is new/increased. Mild type 2 endplate changes at 075-GRM have also slightly increased.  Conus medullaris: Extends to the L1-2 level and appears normal.  Paraspinal and other soft tissues: Chronic subcentimeter T2 hyperintense lesion in the left kidney, likely a cyst. Multiple small stones are present in the gallbladder.  Disc levels:  T12-L1: Mild disc space narrowing. Small right paracentral disc extrusion indenting the thecal sac but not resulting in significant stenosis, unchanged.  L1-2: Mild disc space narrowing. New minimal disc bulging without stenosis.  L2-3: Mild disc space narrowing. New mild disc bulging asymmetric to the right without significant stenosis.  L3-4: Mild disc space narrowing. New minimal disc bulging and mild facet arthrosis without stenosis.  L4-5: Mild disc bulging, small central disc protrusion, and minimal facet hypertrophy result in minimal bilateral lateral recess stenosis and mild right neural foraminal stenosis without significant spinal stenosis, unchanged. No L5 nerve root impingement in the lateral recesses.  L5-S1: Increased size of right foraminal to extraforaminal disc protrusion resulting in moderate right neural foraminal stenosis and potentially affecting the right L5 nerve lateral to the foramen. No spinal stenosis.  IMPRESSION: 1. Increased size of right foraminal/extraforaminal disc protrusion at L5-S1 with moderate right neural foraminal stenosis. 2. New mild disc bulging from L1-2 to L3-4 without stenosis. 3. Unchanged L4-5 disc degeneration with mild right neural  foraminal stenosis.   Electronically Signed   By: Logan Bores M.D.   On: 03/01/2017 14:50   She reports that she has been smoking cigarettes. She has a 20.50 pack-year smoking history. She has never used smokeless tobacco. No results for input(s): HGBA1C, LABURIC in the last 8760 hours.  Objective:  VS:  HT:    WT:   BMI:     BP:111/77  HR:63bpm  TEMP: ( )  RESP:  Physical Exam  Ortho Exam Imaging: No results found.  Past Medical/Family/Surgical/Social History: Medications & Allergies reviewed per EMR, new medications updated. Patient Active Problem List   Diagnosis Date Noted  . Rib pain 08/19/2019  . Protrusion of lumbar intervertebral disc 05/20/2019  . Knee abrasion 05/20/2019  . Impingement syndrome of left shoulder 03/25/2019  . S/P total knee arthroplasty, left 02/11/2019  . Rib fractures 07/01/2017  . Tobacco dependence 07/01/2017  . Hypotension 08/14/2016  . Respiratory failure with hypoxia ([place]) 08/14/2016  . Hyperglycemia 08/14/2016  . Alcohol abuse 08/14/2016  . Cocaine abuse ([place]) 08/14/2016  . Chronic pain 08/14/2016  . Oral thrush 08/14/2016  . Hypokalemia 05/31/2016  . Pre-syncope 05/31/2016  . Current smoker 07/20/2015  . Urge and stress incontinence 07/14/2015  . Hyperlipidemia 01/02/2015  . Insomnia 01/02/2015  . OAB (overactive bladder) 01/02/2015  . Depression 01/02/2015  . GERD (gastroesophageal reflux disease) 01/02/2015  . ARF (acute renal failure) ([place]) 12/21/2013  . COPD (chronic obstructive pulmonary disease) ([place]) 05/22/2012  . Dysfunctional uterine bleeding 04/01/2012  . Orthostatic hypotension 03/31/2012  . Encephalopathy acute 03/30/2012  . Chronic back pain   .  Bipolar 1 disorder ([place])   . Anxiety    Past Medical History:  Diagnosis Date  . Anxiety   . Arthritis   . Bipolar 1 disorder ([place])   . Chronic back pain   . Chronic knee pain   . COPD (chronic obstructive pulmonary disease) ([place])   . Depression   . Dyspnea    . GERD (gastroesophageal reflux disease)   . History of anemia   . Hypertension   . Lumbar radiculopathy   . On home oxygen therapy "6 years ago"   3LPM PRN: "had this 6 years ago," pt no longer has O2 at home (07/01/2017)  . Sleep apnea    Family History  Problem Relation Age of Onset  . Hypertension Mother   . Hypertension Brother   . Anxiety disorder Brother   . Depression Brother    Past Surgical History:  Procedure Laterality Date  . COLONOSCOPY WITH PROPOFOL N/A 07/18/2014   Procedure: COLONOSCOPY WITH PROPOFOL;  Surgeon: Rogene Houston, MD;  Location: [facility];  Service: Endoscopy;  Laterality: N/A;  in cecum at 0753, withdrawal time 20 min  . DILATION AND CURETTAGE OF UTERUS  age 64  . KNEE ARTHROSCOPY WITH MEDIAL MENISECTOMY Left 10/10/2016   Procedure: KNEE ARTHROSCOPY WITH MEDIAL MENISECTOMY REMOVAL LOSE BODY;  Surgeon: Carole Civil, MD;  Location: [facility];  Service: Orthopedics;  Laterality: Left;  . POLYPECTOMY  05/22/2012   Procedure: POLYPECTOMY;  Surgeon: Jonnie Kind, MD;  Location: [facility];  Service: Gynecology;  Laterality: N/A;  Endometrial Polypectomy  . POLYPECTOMY N/A 07/18/2014   Procedure: POLYPECTOMY;  Surgeon: Rogene Houston, MD;  Location: [facility];  Service: Endoscopy;  Laterality: N/A;  . TOTAL KNEE ARTHROPLASTY Left 02/10/2019   Procedure: LEFT TOTAL KNEE ARTHROPLASTY-CEMENTED;  Surgeon: Marybelle Killings, MD;  Location: [facility];  Service: Orthopedics;  Laterality: Left;  . TUBAL LIGATION     Social History   Occupational History  . Occupation: disabled  Tobacco Use  . Smoking status: Current Every Day Smoker    Packs/day: 0.50    Years: 41.00    Pack years: 20.50    Types: Cigarettes  . Smokeless tobacco: Never Used  Substance and Sexual Activity  . Alcohol use: Not Currently    Alcohol/week: 2.0 - 3.0 standard drinks    Types: 2 - 3 Glasses of wine per week    Comment: drinks wine several times a week  . Drug use: Yes    Frequency: 3.0 times  per week    Types: Marijuana    Comment: history of cocaine and marijuana use, reports no use in about 6 months  . Sexual activity: Yes    Birth control/protection: Surgical    Comment: tubes tied

## 2019-10-07 ENCOUNTER — Ambulatory Visit: Payer: Medicaid Other | Admitting: Orthopaedic Surgery

## 2019-11-11 ENCOUNTER — Ambulatory Visit (INDEPENDENT_AMBULATORY_CARE_PROVIDER_SITE_OTHER): Payer: Medicaid Other | Admitting: Orthopaedic Surgery

## 2019-11-11 ENCOUNTER — Other Ambulatory Visit: Payer: Self-pay

## 2019-11-11 ENCOUNTER — Encounter: Payer: Self-pay | Admitting: Orthopaedic Surgery

## 2019-11-11 VITALS — BP 102/56 | HR 70 | Ht 63.0 in | Wt 206.0 lb

## 2019-11-11 DIAGNOSIS — M7542 Impingement syndrome of left shoulder: Secondary | ICD-10-CM

## 2019-11-11 MED ORDER — LIDOCAINE HCL 1 % IJ SOLN
0.5000 mL | INTRAMUSCULAR | Status: AC | PRN
  Administered 2019-11-11: .5 mL

## 2019-11-11 MED ORDER — BUPIVACAINE HCL 0.25 % IJ SOLN
4.0000 mL | INTRAMUSCULAR | Status: AC | PRN
  Administered 2019-11-11: 4 mL via INTRA_ARTICULAR

## 2019-11-11 MED ORDER — METHYLPREDNISOLONE ACETATE 40 MG/ML IJ SUSP
40.0000 mg | INTRAMUSCULAR | Status: AC | PRN
  Administered 2019-11-11: 40 mg via INTRA_ARTICULAR

## 2019-11-11 NOTE — Progress Notes (Signed)
Office Visit Note   Patient: Tiffany Lynch           Date of Birth: [birth year]           MRN: [id number] Visit Date: 11/11/2019              Requested by: Adaline Sill, NP [address] [place],  [place] [address] PCP: Adaline Sill, NP   Assessment & Plan: Visit Diagnoses:  1. Impingement syndrome of left shoulder     Plan: Subacromial injection performed which she tolerated well.  Follow-Up Instructions: No follow-ups on file.   Orders:  No orders of the defined types were placed in this encounter.  No orders of the defined types were placed in this encounter.     Procedures: Large Joint Inj: L subacromial bursa on 11/11/2019 1:43 PM Indications: pain Details: 22 G 1.5 in needle  Arthrogram: No  Medications: 4 mL bupivacaine 0.25 %; 40 mg methylPREDNISolone acetate 40 MG/ML; 0.5 mL lidocaine 1 % Outcome: tolerated well, no immediate complications Procedure, treatment alternatives, risks and benefits explained, specific risks discussed. Consent was given by the patient. Immediately prior to procedure a time out was called to verify the correct patient, procedure, equipment, support staff and site/side marked as required. Patient was prepped and draped in the usual sterile fashion.       Clinical Data: No additional findings.   Subjective: Chief Complaint  Patient presents with  . Left Shoulder - Pain    HPI 59 year old female returns after missing several appointments.  She states she like a repeat injection in her left shoulder.  Good relief from the injection back in October.  In the interim she is in a different pain clinic.  She has past history of chronic pain, bipolar disorder,alcohol abuse, history of cocaine abuse in the past and has [place] risk site overall risk score of 560.  Review of Systems positive for continued tobacco dependence.  Past history of alcohol dependence, cocaine use chronic pain medication, previous encephalopathy,  COPD, bipolar disorder.  Otherwise negative as pertains to HPI.   Objective: Vital Signs: BP (!) 102/56   Pulse 70   Ht 5\' 3"  (1.6 m)   Wt 206 lb (93.4 kg)   LMP 05/06/2012   BMI 36.49 kg/m   Physical Exam Constitutional:      Appearance: She is well-developed.  HENT:     Head: Normocephalic.     Right Ear: External ear normal.     Left Ear: External ear normal.  Eyes:     Pupils: Pupils are equal, round, and reactive to light.  Neck:     Thyroid: No thyromegaly.     Trachea: No tracheal deviation.  Cardiovascular:     Rate and Rhythm: Normal rate.  Pulmonary:     Effort: Pulmonary effort is normal.  Abdominal:     Palpations: Abdomen is soft.  Skin:    General: Skin is warm and dry.  Neurological:     Mental Status: She is alert and oriented to person, place, and time.  Psychiatric:     Comments: Patient somnolent slurring her words, pleasant.  Pupils small but do react to light.     Ortho Exam patient get her arm up overhead with assistance actively she reaches 110 degrees.  Long head of biceps is stable.  Negative drop arm test.  Reflexes are 2+.  Sensation her hand is intact.  Specialty Comments:  No specialty comments  available.  Imaging: No results found.   PMFS History: Patient Active Problem List   Diagnosis Date Noted  . Rib pain 08/19/2019  . Protrusion of lumbar intervertebral disc 05/20/2019  . Knee abrasion 05/20/2019  . Impingement syndrome of left shoulder 03/25/2019  . S/P total knee arthroplasty, left 02/11/2019  . Rib fractures 07/01/2017  . Tobacco dependence 07/01/2017  . Hypotension 08/14/2016  . Respiratory failure with hypoxia ([place]) 08/14/2016  . Hyperglycemia 08/14/2016  . Alcohol abuse 08/14/2016  . Cocaine abuse ([place]) 08/14/2016  . Chronic pain 08/14/2016  . Oral thrush 08/14/2016  . Hypokalemia 05/31/2016  . Pre-syncope 05/31/2016  . Current smoker 07/20/2015  . Urge and stress incontinence 07/14/2015  . Hyperlipidemia  01/02/2015  . Insomnia 01/02/2015  . OAB (overactive bladder) 01/02/2015  . Depression 01/02/2015  . GERD (gastroesophageal reflux disease) 01/02/2015  . ARF (acute renal failure) ([place]) 12/21/2013  . COPD (chronic obstructive pulmonary disease) ([place]) 05/22/2012  . Dysfunctional uterine bleeding 04/01/2012  . Orthostatic hypotension 03/31/2012  . Encephalopathy acute 03/30/2012  . Chronic back pain   . Bipolar 1 disorder ([place])   . Anxiety    Past Medical History:  Diagnosis Date  . Anxiety   . Arthritis   . Bipolar 1 disorder ([place])   . Chronic back pain   . Chronic knee pain   . COPD (chronic obstructive pulmonary disease) ([place])   . Depression   . Dyspnea   . GERD (gastroesophageal reflux disease)   . History of anemia   . Hypertension   . Lumbar radiculopathy   . On home oxygen therapy "6 years ago"   3LPM PRN: "had this 6 years ago," pt no longer has O2 at home (07/01/2017)  . Sleep apnea     Family History  Problem Relation Age of Onset  . Hypertension Mother   . Hypertension Brother   . Anxiety disorder Brother   . Depression Brother     Past Surgical History:  Procedure Laterality Date  . COLONOSCOPY WITH PROPOFOL N/A 07/18/2014   Procedure: COLONOSCOPY WITH PROPOFOL;  Surgeon: Rogene Houston, MD;  Location[facility];  Service: Endoscopy;  Laterality: N/A;  in cecum at 0753, withdrawal time 20 min  . DILATION AND CURETTAGE OF UTERUS  age 89  . KNEE ARTHROSCOPY WITH MEDIAL MENISECTOMY Left 10/10/2016   Procedure: KNEE ARTHROSCOPY WITH MEDIAL MENISECTOMY REMOVAL LOSE BODY;  Surgeon: Carole Civil, MD;  Location[facility];  Service: Orthopedics;  Laterality: Left;  . POLYPECTOMY  05/22/2012   Procedure: POLYPECTOMY;  Surgeon: Jonnie Kind, MD;  Location: [facility];  Service: Gynecology;  Laterality: N/A;  Endometrial Polypectomy  . POLYPECTOMY N/A 07/18/2014   Procedure: POLYPECTOMY;  Surgeon: Rogene Houston, MD;  Location[facility];  Service: Endoscopy;  Laterality:  N/A;  . TOTAL KNEE ARTHROPLASTY Left 02/10/2019   Procedure: LEFT TOTAL KNEE ARTHROPLASTY-CEMENTED;  Surgeon: Marybelle Killings, MD;  Location: [facility];  Service: Orthopedics;  Laterality: Left;  . TUBAL LIGATION     Social History   Occupational History  . Occupation: disabled  Tobacco Use  . Smoking status: Current Every Day Smoker    Packs/day: 0.50    Years: 41.00    Pack years: 20.50    Types: Cigarettes  . Smokeless tobacco: Never Used  Substance and Sexual Activity  . Alcohol use: Not Currently    Alcohol/week: 2.0 - 3.0 standard drinks    Types: 2 - 3 Glasses of wine per week  Comment: drinks wine several times a week  . Drug use: Yes    Frequency: 3.0 times per week    Types: Marijuana    Comment: history of cocaine and marijuana use, reports no use in about 6 months  . Sexual activity: Yes    Birth control/protection: Surgical    Comment: tubes tied

## 2019-11-18 ENCOUNTER — Telehealth: Payer: Self-pay | Admitting: Radiology

## 2019-11-18 DIAGNOSIS — M7542 Impingement syndrome of left shoulder: Secondary | ICD-10-CM

## 2019-11-18 NOTE — Telephone Encounter (Signed)
Ok thanks [employer] after scan

## 2019-11-18 NOTE — Telephone Encounter (Signed)
Patient requests CT or MRI be ordered on shoulder. She continues to have pain. Please advise.  CB [phone]

## 2019-11-18 NOTE — Telephone Encounter (Signed)
Order entered. [facility] office staff to schedule. Patient advised.

## 2019-11-18 NOTE — Addendum Note (Signed)
Addended by: Meyer Cory on: 11/18/2019 05:00 PM   Modules accepted: Orders

## 2019-12-01 ENCOUNTER — Encounter: Payer: Self-pay | Admitting: Orthopaedic Surgery

## 2019-12-02 ENCOUNTER — Telehealth: Payer: Self-pay | Admitting: Radiology

## 2019-12-02 NOTE — Telephone Encounter (Signed)
Patient requests call with MRI results. Please advise.   CB for patient [phone] Report placed on your desk for review.

## 2019-12-02 NOTE — Telephone Encounter (Signed)
I called.

## 2019-12-02 NOTE — Telephone Encounter (Signed)
Ucall. Let her know medicaid nor any other insurance will allow her to just get everything scanned. They will not approve it. Thanks

## 2019-12-02 NOTE — Telephone Encounter (Signed)
Patient called and states that the medication given to her by the pain clinic does not help her back pain. She is requesting MRI's of her middle and low back so that she can find out everything that is wrong with her and you both decide on a treatment plan then.    Please advise. CB for patient is [phone]

## 2019-12-03 NOTE — Telephone Encounter (Signed)
I left voicemail for patient advising. 

## 2020-01-26 ENCOUNTER — Other Ambulatory Visit: Payer: Self-pay

## 2020-01-27 ENCOUNTER — Ambulatory Visit: Payer: Medicaid Other | Attending: Internal Medicine

## 2020-01-30 ENCOUNTER — Emergency Department (HOSPITAL_COMMUNITY)
Admission: EM | Admit: 2020-01-30 | Discharge: 2020-01-30 | Disposition: A | Discharge status: Home / Self Care | Payer: Medicaid Other | Attending: Emergency Medicine | Admitting: Emergency Medicine

## 2020-01-30 ENCOUNTER — Other Ambulatory Visit: Payer: Self-pay

## 2020-01-30 ENCOUNTER — Encounter (HOSPITAL_COMMUNITY): Payer: Self-pay

## 2020-01-30 ENCOUNTER — Emergency Department (HOSPITAL_COMMUNITY): Payer: Medicaid Other

## 2020-01-30 DIAGNOSIS — R498 Other voice and resonance disorders: Secondary | ICD-10-CM | POA: Diagnosis not present

## 2020-01-30 DIAGNOSIS — I10 Essential (primary) hypertension: Secondary | ICD-10-CM | POA: Insufficient documentation

## 2020-01-30 DIAGNOSIS — F17210 Nicotine dependence, cigarettes, uncomplicated: Secondary | ICD-10-CM | POA: Insufficient documentation

## 2020-01-30 DIAGNOSIS — M546 Pain in thoracic spine: Secondary | ICD-10-CM | POA: Diagnosis not present

## 2020-01-30 DIAGNOSIS — J449 Chronic obstructive pulmonary disease, unspecified: Secondary | ICD-10-CM | POA: Diagnosis not present

## 2020-01-30 DIAGNOSIS — Z7982 Long term (current) use of aspirin: Secondary | ICD-10-CM | POA: Diagnosis not present

## 2020-01-30 DIAGNOSIS — M25512 Pain in left shoulder: Secondary | ICD-10-CM | POA: Diagnosis not present

## 2020-01-30 DIAGNOSIS — M545 Low back pain: Secondary | ICD-10-CM | POA: Insufficient documentation

## 2020-01-30 DIAGNOSIS — M542 Cervicalgia: Secondary | ICD-10-CM | POA: Insufficient documentation

## 2020-01-30 DIAGNOSIS — Z79899 Other long term (current) drug therapy: Secondary | ICD-10-CM | POA: Insufficient documentation

## 2020-01-30 DIAGNOSIS — G8929 Other chronic pain: Secondary | ICD-10-CM

## 2020-01-30 MED ORDER — HYDROCODONE-ACETAMINOPHEN 5-325 MG PO TABS
1.0000 | ORAL_TABLET | Freq: Once | ORAL | Status: AC
  Administered 2020-01-30: 1 via ORAL
  Filled 2020-01-30: qty 1

## 2020-01-30 NOTE — ED Triage Notes (Addendum)
EMS reports pt c/o upper  Back pain and left shoulder pain that started this morning.  Denies injury.  Reports pain is chronic and goes to a pain clinic.  Reports pain worse this morning.  Also cbg 323 and c/o laryngitis.    Pt reports cough x 2 weeks.  PT says has had 1 covid shot and is due next week for her second one.

## 2020-01-30 NOTE — Discharge Instructions (Addendum)
You were seen in the emergency department today for shoulder and back pain.  Your x-ray and your CT scan showed degenerative changes but no new abnormalities.  Please follow-up with your pain management doctor tomorrow to have refills of your pain medications.  Return to the ER for new or worsening symptoms or any other concerns.

## 2020-01-30 NOTE — ED Provider Notes (Signed)
[facility] EMERGENCY DEPARTMENT Provider Note   CSN: [id number] Arrival date & time: 01/30/20  [id number]     History Chief Complaint  Patient presents with  . Shoulder Pain    Tiffany Lynch is a 59 y.o. female with a history of bipolar 1 disorder, anxiety, depression, COPD, hypertension, chronic back pain, impingement syndrome of the left shoulder, and substance abuse who presents to the emergency department with complaints of left shoulder pain & back pain this morning. Patient states that she has longstanding chronic back pain as well as left shoulder pain. She is being followed by orthopedics for the left shoulder pain- had steroid injection in March 2021 for this. She takes chronic narcotics, does not have any currently, most recent prescription for hydrocodone filled for 45 tablets on 01/05/20.  She states that this morning it seemed to hurt more than usual and she did not have her pain pills therefore she came to the ER.  Worse with movement.  No alleviating factors.  She states the pain in the shoulder radiates down the arm some.  She denies recent injury.  She thinks she might have lifted something heavy recently.  She denies numbness, tingling, weakness, IVDU, fever, chills, incontinence, saddle anesthesia, chest pain, or dyspnea.  She also mentions that she has been losing her voice, no sore throat, dyspnea, or cough.   HPI     Past Medical History:  Diagnosis Date  . Anxiety   . Arthritis   . Bipolar 1 disorder ([place])   . Chronic back pain   . Chronic knee pain   . COPD (chronic obstructive pulmonary disease) ([place])   . Depression   . Dyspnea   . GERD (gastroesophageal reflux disease)   . History of anemia   . Hypertension   . Lumbar radiculopathy   . On home oxygen therapy "6 years ago"   3LPM PRN: "had this 6 years ago," pt no longer has O2 at home (07/01/2017)  . Sleep apnea     Patient Active Problem List   Diagnosis Date Noted  . Rib pain 08/19/2019  . Protrusion  of lumbar intervertebral disc 05/20/2019  . Knee abrasion 05/20/2019  . Impingement syndrome of left shoulder 03/25/2019  . S/P total knee arthroplasty, left 02/11/2019  . Rib fractures 07/01/2017  . Tobacco dependence 07/01/2017  . Hypotension 08/14/2016  . Respiratory failure with hypoxia ([place]) 08/14/2016  . Hyperglycemia 08/14/2016  . Alcohol abuse 08/14/2016  . Cocaine abuse ([place]) 08/14/2016  . Chronic pain 08/14/2016  . Oral thrush 08/14/2016  . Hypokalemia 05/31/2016  . Pre-syncope 05/31/2016  . Current smoker 07/20/2015  . Urge and stress incontinence 07/14/2015  . Hyperlipidemia 01/02/2015  . Insomnia 01/02/2015  . OAB (overactive bladder) 01/02/2015  . Depression 01/02/2015  . GERD (gastroesophageal reflux disease) 01/02/2015  . ARF (acute renal failure) ([place]) 12/21/2013  . COPD (chronic obstructive pulmonary disease) ([place]) 05/22/2012  . Dysfunctional uterine bleeding 04/01/2012  . Orthostatic hypotension 03/31/2012  . Encephalopathy acute 03/30/2012  . Chronic back pain   . Bipolar 1 disorder ([place])   . Anxiety     Past Surgical History:  Procedure Laterality Date  . COLONOSCOPY WITH PROPOFOL N/A 07/18/2014   Procedure: COLONOSCOPY WITH PROPOFOL;  Surgeon: Rogene Houston, MD;  Location: [facility];  Service: Endoscopy;  Laterality: N/A;  in cecum at 0753, withdrawal time 20 min  . DILATION AND CURETTAGE OF UTERUS  age 37  . KNEE ARTHROSCOPY WITH MEDIAL MENISECTOMY Left 10/10/2016  Procedure: KNEE ARTHROSCOPY WITH MEDIAL MENISECTOMY REMOVAL LOSE BODY;  Surgeon: Carole Civil, MD;  Location: [facility];  Service: Orthopedics;  Laterality: Left;  . POLYPECTOMY  05/22/2012   Procedure: POLYPECTOMY;  Surgeon: Jonnie Kind, MD;  Location: [facility];  Service: Gynecology;  Laterality: N/A;  Endometrial Polypectomy  . POLYPECTOMY N/A 07/18/2014   Procedure: POLYPECTOMY;  Surgeon: Rogene Houston, MD;  Location: [facility];  Service: Endoscopy;  Laterality: N/A;  . TOTAL KNEE  ARTHROPLASTY Left 02/10/2019   Procedure: LEFT TOTAL KNEE ARTHROPLASTY-CEMENTED;  Surgeon: Marybelle Killings, MD;  Location: [place];  Service: Orthopedics;  Laterality: Left;  . TUBAL LIGATION       OB History    Gravida  2   Para  2   Term  2   Preterm      AB      Living  2     SAB      TAB      Ectopic      Multiple      Live Births              Family History  Problem Relation Age of Onset  . Hypertension Mother   . Hypertension Brother   . Anxiety disorder Brother   . Depression Brother     Social History   Tobacco Use  . Smoking status: Current Every Day Smoker    Packs/day: 0.50    Years: 41.00    Pack years: 20.50    Types: Cigarettes  . Smokeless tobacco: Never Used  Substance Use Topics  . Alcohol use: Not Currently    Alcohol/week: 2.0 - 3.0 standard drinks    Types: 2 - 3 Glasses of wine per week    Comment: drinks wine several times a week  . Drug use: Yes    Frequency: 3.0 times per week    Types: Marijuana    Comment: history of cocaine and marijuana use, reports no use in about 6 months    Home Medications Prior to Admission medications   Medication Sig Start Date End Date Taking? Authorizing Provider  albuterol (PROVENTIL) (2.5 MG/3ML) 0.083% nebulizer solution Take 2.5 mg by nebulization 2 (two) times a day.    [provider]  amLODipine (NORVASC) 10 MG tablet Take 10 mg by mouth daily. 07/02/18   [provider]  amoxicillin (AMOXIL) 500 MG tablet Take 3 tablets the night before procedure. Take 2 more tablets day of and prior to procedure. Patient not taking: Reported on 11/11/2019 05/25/19   Marybelle Killings, MD  aspirin EC 325 MG EC tablet Take 1 tablet (325 mg total) by mouth daily with breakfast. 02/16/19   Lanae Crumbly, PA-C  buPROPion (WELLBUTRIN XL) 300 MG 24 hr tablet Take 300 mg by mouth every morning.    [provider]  busPIRone (BUSPAR) 15 MG tablet Take 15 mg by mouth 3 (three) times daily.     [provider]  clonazePAM (KLONOPIN) 1 MG tablet Take 1 tablet (1 mg total) by mouth 2 (two) times daily. 02/15/19   Marybelle Killings, MD  doxepin (SINEQUAN) 75 MG capsule Take 75 mg by mouth at bedtime.  05/27/18   [provider]  DULoxetine (CYMBALTA) 60 MG capsule Take 60 mg by mouth 2 (two) times daily.     [provider]  Fluticasone-Umeclidin-Vilant (TRELEGY ELLIPTA) 100-62.5-25 MCG/INH AEPB Inhale 1 puff into the lungs daily.    [provider]  gabapentin (NEURONTIN) 300 MG capsule Take by mouth. 09/14/19   [provider]  HYDROcodone-acetaminophen (NORCO/VICODIN) 5-325 MG tablet Take 1 tablet by mouth 2 (two) times daily as needed for moderate pain. 08/19/19   Marybelle Killings, MD  ibuprofen (ADVIL) 800 MG tablet TAKE 1 TABLET BY MOUTH 3 TIMES A DAY WITH FOOD OR MILK AS NEEDED 04/02/19   [provider]  metoprolol tartrate (LOPRESSOR) 25 MG tablet Take 25 mg by mouth 2 (two) times daily. 06/16/17   [provider]  Multiple Vitamin (MULTIVITAMIN WITH MINERALS) TABS tablet Take 1 tablet by mouth daily.    [provider]  Olopatadine HCl (PAZEO) 0.7 % SOLN Place 1 drop into both eyes daily.    [provider]  omeprazole (PRILOSEC) 20 MG capsule Take 20 mg by mouth daily. 07/29/16   [provider]  oxybutynin (DITROPAN-XL) 10 MG 24 hr tablet Take 10 mg by mouth daily. 03/09/19   [provider]  oxybutynin (DITROPAN-XL) 5 MG 24 hr tablet Take 5 mg by mouth daily.    [provider]  oxycodone-acetaminophen (PERCOCET) 2.5-325 MG tablet Take 1 tablet by mouth every 6 (six) hours as needed for pain.    [provider]  risperiDONE (RISPERDAL) 1 MG tablet Take 1 mg by mouth at bedtime.    [provider]  traMADol (ULTRAM) 50 MG tablet Take 1 tablet (50 mg total) by mouth every 12 (twelve) hours as needed. Patient not taking: Reported on 11/11/2019 05/20/19   Marybelle Killings, MD    VOLTAREN 1 % GEL Apply 1 g topically 4 (four) times daily as needed for pain. 09/16/16   [provider]  paliperidone (INVEGA) 6 MG 24 hr tablet Take 6 mg by mouth daily.    11/29/11  [provider]    Allergies    Patient has no known allergies.  Review of Systems   Review of Systems  Constitutional: Negative for chills and fever.  HENT: Positive for voice change (hoarse). Negative for congestion, ear pain and sore throat (horase).   Respiratory: Negative for cough and shortness of breath.   Cardiovascular: Negative for chest pain.  Gastrointestinal: Negative for abdominal pain, nausea and vomiting.  Genitourinary: Negative for dysuria and hematuria.  Musculoskeletal: Positive for arthralgias, back pain and neck pain.  Skin: Negative for color change, rash and wound.  Neurological: Negative for weakness and numbness.       Negative for incontinence or saddle anesthesia.  All other systems reviewed and are negative.   Physical Exam Updated Vital Signs BP (!) 117/91 (BP Location: Left Arm)   Pulse 71   Temp 98.7 F (37.1 C) (Oral)   Resp 18   Ht 5\' 3"  (1.6 m)   Wt 93 kg   LMP 05/06/2012   SpO2 91%   BMI 36.31 kg/m   Physical Exam Vitals and nursing note reviewed.  Constitutional:      General: She is not in acute distress.    Appearance: Normal appearance. She is well-developed. She is not ill-appearing or toxic-appearing.  HENT:     Head: Normocephalic and atraumatic.     Mouth/Throat:     Mouth: Mucous membranes are dry.     Comments: Uvula midline.  No erythema, exudates, or tonsillar edema. Eyes:     General:        Right eye: No discharge.        Left eye: No discharge.     Conjunctiva/sclera: Conjunctivae  normal.  Cardiovascular:     Rate and Rhythm: Normal rate and regular rhythm.     Pulses:          Radial pulses are 2+ on the right side and 2+ on the left side.  Pulmonary:     Effort: Pulmonary effort is normal. No respiratory  distress.     Breath sounds: Normal breath sounds. No wheezing, rhonchi or rales.  Abdominal:     General: There is no distension.     Palpations: Abdomen is soft.     Tenderness: There is no abdominal tenderness. There is no guarding or rebound.  Musculoskeletal:     Cervical back: Neck supple. Spinous process tenderness (diffuse) and muscular tenderness (left sided) present.     Comments: Upper extremities: No obvious deformity, appreciable swelling, edema, erythema, ecchymosis, warmth, or open wounds. Patient has intact AROM throughout.  Patient tender to palpation to the diffuse left glenohumeral joint.  Otherwise nontender. Back: No point/focal vertebral tenderness or stepoff  Skin:    General: Skin is warm and dry.     Capillary Refill: Capillary refill takes less than 2 seconds.     Findings: No rash.  Neurological:     Mental Status: She is alert.     Comments: Alert. Clear speech. Sensation grossly intact to bilateral upper and lower extremities. 5/5 symmetric grip strength and strength with plantar dorsiflexion bilaterally.  Ambulatory.   Psychiatric:        Mood and Affect: Mood normal.        Behavior: Behavior normal.     ED Results / Procedures / Treatments   Labs (all labs ordered are listed, but only abnormal results are displayed) Labs Reviewed - No data to display  EKG None  Radiology CT Cervical Spine Wo Contrast  Result Date: 01/30/2020 CLINICAL DATA:  Neck pain. EXAM: CT CERVICAL SPINE WITHOUT CONTRAST TECHNIQUE: Multidetector CT imaging of the cervical spine was performed without intravenous contrast. Multiplanar CT image reconstructions were also generated. COMPARISON:  July 06, 2017 FINDINGS: Alignment: Normal. Skull base and vertebrae: No acute fracture. No primary bone lesion or focal pathologic process. Soft tissues and spinal canal: No prevertebral fluid or swelling. No visible canal hematoma. Disc levels: Multilevel mild to moderate osteoarthritic  changes with disc space narrowing, slight remodeling of the vertebral bodies and disc osteophyte complex formation. The findings are most severe at C6-C7, where there is bilateral neural foraminal narrowing. Milder bilateral neural foraminal narrowing at C4-C5. Upper chest: Slight compression deformity and focal sclerotic changes in the anterior superior endplate of T2 vertebral body may represent an age-indeterminate mild compression fracture. Other: None. IMPRESSION: 1. 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 Milder bilateral neural foraminal narrowing at C4-C5. 2. Slight compression deformity and focal sclerotic changes in the anterior superior endplate of T2 vertebral body may represent an age-indeterminate mild compression fracture. Electronically Signed   By: Fidela Salisbury M.D.   On: 01/30/2020 10:55   DG Shoulder Left  Result Date: 01/30/2020 CLINICAL DATA:  Fall 1 month ago with persistent left shoulder pain, initial encounter EXAM: LEFT SHOULDER - 2+ VIEW COMPARISON:  11/30/2018 FINDINGS: Degenerative changes of the acromioclavicular joint and glenohumeral joint are noted. No acute fracture or dislocation is noted. IMPRESSION: Degenerative change without acute abnormality. Electronically Signed   By: Inez Catalina M.D.   On: 01/30/2020 10:11  Procedures Procedures (including critical care time)  Medications Ordered in ED Medications - No data to display  ED Course  I have reviewed the triage vital signs and the nursing notes.  Pertinent labs & imaging results that were available during my care of the patient were reviewed by me and considered in my medical decision making (see chart for details).    MDM Rules/Calculators/A&P                     Patient presents to the emergency department with acute on chronic left shoulder and back pain.   She is nontoxic, resting comfortably, vitals without significant abnormality, her SPO2 is at 96% on my assessment despite 91% initially documented.  Patient's pain is reproducible with cervical midline and left paraspinal muscle palpation as well as palpation to the left lateral humeral joint.  She has no erythema, warmth, or open wounds, she is afebrile, no history of IVDU, low suspicion for septic joint, cellulitis, or epidural abscess.  She has no focal neurologic deficits to suggest cord compression or cauda equina.  I have ordered an x-ray of the left shoulder as well as a CT of the cervical spine.  I personally reviewed and interpreted imaging: L shoulder x-ray: Degenerative change without acute abnormality CT C-spine:  1. 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 Milder bilateral neural foraminal narrowing at C4-C5. 2. Slight compression deformity and focal sclerotic changes in the anterior superior endplate of T2 vertebral body may represent an age-indeterminate mild compression fracture. --> not focally tender to this spot, no recent injuries, favor non acute.   I have reviewed patient's chart as well as nursing notes for additional history.  Most recently received hydrocodone tablets 01/05/2020.  I suspect that her discomfort is an exacerbation of her chronic pain as she did not have any of her pain medications today.  She was given a dose of pain medicine in the emergency department and was encouraged to follow-up with her outpatient providers.  In regards to her hoarse voice concern, no signs of infection, no respiratory distress, airways patent, tolerating own secretions without difficulty.  She overall appears appropriate discharge home. I discussed results, treatment plan, need for follow-up, and return precautions with the patient. Provided opportunity for  questions, patient confirmed understanding and is in agreement with plan.   Final Clinical Impression(s) / ED Diagnoses Final diagnoses:  Chronic left shoulder pain    Rx / DC Orders ED Discharge Orders    None       Amaryllis Dyke, PA-C 01/30/20 1144    Fredia Sorrow, MD 02/29/20 2103

## 2020-02-01 ENCOUNTER — Other Ambulatory Visit: Payer: Self-pay

## 2020-02-01 ENCOUNTER — Emergency Department (HOSPITAL_COMMUNITY): Payer: Medicaid Other

## 2020-02-01 ENCOUNTER — Encounter (HOSPITAL_COMMUNITY): Payer: Self-pay

## 2020-02-01 ENCOUNTER — Emergency Department (HOSPITAL_COMMUNITY)
Admission: EM | Admit: 2020-02-01 | Discharge: 2020-02-01 | Disposition: A | Discharge status: Home / Self Care | Payer: Medicaid Other | Attending: Emergency Medicine | Admitting: Emergency Medicine

## 2020-02-01 DIAGNOSIS — R600 Localized edema: Secondary | ICD-10-CM | POA: Diagnosis not present

## 2020-02-01 DIAGNOSIS — I10 Essential (primary) hypertension: Secondary | ICD-10-CM | POA: Diagnosis not present

## 2020-02-01 DIAGNOSIS — J040 Acute laryngitis: Secondary | ICD-10-CM | POA: Insufficient documentation

## 2020-02-01 DIAGNOSIS — J441 Chronic obstructive pulmonary disease with (acute) exacerbation: Secondary | ICD-10-CM | POA: Diagnosis not present

## 2020-02-01 DIAGNOSIS — Z79899 Other long term (current) drug therapy: Secondary | ICD-10-CM | POA: Diagnosis not present

## 2020-02-01 DIAGNOSIS — Z20822 Contact with and (suspected) exposure to covid-19: Secondary | ICD-10-CM | POA: Insufficient documentation

## 2020-02-01 DIAGNOSIS — R05 Cough: Secondary | ICD-10-CM | POA: Diagnosis present

## 2020-02-01 DIAGNOSIS — F17210 Nicotine dependence, cigarettes, uncomplicated: Secondary | ICD-10-CM | POA: Insufficient documentation

## 2020-02-01 LAB — CBC WITH DIFFERENTIAL/PLATELET
Abs Immature Granulocytes: 0.06 10*3/uL (ref 0.00–0.07)
Basophils Absolute: 0 10*3/uL (ref 0.0–0.1)
Basophils Relative: 0 %
Eosinophils Absolute: 0.2 10*3/uL (ref 0.0–0.5)
Eosinophils Relative: 2 %
HCT: 36 % (ref 36.0–46.0)
Hemoglobin: 11.5 g/dL — ABNORMAL LOW (ref 12.0–15.0)
Immature Granulocytes: 1 %
Lymphocytes Relative: 25 %
Lymphs Abs: 2.8 10*3/uL (ref 0.7–4.0)
MCH: 30.1 pg (ref 26.0–34.0)
MCHC: 31.9 g/dL (ref 30.0–36.0)
MCV: 94.2 fL (ref 80.0–100.0)
Monocytes Absolute: 0.9 10*3/uL (ref 0.1–1.0)
Monocytes Relative: 8 %
Neutro Abs: 7.2 10*3/uL (ref 1.7–7.7)
Neutrophils Relative %: 64 %
Platelets: 369 10*3/uL (ref 150–400)
RBC: 3.82 MIL/uL — ABNORMAL LOW (ref 3.87–5.11)
RDW: 15.2 % (ref 11.5–15.5)
WBC: 11.2 10*3/uL — ABNORMAL HIGH (ref 4.0–10.5)
nRBC: 0 % (ref 0.0–0.2)

## 2020-02-01 LAB — BASIC METABOLIC PANEL
Anion gap: 12 (ref 5–15)
BUN: 7 mg/dL (ref 6–20)
CO2: 29 mmol/L (ref 22–32)
Calcium: 8.6 mg/dL — ABNORMAL LOW (ref 8.9–10.3)
Chloride: 95 mmol/L — ABNORMAL LOW (ref 98–111)
Creatinine, Ser: 0.85 mg/dL (ref 0.44–1.00)
GFR calc Af Amer: >60 mL/min (ref >60)
GFR calc non Af Amer: >60 mL/min (ref >60)
Glucose, Bld: 144 mg/dL — ABNORMAL HIGH (ref 70–99)
Potassium: 2.4 mmol/L — CL (ref 3.5–5.1)
Sodium: 136 mmol/L (ref 135–145)

## 2020-02-01 LAB — TROPONIN I (HIGH SENSITIVITY)
Troponin I (High Sensitivity): 6 ng/L (ref <18)
Troponin I (High Sensitivity): 6 ng/L (ref <18)

## 2020-02-01 LAB — SARS CORONAVIRUS 2 BY RT PCR (HOSPITAL ORDER, PERFORMED IN ~~LOC~~ HOSPITAL LAB): SARS Coronavirus 2: NEGATIVE

## 2020-02-01 LAB — BRAIN NATRIURETIC PEPTIDE: B Natriuretic Peptide: 276 pg/mL — ABNORMAL HIGH (ref 0.0–100.0)

## 2020-02-01 MED ORDER — DOXYCYCLINE HYCLATE 100 MG PO TABS
100.0000 mg | ORAL_TABLET | Freq: Two times a day (BID) | ORAL | 0 refills | Status: DC
Start: 2020-02-01 — End: 2020-10-08

## 2020-02-01 MED ORDER — PREDNISONE 50 MG PO TABS
60.0000 mg | ORAL_TABLET | Freq: Once | ORAL | Status: AC
  Administered 2020-02-01: 60 mg via ORAL
  Filled 2020-02-01: qty 1

## 2020-02-01 MED ORDER — POTASSIUM CHLORIDE 10 MEQ/100ML IV SOLN
10.0000 meq | INTRAVENOUS | Status: AC
  Administered 2020-02-01 (×2): 10 meq via INTRAVENOUS
  Filled 2020-02-01 (×2): qty 100

## 2020-02-01 MED ORDER — POTASSIUM CHLORIDE CRYS ER 20 MEQ PO TBCR
20.0000 meq | EXTENDED_RELEASE_TABLET | Freq: Two times a day (BID) | ORAL | 0 refills | Status: AC
Start: 2020-02-01 — End: ?

## 2020-02-01 MED ORDER — SODIUM CHLORIDE 0.9 % IV BOLUS
500.0000 mL | Freq: Once | INTRAVENOUS | Status: AC
  Administered 2020-02-01: 500 mL via INTRAVENOUS

## 2020-02-01 MED ORDER — POTASSIUM CHLORIDE CRYS ER 20 MEQ PO TBCR
40.0000 meq | EXTENDED_RELEASE_TABLET | Freq: Once | ORAL | Status: AC
  Administered 2020-02-01: 40 meq via ORAL
  Filled 2020-02-01: qty 2

## 2020-02-01 MED ORDER — ALBUTEROL SULFATE HFA 108 (90 BASE) MCG/ACT IN AERS
4.0000 | INHALATION_SPRAY | Freq: Once | RESPIRATORY_TRACT | Status: AC
  Administered 2020-02-01: 4 via RESPIRATORY_TRACT
  Filled 2020-02-01: qty 6.7

## 2020-02-01 MED ORDER — PREDNISONE 50 MG PO TABS: 50.0000 mg | ORAL_TABLET | Freq: Every day | ORAL | 0 refills | Status: AC

## 2020-02-01 NOTE — ED Triage Notes (Signed)
Per EMS- Pt c/o right leg swelling and shortness of breath. Hx of laryngitis recently and chronic COPD SOB.

## 2020-02-01 NOTE — ED Notes (Signed)
Date and time results received: 02/01/20 1836 (use smartphrase ".now" to insert current time)  Test: Potassium  Critical Value: 2.4  Name of Provider Notified: Dr. Tomi Bamberger   Orders Received? Or Actions Taken?: None yet

## 2020-02-01 NOTE — ED Provider Notes (Signed)
[facility] Provider Note   CSN: [id number] Arrival date & time: 02/01/20  1612     History Chief Complaint  Patient presents with  . Cough    Tiffany Lynch is a 59 y.o. female.  HPI   Patient presents ED for evaluation of leg swelling and shortness of breath.  Patient states symptoms started at least a week ago.  She has been coughing.  She also feels some hoarseness of her voice and feels congestion in her throat.  She denies any fevers or chills.  She feels like she has been wheezing.  Patient does smoke but has been smoking less than usual.  Patient also has been having some discomfort in her chest and has also noticed swelling in the legs primarily on the right side.  She denies any prior history of PE.  Patient denies any history of Covid.  She did get one of the 2 of her vaccinations but has not completed series  Past Medical History:  Diagnosis Date  . Anxiety   . Arthritis   . Bipolar 1 disorder ([place])   . Chronic back pain   . Chronic knee pain   . COPD (chronic obstructive pulmonary disease) ([place])   . Depression   . Dyspnea   . GERD (gastroesophageal reflux disease)   . History of anemia   . Hypertension   . Lumbar radiculopathy   . On home oxygen therapy "6 years ago"   3LPM PRN: "had this 6 years ago," pt no longer has O2 at home (07/01/2017)  . Sleep apnea     Patient Active Problem List   Diagnosis Date Noted  . Rib pain 08/19/2019  . Protrusion of lumbar intervertebral disc 05/20/2019  . Knee abrasion 05/20/2019  . Impingement syndrome of left shoulder 03/25/2019  . S/P total knee arthroplasty, left 02/11/2019  . Rib fractures 07/01/2017  . Tobacco dependence 07/01/2017  . Hypotension 08/14/2016  . Respiratory failure with hypoxia ([place]) 08/14/2016  . Hyperglycemia 08/14/2016  . Alcohol abuse 08/14/2016  . Cocaine abuse ([place]) 08/14/2016  . Chronic pain 08/14/2016  . Oral thrush 08/14/2016  . Hypokalemia 05/31/2016  .  Pre-syncope 05/31/2016  . Current smoker 07/20/2015  . Urge and stress incontinence 07/14/2015  . Hyperlipidemia 01/02/2015  . Insomnia 01/02/2015  . OAB (overactive bladder) 01/02/2015  . Depression 01/02/2015  . GERD (gastroesophageal reflux disease) 01/02/2015  . ARF (acute renal failure) ([place]) 12/21/2013  . COPD (chronic obstructive pulmonary disease) ([place]) 05/22/2012  . Dysfunctional uterine bleeding 04/01/2012  . Orthostatic hypotension 03/31/2012  . Encephalopathy acute 03/30/2012  . Chronic back pain   . Bipolar 1 disorder ([place])   . Anxiety     Past Surgical History:  Procedure Laterality Date  . COLONOSCOPY WITH PROPOFOL N/A 07/18/2014   Procedure: COLONOSCOPY WITH PROPOFOL;  Surgeon: Rogene Houston, MD;  Location: [facility];  Service: Endoscopy;  Laterality: N/A;  in cecum at 0753, withdrawal time 20 min  . DILATION AND CURETTAGE OF UTERUS  age 51  . KNEE ARTHROSCOPY WITH MEDIAL MENISECTOMY Left 10/10/2016   Procedure: KNEE ARTHROSCOPY WITH MEDIAL MENISECTOMY REMOVAL LOSE BODY;  Surgeon: Carole Civil, MD;  Location: [facility];  Service: Orthopedics;  Laterality: Left;  . POLYPECTOMY  05/22/2012   Procedure: POLYPECTOMY;  Surgeon: Jonnie Kind, MD;  Location: [facility];  Service: Gynecology;  Laterality: N/A;  Endometrial Polypectomy  . POLYPECTOMY N/A 07/18/2014   Procedure: POLYPECTOMY;  Surgeon: Rogene Houston, MD;  Location: [facility];  Service: Endoscopy;  Laterality: N/A;  . TOTAL KNEE ARTHROPLASTY Left 02/10/2019   Procedure: LEFT TOTAL KNEE ARTHROPLASTY-CEMENTED;  Surgeon: Marybelle Killings, MD;  Location: [facility];  Service: Orthopedics;  Laterality: Left;  . TUBAL LIGATION       OB History    Gravida  2   Para  2   Term  2   Preterm      AB      Living  2     SAB      TAB      Ectopic      Multiple      Live Births              Family History  Problem Relation Age of Onset  . Hypertension Mother   . Hypertension Brother   . Anxiety disorder  Brother   . Depression Brother     Social History   Tobacco Use  . Smoking status: Current Every Day Smoker    Packs/day: 1.00    Years: 41.00    Pack years: 41.00    Types: Cigarettes  . Smokeless tobacco: Never Used  Substance Use Topics  . Alcohol use: Not Currently    Alcohol/week: 2.0 - 3.0 standard drinks    Types: 2 - 3 Glasses of wine per week    Comment: drinks wine several times a week  . Drug use: Yes    Frequency: 3.0 times per week    Types: Marijuana    Comment: history of cocaine and marijuana use, reports no use in about 6 months    Home Medications Prior to Admission medications   Medication Sig Start Date End Date Taking? Authorizing Provider  albuterol (PROVENTIL) (2.5 MG/3ML) 0.083% nebulizer solution Take 2.5 mg by nebulization every 6 (six) hours as needed for wheezing or shortness of breath.    Yes [provider]  amLODipine (NORVASC) 10 MG tablet Take 10 mg by mouth daily. 07/02/18  Yes [provider]  buPROPion (WELLBUTRIN XL) 300 MG 24 hr tablet Take 300 mg by mouth every morning.   Yes [provider]  clonazePAM (KLONOPIN) 1 MG tablet Take 1 tablet (1 mg total) by mouth 2 (two) times daily. 02/15/19  Yes Marybelle Killings, MD  doxepin (SINEQUAN) 75 MG capsule Take 75 mg by mouth at bedtime.  05/27/18  Yes [provider]  fluconazole (DIFLUCAN) 100 MG tablet Take 100 mg by mouth daily. 14 day course starting on 01/26/2020   Yes [provider]  gabapentin (NEURONTIN) 300 MG capsule Take 300 mg by mouth 2 (two) times daily.  09/14/19  Yes [provider]  HYDROcodone-acetaminophen (NORCO/VICODIN) 5-325 MG tablet Take 1 tablet by mouth 2 (two) times daily as needed for moderate pain. 08/19/19  Yes Marybelle Killings, MD  metoprolol tartrate (LOPRESSOR) 25 MG tablet Take 25 mg by mouth 2 (two) times daily. 06/16/17  Yes [provider]  Multiple Vitamin (MULTIVITAMIN WITH MINERALS) TABS tablet Take 1 tablet  by mouth daily.   Yes [provider]  Olopatadine HCl (PAZEO) 0.7 % SOLN Place 1 drop into both eyes daily.   Yes [provider]  omeprazole (PRILOSEC) 20 MG capsule Take 20 mg by mouth daily. 07/29/16  Yes [provider]  oxybutynin (DITROPAN-XL) 10 MG 24 hr tablet Take 10 mg by mouth in the morning and at bedtime.  03/09/19  Yes [provider]  risperiDONE (RISPERDAL) 3 MG tablet  Take 3 mg by mouth at bedtime.    Yes [provider]  amoxicillin (AMOXIL) 500 MG tablet Take 3 tablets the night before procedure. Take 2 more tablets day of and prior to procedure. Patient not taking: Reported on 11/11/2019 05/25/19   Marybelle Killings, MD  busPIRone (BUSPAR) 15 MG tablet Take 15 mg by mouth 3 (three) times daily.    [provider]  doxycycline (VIBRA-TABS) 100 MG tablet Take 1 tablet (100 mg total) by mouth 2 (two) times daily. 02/01/20   Dorie Rank, MD  potassium chloride SA (KLOR-CON) 20 MEQ tablet Take 1 tablet (20 mEq total) by mouth 2 (two) times daily. 02/01/20   Dorie Rank, MD  predniSONE (DELTASONE) 50 MG tablet Take 1 tablet (50 mg total) by mouth daily. 02/01/20   Dorie Rank, MD  paliperidone (INVEGA) 6 MG 24 hr tablet Take 6 mg by mouth daily.    11/29/11  [provider]    Allergies    Patient has no known allergies.  Review of Systems   Review of Systems  All other systems reviewed and are negative.   Physical Exam Updated Vital Signs BP 93/71   Pulse 75   Temp 98.5 F (36.9 C) (Oral)   Resp 10   Ht 1.6 m (5\' 3" )   Wt 93 kg   LMP 05/06/2012   SpO2 93%   BMI 36.31 kg/m   Physical Exam Vitals and nursing note reviewed.  Constitutional:      General: She is not in acute distress.    Appearance: She is well-developed.  HENT:     Head: Normocephalic and atraumatic.     Right Ear: External ear normal.     Left Ear: External ear normal.     Mouth/Throat:     Comments: No exudate noted, able speak in full  sentences, no stridor Eyes:     General: No scleral icterus.       Right eye: No discharge.        Left eye: No discharge.     Conjunctiva/sclera: Conjunctivae normal.  Neck:     Trachea: No tracheal deviation.  Cardiovascular:     Rate and Rhythm: Normal rate and regular rhythm.  Pulmonary:     Effort: Pulmonary effort is normal. No respiratory distress.     Breath sounds: No stridor. Wheezing present. No rales.  Abdominal:     General: Bowel sounds are normal. There is no distension.     Palpations: Abdomen is soft.     Tenderness: There is no abdominal tenderness. There is no guarding or rebound.  Musculoskeletal:        General: No tenderness.     Cervical back: Neck supple.  Skin:    General: Skin is warm and dry.     Findings: No rash.  Neurological:     Mental Status: She is alert.     Cranial Nerves: No cranial nerve deficit (no facial droop, extraocular movements intact, no slurred speech).     Sensory: No sensory deficit.     Motor: No abnormal muscle tone or seizure activity.     Coordination: Coordination normal.     ED Results / Procedures / Treatments   Labs (all labs ordered are listed, but only abnormal results are displayed) Labs Reviewed  CBC WITH DIFFERENTIAL/PLATELET - Abnormal; Notable for the following components:      Result Value   WBC 11.2 (*)    RBC 3.82 (*)  Hemoglobin 11.5 (*)    All other components within normal limits  BASIC METABOLIC PANEL - Abnormal; Notable for the following components:   Potassium 2.4 (*)    Chloride 95 (*)    Glucose, Bld 144 (*)    Calcium 8.6 (*)    All other components within normal limits  BRAIN NATRIURETIC PEPTIDE - Abnormal; Notable for the following components:   B Natriuretic Peptide 276.0 (*)    All other components within normal limits  SARS CORONAVIRUS 2 BY RT PCR (HOSPITAL ORDER, [facility])  TROPONIN I (HIGH SENSITIVITY)  TROPONIN I (HIGH SENSITIVITY)    EKG EKG  Interpretation  Date/Time:  Tuesday Feb 01 2020 18:19:32 EDT Ventricular Rate:  71 PR Interval:    QRS Duration: 97 QT Interval:  439 QTC Calculation: 478 R Axis:   13 Text Interpretation: Sinus rhythm Low voltage, extremity and precordial leads No significant change since last tracing Confirmed by Dorie Rank [phone]) on 02/01/2020 6:37:10 PM   Radiology US Venous Img Lower Bilateral  Result Date: 02/01/2020 CLINICAL DATA:  Bilateral leg edema x1 week. EXAM: BILATERAL LOWER EXTREMITY VENOUS DOPPLER ULTRASOUND TECHNIQUE: Gray-scale sonography with compression, as well as color and duplex ultrasound, were performed to evaluate the deep venous system(s) from the level of the common femoral vein through the popliteal and proximal calf veins. COMPARISON:  None. FINDINGS: VENOUS Normal compressibility of the common femoral, superficial femoral, and popliteal veins, as well as the visualized calf veins. Visualized portions of profunda femoral vein and great saphenous vein unremarkable. No filling defects to suggest DVT on grayscale or color Doppler imaging. Doppler waveforms show normal direction of venous flow, normal respiratory plasticity and response to augmentation. Limited views of the contralateral common femoral vein are unremarkable. OTHER None. Limitations: none IMPRESSION: Negative. Electronically Signed   By: Virgina Norfolk M.D.   On: 02/01/2020 17:59   DG Chest Portable 1 View  Result Date: 02/01/2020 CLINICAL DATA:  Lower extremity edema, short of breath EXAM: PORTABLE CHEST 1 VIEW COMPARISON:  06/01/2019 FINDINGS: Single frontal view of the chest demonstrates an enlarged cardiac silhouette. There is increased vascular congestion and mild interstitial prominence, without airspace disease, effusion, or pneumothorax. No acute bony abnormalities. IMPRESSION: 1. Mild interstitial edema. Electronically Signed   By: Randa Ngo M.D.   On: 02/01/2020 17:27    Procedures Procedures  (including critical care time)  Medications Ordered in ED Medications  potassium chloride 10 mEq in 100 mL IVPB (10 mEq Intravenous New Bag/Given (Non-Interop) 02/01/20 2010)  albuterol (VENTOLIN HFA) 108 (90 Base) MCG/ACT inhaler 4 puff (4 puffs Inhalation Given 02/01/20 1811)  predniSONE (DELTASONE) tablet 60 mg (60 mg Oral Given 02/01/20 1811)  potassium chloride SA (KLOR-CON) CR tablet 40 mEq (40 mEq Oral Given 02/01/20 2010)  sodium chloride 0.9 % bolus 500 mL (500 mLs Intravenous New Bag/Given (Non-Interop) 02/01/20 2011)    ED Course  I have reviewed the triage vital signs and the nursing notes.  Pertinent labs & imaging results that were available during my care of the patient were reviewed by me and considered in my medical decision making (see chart for details).  Clinical Course as of Feb 01 2108  Tue Feb 01, 2020  1817 Negative for DVT  US Venous Img Lower Bilateral [JK]  [id number] Chest x-ray suggest mild interstitial edema  DG Chest Portable 1 View [JK]  1833 Hypokalemia noted.  We will replace with oral and IV potassium   [JK]  1956 First troponin normal   [JK]  2022 Covid test is negative.  BNP is slightly elevated.   [JK]  2022 Serial troponins are negative.   [JK]  2103 Patient is feeling better after treatment will still with some laryngitis.  She is talking on the phone without oxygen supplementation   [JK]    Clinical Course User Index [JK] Dorie Rank, MD   MDM Rules/Calculators/A&P                      Patient presents to ED for evaluation of shortness of breath.  Patient does have history of COPD and smoking.  Patient was noted to have slight wheezing and laryngitis.  No signs of pharyngitis or findings to suggest epiglottitis.  Patient was treated with albuterol and steroids.  Her Covid test was negative.  Chest x-ray suggest possible interstitial process and slight increase in her BNP but her picture is not consistent with CHF.  Doppler studies did not show any  evidence of DVT.  Patient's blood pressures have been low end of normal but this appears to be her baseline and no findings to suggest sepsis.  Plan on discharge home with steroids and inhalers.  Also give her a short course of antibiotics.  I do recommend outpatient follow-up with ENT considering her smoking history and persistent laryngitis. Final Clinical Impression(s) / ED Diagnoses Final diagnoses:  COPD exacerbation ([place])  Laryngitis    Rx / DC Orders ED Discharge Orders         Ordered    potassium chloride SA (KLOR-CON) 20 MEQ tablet  2 times daily     02/01/20 2108    doxycycline (VIBRA-TABS) 100 MG tablet  2 times daily     02/01/20 2108    predniSONE (DELTASONE) 50 MG tablet  Daily     02/01/20 2108           Dorie Rank, MD 02/01/20 2111

## 2020-02-01 NOTE — Discharge Instructions (Signed)
The medications as prescribed.  Follow-up with the ENT doctor for further evaluation as we discussed.  Follow-up with your primary care doctor to recheck your potassium level.

## 2020-04-11 ENCOUNTER — Other Ambulatory Visit: Payer: Self-pay

## 2020-04-11 ENCOUNTER — Encounter (HOSPITAL_COMMUNITY): Payer: Self-pay

## 2020-04-11 ENCOUNTER — Emergency Department (HOSPITAL_COMMUNITY): Payer: Medicaid Other

## 2020-04-11 ENCOUNTER — Emergency Department (HOSPITAL_COMMUNITY)
Admission: EM | Admit: 2020-04-11 | Discharge: 2020-04-11 | Disposition: A | Discharge status: Home / Self Care | Payer: Medicaid Other | Attending: Emergency Medicine | Admitting: Emergency Medicine

## 2020-04-11 DIAGNOSIS — Z96652 Presence of left artificial knee joint: Secondary | ICD-10-CM | POA: Diagnosis not present

## 2020-04-11 DIAGNOSIS — T50901A Poisoning by unspecified drugs, medicaments and biological substances, accidental (unintentional), initial encounter: Secondary | ICD-10-CM

## 2020-04-11 DIAGNOSIS — Y929 Unspecified place or not applicable: Secondary | ICD-10-CM | POA: Insufficient documentation

## 2020-04-11 DIAGNOSIS — F1910 Other psychoactive substance abuse, uncomplicated: Secondary | ICD-10-CM | POA: Diagnosis not present

## 2020-04-11 DIAGNOSIS — Y939 Activity, unspecified: Secondary | ICD-10-CM | POA: Diagnosis not present

## 2020-04-11 DIAGNOSIS — Z79899 Other long term (current) drug therapy: Secondary | ICD-10-CM | POA: Diagnosis not present

## 2020-04-11 DIAGNOSIS — Z7952 Long term (current) use of systemic steroids: Secondary | ICD-10-CM | POA: Insufficient documentation

## 2020-04-11 DIAGNOSIS — F17210 Nicotine dependence, cigarettes, uncomplicated: Secondary | ICD-10-CM | POA: Insufficient documentation

## 2020-04-11 DIAGNOSIS — I10 Essential (primary) hypertension: Secondary | ICD-10-CM | POA: Diagnosis not present

## 2020-04-11 DIAGNOSIS — Y999 Unspecified external cause status: Secondary | ICD-10-CM | POA: Diagnosis not present

## 2020-04-11 DIAGNOSIS — J449 Chronic obstructive pulmonary disease, unspecified: Secondary | ICD-10-CM | POA: Insufficient documentation

## 2020-04-11 DIAGNOSIS — X58XXXA Exposure to other specified factors, initial encounter: Secondary | ICD-10-CM | POA: Insufficient documentation

## 2020-04-11 DIAGNOSIS — T405X1A Poisoning by cocaine, accidental (unintentional), initial encounter: Secondary | ICD-10-CM | POA: Insufficient documentation

## 2020-04-11 LAB — COMPREHENSIVE METABOLIC PANEL
ALT: 11 U/L (ref 0–44)
AST: 9 U/L — ABNORMAL LOW (ref 15–41)
Albumin: 3.5 g/dL (ref 3.5–5.0)
Alkaline Phosphatase: 75 U/L (ref 38–126)
Anion gap: 11 (ref 5–15)
BUN: 19 mg/dL (ref 6–20)
CO2: 25 mmol/L (ref 22–32)
Calcium: 8.2 mg/dL — ABNORMAL LOW (ref 8.9–10.3)
Chloride: 101 mmol/L (ref 98–111)
Creatinine, Ser: 1.4 mg/dL — ABNORMAL HIGH (ref 0.44–1.00)
GFR calc Af Amer: 48 mL/min — ABNORMAL LOW (ref >60)
GFR calc non Af Amer: 41 mL/min — ABNORMAL LOW (ref >60)
Glucose, Bld: 82 mg/dL (ref 70–99)
Potassium: 3.5 mmol/L (ref 3.5–5.1)
Sodium: 137 mmol/L (ref 135–145)
Total Bilirubin: 0.5 mg/dL (ref 0.3–1.2)
Total Protein: 6.2 g/dL — ABNORMAL LOW (ref 6.5–8.1)

## 2020-04-11 LAB — RAPID URINE DRUG SCREEN, HOSP PERFORMED
Amphetamines: NOT DETECTED
Barbiturates: NOT DETECTED
Benzodiazepines: NOT DETECTED
Cocaine: POSITIVE — AB
Opiates: POSITIVE — AB
Tetrahydrocannabinol: POSITIVE — AB

## 2020-04-11 LAB — CBC WITH DIFFERENTIAL/PLATELET
Abs Immature Granulocytes: 0.06 10*3/uL (ref 0.00–0.07)
Basophils Absolute: 0 10*3/uL (ref 0.0–0.1)
Basophils Relative: 0 %
Eosinophils Absolute: 0.1 10*3/uL (ref 0.0–0.5)
Eosinophils Relative: 1 %
HCT: 39.3 % (ref 36.0–46.0)
Hemoglobin: 12.4 g/dL (ref 12.0–15.0)
Immature Granulocytes: 0 %
Lymphocytes Relative: 15 %
Lymphs Abs: 2.1 10*3/uL (ref 0.7–4.0)
MCH: 29.9 pg (ref 26.0–34.0)
MCHC: 31.6 g/dL (ref 30.0–36.0)
MCV: 94.7 fL (ref 80.0–100.0)
Monocytes Absolute: 1 10*3/uL (ref 0.1–1.0)
Monocytes Relative: 7 %
Neutro Abs: 10.6 10*3/uL — ABNORMAL HIGH (ref 1.7–7.7)
Neutrophils Relative %: 77 %
Platelets: 287 10*3/uL (ref 150–400)
RBC: 4.15 MIL/uL (ref 3.87–5.11)
RDW: 14.5 % (ref 11.5–15.5)
WBC: 13.9 10*3/uL — ABNORMAL HIGH (ref 4.0–10.5)
nRBC: 0 % (ref 0.0–0.2)

## 2020-04-11 LAB — SALICYLATE LEVEL: Salicylate Lvl: <7 mg/dL — ABNORMAL LOW (ref 7.0–30.0)

## 2020-04-11 LAB — ETHANOL: Alcohol, Ethyl (B): <10 mg/dL (ref <10)

## 2020-04-11 LAB — CBG MONITORING, ED: Glucose-Capillary: 89 mg/dL (ref 70–99)

## 2020-04-11 LAB — ACETAMINOPHEN LEVEL: Acetaminophen (Tylenol), Serum: <10 ug/mL — ABNORMAL LOW (ref 10–30)

## 2020-04-11 MED ORDER — SODIUM CHLORIDE 0.9 % IV BOLUS
1000.0000 mL | Freq: Once | INTRAVENOUS | Status: AC
  Administered 2020-04-11: 1000 mL via INTRAVENOUS

## 2020-04-11 MED ORDER — NALOXONE HCL 0.4 MG/ML IJ SOLN
0.4000 mg | Freq: Once | INTRAMUSCULAR | Status: AC
  Administered 2020-04-11: 0.4 mg via INTRAVENOUS
  Filled 2020-04-11: qty 1

## 2020-04-11 NOTE — ED Notes (Signed)
[phone] Colletta [place] (daughter)

## 2020-04-11 NOTE — ED Notes (Signed)
Pt. Ambulated 100 ft. In hallway.

## 2020-04-11 NOTE — Discharge Instructions (Signed)
Your lab tests today show that you have cocaine, narcotics and marijuana in your bloodstream, all which in combination can cause severe injury and death.  You are having difficulty breathing when you first arrived here and are lucky that you have recovered from this overdose.  You need to be very careful about the medications you take in addition to other recreational drugs.  I have given you some information about outpatient clinics if you decide you want help with your substance abuse.  You can also return here if you have any new or worsening symptoms.

## 2020-04-11 NOTE — ED Notes (Signed)
Pt. Was sitting at the end of their bed. Pt. Ripped their IV out and stated " I am going home". When informing pt. That they didn't have a ride home pt. Stated "so?".

## 2020-04-11 NOTE — ED Notes (Signed)
Patient transported to CT 

## 2020-04-11 NOTE — ED Provider Notes (Signed)
[facility] Provider Note   CSN: [id number] Arrival date & time: 04/11/20  1123     History Chief Complaint  Patient presents with  . Drug Overdose    Tiffany Lynch is a 59 y.o. female bipolar disorder, COPD, GERD, depression, hypertension and chronic back and knee pain, also history of EtOH and cocaine abuse presenting with altered mental status.  She presents by EMS who indicated that the patient's roommate suspects she may have taken too much Klonopin, also notes that she takes liquid Benadryl as well but is unclear if these were the ingestions.  She was given Narcan 4 mg prior to arrival but without response.  The history is provided by the EMS personnel.       Past Medical History:  Diagnosis Date  . Anxiety   . Arthritis   . Bipolar 1 disorder ([place])   . Chronic back pain   . Chronic knee pain   . COPD (chronic obstructive pulmonary disease) ([place])   . Depression   . Dyspnea   . GERD (gastroesophageal reflux disease)   . History of anemia   . Hypertension   . Lumbar radiculopathy   . On home oxygen therapy "6 years ago"   3LPM PRN: "had this 6 years ago," pt no longer has O2 at home (07/01/2017)  . Sleep apnea     Patient Active Problem List   Diagnosis Date Noted  . Rib pain 08/19/2019  . Protrusion of lumbar intervertebral disc 05/20/2019  . Knee abrasion 05/20/2019  . Impingement syndrome of left shoulder 03/25/2019  . S/P total knee arthroplasty, left 02/11/2019  . Rib fractures 07/01/2017  . Tobacco dependence 07/01/2017  . Hypotension 08/14/2016  . Respiratory failure with hypoxia ([place]) 08/14/2016  . Hyperglycemia 08/14/2016  . Alcohol abuse 08/14/2016  . Cocaine abuse ([place]) 08/14/2016  . Chronic pain 08/14/2016  . Oral thrush 08/14/2016  . Hypokalemia 05/31/2016  . Pre-syncope 05/31/2016  . Current smoker 07/20/2015  . Urge and stress incontinence 07/14/2015  . Hyperlipidemia 01/02/2015  . Insomnia 01/02/2015  . OAB  (overactive bladder) 01/02/2015  . Depression 01/02/2015  . GERD (gastroesophageal reflux disease) 01/02/2015  . ARF (acute renal failure) ([place]) 12/21/2013  . COPD (chronic obstructive pulmonary disease) ([facility]) 05/22/2012  . Dysfunctional uterine bleeding 04/01/2012  . Orthostatic hypotension 03/31/2012  . Encephalopathy acute 03/30/2012  . Chronic back pain   . Bipolar 1 disorder ([place])   . Anxiety     Past Surgical History:  Procedure Laterality Date  . COLONOSCOPY WITH PROPOFOL N/A 07/18/2014   Procedure: COLONOSCOPY WITH PROPOFOL;  Surgeon: Rogene Houston, MD;  Location: [facility];  Service: Endoscopy;  Laterality: N/A;  in cecum at 0753, withdrawal time 20 min  . DILATION AND CURETTAGE OF UTERUS  age 67  . KNEE ARTHROSCOPY WITH MEDIAL MENISECTOMY Left 10/10/2016   Procedure: KNEE ARTHROSCOPY WITH MEDIAL MENISECTOMY REMOVAL LOSE BODY;  Surgeon: Carole Civil, MD;  Location: [facility];  Service: Orthopedics;  Laterality: Left;  . POLYPECTOMY  05/22/2012   Procedure: POLYPECTOMY;  Surgeon: Jonnie Kind, MD;  Location: [facility];  Service: Gynecology;  Laterality: N/A;  Endometrial Polypectomy  . POLYPECTOMY N/A 07/18/2014   Procedure: POLYPECTOMY;  Surgeon: Rogene Houston, MD;  Location: [facility];  Service: Endoscopy;  Laterality: N/A;  . TOTAL KNEE ARTHROPLASTY Left 02/10/2019   Procedure: LEFT TOTAL KNEE ARTHROPLASTY-CEMENTED;  Surgeon: Marybelle Killings, MD;  Location: [facility];  Service: Orthopedics;  Laterality: Left;  .  TUBAL LIGATION       OB History    Gravida  2   Para  2   Term  2   Preterm      AB      Living  2     SAB      TAB      Ectopic      Multiple      Live Births              Family History  Problem Relation Age of Onset  . Hypertension Mother   . Hypertension Brother   . Anxiety disorder Brother   . Depression Brother     Social History   Tobacco Use  . Smoking status: Current Every Day Smoker    Packs/day: 1.00    Years: 41.00    Pack  years: 41.00    Types: Cigarettes  . Smokeless tobacco: Never Used  Vaping Use  . Vaping Use: Never used  Substance Use Topics  . Alcohol use: Yes    Comment: unable to state  . Drug use: Yes    Frequency: 3.0 times per week    Types: Marijuana    Comment: history of cocaine and marijuana use, reports no use in about 6 months    Home Medications Prior to Admission medications   Medication Sig Start Date End Date Taking? Authorizing Provider  albuterol (PROVENTIL) (2.5 MG/3ML) 0.083% nebulizer solution Take 2.5 mg by nebulization every 6 (six) hours as needed for wheezing or shortness of breath.     [provider]  amLODipine (NORVASC) 10 MG tablet Take 10 mg by mouth daily. 07/02/18   [provider]  amoxicillin (AMOXIL) 500 MG tablet Take 3 tablets the night before procedure. Take 2 more tablets day of and prior to procedure. Patient not taking: Reported on 11/11/2019 05/25/19   Marybelle Killings, MD  buPROPion (WELLBUTRIN XL) 300 MG 24 hr tablet Take 300 mg by mouth every morning.    [provider]  busPIRone (BUSPAR) 15 MG tablet Take 15 mg by mouth 3 (three) times daily.    [provider]  clonazePAM (KLONOPIN) 1 MG tablet Take 1 tablet (1 mg total) by mouth 2 (two) times daily. 02/15/19   Marybelle Killings, MD  doxepin (SINEQUAN) 75 MG capsule Take 75 mg by mouth at bedtime.  05/27/18   [provider]  doxycycline (VIBRA-TABS) 100 MG tablet Take 1 tablet (100 mg total) by mouth 2 (two) times daily. 02/01/20   Dorie Rank, MD  fluconazole (DIFLUCAN) 100 MG tablet Take 100 mg by mouth daily. 14 day course starting on 01/26/2020    [provider]  gabapentin (NEURONTIN) 300 MG capsule Take 300 mg by mouth 2 (two) times daily.  09/14/19   [provider]  HYDROcodone-acetaminophen (NORCO/VICODIN) 5-325 MG tablet Take 1 tablet by mouth 2 (two) times daily as needed for moderate pain. 08/19/19   Marybelle Killings, MD  metoprolol tartrate  (LOPRESSOR) 25 MG tablet Take 25 mg by mouth 2 (two) times daily. 06/16/17   [provider]  Multiple Vitamin (MULTIVITAMIN WITH MINERALS) TABS tablet Take 1 tablet by mouth daily.    [provider]  Olopatadine HCl (PAZEO) 0.7 % SOLN Place 1 drop into both eyes daily.    [provider]  omeprazole (PRILOSEC) 20 MG capsule Take 20 mg by mouth daily. 07/29/16   [provider]  oxybutynin (DITROPAN-XL) 10 MG 24 hr tablet  Take 10 mg by mouth in the morning and at bedtime.  03/09/19   [provider]  potassium chloride SA (KLOR-CON) 20 MEQ tablet Take 1 tablet (20 mEq total) by mouth 2 (two) times daily. 02/01/20   Dorie Rank, MD  predniSONE (DELTASONE) 50 MG tablet Take 1 tablet (50 mg total) by mouth daily. 02/01/20   Dorie Rank, MD  risperiDONE (RISPERDAL) 3 MG tablet Take 3 mg by mouth at bedtime.     [provider]  paliperidone (INVEGA) 6 MG 24 hr tablet Take 6 mg by mouth daily.    11/29/11  [provider]    Allergies    Patient has no known allergies.  Review of Systems   Review of Systems  Unable to perform ROS: Patient unresponsive (Patient wakes verbal stimuli, but quickly falls back asleep.  Mumbling, difficult to understand her speech.)    Physical Exam Updated Vital Signs BP 103/65   Pulse 68   Temp 97.9 F (36.6 C) (Oral)   Resp 14   Wt 97.5 kg   LMP 05/06/2012   SpO2 90%   BMI 38.07 kg/m   Physical Exam Vitals and nursing note reviewed.  Constitutional:      General: She is sleeping.     Appearance: She is well-developed.     Interventions: Nasal cannula in place.  HENT:     Head: Normocephalic and atraumatic.  Eyes:     Conjunctiva/sclera: Conjunctivae normal.     Pupils: Pupils are equal, round, and reactive to light.     Right eye: Pupil is sluggish.     Left eye: Pupil is sluggish.     Comments: 4 mm pupils, sluggishly reactive.  Cardiovascular:     Rate and Rhythm: Normal rate and regular  rhythm.     Heart sounds: Normal heart sounds.  Pulmonary:     Effort: Pulmonary effort is normal.     Breath sounds: Normal breath sounds. No wheezing.  Abdominal:     General: Bowel sounds are normal.     Palpations: Abdomen is soft.     Tenderness: There is no abdominal tenderness.  Musculoskeletal:        General: Normal range of motion.     Cervical back: Normal range of motion.  Skin:    General: Skin is warm and dry.  Neurological:     Mental Status: She is lethargic.     ED Results / Procedures / Treatments   Labs (all labs ordered are listed, but only abnormal results are displayed) Labs Reviewed  COMPREHENSIVE METABOLIC PANEL - Abnormal; Notable for the following components:      Result Value   Creatinine, Ser 1.40 (*)    Calcium 8.2 (*)    Total Protein 6.2 (*)    AST 9 (*)    GFR calc non Af Amer 41 (*)    GFR calc Af Amer 48 (*)    All other components within normal limits  SALICYLATE LEVEL - Abnormal; Notable for the following components:   Salicylate Lvl <6.8 (*)    All other components within normal limits  ACETAMINOPHEN LEVEL - Abnormal; Notable for the following components:   Acetaminophen (Tylenol), Serum <10 (*)    All other components within normal limits  RAPID URINE DRUG SCREEN, HOSP PERFORMED - Abnormal; Notable for the following components:   Opiates POSITIVE (*)    Cocaine POSITIVE (*)    Tetrahydrocannabinol POSITIVE (*)    All other components within normal  limits  CBC WITH DIFFERENTIAL/PLATELET - Abnormal; Notable for the following components:   WBC 13.9 (*)    Neutro Abs 10.6 (*)    All other components within normal limits  ETHANOL  CBG MONITORING, ED  CBG MONITORING, ED    EKG EKG Interpretation  Date/Time:  Tuesday April 11 2020 11:47:12 EDT Ventricular Rate:  57 PR Interval:    QRS Duration: 108 QT Interval:  440 QTC Calculation: 429 R Axis:   56 Text Interpretation: Sinus rhythm Low voltage, precordial leads since last  tracing no significant change Confirmed by Daleen Bo [phone]) on 04/11/2020 1:14:19 PM   Radiology CT Head Wo Contrast  Result Date: 04/11/2020 CLINICAL DATA:  Altered mental status. EXAM: CT HEAD WITHOUT CONTRAST TECHNIQUE: Contiguous axial images were obtained from the base of the skull through the vertex without intravenous contrast. COMPARISON:  March 30, 2012. FINDINGS: Brain: No evidence of acute infarction, hemorrhage, hydrocephalus, extra-axial collection or mass lesion/mass effect. Vascular: No hyperdense vessel or unexpected calcification. Skull: Normal. Negative for fracture or focal lesion. Sinuses/Orbits: Left maxillary sinusitis is noted. Other: None. IMPRESSION: Left maxillary sinusitis. No acute intracranial abnormality seen. Electronically Signed   By: Marijo Conception M.D.   On: 04/11/2020 14:40    Procedures Procedures (including critical care time)  Medications Ordered in ED Medications  sodium chloride 0.9 % bolus 1,000 mL (0 mLs Intravenous Stopped 04/11/20 1323)  sodium chloride 0.9 % bolus 1,000 mL (1,000 mLs Intravenous New Bag/Given 04/11/20 1439)  naloxone (NARCAN) injection 0.4 mg (0.4 mg Intravenous Given 04/11/20 1411)    ED Course  I have reviewed the triage vital signs and the nursing notes.  Pertinent labs & imaging results that were available during my care of the patient were reviewed by me and considered in my medical decision making (see chart for details).    MDM Rules/Calculators/A&P                          Patient with polysubstance abuse.  6:56 PM At this time patient is more awake, still drowsy but can interact and is aware of her surroundings.  She reports that she went out with friends last night, however she is unaware of any crack or cocaine ingestion, but does endorse marijuana.  She is oriented to person and place.  She did give permission for me to speak with her mother who is her contact.  I spoke with Malissa Hippo, mother who states she  believes she takes too much medication.  She confirms dg does not have hx of SI/HI.  She advised to contact her roommate Foye Deer or her daughter Colletta [place] both of whom lives in her home when she is ready to go home.    Pt was given PO fluids and tolerated well.  Her blood pressure is improved, more awake and alert, does not appear to be significantly altered at this time.  Discussed lab findings and pt denies any suicidal ideation.  She denies cocaine ingestion, but does endorse smoking marijuana.  She is prescribed hydrocodone for her chronic pain syndrome. She was felt safe for dc home with no SI/HI, no hypoxia, no hypotension.  Will ambulate prior to dc.   Final Clinical Impression(s) / ED Diagnoses Final diagnoses:  Polysubstance abuse ([place])  Acute drug overdose, accidental or unintentional, initial encounter    Rx / DC Orders ED Discharge Orders    None       Eloina Ergle, Almyra Free,  PA-C 04/11/20 1859    Daleen Bo, MD 04/12/20 [phone]

## 2020-04-11 NOTE — ED Notes (Signed)
Pt. Sitting on hallway bed with Normal saline tubing pulled out from bag. Pt. Taken back to bed and asked to not come out of room until discharge.

## 2020-04-11 NOTE — ED Notes (Signed)
Pt. Tolerating fluids. 

## 2020-04-11 NOTE — ED Triage Notes (Addendum)
Pt brought in by EMS due to potential overdose. Pt found to be altered, at times words slurred. But can answer direct questions. Potentially took more klonopin and per live in takes liquid benadryl. Systolic in the 42'A. Pt given narcan 4 mg intranasal

## 2020-07-06 ENCOUNTER — Other Ambulatory Visit: Payer: Self-pay

## 2020-07-06 ENCOUNTER — Ambulatory Visit: Payer: Medicaid Other | Admitting: Orthopaedic Surgery

## 2020-07-10 ENCOUNTER — Other Ambulatory Visit (HOSPITAL_COMMUNITY): Payer: Self-pay | Admitting: Internal Medicine

## 2020-07-10 DIAGNOSIS — Z1231 Encounter for screening mammogram for malignant neoplasm of breast: Secondary | ICD-10-CM

## 2020-07-20 ENCOUNTER — Ambulatory Visit: Payer: Medicaid Other | Admitting: Orthopaedic Surgery

## 2020-07-20 ENCOUNTER — Other Ambulatory Visit: Payer: Self-pay

## 2020-08-10 ENCOUNTER — Other Ambulatory Visit: Payer: Self-pay

## 2020-08-10 ENCOUNTER — Ambulatory Visit: Payer: Medicaid Other | Admitting: Orthopaedic Surgery

## 2020-10-08 ENCOUNTER — Emergency Department (HOSPITAL_COMMUNITY): Payer: Medicaid Other

## 2020-10-08 ENCOUNTER — Emergency Department (HOSPITAL_COMMUNITY)
Admission: EM | Admit: 2020-10-08 | Discharge: 2020-10-08 | Disposition: A | Discharge status: Home / Self Care | Payer: Medicaid Other | Attending: Emergency Medicine | Admitting: Emergency Medicine

## 2020-10-08 ENCOUNTER — Other Ambulatory Visit: Payer: Self-pay

## 2020-10-08 ENCOUNTER — Encounter (HOSPITAL_COMMUNITY): Payer: Self-pay | Admitting: Emergency Medicine

## 2020-10-08 DIAGNOSIS — J449 Chronic obstructive pulmonary disease, unspecified: Secondary | ICD-10-CM | POA: Insufficient documentation

## 2020-10-08 DIAGNOSIS — L750 Bromhidrosis: Secondary | ICD-10-CM | POA: Diagnosis not present

## 2020-10-08 DIAGNOSIS — Z79899 Other long term (current) drug therapy: Secondary | ICD-10-CM | POA: Diagnosis not present

## 2020-10-08 DIAGNOSIS — M791 Myalgia, unspecified site: Secondary | ICD-10-CM | POA: Diagnosis not present

## 2020-10-08 DIAGNOSIS — R059 Cough, unspecified: Secondary | ICD-10-CM | POA: Diagnosis present

## 2020-10-08 DIAGNOSIS — J189 Pneumonia, unspecified organism: Secondary | ICD-10-CM | POA: Diagnosis not present

## 2020-10-08 DIAGNOSIS — Z20822 Contact with and (suspected) exposure to covid-19: Secondary | ICD-10-CM | POA: Diagnosis not present

## 2020-10-08 DIAGNOSIS — R197 Diarrhea, unspecified: Secondary | ICD-10-CM | POA: Insufficient documentation

## 2020-10-08 DIAGNOSIS — H9203 Otalgia, bilateral: Secondary | ICD-10-CM | POA: Diagnosis not present

## 2020-10-08 DIAGNOSIS — F17210 Nicotine dependence, cigarettes, uncomplicated: Secondary | ICD-10-CM | POA: Insufficient documentation

## 2020-10-08 DIAGNOSIS — I10 Essential (primary) hypertension: Secondary | ICD-10-CM | POA: Insufficient documentation

## 2020-10-08 DIAGNOSIS — Z96652 Presence of left artificial knee joint: Secondary | ICD-10-CM | POA: Insufficient documentation

## 2020-10-08 MED ORDER — BENZONATATE 100 MG PO CAPS: 100.0000 mg | ORAL_CAPSULE | Freq: Three times a day (TID) | ORAL | 0 refills | Status: AC

## 2020-10-08 MED ORDER — DOXYCYCLINE HYCLATE 100 MG PO CAPS
100.0000 mg | ORAL_CAPSULE | Freq: Two times a day (BID) | ORAL | 0 refills | Status: AC
Start: 2020-10-08 — End: 2020-10-15

## 2020-10-08 NOTE — Discharge Instructions (Signed)
You were given a prescription for antibiotics. Please take the antibiotic prescription fully.   You covid test result with be available tomorrow. You can follow up on the result in your mychart.   If the results is positive, You should be isolated for at least 7 days since the onset of your symptoms AND >72 hours after symptoms resolution (absence of fever without the use of fever reducing medicaiton and improvement in respiratory symptoms), whichever is longer  Please follow up with your primary care provider within 5-7 days for re-evaluation of your symptoms. If you do not have a primary care provider, information for a healthcare clinic has been provided for you to make arrangements for follow up care. Please return to the emergency department for any new or worsening symptoms.

## 2020-10-08 NOTE — ED Triage Notes (Signed)
Pt to the ED via [facility] complaining of a sore throat, weakness, insomnia, and a cough.  She states she does not know if she has the flu or Covid.  EMS states she is simply here for a covid test so she can go to her doctor for pain meds.
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
## 2020-10-08 NOTE — ED Notes (Signed)
Pt 98-99 on RA while ambulating. Denies SOB.

## 2020-10-09 LAB — SARS CORONAVIRUS 2 (TAT 6-24 HRS): SARS Coronavirus 2: NEGATIVE

## 2021-02-01 NOTE — Telephone Encounter (Signed)
error 

## 2021-03-01 ENCOUNTER — Ambulatory Visit: Payer: Medicaid Other | Admitting: Orthopaedic Surgery

## 2021-03-08 ENCOUNTER — Ambulatory Visit: Payer: Medicaid Other | Admitting: Orthopaedic Surgery

## 2021-03-15 ENCOUNTER — Ambulatory Visit: Payer: Medicaid Other | Admitting: Orthopaedic Surgery

## 2021-03-22 ENCOUNTER — Ambulatory Visit (INDEPENDENT_AMBULATORY_CARE_PROVIDER_SITE_OTHER): Payer: Medicaid Other | Admitting: Orthopaedic Surgery

## 2021-03-22 ENCOUNTER — Other Ambulatory Visit: Payer: Self-pay

## 2021-03-22 DIAGNOSIS — G8929 Other chronic pain: Secondary | ICD-10-CM

## 2021-03-22 DIAGNOSIS — M25512 Pain in left shoulder: Secondary | ICD-10-CM | POA: Insufficient documentation

## 2021-03-22 DIAGNOSIS — R600 Localized edema: Secondary | ICD-10-CM | POA: Diagnosis not present

## 2021-03-22 DIAGNOSIS — M19012 Primary osteoarthritis, left shoulder: Secondary | ICD-10-CM

## 2021-03-22 MED ORDER — BUPIVACAINE HCL 0.25 % IJ SOLN
4.0000 mL | INTRAMUSCULAR | Status: AC | PRN
  Administered 2021-03-22: 4 mL via INTRA_ARTICULAR

## 2021-03-22 MED ORDER — METHYLPREDNISOLONE ACETATE 40 MG/ML IJ SUSP
40.0000 mg | INTRAMUSCULAR | Status: AC | PRN
  Administered 2021-03-22: 40 mg via INTRA_ARTICULAR

## 2021-03-22 MED ORDER — LIDOCAINE HCL 1 % IJ SOLN
0.5000 mL | INTRAMUSCULAR | Status: AC | PRN
  Administered 2021-03-22: .5 mL

## 2021-03-22 NOTE — Progress Notes (Signed)
Office Visit Note   Patient: Tiffany Lynch           Date of Birth: [birth year]           MRN: [id number] Visit Date: 03/22/2021              Requested by: Adaline Sill, NP [address] [place],  [place] [address] PCP: Adaline Sill, NP   Assessment & Plan: Visit Diagnoses:  1. Primary osteoarthritis of left shoulder   2. Bilateral lower extremity edema   3. Chronic left shoulder pain     Plan: Left subacromial injection performed.  She requested narcotic pain medication and I discussed Therm unable to provide this.  She has a long history of prescription narcotic use.PDMP reviewed.   Follow-Up Instructions: No follow-ups on file.   Orders:  Orders Placed This Encounter  Procedures   Large Joint Inj: L subacromial bursa   No orders of the defined types were placed in this encounter.     Procedures: Large Joint Inj: L subacromial bursa on 03/22/2021 1:46 PM Indications: pain Details: 22 G 1.5 in needle  Arthrogram: No  Medications: 4 mL bupivacaine 0.25 %; 40 mg methylPREDNISolone acetate 40 MG/ML; 0.5 mL lidocaine 1 % Outcome: tolerated well, no immediate complications Procedure, treatment alternatives, risks and benefits explained, specific risks discussed. Consent was given by the patient. Immediately prior to procedure a time out was called to verify the correct patient, procedure, equipment, support staff and site/side marked as required. Patient was prepped and draped in the usual sterile fashion.      Clinical Data: No additional findings.   Subjective: No chief complaint on file.   HPI 60 year old female returns states she is having increased problems with her left shoulder.  Previous MRI done in [place] showed some osteoarthritis of her left shoulder.  States she also broke her toe and has been in the hospital at [facility] she states for 2 to 3 weeks.  She had cellulitis in her lower extremity and has pitting edema both legs with  multiple areas that are healing either due to previous trauma or possible bug bite.  Patient states her pain clinic that she is to go to in [place] is closed.  She has both but another pain clinic she missed that since she was in the hospital.  She states she has been depressed since her boyfriend died 6 months ago of cancer.  Patient is requesting narcotic medication as she has multiple times in the past.  She is also requesting an injection in her shoulder.  Review of Systems updated unchanged.   Objective: Vital Signs: LMP 05/06/2012   Physical Exam Constitutional:      Appearance: She is well-developed.  HENT:     Head: Normocephalic.     Right Ear: External ear normal.     Left Ear: External ear normal. There is no impacted cerumen.  Eyes:     Pupils: Pupils are equal, round, and reactive to light.  Neck:     Thyroid: No thyromegaly.     Trachea: No tracheal deviation.  Cardiovascular:     Rate and Rhythm: Normal rate.  Pulmonary:     Effort: Pulmonary effort is normal.  Abdominal:     Palpations: Abdomen is soft.  Musculoskeletal:     Cervical back: No rigidity.  Skin:    General: Skin is warm and dry.  Neurological:     Mental Status: She is alert  and oriented to person, place, and time.  Psychiatric:        Behavior: Behavior normal.    Ortho Exam patient get her left hand top of her head has more problems with further abduction.  Some crepitus with shoulder range of motion.  Sensation the hand is intact good cervical range of motion.  Pitting edema both lower extremities.  She complains of pain anteriorly over the left foot.  No dorsal tenosynovitis.  Pulses are normal.  Specialty Comments:  No specialty comments available.  Imaging: No results found.   PMFS History: Patient Active Problem List   Diagnosis Date Noted   Bilateral lower extremity edema 03/22/2021   Pain in left shoulder 03/22/2021   Rib pain 08/19/2019   Protrusion of lumbar  intervertebral disc 05/20/2019   Knee abrasion 05/20/2019   Impingement syndrome of left shoulder 03/25/2019   S/P total knee arthroplasty, left 02/11/2019   Rib fractures 07/01/2017   Tobacco dependence 07/01/2017   Hypotension 08/14/2016   Respiratory failure with hypoxia ([facility]) 08/14/2016   Hyperglycemia 08/14/2016   Alcohol abuse 08/14/2016   Cocaine abuse ([place]) 08/14/2016   Chronic pain 08/14/2016   Oral thrush 08/14/2016   Hypokalemia 05/31/2016   Pre-syncope 05/31/2016   Current smoker 07/20/2015   Urge and stress incontinence 07/14/2015   Hyperlipidemia 01/02/2015   Insomnia 01/02/2015   OAB (overactive bladder) 01/02/2015   Depression 01/02/2015   GERD (gastroesophageal reflux disease) 01/02/2015   ARF (acute renal failure) ([place]) 12/21/2013   COPD (chronic obstructive pulmonary disease) ([place]) 05/22/2012   Dysfunctional uterine bleeding 04/01/2012   Orthostatic hypotension 03/31/2012   Encephalopathy acute 03/30/2012   Chronic back pain    Bipolar 1 disorder ([place])    Anxiety    Past Medical History:  Diagnosis Date   Anxiety    Arthritis    Bipolar 1 disorder ([facility])    Chronic back pain    Chronic knee pain    COPD (chronic obstructive pulmonary disease) ([facility])    Depression    Dyspnea    GERD (gastroesophageal reflux disease)    History of anemia    Hypertension    Lumbar radiculopathy    On home oxygen therapy "6 years ago"   3LPM PRN: "had this 6 years ago," pt no longer has O2 at home (07/01/2017)   Sleep apnea     Family History  Problem Relation Age of Onset   Hypertension Mother    Hypertension Brother    Anxiety disorder Brother    Depression Brother     Past Surgical History:  Procedure Laterality Date   COLONOSCOPY WITH PROPOFOL N/A 07/18/2014   Procedure: COLONOSCOPY WITH PROPOFOL;  Surgeon: Rogene Houston, MD;  Location: [facility];  Service: Endoscopy;  Laterality: N/A;  in cecum at 0753, withdrawal time 20 min   DILATION AND CURETTAGE OF  UTERUS  age 66   KNEE ARTHROSCOPY WITH MEDIAL MENISECTOMY Left 10/10/2016   Procedure: KNEE ARTHROSCOPY WITH MEDIAL MENISECTOMY REMOVAL LOSE BODY;  Surgeon: Carole Civil, MD;  Location: [facility];  Service: Orthopedics;  Laterality: Left;   POLYPECTOMY  05/22/2012   Procedure: POLYPECTOMY;  Surgeon: Jonnie Kind, MD;  Location: [facility];  Service: Gynecology;  Laterality: N/A;  Endometrial Polypectomy   POLYPECTOMY N/A 07/18/2014   Procedure: POLYPECTOMY;  Surgeon: Rogene Houston, MD;  Location: [facility];  Service: Endoscopy;  Laterality: N/A;   TOTAL KNEE ARTHROPLASTY Left 02/10/2019   Procedure: LEFT TOTAL KNEE ARTHROPLASTY-CEMENTED;  Surgeon:  Marybelle Killings, MD;  Location: [place];  Service: Orthopedics;  Laterality: Left;   TUBAL LIGATION     Social History   Occupational History   Occupation: disabled  Tobacco Use   Smoking status: Every Day    Packs/day: 1.00    Years: 41.00    Pack years: 41.00    Types: Cigarettes   Smokeless tobacco: Never  Vaping Use   Vaping Use: Never used  Substance and Sexual Activity   Alcohol use: Yes    Comment: unable to state   Drug use: Yes    Frequency: 3.0 times per week    Types: Marijuana    Comment: history of cocaine and marijuana use, reports no use in about 6 months   Sexual activity: Yes    Birth control/protection: Surgical    Comment: tubes tied

## 2021-07-05 ENCOUNTER — Ambulatory Visit: Payer: Medicaid Other | Admitting: Orthopaedic Surgery

## 2021-07-05 ENCOUNTER — Other Ambulatory Visit: Payer: Self-pay

## 2021-08-09 ENCOUNTER — Ambulatory Visit: Payer: Medicaid Other | Admitting: Orthopaedic Surgery

## 2021-08-09 ENCOUNTER — Other Ambulatory Visit: Payer: Self-pay

## 2021-08-28 ENCOUNTER — Other Ambulatory Visit: Payer: Self-pay | Admitting: Nurse Practitioner

## 2021-08-28 DIAGNOSIS — M545 Low back pain, unspecified: Secondary | ICD-10-CM

## 2021-08-28 DIAGNOSIS — M5417 Radiculopathy, lumbosacral region: Secondary | ICD-10-CM

## 2021-09-18 ENCOUNTER — Ambulatory Visit
Admission: RE | Admit: 2021-09-18 | Discharge: 2021-09-18 | Disposition: A | Discharge status: Home / Self Care | Payer: Medicaid Other | Source: Ambulatory Visit | Attending: Nurse Practitioner | Admitting: Nurse Practitioner

## 2021-09-18 DIAGNOSIS — M545 Low back pain, unspecified: Secondary | ICD-10-CM

## 2021-09-18 DIAGNOSIS — M5417 Radiculopathy, lumbosacral region: Secondary | ICD-10-CM

## 2022-03-04 ENCOUNTER — Ambulatory Visit (INDEPENDENT_AMBULATORY_CARE_PROVIDER_SITE_OTHER): Payer: Medicaid Other

## 2022-03-04 ENCOUNTER — Ambulatory Visit (INDEPENDENT_AMBULATORY_CARE_PROVIDER_SITE_OTHER): Payer: Medicaid Other | Admitting: Podiatry

## 2022-03-04 DIAGNOSIS — M778 Other enthesopathies, not elsewhere classified: Secondary | ICD-10-CM

## 2022-03-04 DIAGNOSIS — M7752 Other enthesopathy of left foot: Secondary | ICD-10-CM | POA: Diagnosis not present

## 2022-03-04 DIAGNOSIS — M79672 Pain in left foot: Secondary | ICD-10-CM

## 2022-03-04 DIAGNOSIS — M21612 Bunion of left foot: Secondary | ICD-10-CM

## 2022-03-04 DIAGNOSIS — M21619 Bunion of unspecified foot: Secondary | ICD-10-CM

## 2022-03-04 MED ORDER — TRIAMCINOLONE ACETONIDE 10 MG/ML IJ SUSP
10.0000 mg | Freq: Once | INTRAMUSCULAR | Status: AC
  Administered 2022-03-04: 10 mg

## 2022-03-10 NOTE — Progress Notes (Signed)
Subjective:   Patient ID: Tiffany Lynch, female   DOB: 61 y.o.   MRN: [id number]   HPI 61 year old female presents the office today for concerns of painful bunion on her left foot recently worse over the last month.  Describes pain as 7/10 at maximum describes a aching sensation.  She gets pain diffusely throughout metatarsal head areas but most of this has been on the first MPJ.  No recent injury that she reports but she does have a remote history of a hairline fracture of her left foot she reports.  No recent treatment.  No other concerns.   Review of Systems  All other systems reviewed and are negative.  Past Medical History:  Diagnosis Date   Anxiety    Arthritis    Bipolar 1 disorder (HCC)    Chronic back pain    Chronic knee pain    COPD (chronic obstructive pulmonary disease) (HCC)    Depression    Dyspnea    GERD (gastroesophageal reflux disease)    History of anemia    Hypertension    Lumbar radiculopathy    On home oxygen therapy "6 years ago"   3LPM PRN: "had this 6 years ago," pt no longer has O2 at home (07/01/2017)   Sleep apnea     Past Surgical History:  Procedure Laterality Date   COLONOSCOPY WITH PROPOFOL N/A 07/18/2014   Procedure: COLONOSCOPY WITH PROPOFOL;  Surgeon: Rogene Houston, MD;  Location: [facility];  Service: Endoscopy;  Laterality: N/A;  in cecum at 0753, withdrawal time 20 min   DILATION AND CURETTAGE OF UTERUS  age 3   KNEE ARTHROSCOPY WITH MEDIAL MENISECTOMY Left 10/10/2016   Procedure: KNEE ARTHROSCOPY WITH MEDIAL MENISECTOMY REMOVAL LOSE BODY;  Surgeon: Carole Civil, MD;  Location: [facility];  Service: Orthopedics;  Laterality: Left;   POLYPECTOMY  05/22/2012   Procedure: POLYPECTOMY;  Surgeon: Jonnie Kind, MD;  Location: [facility];  Service: Gynecology;  Laterality: N/A;  Endometrial Polypectomy   POLYPECTOMY N/A 07/18/2014   Procedure: POLYPECTOMY;  Surgeon: Rogene Houston, MD;  Location: [facility];  Service: Endoscopy;  Laterality: N/A;    TOTAL KNEE ARTHROPLASTY Left 02/10/2019   Procedure: LEFT TOTAL KNEE ARTHROPLASTY-CEMENTED;  Surgeon: Marybelle Killings, MD;  Location: [facility];  Service: Orthopedics;  Laterality: Left;   TUBAL LIGATION       Current Outpatient Medications:    albuterol (PROVENTIL) (2.5 MG/3ML) 0.083% nebulizer solution, Take 2.5 mg by nebulization every 6 (six) hours as needed for wheezing or shortness of breath. , Disp: , Rfl:    amLODipine (NORVASC) 10 MG tablet, Take 10 mg by mouth daily., Disp: , Rfl: 11   buPROPion (WELLBUTRIN XL) 300 MG 24 hr tablet, Take 300 mg by mouth every morning., Disp: , Rfl:    busPIRone (BUSPAR) 15 MG tablet, Take 15 mg by mouth 3 (three) times daily., Disp: , Rfl:    clonazePAM (KLONOPIN) 1 MG tablet, Take 1 tablet (1 mg total) by mouth 2 (two) times daily., Disp: , Rfl: 0   doxepin (SINEQUAN) 75 MG capsule, Take 75 mg by mouth at bedtime. , Disp: , Rfl: 2   fluconazole (DIFLUCAN) 100 MG tablet, Take 100 mg by mouth daily. 14 day course starting on 01/26/2020, Disp: , Rfl:    gabapentin (NEURONTIN) 300 MG capsule, Take 300 mg by mouth 2 (two) times daily. , Disp: , Rfl:    HYDROcodone-acetaminophen (NORCO/VICODIN) 5-325 MG tablet, Take 1 tablet by mouth  2 (two) times daily as needed for moderate pain., Disp: 5 tablet, Rfl: 0   metoprolol tartrate (LOPRESSOR) 25 MG tablet, Take 25 mg by mouth 2 (two) times daily., Disp: , Rfl: 6   Multiple Vitamin (MULTIVITAMIN WITH MINERALS) TABS tablet, Take 1 tablet by mouth daily., Disp: , Rfl:    Olopatadine HCl (PAZEO) 0.7 % SOLN, Place 1 drop into both eyes daily., Disp: , Rfl:    omeprazole (PRILOSEC) 20 MG capsule, Take 20 mg by mouth daily., Disp: , Rfl: 3   oxybutynin (DITROPAN-XL) 10 MG 24 hr tablet, Take 10 mg by mouth in the morning and at bedtime. , Disp: , Rfl:    potassium chloride SA (KLOR-CON) 20 MEQ tablet, Take 1 tablet (20 mEq total) by mouth 2 (two) times daily., Disp: 14 tablet, Rfl: 0   predniSONE (DELTASONE) 50 MG tablet, Take  1 tablet (50 mg total) by mouth daily., Disp: 5 tablet, Rfl: 0   risperiDONE (RISPERDAL) 3 MG tablet, Take 3 mg by mouth at bedtime. , Disp: , Rfl:   No Known Allergies        Objective:  Physical Exam  General: AAO x3, NAD  Dermatological: Skin is warm, dry and supple bilateral. There are no open sores, no preulcerative lesions, no rash or signs of infection present.  Vascular: Dorsalis Pedis artery and Posterior Tibial artery pedal pulses are 2/4 bilateral with immedate capillary fill time. There is no pain with calf compression, swelling, warmth, erythema.   Neruologic: Grossly intact via light touch bilateral.  Negative Tinel sign.  Musculoskeletal: Bunions present left foot no tenderness mostly on the first MPJ.  She does get some discomfort to the lesser metatarsophalangeal joints as well.  There is minimal edema of the first MPJ there is no erythema or warmth.  Muscular strength 5/5 in all groups tested bilateral.  Gait: Unassisted, Nonantalgic.       Assessment:   Bunion, capsulitis left foot     Plan:  -Treatment options discussed including all alternatives, risks, and complications -Etiology of symptoms were discussed -X-rays were obtained and reviewed with the patient. 3 views left foot were obtained. Moderate bunions present.  Calcaneal spurring present.  No evidence of acute fracture.  -We discussed different treatment options.  She was to proceed with steroid injection.  Steroid injection was performed of the first MPJ. Skin was cleaned with Betadine, alcohol and a of mixture 1 cc Kenalog 10, 0.5 cc of Marcaine plain, 0.5 cc of lidocain into and around the first MPJ without complications.  Postinjection care discussed.  Tolerated well.  -Discussed supportive shoe gear, topical Voltaren, offloading  Trula Slade DPM

## 2022-04-15 ENCOUNTER — Ambulatory Visit: Payer: Medicaid Other | Admitting: Podiatry

## 2022-05-29 ENCOUNTER — Ambulatory Visit: Payer: Medicaid Other | Admitting: Podiatry

## 2022-06-05 ENCOUNTER — Other Ambulatory Visit: Payer: Self-pay | Admitting: Podiatry

## 2022-06-05 DIAGNOSIS — M21619 Bunion of unspecified foot: Secondary | ICD-10-CM

## 2022-06-10 ENCOUNTER — Ambulatory Visit (INDEPENDENT_AMBULATORY_CARE_PROVIDER_SITE_OTHER): Payer: Medicaid Other | Admitting: Podiatry

## 2022-06-10 ENCOUNTER — Encounter: Payer: Self-pay | Admitting: Podiatry

## 2022-06-10 DIAGNOSIS — M778 Other enthesopathies, not elsewhere classified: Secondary | ICD-10-CM

## 2022-06-10 DIAGNOSIS — M7752 Other enthesopathy of left foot: Secondary | ICD-10-CM

## 2022-06-10 MED ORDER — TRIAMCINOLONE ACETONIDE 10 MG/ML IJ SUSP
10.0000 mg | Freq: Once | INTRAMUSCULAR | Status: AC
  Administered 2022-06-10: 10 mg

## 2022-06-11 NOTE — Progress Notes (Signed)
Subjective:   Patient ID: Tiffany Lynch, female   DOB: 61 y.o.   MRN: [id number]   HPI Patient states she is doing reasonably well in the heel but she is getting some pain around her metatarsals and around the second and third metatarsal phalangeal joint   ROS      Objective:  Physical Exam  Vascular status intact patient does have bunion she may have to have fixed someday but has inflammatory capsulitis currently     Assessment:  Inflammatory capsulitis second and third MPJ left     Plan:  Education rendered sterile prep injected periarticular around the second and third MPJ and instructed on supportive therapy and rigid bottom shoes

## 2022-07-30 ENCOUNTER — Institutional Professional Consult (permissible substitution): Payer: Medicaid Other | Admitting: Pulmonary Disease

## 2022-07-31 ENCOUNTER — Ambulatory Visit (INDEPENDENT_AMBULATORY_CARE_PROVIDER_SITE_OTHER): Payer: Medicaid Other

## 2022-07-31 ENCOUNTER — Ambulatory Visit (INDEPENDENT_AMBULATORY_CARE_PROVIDER_SITE_OTHER): Payer: Medicaid Other | Admitting: Pulmonary Disease

## 2022-07-31 ENCOUNTER — Encounter: Payer: Self-pay | Admitting: Pulmonary Disease

## 2022-07-31 ENCOUNTER — Telehealth: Payer: Self-pay | Admitting: Pulmonary Disease

## 2022-07-31 VITALS — BP 132/84 | HR 84 | Ht 63.0 in | Wt 175.0 lb

## 2022-07-31 DIAGNOSIS — B370 Candidal stomatitis: Secondary | ICD-10-CM | POA: Diagnosis not present

## 2022-07-31 DIAGNOSIS — J410 Simple chronic bronchitis: Secondary | ICD-10-CM | POA: Diagnosis not present

## 2022-07-31 DIAGNOSIS — F17200 Nicotine dependence, unspecified, uncomplicated: Secondary | ICD-10-CM

## 2022-07-31 DIAGNOSIS — J9612 Chronic respiratory failure with hypercapnia: Secondary | ICD-10-CM

## 2022-07-31 DIAGNOSIS — R0600 Dyspnea, unspecified: Secondary | ICD-10-CM

## 2022-07-31 DIAGNOSIS — J189 Pneumonia, unspecified organism: Secondary | ICD-10-CM

## 2022-07-31 MED ORDER — STIOLTO RESPIMAT 2.5-2.5 MCG/ACT IN AERS: 2.0000 | INHALATION_SPRAY | Freq: Every day | RESPIRATORY_TRACT | 5 refills | Status: AC

## 2022-07-31 NOTE — Addendum Note (Signed)
Addended by: Valerie Salts on: 07/31/2022 11:48 AM   Modules accepted: Orders

## 2022-07-31 NOTE — Progress Notes (Signed)
Synopsis: Referred in November 2023 for COPD, hypercapnic respiratory failure  Subjective:   PATIENT ID: Tiffany Lynch GENDER: female DOB: Sep 26, 1960, MRN: [id number]   HPI  Chief Complaint  Patient presents with   Consult    Referred by PCP for double PNA about a month ago. Was discharged on a cpap machine that she received 3 days ago. Was not discharged with oxygen. Still feeling fatigued during the day.     Tiffany Lynch was hospitalized at [facility] for "double pneumonia" and a COPD exacerbation.  She was discharged home and prescribed CPAP (she thinks), though records indicate that she was prescribed a nocturnal ventilator.  She says that her family will find her asleep during the day frequently.  She says taht her family has been checking her oxygen at home and notice that she'll drop to 85% frequently.  She doesn't have supplemental oxygen at home.  She was still smoking 1 ppd. She has tried nicotine patches to help quit.   She follows with Dr. Reece Levy for depression, anxiety.  She has never tried Chantix.  She was feeling more short of breath prior to the admission.  This started a year ago.  She said that her boyfriend died 2 years ago in 09/13/23.  She said that she had been smoking more, as much as 2 packs a day.  She says that she asked her PCP for albuterol nebulized which she now takes 4 times a day.  She says that she has been keeping thrush for over a year.    She says that she has never been told she has COPD, but she has been prescribed Breztri which makes her have thrush.   20 years ago she was diagnosed with sleep apnea and prescribed CPAP with oxygen.  She used it only for a year.  Record review: October 2023 Hospital discharge summary from [facility] reviewed where the patient was hospitalized for acute hypercarbic and hypoxemic respiratory failure in the setting of a COPD exacerbation and community-acquired pneumonia.  She was treated with IV steroids and antibiotics, pCO2 was  noted to be 75 on a blood gas.  BiPAP was used.  She was discharged on 20 mg of prednisone and with an order of nightly BiPAP.  Past Medical History:  Diagnosis Date   Anxiety    Arthritis    Bipolar 1 disorder (HCC)    Chronic back pain    Chronic knee pain    COPD (chronic obstructive pulmonary disease) (HCC)    Depression    Dyspnea    GERD (gastroesophageal reflux disease)    History of anemia    Hypertension    Lumbar radiculopathy    On home oxygen therapy "6 years ago"   3LPM PRN: "had this 6 years ago," pt no longer has O2 at home (07/01/2017)   Sleep apnea      Family History  Problem Relation Age of Onset   Hypertension Mother    Hypertension Brother    Anxiety disorder Brother    Depression Brother      Social History   Socioeconomic History   Marital status: Divorced    Spouse name: Not on file   Number of children: Not on file   Years of education: Not on file   Highest education level: Not on file  Occupational History   Occupation: disabled  Tobacco Use   Smoking status: Every Day    Packs/day: 1.00    Years: 41.00    Total  pack years: 41.00    Types: Cigarettes   Smokeless tobacco: Never  Vaping Use   Vaping Use: Never used  Substance and Sexual Activity   Alcohol use: Yes    Comment: unable to state   Drug use: Yes    Frequency: 3.0 times per week    Types: Marijuana    Comment: history of cocaine and marijuana use, reports no use in about 6 months   Sexual activity: Yes    Birth control/protection: Surgical    Comment: tubes tied  Other Topics Concern   Not on file  Social History Narrative   Not on file   Social Determinants of Health   Financial Resource Strain: Not on file  Food Insecurity: Not on file  Transportation Needs: Not on file  Physical Activity: Not on file  Stress: Not on file  Social Connections: Not on file  Intimate Partner Violence: Not on file     No Known Allergies   Outpatient Medications Prior to  Visit  Medication Sig Dispense Refill   albuterol (PROVENTIL) (2.5 MG/3ML) 0.083% nebulizer solution Take 2.5 mg by nebulization every 6 (six) hours as needed for wheezing or shortness of breath.      amLODipine (NORVASC) 10 MG tablet Take 10 mg by mouth daily.  11   busPIRone (BUSPAR) 15 MG tablet Take 15 mg by mouth 3 (three) times daily.     clonazePAM (KLONOPIN) 1 MG tablet Take 1 tablet (1 mg total) by mouth 2 (two) times daily.  0   cyclobenzaprine (FLEXERIL) 5 MG tablet Take 5 mg by mouth 3 (three) times daily as needed.     doxepin (SINEQUAN) 75 MG capsule Take 75 mg by mouth at bedtime.   2   DULoxetine (CYMBALTA) 30 MG capsule Take 30 mg by mouth daily.     DULoxetine (CYMBALTA) 60 MG capsule Take 60 mg by mouth daily.     gabapentin (NEURONTIN) 600 MG tablet Take 600 mg by mouth 3 (three) times daily.     ibuprofen (ADVIL) 800 MG tablet Take 800 mg by mouth 3 (three) times daily as needed.     LINZESS 72 MCG capsule Take 72 mcg by mouth.     metoprolol tartrate (LOPRESSOR) 25 MG tablet Take 25 mg by mouth 2 (two) times daily.  6   mirtazapine (REMERON) 15 MG tablet Take 15 mg by mouth at bedtime.     Multiple Vitamin (MULTIVITAMIN WITH MINERALS) TABS tablet Take 1 tablet by mouth daily.     nicotine (NICODERM CQ - DOSED IN MG/24 HOURS) 14 mg/24hr patch 14 mg daily.     Olopatadine HCl (PAZEO) 0.7 % SOLN Place 1 drop into both eyes daily.     omeprazole (PRILOSEC) 20 MG capsule Take 20 mg by mouth daily.  3   oxybutynin (DITROPAN-XL) 10 MG 24 hr tablet Take 10 mg by mouth in the morning and at bedtime.      Oxycodone HCl 10 MG TABS Take by mouth.     potassium chloride SA (KLOR-CON) 20 MEQ tablet Take 1 tablet (20 mEq total) by mouth 2 (two) times daily. 14 tablet 0   predniSONE (DELTASONE) 50 MG tablet Take 1 tablet (50 mg total) by mouth daily. 5 tablet 0   QUEtiapine (SEROQUEL) 25 MG tablet Take 25 mg by mouth 2 (two) times daily.     risperiDONE (RISPERDAL) 3 MG tablet Take 3 mg  by mouth at bedtime.      rOPINIRole (REQUIP)  1 MG tablet Take 1 mg by mouth at bedtime.     buPROPion (WELLBUTRIN XL) 300 MG 24 hr tablet Take 300 mg by mouth every morning.     fluconazole (DIFLUCAN) 100 MG tablet Take 100 mg by mouth daily. 14 day course starting on 01/26/2020     gabapentin (NEURONTIN) 300 MG capsule Take 300 mg by mouth 2 (two) times daily.      HYDROcodone-acetaminophen (NORCO/VICODIN) 5-325 MG tablet Take 1 tablet by mouth 2 (two) times daily as needed for moderate pain. 5 tablet 0   No facility-administered medications prior to visit.    Review of Systems  Constitutional:  Positive for malaise/fatigue. Negative for chills, fever and weight loss.  HENT:  Negative for congestion, nosebleeds, sinus pain and sore throat.   Eyes:  Negative for photophobia, pain and discharge.  Respiratory:  Positive for cough, sputum production, shortness of breath and wheezing. Negative for hemoptysis.   Cardiovascular:  Negative for chest pain, palpitations, orthopnea and leg swelling.  Gastrointestinal:  Negative for abdominal pain, constipation, diarrhea, nausea and vomiting.  Genitourinary:  Negative for dysuria, frequency, hematuria and urgency.  Musculoskeletal:  Negative for back pain, joint pain, myalgias and neck pain.  Skin:  Negative for itching and rash.  Neurological:  Negative for tingling, tremors, sensory change, speech change, focal weakness, seizures, weakness and headaches.  Psychiatric/Behavioral:  Negative for memory loss, substance abuse and suicidal ideas. The patient is not nervous/anxious.       Objective:  Physical Exam   Vitals:   07/31/22 1032  BP: 132/84  Pulse: 84  SpO2: 96%  Weight: 175 lb (79.4 kg)  Height: '5\' 3"'$  (1.6 m)    Gen: chronically ill appearing, no acute distress HENT: NCAT, OP with scattered white patches on and under tongue, poor dentition, neck supple without masses Eyes: PERRL, EOMi Lymph: no cervical lymphadenopathy PULM:  Crackles R base, RUL, otherwise clear CV: RRR, no mgr, no JVD GI: BS+, soft, nontender, no hsm Derm: no rash or skin breakdown MSK: normal bulk and tone Neuro: A&Ox4, CN II-XII intact, strength 5/5 in all 4 extremities Psyche: normal mood and affect   CBC    Component Value Date/Time   WBC 13.9 (H) 04/11/2020 1215   RBC 4.15 04/11/2020 1215   HGB 12.4 04/11/2020 1215   HGB 15.4 10/31/2015 1058   HCT 39.3 04/11/2020 1215   HCT 45.2 10/31/2015 1058   PLT 287 04/11/2020 1215   PLT 288 10/31/2015 1058   MCV 94.7 04/11/2020 1215   MCV 86 10/31/2015 1058   MCH 29.9 04/11/2020 1215   MCHC 31.6 04/11/2020 1215   RDW 14.5 04/11/2020 1215   RDW 15.3 10/31/2015 1058   LYMPHSABS 2.1 04/11/2020 1215   LYMPHSABS 3.2 (H) 10/31/2015 1058   MONOABS 1.0 04/11/2020 1215   EOSABS 0.1 04/11/2020 1215   EOSABS 0.2 10/31/2015 1058   BASOSABS 0.0 04/11/2020 1215   BASOSABS 0.0 10/31/2015 1058     Chest imaging: July 05, 2022 CT angiogram chest from [place] independently reviewed showing no pulmonary embolism, pulmonary artery enlargement, right lung consolidation noted upper lobe and lower lobe.  PFT: November 2023 spirometry ratio 56%, FEV1 1.31 L 54% predicted  Labs:  Path:  Echo:  Heart Catheterization:  Ambulatory oximetry: November 2023 walked approximately 220 feet in the office and O2 saturation range between 93 and 95% on room air     Assessment & Plan:   Dyspnea, unspecified type - Plan: Spirometry with  graph  Tobacco dependence  Simple chronic bronchitis (HCC)  Thrush  Chronic respiratory failure with hypercapnia (HCC)  Community acquired pneumonia of right lung, unspecified part of lung  Discussion: Pleasant 61 year old female who still smokes 1 pack of cigarettes daily presents to clinic today for follow-up of acute on chronic respiratory failure with hypercarbia in the setting of community-acquired pneumonia and likely underlying COPD.  She  needs to quit smoking, we need to get spirometry testing, I need to know what type of home ventilator she is using.  I worry about the possibility of an underlying immunocompromising illness given her ongoing thrush for years.  She denies any recent sexual activity but review of records from [facility] shows that she has been tested for STDs in the last 12 months.  For some reason HIV testing is not reported.  Plan: Chronic respiratory failure with hypercapnia: Keep using the home ventilator at night Once you get home, look up the name of the company that provided you the ventilator and call us so that we can get setting information from them We will check an overnight oximetry test on the ventilator to see if you need oxygen with the machine at night  Cigarette smoking: You need to quit Talk to Dr. Reece Levy your psychiatrist about the possibility of using a drug called Chantix We will refer you to lung cancer screening Keep using nicotine replacement  You can call 1-800-quit-NOW to get free nicotine replacement from the state of [place]  Recent severe community-acquired pneumonia: Chest x-ray today  Recurrent thrush: HIV test today  COPD: Spirometry testing today Quit smoking Start Stiolto 2 puffs daily no matter how you feel Keep using albuterol as needed for shortness of breath Ambulatory oximetry monitoring today was normal, no oxygen needed during daytime Practice good hand hygiene Stay physically active  Follow up in 2 months or sooner if needed  Immunizations: Immunization History  Administered Date(s) Administered   Influenza,inj,Quad PF,6+ Mos 08/11/2014, 07/14/2015     Current Outpatient Medications:    albuterol (PROVENTIL) (2.5 MG/3ML) 0.083% nebulizer solution, Take 2.5 mg by nebulization every 6 (six) hours as needed for wheezing or shortness of breath. , Disp: , Rfl:    amLODipine (NORVASC) 10 MG tablet, Take 10 mg by mouth daily., Disp: , Rfl: 11    busPIRone (BUSPAR) 15 MG tablet, Take 15 mg by mouth 3 (three) times daily., Disp: , Rfl:    clonazePAM (KLONOPIN) 1 MG tablet, Take 1 tablet (1 mg total) by mouth 2 (two) times daily., Disp: , Rfl: 0   cyclobenzaprine (FLEXERIL) 5 MG tablet, Take 5 mg by mouth 3 (three) times daily as needed., Disp: , Rfl:    doxepin (SINEQUAN) 75 MG capsule, Take 75 mg by mouth at bedtime. , Disp: , Rfl: 2   DULoxetine (CYMBALTA) 30 MG capsule, Take 30 mg by mouth daily., Disp: , Rfl:    DULoxetine (CYMBALTA) 60 MG capsule, Take 60 mg by mouth daily., Disp: , Rfl:    gabapentin (NEURONTIN) 600 MG tablet, Take 600 mg by mouth 3 (three) times daily., Disp: , Rfl:    ibuprofen (ADVIL) 800 MG tablet, Take 800 mg by mouth 3 (three) times daily as needed., Disp: , Rfl:    LINZESS 72 MCG capsule, Take 72 mcg by mouth., Disp: , Rfl:    metoprolol tartrate (LOPRESSOR) 25 MG tablet, Take 25 mg by mouth 2 (two) times daily., Disp: , Rfl: 6   mirtazapine (REMERON) 15 MG tablet,  Take 15 mg by mouth at bedtime., Disp: , Rfl:    Multiple Vitamin (MULTIVITAMIN WITH MINERALS) TABS tablet, Take 1 tablet by mouth daily., Disp: , Rfl:    nicotine (NICODERM CQ - DOSED IN MG/24 HOURS) 14 mg/24hr patch, 14 mg daily., Disp: , Rfl:    Olopatadine HCl (PAZEO) 0.7 % SOLN, Place 1 drop into both eyes daily., Disp: , Rfl:    omeprazole (PRILOSEC) 20 MG capsule, Take 20 mg by mouth daily., Disp: , Rfl: 3   oxybutynin (DITROPAN-XL) 10 MG 24 hr tablet, Take 10 mg by mouth in the morning and at bedtime. , Disp: , Rfl:    Oxycodone HCl 10 MG TABS, Take by mouth., Disp: , Rfl:    potassium chloride SA (KLOR-CON) 20 MEQ tablet, Take 1 tablet (20 mEq total) by mouth 2 (two) times daily., Disp: 14 tablet, Rfl: 0   predniSONE (DELTASONE) 50 MG tablet, Take 1 tablet (50 mg total) by mouth daily., Disp: 5 tablet, Rfl: 0   QUEtiapine (SEROQUEL) 25 MG tablet, Take 25 mg by mouth 2 (two) times daily., Disp: , Rfl:    risperiDONE (RISPERDAL) 3 MG tablet,  Take 3 mg by mouth at bedtime. , Disp: , Rfl:    rOPINIRole (REQUIP) 1 MG tablet, Take 1 mg by mouth at bedtime., Disp: , Rfl:

## 2022-07-31 NOTE — Patient Instructions (Signed)
Chronic respiratory failure with hypercapnia: Keep using the home ventilator at night Once you get home, look up the name of the company that provided you the ventilator and call us so that we can get setting information from them We will check an overnight oximetry test on the ventilator to see if you need oxygen with the machine at night  Cigarette smoking: You need to quit Talk to Dr. Reece Levy your psychiatrist about the possibility of using a drug called Chantix We will refer you to lung cancer screening Keep using nicotine replacement  You can call 1-800-quit-NOW to get free nicotine replacement from the state of [place]  Recent severe community-acquired pneumonia: Chest x-ray today  Recurrent thrush: HIV test today  COPD: Spirometry testing today Quit smoking Start Stiolto 2 puffs daily no matter how you feel Keep using albuterol as needed for shortness of breath Ambulatory oximetry monitoring today was normal, no oxygen needed during daytime Practice good hand hygiene Stay physically active  Follow up in 2 months or sooner if needed

## 2022-07-31 NOTE — Telephone Encounter (Signed)
Called patient back to verify her machine and that it is a [employer] and that the company is [place]. Called [employer] and was on hold for too long. Will try [employer] again to pull data from machine and to add [place] to be able to look at report. Nothing further needed

## 2022-07-31 NOTE — Telephone Encounter (Signed)
Patient was seen today in clinic by BQ. She had requested a letter that explained she has COPD and is not able to help with her grandchildren as much. She requested the letter after the visit.   BQ wrote and signed the letter. Will place the letter in the mail for patient.

## 2022-07-31 NOTE — Addendum Note (Signed)
Addended by: Valerie Salts on: 07/31/2022 03:38 PM   Modules accepted: Orders

## 2022-08-01 LAB — HIV ANTIBODY (ROUTINE TESTING W REFLEX): HIV 1&2 Ab, 4th Generation: NONREACTIVE

## 2022-08-14 NOTE — Progress Notes (Signed)
Called pt and no answer- LMTCB

## 2022-08-20 ENCOUNTER — Telehealth: Payer: Self-pay

## 2022-08-20 NOTE — Telephone Encounter (Signed)
-----   Message from Juanito Doom, MD sent at 08/14/2022  1:05 PM EST ----- Hi,  Can you call the patient and ask if she was using her home ventilator when she did the overnight oximetry test?   The results showed low oxygen for 10 minutes which in general is very mild.  Thanks, Ruby Cola

## 2022-08-22 NOTE — Telephone Encounter (Signed)
Letter was returned due to insufficient address. Left voicemail for patient to call back to confirm address.

## 2022-09-24 ENCOUNTER — Ambulatory Visit: Payer: Medicaid Other | Admitting: Pulmonary Disease

## 2022-09-24 NOTE — Progress Notes (Deleted)
Synopsis: Referred in November 2023 for COPD, hypercapnic respiratory failure.  On presentation was being treated with home nocturnal noninvasive mechanical ventilation, still smoking cigarettes.  Subjective:   PATIENT ID: Tiffany Lynch GENDER: female DOB: [birth year], MRN: [id number]   HPI  No chief complaint on file.   ***Stiolto, home vent, still smoking?  Past Medical History:  Diagnosis Date   Anxiety    Arthritis    Bipolar 1 disorder (HCC)    Chronic back pain    Chronic knee pain    COPD (chronic obstructive pulmonary disease) (HCC)    Depression    Dyspnea    GERD (gastroesophageal reflux disease)    History of anemia    Hypertension    Lumbar radiculopathy    On home oxygen therapy "6 years ago"   3LPM PRN: "had this 6 years ago," pt no longer has O2 at home (07/01/2017)   Sleep apnea        ROS ***   Objective:  Physical Exam   There were no vitals filed for this visit.  ***   CBC    Component Value Date/Time   WBC 13.9 (H) 04/11/2020 1215   RBC 4.15 04/11/2020 1215   HGB 12.4 04/11/2020 1215   HGB 15.4 10/31/2015 1058   HCT 39.3 04/11/2020 1215   HCT 45.2 10/31/2015 1058   PLT 287 04/11/2020 1215   PLT 288 10/31/2015 1058   MCV 94.7 04/11/2020 1215   MCV 86 10/31/2015 1058   MCH 29.9 04/11/2020 1215   MCHC 31.6 04/11/2020 1215   RDW 14.5 04/11/2020 1215   RDW 15.3 10/31/2015 1058   LYMPHSABS 2.1 04/11/2020 1215   LYMPHSABS 3.2 (H) 10/31/2015 1058   MONOABS 1.0 04/11/2020 1215   EOSABS 0.1 04/11/2020 1215   EOSABS 0.2 10/31/2015 1058   BASOSABS 0.0 04/11/2020 1215   BASOSABS 0.0 10/31/2015 1058     Chest imaging: July 05, 2022 CT angiogram chest from [place] independently reviewed showing no pulmonary embolism, pulmonary artery enlargement, right lung consolidation noted upper lobe and lower lobe. November 2023 chest x-ray 2 view images independently reviewed showing resolution of pulmonary parenchymal  abnormalities with the exception of chronic emphysema  PFT: November 2023 spirometry ratio 56%, FEV1 1.31 L 54% predicted  Labs: November 2023 HIV negative  Path:  Echo:  Heart Catheterization:  Ambulatory oximetry: November 2023 walked approximately 220 feet in the office and O2 saturation range between 93 and 95% on room air     Assessment & Plan:   No diagnosis found.  Discussion: ***  Immunizations: Immunization History  Administered Date(s) Administered   Influenza,inj,Quad PF,6+ Mos 08/11/2014, 07/14/2015     Current Outpatient Medications:    albuterol (PROVENTIL) (2.5 MG/3ML) 0.083% nebulizer solution, Take 2.5 mg by nebulization every 6 (six) hours as needed for wheezing or shortness of breath. , Disp: , Rfl:    amLODipine (NORVASC) 10 MG tablet, Take 10 mg by mouth daily., Disp: , Rfl: 11   busPIRone (BUSPAR) 15 MG tablet, Take 15 mg by mouth 3 (three) times daily., Disp: , Rfl:    clonazePAM (KLONOPIN) 1 MG tablet, Take 1 tablet (1 mg total) by mouth 2 (two) times daily., Disp: , Rfl: 0   cyclobenzaprine (FLEXERIL) 5 MG tablet, Take 5 mg by mouth 3 (three) times daily as needed., Disp: , Rfl:    doxepin (SINEQUAN) 75 MG capsule, Take 75 mg by mouth at bedtime. , Disp: , Rfl: 2  DULoxetine (CYMBALTA) 30 MG capsule, Take 30 mg by mouth daily., Disp: , Rfl:    DULoxetine (CYMBALTA) 60 MG capsule, Take 60 mg by mouth daily., Disp: , Rfl:    gabapentin (NEURONTIN) 600 MG tablet, Take 600 mg by mouth 3 (three) times daily., Disp: , Rfl:    ibuprofen (ADVIL) 800 MG tablet, Take 800 mg by mouth 3 (three) times daily as needed., Disp: , Rfl:    LINZESS 72 MCG capsule, Take 72 mcg by mouth., Disp: , Rfl:    metoprolol tartrate (LOPRESSOR) 25 MG tablet, Take 25 mg by mouth 2 (two) times daily., Disp: , Rfl: 6   mirtazapine (REMERON) 15 MG tablet, Take 15 mg by mouth at bedtime., Disp: , Rfl:    Multiple Vitamin (MULTIVITAMIN WITH MINERALS) TABS tablet, Take 1 tablet by  mouth daily., Disp: , Rfl:    nicotine (NICODERM CQ - DOSED IN MG/24 HOURS) 14 mg/24hr patch, 14 mg daily., Disp: , Rfl:    Olopatadine HCl (PAZEO) 0.7 % SOLN, Place 1 drop into both eyes daily., Disp: , Rfl:    omeprazole (PRILOSEC) 20 MG capsule, Take 20 mg by mouth daily., Disp: , Rfl: 3   oxybutynin (DITROPAN-XL) 10 MG 24 hr tablet, Take 10 mg by mouth in the morning and at bedtime. , Disp: , Rfl:    Oxycodone HCl 10 MG TABS, Take by mouth., Disp: , Rfl:    potassium chloride SA (KLOR-CON) 20 MEQ tablet, Take 1 tablet (20 mEq total) by mouth 2 (two) times daily., Disp: 14 tablet, Rfl: 0   predniSONE (DELTASONE) 50 MG tablet, Take 1 tablet (50 mg total) by mouth daily., Disp: 5 tablet, Rfl: 0   QUEtiapine (SEROQUEL) 25 MG tablet, Take 25 mg by mouth 2 (two) times daily., Disp: , Rfl:    risperiDONE (RISPERDAL) 3 MG tablet, Take 3 mg by mouth at bedtime. , Disp: , Rfl:    rOPINIRole (REQUIP) 1 MG tablet, Take 1 mg by mouth at bedtime., Disp: , Rfl:    Tiotropium Bromide-Olodaterol (STIOLTO RESPIMAT) 2.5-2.5 MCG/ACT AERS, Inhale 2 puffs into the lungs daily., Disp: 4 g, Rfl: 5

## 2022-10-10 DEATH — deceased
